# Patient Record
Sex: Female | Born: 1960 | Race: Black or African American | Hispanic: No | Marital: Married | Smoking: Never smoker
Health system: Southern US, Community
[De-identification: ages and names within clinical notes are randomized; demographics above are authoritative.]

## PROBLEM LIST (undated history)

## (undated) DIAGNOSIS — G40909 Epilepsy, unspecified, not intractable, without status epilepticus: Secondary | ICD-10-CM

## (undated) DIAGNOSIS — F29 Unspecified psychosis not due to a substance or known physiological condition: Secondary | ICD-10-CM

## (undated) DIAGNOSIS — R519 Headache, unspecified: Secondary | ICD-10-CM

## (undated) DIAGNOSIS — G4700 Insomnia, unspecified: Secondary | ICD-10-CM

## (undated) DIAGNOSIS — F419 Anxiety disorder, unspecified: Secondary | ICD-10-CM

## (undated) DIAGNOSIS — F24 Shared psychotic disorder: Secondary | ICD-10-CM

## (undated) DIAGNOSIS — I10 Essential (primary) hypertension: Secondary | ICD-10-CM

## (undated) DIAGNOSIS — F411 Generalized anxiety disorder: Secondary | ICD-10-CM

## (undated) DIAGNOSIS — F329 Major depressive disorder, single episode, unspecified: Secondary | ICD-10-CM

## (undated) DIAGNOSIS — R011 Cardiac murmur, unspecified: Secondary | ICD-10-CM

## (undated) DIAGNOSIS — F32A Depression, unspecified: Secondary | ICD-10-CM

## (undated) HISTORY — DX: Depression, unspecified: F32.A

## (undated) HISTORY — DX: Anxiety disorder, unspecified: F41.9

## (undated) HISTORY — DX: Shared psychotic disorder: F24

## (undated) HISTORY — DX: Generalized anxiety disorder: F41.1

## (undated) HISTORY — PX: ABDOMINAL HYSTERECTOMY: SHX81

## (undated) HISTORY — PX: CHOLECYSTECTOMY: SHX55

## (undated) HISTORY — DX: Insomnia, unspecified: G47.00

## (undated) HISTORY — DX: Major depressive disorder, single episode, unspecified: F32.9

## (undated) HISTORY — DX: Unspecified psychosis not due to a substance or known physiological condition: F29

## (undated) HISTORY — DX: Epilepsy, unspecified, not intractable, without status epilepticus: G40.909

---

## 2000-07-25 ENCOUNTER — Other Ambulatory Visit: Admission: RE | Admit: 2000-07-25 | Discharge: 2000-07-25 | Payer: Self-pay | Admitting: Family Medicine

## 2001-10-03 ENCOUNTER — Ambulatory Visit (HOSPITAL_COMMUNITY): Admission: RE | Admit: 2001-10-03 | Discharge: 2001-10-03 | Payer: Self-pay | Admitting: Family Medicine

## 2001-10-03 ENCOUNTER — Encounter: Payer: Self-pay | Admitting: Family Medicine

## 2002-10-09 ENCOUNTER — Ambulatory Visit (HOSPITAL_COMMUNITY): Admission: RE | Admit: 2002-10-09 | Discharge: 2002-10-09 | Payer: Self-pay | Admitting: Family Medicine

## 2002-10-09 ENCOUNTER — Encounter: Payer: Self-pay | Admitting: Family Medicine

## 2003-10-29 ENCOUNTER — Ambulatory Visit (HOSPITAL_COMMUNITY): Admission: RE | Admit: 2003-10-29 | Discharge: 2003-10-29 | Payer: Self-pay | Admitting: Family Medicine

## 2004-01-22 ENCOUNTER — Ambulatory Visit: Payer: Self-pay | Admitting: Family Medicine

## 2004-03-19 ENCOUNTER — Ambulatory Visit: Payer: Self-pay | Admitting: Family Medicine

## 2004-05-20 ENCOUNTER — Ambulatory Visit: Payer: Self-pay | Admitting: Family Medicine

## 2004-08-19 ENCOUNTER — Ambulatory Visit: Payer: Self-pay | Admitting: Family Medicine

## 2004-09-30 ENCOUNTER — Ambulatory Visit: Payer: Self-pay | Admitting: Family Medicine

## 2004-10-30 ENCOUNTER — Ambulatory Visit (HOSPITAL_COMMUNITY): Admission: RE | Admit: 2004-10-30 | Discharge: 2004-10-30 | Payer: Self-pay | Admitting: Family Medicine

## 2004-12-03 ENCOUNTER — Ambulatory Visit: Payer: Self-pay | Admitting: Family Medicine

## 2005-03-11 ENCOUNTER — Ambulatory Visit: Payer: Self-pay | Admitting: Family Medicine

## 2005-04-13 ENCOUNTER — Ambulatory Visit: Payer: Self-pay | Admitting: Family Medicine

## 2005-06-01 ENCOUNTER — Ambulatory Visit: Payer: Self-pay | Admitting: Family Medicine

## 2005-08-02 ENCOUNTER — Ambulatory Visit: Payer: Self-pay | Admitting: Family Medicine

## 2005-09-24 ENCOUNTER — Ambulatory Visit: Payer: Self-pay | Admitting: Family Medicine

## 2005-12-31 ENCOUNTER — Ambulatory Visit: Payer: Self-pay | Admitting: Family Medicine

## 2006-01-06 ENCOUNTER — Ambulatory Visit (HOSPITAL_COMMUNITY): Admission: RE | Admit: 2006-01-06 | Discharge: 2006-01-06 | Payer: Self-pay | Admitting: Family Medicine

## 2006-02-28 ENCOUNTER — Ambulatory Visit: Payer: Self-pay | Admitting: Family Medicine

## 2006-02-28 ENCOUNTER — Other Ambulatory Visit: Admission: RE | Admit: 2006-02-28 | Discharge: 2006-02-28 | Payer: Self-pay | Admitting: Family Medicine

## 2006-02-28 ENCOUNTER — Encounter: Payer: Self-pay | Admitting: Family Medicine

## 2006-02-28 LAB — CONVERTED CEMR LAB
Cholesterol: 145 mg/dL (ref 0–200)
HDL: 46 mg/dL (ref >39)
LDL Cholesterol: 79 mg/dL (ref 0–99)
Pap Smear: NORMAL
Phenobarbital: 30.3 ug/mL (ref 15.0–40.0)
TSH: 1.106 microintl units/mL (ref 0.350–5.50)

## 2006-05-10 ENCOUNTER — Ambulatory Visit: Payer: Self-pay | Admitting: Family Medicine

## 2006-05-10 LAB — CONVERTED CEMR LAB
Phenobarbital: 35.8 ug/mL (ref 15.0–40.0)
Phenytoin Lvl: 26.3 ug/mL — ABNORMAL HIGH (ref 10.0–20.0)

## 2006-06-24 ENCOUNTER — Encounter: Payer: Self-pay | Admitting: Family Medicine

## 2006-06-24 LAB — CONVERTED CEMR LAB
Phenobarbital: 33.1 ug/mL (ref 15.0–40.0)
Phenytoin Lvl: 19.5 ug/mL (ref 10.0–20.0)

## 2006-09-19 ENCOUNTER — Ambulatory Visit: Payer: Self-pay | Admitting: Family Medicine

## 2006-09-19 LAB — CONVERTED CEMR LAB
AST: 23 units/L (ref 0–37)
Alkaline Phosphatase: 128 units/L — ABNORMAL HIGH (ref 39–117)
Basophils Absolute: 0 10*3/uL (ref 0.0–0.1)
Calcium: 8.9 mg/dL (ref 8.4–10.5)
Chloride: 105 meq/L (ref 96–112)
HCT: 42.9 % (ref 36.0–46.0)
Lymphocytes Relative: 43 % (ref 12–46)
Lymphs Abs: 2.4 10*3/uL (ref 0.7–3.3)
MCHC: 33 g/dL (ref 30.0–36.0)
MCV: 99.4 fL (ref 78.0–100.0)
Monocytes Absolute: 0.8 10*3/uL — ABNORMAL HIGH (ref 0.2–0.7)
Monocytes Relative: 14 % — ABNORMAL HIGH (ref 3–11)
Platelets: 378 10*3/uL (ref 150–400)
RBC: 4.31 M/uL (ref 3.87–5.11)
Total Bilirubin: 0.5 mg/dL (ref 0.3–1.2)
Total Protein: 7.4 g/dL (ref 6.0–8.3)
WBC: 5.6 10*3/uL (ref 4.0–10.5)

## 2006-12-20 ENCOUNTER — Ambulatory Visit: Payer: Self-pay | Admitting: Family Medicine

## 2007-01-10 ENCOUNTER — Ambulatory Visit (HOSPITAL_COMMUNITY): Admission: RE | Admit: 2007-01-10 | Discharge: 2007-01-10 | Payer: Self-pay | Admitting: Family Medicine

## 2007-02-21 ENCOUNTER — Encounter: Payer: Self-pay | Admitting: Family Medicine

## 2007-02-21 DIAGNOSIS — G4700 Insomnia, unspecified: Secondary | ICD-10-CM | POA: Insufficient documentation

## 2007-02-21 DIAGNOSIS — R569 Unspecified convulsions: Secondary | ICD-10-CM | POA: Insufficient documentation

## 2007-02-21 DIAGNOSIS — F341 Dysthymic disorder: Secondary | ICD-10-CM

## 2007-02-21 DIAGNOSIS — F39 Unspecified mood [affective] disorder: Secondary | ICD-10-CM | POA: Insufficient documentation

## 2007-02-28 ENCOUNTER — Ambulatory Visit: Payer: Self-pay | Admitting: Family Medicine

## 2007-02-28 LAB — CONVERTED CEMR LAB
Phenytoin Lvl: 24.1 ug/mL — ABNORMAL HIGH (ref 10.0–20.0)
Troponin I: 0.06 ng/mL — ABNORMAL HIGH (ref <0.06)

## 2007-04-11 ENCOUNTER — Ambulatory Visit: Payer: Self-pay | Admitting: Family Medicine

## 2007-04-12 ENCOUNTER — Encounter: Payer: Self-pay | Admitting: Family Medicine

## 2007-04-12 LAB — CONVERTED CEMR LAB
Albumin: 4.2 g/dL (ref 3.5–5.2)
Alkaline Phosphatase: 126 units/L — ABNORMAL HIGH (ref 39–117)
BUN: 8 mg/dL (ref 6–23)
Basophils Absolute: 0.1 10*3/uL (ref 0.0–0.1)
Basophils Relative: 1 % (ref 0–1)
Bilirubin, Direct: <0.1 mg/dL (ref 0.0–0.3)
Chloride: 106 meq/L (ref 96–112)
Cholesterol: 159 mg/dL (ref 0–200)
Creatinine, Ser: 0.47 mg/dL (ref 0.40–1.20)
Eosinophils Absolute: 0.1 10*3/uL (ref 0.0–0.7)
Eosinophils Relative: 1 % (ref 0–5)
LDL Cholesterol: 93 mg/dL (ref 0–99)
MCV: 100.7 fL — ABNORMAL HIGH (ref 78.0–100.0)
Monocytes Absolute: 0.7 10*3/uL (ref 0.1–1.0)
Neutro Abs: 1.9 10*3/uL (ref 1.7–7.7)
Neutrophils Relative %: 37 % — ABNORMAL LOW (ref 43–77)
Phenytoin Lvl: 21.8 ug/mL — ABNORMAL HIGH (ref 10.0–20.0)
Platelets: 414 10*3/uL — ABNORMAL HIGH (ref 150–400)
Potassium: 4.7 meq/L (ref 3.5–5.3)
Total Protein: 7.8 g/dL (ref 6.0–8.3)
Triglycerides: 106 mg/dL (ref <150)
WBC: 5 10*3/uL (ref 4.0–10.5)

## 2007-07-24 ENCOUNTER — Ambulatory Visit: Payer: Self-pay | Admitting: Family Medicine

## 2007-07-28 ENCOUNTER — Ambulatory Visit (HOSPITAL_COMMUNITY): Admission: RE | Admit: 2007-07-28 | Discharge: 2007-07-28 | Payer: Self-pay | Admitting: Family Medicine

## 2007-09-04 ENCOUNTER — Ambulatory Visit: Payer: Self-pay | Admitting: Family Medicine

## 2007-09-04 ENCOUNTER — Encounter: Payer: Self-pay | Admitting: Family Medicine

## 2007-09-04 DIAGNOSIS — J301 Allergic rhinitis due to pollen: Secondary | ICD-10-CM

## 2007-11-06 ENCOUNTER — Encounter: Payer: Self-pay | Admitting: Family Medicine

## 2007-12-05 ENCOUNTER — Ambulatory Visit: Payer: Self-pay | Admitting: Family Medicine

## 2007-12-06 ENCOUNTER — Telehealth: Payer: Self-pay | Admitting: Family Medicine

## 2007-12-08 ENCOUNTER — Encounter: Payer: Self-pay | Admitting: Family Medicine

## 2007-12-08 LAB — CONVERTED CEMR LAB: Phenytoin Lvl: 13.9 ug/mL (ref 10.0–20.0)

## 2008-01-03 ENCOUNTER — Encounter: Payer: Self-pay | Admitting: Family Medicine

## 2008-01-12 ENCOUNTER — Ambulatory Visit (HOSPITAL_COMMUNITY): Admission: RE | Admit: 2008-01-12 | Discharge: 2008-01-12 | Payer: Self-pay | Admitting: Family Medicine

## 2008-02-13 ENCOUNTER — Ambulatory Visit: Payer: Self-pay | Admitting: Family Medicine

## 2008-02-13 ENCOUNTER — Encounter: Payer: Self-pay | Admitting: Family Medicine

## 2008-02-13 ENCOUNTER — Other Ambulatory Visit: Admission: RE | Admit: 2008-02-13 | Discharge: 2008-02-13 | Payer: Self-pay | Admitting: Family Medicine

## 2008-02-13 DIAGNOSIS — H547 Unspecified visual loss: Secondary | ICD-10-CM | POA: Insufficient documentation

## 2008-02-21 ENCOUNTER — Encounter: Payer: Self-pay | Admitting: Family Medicine

## 2008-04-01 ENCOUNTER — Telehealth: Payer: Self-pay | Admitting: Family Medicine

## 2008-04-04 ENCOUNTER — Encounter: Payer: Self-pay | Admitting: Family Medicine

## 2008-05-06 ENCOUNTER — Ambulatory Visit: Payer: Self-pay | Admitting: Family Medicine

## 2008-05-06 DIAGNOSIS — F319 Bipolar disorder, unspecified: Secondary | ICD-10-CM | POA: Insufficient documentation

## 2008-05-08 ENCOUNTER — Encounter: Payer: Self-pay | Admitting: Family Medicine

## 2008-05-08 LAB — CONVERTED CEMR LAB
Ketones, ur: NEGATIVE mg/dL
Leukocytes, UA: NEGATIVE
Nitrite: NEGATIVE
Protein, ur: NEGATIVE mg/dL
Specific Gravity, Urine: 1.014 (ref 1.005–1.030)
Urobilinogen, UA: 1 (ref 0.0–1.0)
WBC, UA: NONE SEEN cells/hpf (ref <3)

## 2008-05-22 ENCOUNTER — Encounter: Payer: Self-pay | Admitting: Family Medicine

## 2008-05-22 ENCOUNTER — Telehealth: Payer: Self-pay | Admitting: Family Medicine

## 2008-05-23 LAB — CONVERTED CEMR LAB
Basophils Absolute: 0.1 10*3/uL (ref 0.0–0.1)
Basophils Relative: 1 % (ref 0–1)
CO2: 27 meq/L (ref 19–32)
Calcium: 9.5 mg/dL (ref 8.4–10.5)
Eosinophils Absolute: 0.1 10*3/uL (ref 0.0–0.7)
Glucose, Bld: 87 mg/dL (ref 70–99)
LDL Cholesterol: 103 mg/dL — ABNORMAL HIGH (ref 0–99)
MCHC: 32.7 g/dL (ref 30.0–36.0)
MCV: 94.5 fL (ref 78.0–100.0)
Monocytes Absolute: 0.7 10*3/uL (ref 0.1–1.0)
Neutrophils Relative %: 32 % — ABNORMAL LOW (ref 43–77)
Phenobarbital: 35.5 ug/mL (ref 15.0–40.0)
Potassium: 4.8 meq/L (ref 3.5–5.3)
RDW: 13.6 % (ref 11.5–15.5)
Triglycerides: 102 mg/dL (ref <150)
VLDL: 20 mg/dL (ref 0–40)

## 2008-05-28 LAB — CONVERTED CEMR LAB: Phenytoin Lvl: 9.3 ug/mL — ABNORMAL LOW (ref 10.0–20.0)

## 2008-06-17 ENCOUNTER — Ambulatory Visit: Payer: Self-pay | Admitting: Family Medicine

## 2008-06-19 LAB — CONVERTED CEMR LAB: Phenytoin Lvl: 25.6 ug/mL — ABNORMAL HIGH (ref 10.0–20.0)

## 2008-08-29 ENCOUNTER — Telehealth: Payer: Self-pay | Admitting: Family Medicine

## 2008-10-18 ENCOUNTER — Ambulatory Visit: Payer: Self-pay | Admitting: Family Medicine

## 2008-10-28 ENCOUNTER — Encounter: Payer: Self-pay | Admitting: Internal Medicine

## 2008-11-01 ENCOUNTER — Encounter: Payer: Self-pay | Admitting: Family Medicine

## 2008-11-07 ENCOUNTER — Ambulatory Visit: Payer: Self-pay | Admitting: Internal Medicine

## 2008-11-07 ENCOUNTER — Ambulatory Visit (HOSPITAL_COMMUNITY): Admission: RE | Admit: 2008-11-07 | Discharge: 2008-11-07 | Payer: Self-pay | Admitting: Internal Medicine

## 2008-11-07 ENCOUNTER — Encounter: Payer: Self-pay | Admitting: Internal Medicine

## 2008-11-10 ENCOUNTER — Encounter: Payer: Self-pay | Admitting: Internal Medicine

## 2008-11-18 ENCOUNTER — Encounter: Payer: Self-pay | Admitting: Family Medicine

## 2008-12-30 ENCOUNTER — Telehealth: Payer: Self-pay | Admitting: Family Medicine

## 2009-01-13 ENCOUNTER — Ambulatory Visit (HOSPITAL_COMMUNITY): Admission: RE | Admit: 2009-01-13 | Discharge: 2009-01-13 | Payer: Self-pay | Admitting: Family Medicine

## 2009-03-03 ENCOUNTER — Ambulatory Visit: Payer: Self-pay | Admitting: Family Medicine

## 2009-03-04 LAB — CONVERTED CEMR LAB: Phenytoin Lvl: 35.4 ug/mL (ref 10.0–20.0)

## 2009-03-26 ENCOUNTER — Encounter: Payer: Self-pay | Admitting: Family Medicine

## 2009-03-26 LAB — CONVERTED CEMR LAB: Phenytoin Lvl: 26 ug/mL — ABNORMAL HIGH (ref 10.0–20.0)

## 2009-03-28 LAB — CONVERTED CEMR LAB: Phenytoin Lvl: 11.9 ug/mL (ref 10.0–20.0)

## 2009-07-08 ENCOUNTER — Ambulatory Visit: Payer: Self-pay | Admitting: Family Medicine

## 2009-07-08 DIAGNOSIS — F329 Major depressive disorder, single episode, unspecified: Secondary | ICD-10-CM

## 2009-07-08 DIAGNOSIS — F419 Anxiety disorder, unspecified: Secondary | ICD-10-CM | POA: Insufficient documentation

## 2009-07-10 ENCOUNTER — Telehealth: Payer: Self-pay | Admitting: Family Medicine

## 2009-07-10 LAB — CONVERTED CEMR LAB: Phenytoin Lvl: 9 ug/mL — ABNORMAL LOW (ref 10.0–20.0)

## 2009-07-22 ENCOUNTER — Telehealth: Payer: Self-pay | Admitting: Family Medicine

## 2009-07-29 ENCOUNTER — Encounter: Payer: Self-pay | Admitting: Family Medicine

## 2009-07-31 ENCOUNTER — Ambulatory Visit: Payer: Self-pay | Admitting: Family Medicine

## 2009-08-05 LAB — CONVERTED CEMR LAB: Phenytoin Lvl: 4.7 ug/mL — ABNORMAL LOW (ref 10.0–20.0)

## 2009-08-11 LAB — CONVERTED CEMR LAB: Phenytoin Lvl: 12.1 ug/mL (ref 10.0–20.0)

## 2009-08-26 ENCOUNTER — Ambulatory Visit: Payer: Self-pay | Admitting: Family Medicine

## 2009-08-29 LAB — CONVERTED CEMR LAB
Phenobarbital: 30.6 ug/mL (ref 15.0–40.0)
Phenytoin Lvl: 20.6 ug/mL — ABNORMAL HIGH (ref 10.0–20.0)

## 2009-12-10 ENCOUNTER — Ambulatory Visit: Payer: Self-pay | Admitting: Family Medicine

## 2009-12-10 DIAGNOSIS — R51 Headache: Secondary | ICD-10-CM

## 2009-12-10 DIAGNOSIS — R519 Headache, unspecified: Secondary | ICD-10-CM | POA: Insufficient documentation

## 2009-12-11 LAB — CONVERTED CEMR LAB
BUN: 5 mg/dL — ABNORMAL LOW (ref 6–23)
Basophils Absolute: 0 10*3/uL (ref 0.0–0.1)
Calcium: 9.7 mg/dL (ref 8.4–10.5)
Chloride: 99 meq/L (ref 96–112)
Glucose, Bld: 96 mg/dL (ref 70–99)
Hemoglobin: 13.6 g/dL (ref 12.0–15.0)
Lymphocytes Relative: 53 % — ABNORMAL HIGH (ref 12–46)
Lymphs Abs: 2.5 10*3/uL (ref 0.7–4.0)
Monocytes Absolute: 0.6 10*3/uL (ref 0.1–1.0)
Monocytes Relative: 13 % — ABNORMAL HIGH (ref 3–12)
Neutro Abs: 1.6 10*3/uL — ABNORMAL LOW (ref 1.7–7.7)
Neutrophils Relative %: 33 % — ABNORMAL LOW (ref 43–77)
Phenobarbital: 42.7 ug/mL — ABNORMAL HIGH (ref 15.0–40.0)
Platelets: 340 10*3/uL (ref 150–400)
Potassium: 4 meq/L (ref 3.5–5.3)
Triglycerides: 98 mg/dL (ref <150)
WBC: 4.7 10*3/uL (ref 4.0–10.5)

## 2009-12-15 ENCOUNTER — Encounter: Payer: Self-pay | Admitting: Family Medicine

## 2009-12-15 LAB — CONVERTED CEMR LAB: Phenytoin Lvl: 12.7 ug/mL (ref 10.0–20.0)

## 2010-01-16 ENCOUNTER — Ambulatory Visit (HOSPITAL_COMMUNITY)
Admission: RE | Admit: 2010-01-16 | Discharge: 2010-01-16 | Payer: Self-pay | Source: Home / Self Care | Attending: Family Medicine | Admitting: Family Medicine

## 2010-03-05 NOTE — Assessment & Plan Note (Signed)
Summary: office visit   Vital Signs:  Patient profile:   50 year old female Menstrual status:  hysterectomy Height:      63 inches Weight:      123 pounds BMI:     21.87 O2 Sat:      97 % on Room air Pulse rate:   92 / minute Resp:     16 per minute BP sitting:   122 / 80  (left arm)  Vitals Entered By: Mauricia Area CMA (December 10, 2009 2:29 PM)  O2 Flow:  Room air CC: Headache for one week. Tylenol helps sometimes   CC:  Headache for one week. Tylenol helps sometimes.  History of Present Illness: Reports  that she has been doing fairly well. She does report a significantamt of stress because of her sons. Denies recent fever or chills. Denies sinus pressure, nasal congestion , ear pain or sore throat. Denies chest congestion, or cough productive of sputum. Denies chest pain, palpitations, PND, orthopnea or leg swelling. Denies abdominal pain, nausea, vomitting, diarrhea or constipation. Denies change in bowel movements or bloody stool. Denies dysuria , frequency, incontinence or hesitancy. Denies  joint pain, swelling, or reduced mobility. c/o headache and seizures, none recently, she denies vertigo  Denies uncontrolled depression, anxiety or insomnia. Denies  rash, lesions, or itch.     Current Medications (verified): 1)  Clonazepam 0.5 Mg  Tabs (Clonazepam) .... Take One Tablet By Mouth Three Times A Day 2)  Phenobarbital 100 Mg  Tabs (Phenobarbital) .... One Tab By Mouth Two Times A Day 3)  Zyprexa 5 Mg  Tabs (Olanzapine) .... One Tab By Mouth At Bedtime 4)  Cyclobenzaprine Hcl 10 Mg Tabs (Cyclobenzaprine Hcl) .... Take One Tab By Mouth At Bedtime 5)  Dilantin 100 Mg Caps (Phenytoin Sodium Extended) .... One Capsule in The Morning and 2 Capsules At Night  Allergies (verified): 1)  ! Asa  Review of Systems      See HPI General:  Complains of fatigue. Eyes:  Denies discharge, eye pain, and red eye. MS:  Complains of joint pain and stiffness; elbows and  shoulders. Neuro:  Complains of headaches. Psych:  Complains of anxiety and mental problems; denies suicidal thoughts/plans, thoughts of violence, and unusual visions or sounds. Endo:  Denies cold intolerance, excessive hunger, excessive thirst, and excessive urination. Heme:  Denies abnormal bruising and bleeding. Allergy:  Complains of seasonal allergies.  Physical Exam  General:  Well-developed,well-nourished,in no acute distress; alert,appropriate and cooperative throughout examination HEENT: No facial asymmetry,  EOMI, No sinus tenderness, TM's Clear, oropharynx  pink and moist.   Chest: Clear to auscultation bilaterally.  CVS: S1, S2, No murmurs, No S3.   Abd: Soft, Nontender.  MS: Adequate ROM spine, hips, shoulders and knees.  Ext: No edema.   CNS: CN 2-12 intact, power tone and sensation normal throughout.   Skin: Intact, no visible lesions or rashes.  Psych: Good eye contact, normal affect.  Memory intact, not anxious or depressed appearing.    Impression & Recommendations:  Problem # 1:  HEADACHE (ICD-784.0) Assessment Deteriorated  Her updated medication list for this problem includes:    Tylenol Extra Strength 500 Mg Tabs (Acetaminophen) .Marland Kitchen... Take 1 tablet by mouth two times a day as needed for severe headache ,max 6 tabs per week  Orders: ASA 325mg  tab (EMRORAL)  Problem # 2:  DEPRESSION, MILD (ICD-311) Assessment: Unchanged  Her updated medication list for this problem includes:    Clonazepam 0.5 Mg  Tabs (Clonazepam) .Marland Kitchen... Take one tablet by mouth three times a day  Problem # 3:  INSOMNIA UNSPECIFIED (ICD-780.52) Assessment: Improved  Her updated medication list for this problem includes:    Phenobarbital 100 Mg Tabs (Phenobarbital) ..... One tab by mouth two times a day  Discussed sleep hygiene.   Problem # 4:  ANXIETY DEPRESSION (ICD-300.4) Assessment: Unchanged  Orders: Medicare Electronic Prescription [phone])  Problem # 5:  SEIZURE DISORDER  (ICD-780.39) Assessment: Unchanged  Her updated medication list for this problem includes:    Clonazepam 0.5 Mg Tabs (Clonazepam) .Marland Kitchen... Take one tablet by mouth three times a day    Phenobarbital 100 Mg Tabs (Phenobarbital) ..... One tab by mouth two times a day    Dilantin 100 Mg Caps (Phenytoin sodium extended) ..... One capsule in the morning and 2 capsules at night  Orders: Medicare Electronic Prescription [phone]) T-Dilantin (Phenytoin) [phone]) T-Phenobarbital ([phone])  Complete Medication List: 1)  Clonazepam 0.5 Mg Tabs (Clonazepam) .... Take one tablet by mouth three times a day 2)  Phenobarbital 100 Mg Tabs (Phenobarbital) .... One tab by mouth two times a day 3)  Zyprexa 5 Mg Tabs (Olanzapine) .... One tab by mouth at bedtime 4)  Cyclobenzaprine Hcl 10 Mg Tabs (Cyclobenzaprine hcl) .... Take one tab by mouth at bedtime 5)  Dilantin 100 Mg Caps (Phenytoin sodium extended) .... One capsule in the morning and 2 capsules at night 6)  Flonase 50 Mcg/act Susp (Fluticasone propionate) .... 2 puffs twice daily for allergies 7)  Tylenol Extra Strength 500 Mg Tabs (Acetaminophen) .... Take 1 tablet by mouth two times a day as needed for severe headache ,max 6 tabs per week  Other Orders: Influenza Vaccine MCR ([id number]) T-Basic Metabolic Panel [phone]) T-Lipid Profile [phone]) T-CBC w/Diff [phone]) T-TSH [phone]) T-Vitamin D (25-Hydroxy) [phone]) Radiology Referral (Radiology)  Patient Instructions: 1)  CPE January 15 or after. 2)  no med changes at this time 3)  BMP prior to visit, ICD-9: 4)  Lipid Panel prior to visit, ICD-9:  today 5)  TSH prior to visit, ICD-9: 6)  CBC w/ Diff prior to visit, ICD-9: 7)  dilantin level, phenobarbital levels , not stat , vit d 8)  you are referred for a mamogram. Prescriptions: PHENOBARBITAL 100 MG  TABS (PHENOBARBITAL) one tab by mouth two times a day  #60 x 3   Entered by:   Adella Hare LPN   Authorized  by:   Syliva Overman MD   Signed by:   Adella Hare LPN on 31/51/7616   Method used:   Printed then faxed to ...       [facility] [address] (retail)       [address]       [place], [place]  [address]       Ph: [phone] or [phone]       Fax: [phone]   RxID:   [id number] CLONAZEPAM 0.5 MG  TABS (CLONAZEPAM) Take one tablet by mouth three times a day  #90 x 3   Entered by:   Adella Hare LPN   Authorized by:   Syliva Overman MD   Signed by:   Adella Hare LPN on 52/77/8242   Method used:   Printed then faxed to ...       [facility] [address] (retail)       [address]       [place], [place]  [address]       Ph: [phone] or [phone]       Fax: [phone]   RxID:   [id number] DILANTIN 100 MG CAPS (PHENYTOIN SODIUM EXTENDED) one capsule in the morning and 2 capsules at night  #90 x 3   Entered by:   Adella Hare LPN   Authorized by:   Syliva Overman MD   Signed by:   Adella Hare LPN on 47/42/5956   Method used:   Electronically to        [facility] [address] (retail)       [address]       [place], [place]  [address]       Ph: [phone] or [phone]       Fax: [phone]   RxID:   [id number] CYCLOBENZAPRINE HCL 10 MG TABS (CYCLOBENZAPRINE HCL) Take one tab by mouth at bedtime  #30 x 3   Entered by:   Adella Hare LPN   Authorized by:   Syliva Overman MD   Signed by:   Adella Hare LPN on 31/51/7616   Method used:   Electronically to        [facility] [address] (retail)       [address]       [place], [place]  [address]       Ph: [phone] or [phone]       Fax: [phone]   RxID:   [id number] ZYPREXA 5 MG  TABS (OLANZAPINE) one tab by mouth at bedtime  #30 x 3   Entered by:   Adella Hare LPN   Authorized by:   Syliva Overman MD   Signed by:   Adella Hare LPN on  52/77/8242   Method used:   Electronically to        [facility] [address] (retail)       [address]       [place], [place]  [address]       Ph: [phone] or [phone]       Fax: [phone]   RxID:   [id number] TYLENOL EXTRA STRENGTH 500 MG TABS (ACETAMINOPHEN) Take 1 tablet by mouth two times a day as needed for severe headache ,max 6 tabs per week  #30 x 1   Entered and Authorized by:   Syliva Overman MD   Signed by:   Syliva Overman MD on 12/10/2009   Method used:   Electronically to        [facility] [address] (retail)       [address]       [place], [place]  [address]       Ph: [phone] or [phone]       Fax: [phone]   RxID:   [id number] FLONASE 50 MCG/ACT SUSP (FLUTICASONE PROPIONATE) 2 puffs twice daily for allergies  #1 x 2   Entered and Authorized by:   Syliva Overman MD   Signed by:   Syliva Overman MD on 12/10/2009   Method used:   Electronically to        [facility] [address] (retail)       [address]       [place], [place]  [address]       Ph: [phone] or [phone]       Fax: [phone]   RxID:   [id number]    Medication Administration  Medication # 1:    Medication: ASA 325mg  tab    Diagnosis: HEADACHE (ICD-784.0)    Dose: 2 tablets    Route: po    Exp Date: 10/12    Lot #: [id number]    Mfr: gemini pharm    Patient tolerated medication without complications    Given by: Adella Hare LPN (December 10, 2009 3:16 PM)  Orders Added: 1)  Influenza Vaccine MCR [00025] 2)  Est. Patient Level IV [60630] 3)  Medicare Electronic Prescription [G8553] 4)  T-Basic Metabolic Panel [80048-22910] 5)  T-Lipid Profile [80061-22930] 6)  T-CBC w/Diff [16010-93235] 7)  T-TSH [57322-02542] 8)  T-Dilantin (Phenytoin) [70623-76283] 9)  T-Phenobarbital [15176-16073] 10)  T-Vitamin D (25-Hydroxy) [71062-69485] 11)  ASA  325mg  tab [EMRORAL] 12)  Radiology Referral [Radiology]   Immunizations Administered:  Influenza Vaccine:    Vaccine Type: Fluvax MCR    Site: right deltoid    Mfr: novartis    Dose: 0.5 ml    Route: IM    Given by: Mauricia Area CMA    Exp. Date: 06/2010    Lot #: [id number] 5p    VIS given: 08/26/09 version given December 10, 2009.   Immunizations Administered:  Influenza Vaccine:    Vaccine Type: Fluvax MCR    Site: right deltoid    Mfr: novartis    Dose: 0.5 ml    Route: IM    Given by: Mauricia Area CMA    Exp. Date: 06/2010    Lot #: [id number] 5p    VIS given: 08/26/09 version given December 10, 2009.     Medication Administration  Medication # 1:    Medication: ASA 325mg  tab    Diagnosis: HEADACHE (ICD-784.0)    Dose: 2 tablets    Route: po    Exp Date: 10/12    Lot #: [id number]    Mfr: gemini pharm    Patient tolerated medication without complications    Given by: Adella Hare LPN (December 10, 2009 3:16 PM)  Orders Added: 1)  Influenza Vaccine MCR [00025] 2)  Est. Patient Level IV [35009] 3)  Medicare Electronic Prescription [G8553] 4)  T-Basic Metabolic Panel [80048-22910] 5)  T-Lipid Profile [80061-22930] 6)  T-CBC w/Diff [38182-99371] 7)  T-TSH [69678-93810] 8)  T-Dilantin (Phenytoin) [17510-25852] 9)  T-Phenobarbital [77824-23536] 10)  T-Vitamin D (25-Hydroxy) [14431-54008] 11)  ASA 325mg  tab [EMRORAL] 12)  Radiology Referral [Radiology]   Appended Document: office visit correction, ASA not given in office  Tylenol 325mg  was given

## 2010-03-05 NOTE — Miscellaneous (Signed)
Summary: ins card  ins card   Imported By: Lind Guest 07/31/2009 16:05:57  _____________________________________________________________________  External Attachment:    Type:   Image     Comment:   External Document

## 2010-03-05 NOTE — Assessment & Plan Note (Signed)
Summary: office visit   Vital Signs:  Patient profile:   50 year old female Menstrual status:  hysterectomy Height:      63 inches Weight:      120 pounds BMI:     21.33 O2 Sat:      97 % Pulse rate:   99 / minute Pulse rhythm:   regular Resp:     16 per minute BP sitting:   110 / 84 Cuff size:   regular  Vitals Entered By: Everitt Amber LPN (July 08, 4429 3:55 PM) CC: has been feeling lightheaded lately    CC:  has been feeling lightheaded lately .  History of Present Illness: Staggering and imbalance x 1 week. Pt reports continued breakthrough seizures which she always states occur in her sleeop. She denies any recent fever oir chills, head or chest congestion, sore throat or productive cough. She dsenies dysuria or frequency. 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 Shedenies being suicidal or homicidal, she denies hallucinations, she reports poor apetitie. Shedenies any laxcerations assoxiated wih seizures, or increased muscle aches or joint pain.   Current Medications (verified): 1)  Clonazepam 0.5 Mg  Tabs (Clonazepam) .... Take One Tablet By Mouth Three Times A Day 2)  Phenobarbital 100 Mg  Tabs (Phenobarbital) .... One Tab By Mouth Two Times A Day 3)  Dilantin 100 Mg  Caps (Phenytoin Sodium Extended) .... One Cap Every Morning and Two Caps Qhs 4)  Zyprexa 5 Mg  Tabs (Olanzapine) .... One Tab By Mouth At Bedtime 5)  Flonase 50 Mcg/act  Susp (Fluticasone Propionate) .... 2 Puffs Per Nostril Daily 6)  Cyclobenzaprine Hcl 10 Mg Tabs (Cyclobenzaprine Hcl) .... Take One Tab By Mouth At Bedtime  Allergies (verified): 1)  ! Asa  Review of Systems      See HPI Eyes:  Denies blurring and discharge. CV:  told her bp was [place] the [place]. GI:  Denies abdominal pain, constipation, diarrhea, nausea, and vomiting. Neuro:  Complains of headaches, poor balance, and  seizures; denies sensation of room spinning, tingling, and tremors. Psych:  Complains of anxiety and depression; denies suicidal thoughts/plans, thoughts of violence, and unusual visions or sounds. Endo:  Denies cold intolerance, excessive hunger, excessive thirst, excessive urination, heat intolerance, polyuria, and weight change. Heme:  Denies abnormal bruising and bleeding. Allergy:  Denies hives or rash and itching eyes.  Physical Exam  General:  alert, adequately -nourished, and well-hydrated. HEENT: No facial asymmetry,  EOMI, No sinus tenderness, TM's Clear, oropharynx  pink and moist.   Chest: Clear to auscultation bilaterally.  CVS: S1, S2, No murmurs, No S3.   Abd: Soft, Nontender.  MS: Adequate ROM spine, hips, shoulders and knees.  Ext: No edema.   CNS: CN 2-12 intact, power tone and sensation normal throughout.   Skin: Intact, no visible lesions or rashes.  Psych: Good eye contact, normal affect.  Memory intact, mildly  anxious not depressed appearing.      Impression & Recommendations:  Problem # 1:  DEPRESSION, MILD (ICD-311) Assessment Deteriorated  Her updated medication list for this problem includes:    Clonazepam 0.5 Mg Tabs (Clonazepam) .Marland Kitchen... Take one tablet by mouth three times a day    Fluoxetine Hcl 10 Mg Caps (Fluoxetine hcl) .Marland Kitchen... Take 1 capsule by mouth once a day  Problem # 2:  INSOMNIA UNSPECIFIED (ICD-780.52) Assessment: Unchanged  Her  updated medication list for this problem includes:    Phenobarbital 100 Mg Tabs (Phenobarbital) ..... One tab by mouth two times a day  Problem # 3:  SEIZURE DISORDER (ICD-780.39) Assessment: Unchanged  The following medications were removed from the medication list:    Dilantin 100 Mg Caps (Phenytoin sodium extended) ..... One cap every morning and two caps qhs Her updated medication list for this problem includes:    Clonazepam 0.5 Mg Tabs (Clonazepam) .Marland Kitchen... Take one tablet by mouth three times a day     Phenobarbital 100 Mg Tabs (Phenobarbital) ..... One tab by mouth two times a day    Dilantin 100 Mg Caps (Phenytoin sodium extended) .Marland Kitchen..Marland Kitchen Two capsules twice daily  Orders: T-Dilantin (Phenytoin) ([id number]) T-Phenobarbital ([id number])  Problem # 4:  ANXIETY DEPRESSION (ICD-300.4) Assessment: Deteriorated  Complete Medication List: 1)  Clonazepam 0.5 Mg Tabs (Clonazepam) .... Take one tablet by mouth three times a day 2)  Phenobarbital 100 Mg Tabs (Phenobarbital) .... One tab by mouth two times a day 3)  Zyprexa 5 Mg Tabs (Olanzapine) .... One tab by mouth at bedtime 4)  Flonase 50 Mcg/act Susp (Fluticasone propionate) .... 2 puffs per nostril daily 5)  Cyclobenzaprine Hcl 10 Mg Tabs (Cyclobenzaprine hcl) .... Take one tab by mouth at bedtime 6)  Fluoxetine Hcl 10 Mg Caps (Fluoxetine hcl) .... Take 1 capsule by mouth once a day 7)  Dilantin 100 Mg Caps (Phenytoin sodium extended) .... Two capsules twice daily  Patient Instructions: 1)  Please schedule a follow-up appointment in 6 weeks. 2)  Dilantin and phenobarb level today. 3)  You will be started on med for depression Prescriptions: DILANTIN 100 MG CAPS (PHENYTOIN SODIUM EXTENDED) two capsules twice daily  #120 x 3   Entered and Authorized by:   Syliva Overman MD   Signed by:   Syliva Overman MD on 07/09/2009   Method used:   Printed then faxed to ...       [facility] [address] (retail)       [address]       [place], [place]  [address]       Ph: [phone] or [phone]       Fax: [phone]   RxID:   [id number] FLUOXETINE HCL 10 MG CAPS (FLUOXETINE HCL) Take 1 capsule by mouth once a day  #30 x 2   Entered and Authorized by:   Syliva Overman MD   Signed by:   Syliva Overman MD on 07/08/2009   Method used:   Electronically to        [facility] [address] (retail)       [address]       [place], [place]  [address]       Ph:  [phone] or [phone]       Fax: [phone]   RxID:   [id number]

## 2010-03-05 NOTE — Letter (Signed)
Summary: Discharge Summary  Discharge Summary   Imported By: Lind Guest 08/18/2009 14:18:36  _____________________________________________________________________  External Attachment:    Type:   Image     Comment:   External Document

## 2010-03-05 NOTE — Miscellaneous (Signed)
Summary: Orders Update  Clinical Lists Changes  Orders: Added new Test order of T-Dilantin (Phenytoin) ([id number]) - Signed 

## 2010-03-05 NOTE — Assessment & Plan Note (Signed)
Summary: HOSPITAL FOLLOW UP- ROOM 1   Vital Signs:  Patient profile:   50 year old female Menstrual status:  hysterectomy Height:      63 inches Weight:      119.75 pounds BMI:     21.29 O2 Sat:      98 % on Room air Pulse rate:   93 / minute Resp:     16 per minute BP sitting:   120 / 90  (left arm)  Vitals Entered By: Adella Hare LPN (July 31, 2009 2:35 PM) CC: HOSPITAL FOLLOW UP Is Patient Diabetic? No Pain Assessment Patient in pain? no        CC:  HOSPITAL FOLLOW UP.  History of Present Illness: Pt is here for hospital f/u.  She went to [facility] ER Monday 6/27  with nausea, loss of appetite, and dizzines. She  was admitted with Dilantin toxicity .  Pt states she was not taking more Dilantin than rxd.  That her dose was increased the beginning of the mos. She was discharged yesterday.  She is currently not taking Dilantin.  She had labs drawn this am to recheck levels. She states she is feeling better.  Appetitie is nl.  No nausea or vomiting.  No HA or dizziness.  Her muscles were sore yesterday but feel better today. She has not had a seizure recently, and specifically since dischg.   Current Medications (verified): 1)  Clonazepam 0.5 Mg  Tabs (Clonazepam) .... Take One Tablet By Mouth Three Times A Day 2)  Phenobarbital 100 Mg  Tabs (Phenobarbital) .... One Tab By Mouth Two Times A Day 3)  Zyprexa 5 Mg  Tabs (Olanzapine) .... One Tab By Mouth At Bedtime 4)  Flonase 50 Mcg/act  Susp (Fluticasone Propionate) .... 2 Puffs Per Nostril Daily 5)  Cyclobenzaprine Hcl 10 Mg Tabs (Cyclobenzaprine Hcl) .... Take One Tab By Mouth At Bedtime 6)  Dilantin 100 Mg Caps (Phenytoin Sodium Extended) .... Two Capsules Twice Daily 7)  Paroxetine Hcl 10 Mg Tabs (Paroxetine Hcl) .... Take 1 Tablet By Mouth Once A Day  Allergies (verified): 1)  ! Jonne Ply  Past History:  Past medical history reviewed for relevance to current acute and chronic problems.  Past Medical History: Reviewed  history from 02/21/2007 and no changes required. 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 MS:  Denies joint pain, muscle aches, and cramps. Neuro:  Denies numbness, seizures, and tingling.  Physical Exam  General:  Well-developed,well-nourished,in no acute distress; alert,appropriate and cooperative throughout examination Head:  Normocephalic and atraumatic without obvious abnormalities. No apparent alopecia or balding. Ears:  External ear exam shows no significant lesions or deformities.  Otoscopic examination reveals clear canals, tympanic membranes are intact bilaterally without bulging, retraction, inflammation or discharge. Hearing is grossly normal bilaterally. Nose:  External nasal examination shows no deformity or inflammation. Nasal mucosa are pink and moist without lesions or exudates. Mouth:  Oral mucosa and oropharynx without lesions or exudates.   Neck:  No deformities, masses, or tenderness noted. Lungs:  Normal respiratory effort, chest expands symmetrically. Lungs are clear to auscultation, no crackles or wheezes. Heart:  Normal rate and regular rhythm. S1 and S2 normal without gallop, murmur, click, rub or other extra sounds. Cervical Nodes:  No lymphadenopathy noted Psych:  flat affect and subdued.     Impression &  Recommendations:  Problem # 1:  SEIZURE DISORDER (ICD-780.39) Assessment Comment Only  Recent admission for Dilantin toxicity.  Discussed case with Dr Lodema Hong.  Will hold Dilantin until Sat July 2nd, then start one daily.  To have STAT labs drawn Tue July 5th.  Her updated medication list for this problem includes:    Clonazepam 0.5 Mg Tabs (Clonazepam) .Marland Kitchen... Take one tablet by mouth three times a day     Phenobarbital 100 Mg Tabs (Phenobarbital) ..... One tab by mouth two times a day    Dilantin 100 Mg Caps (Phenytoin sodium extended) .Marland Kitchen..Marland Kitchen Two capsules twice daily  Future Orders: T-Dilantin (Phenytoin) ([id number]) ... 08/05/2009  Problem # 2:  BIPOLAR DISORDER UNSPECIFIED (ICD-296.80) Assessment: Comment Only Continue current meds as prescribed.  Complete Medication List: 1)  Clonazepam 0.5 Mg Tabs (Clonazepam) .... Take one tablet by mouth three times a day 2)  Phenobarbital 100 Mg Tabs (Phenobarbital) .... One tab by mouth two times a day 3)  Zyprexa 5 Mg Tabs (Olanzapine) .... One tab by mouth at bedtime 4)  Cyclobenzaprine Hcl 10 Mg Tabs (Cyclobenzaprine hcl) .... Take one tab by mouth at bedtime 5)  Dilantin 100 Mg Caps (Phenytoin sodium extended) .... Two capsules twice daily 6)  Paroxetine Hcl 10 Mg Tabs (Paroxetine hcl) .... Take 1 tablet by mouth once a day  Patient Instructions: 1)  Schedule a follow up appt with Dr Lodema Hong in 4 weeks. 2)  DO NOT TAKE ANY DILATIN UNTIL SATURDAY JULY 2ND.  THEN TAKE ONE DAILY. 3)  HAVE YOUR BLOOD DRAWN TUES MORNING JULY 5TH.  4)  WE WILL THEN ADVISE YOU HOW TO TAKE THE DILANTIN AFTER WE RECEIVE YOUR LAB RESULTS

## 2010-03-05 NOTE — Miscellaneous (Signed)
Summary: Orders Update  Clinical Lists Changes  Orders: Added new Test order of T-Dilantin (Phenytoin) [phone]) - Signed

## 2010-03-05 NOTE — Progress Notes (Signed)
Summary: med making her sick  Phone Note Call from Patient   Summary of Call: pt needs to speak with nurse about medicine that is making her sick. [phone] Initial call taken by: Rudene Anda,  July 22, 2009 8:29 AM  Follow-up for Phone Call        states fluoxetine making her nausea, dizziness, and dry mouth patient wants to discontinue med and try somthing else Follow-up by: Adella Hare LPN,  July 22, 2009 10:39 AM  Additional Follow-up for Phone Call Additional follow up Details #1::        pls advise pt to d/c the med, i will tery one other antidepressant, if she cannot tolerate that she will need psychiatry to treat her depression, pls ensure she understands  Pls call pharmacy and check if paxil is scored and can be broken before faxing she is prescribed half daily i need to know if it cannot be broken Additional Follow-up by: Syliva Overman MD,  July 22, 2009 12:14 PM    Additional Follow-up for Phone Call Additional follow up Details #2::    cannot be broken, controlled release Follow-up by: Adella Hare LPN,  July 22, 2009 4:29 PM  Additional Follow-up for Phone Call Additional follow up Details #3:: Details for Additional Follow-up Action Taken: advise her to take  one daily, pls fax new script with the new directions, cancel other if itwas sent, but i do not think that it was.  pLS write d/c fluoxetine on the new script and ensure pt understands not to take both Additional Follow-up by: Syliva Overman MD,  July 22, 2009 4:52 PM  New/Updated Medications: PAROXETINE HCL 10 MG TABS (PAROXETINE HCL) Takeone half  tablet by mouth once a day' PAROXETINE HCL 10 MG TABS (PAROXETINE HCL) Take 1 tablet by mouth once a day Prescriptions: PAROXETINE HCL 10 MG TABS (PAROXETINE HCL) Take 1 tablet by mouth once a day  #30 x 1   Entered and Authorized by:   Syliva Overman MD   Signed by:   Syliva Overman MD on 07/22/2009   Method used:   Printed then faxed to ...  [facility] [address] (retail)       [address]       [place], [place]  [address]       Ph: [phone] or [phone]       Fax: [phone]   RxID:   [id number] PAROXETINE HCL 10 MG TABS (PAROXETINE HCL) Takeone half  tablet by mouth once a day'  #15 x 1   Entered and Authorized by:   Syliva Overman MD   Signed by:   Syliva Overman MD on 07/22/2009   Method used:   Printed then faxed to ...       [facility] [address] (retail)       [address]       [place], [place]  [address]       Ph: [phone] or [phone]       Fax: [phone]   RxID:   [id number]  called patient, left message Adella Hare LPN  July 22, 2009 4:58 PM called patient, left message Adella Hare LPN  July 23, 2009 10:54 AM called patient, left message, mailed letter Adella Hare LPN  July 24, 2009 8:25 AM  Appended Document: med making her sick patient aware

## 2010-03-05 NOTE — Assessment & Plan Note (Signed)
Summary: office visit   Vital Signs:  Patient profile:   50 year old female Menstrual status:  hysterectomy Height:      63 inches Weight:      127 pounds BMI:     22.58 O2 Sat:      96 % Pulse rate:   64 / minute Pulse rhythm:   regular Resp:     16 per minute BP sitting:   120 / 80 Cuff size:   regular  Vitals Entered ByMarland Kitchen Everitt Amber (March 03, 2009 3:37 PM) CC: Follow up chronic problems   CC:  Follow up chronic problems.  History of Present Illness: Reports  that she has been doing well. Denies recent fever or chills. Denies sinus pressure, nasal congestion , ear pain or sore throat. Denies chest congestion, or cough productive of sputum. Denies chest pain, palpitations, PND, orthopnea or leg swelling. Denies abdominal pain, nausea, vomitting, diarrhea or constipation. Denies change in bowel movements or bloody stool. Denies dysuria , frequency, incontinence or hesitancy. Denies  joint pain, swelling, or reduced mobility. Denies headaches, vertigo, reports seizures approx  approx 2 to4  per month mainly in her sleep, reports mainly an absence type seizure. Denies depression, anxiety or insomnia. Denies  rash, lesions, or itch.     Current Medications (verified): 1)  Clonazepam 0.5 Mg  Tabs (Clonazepam) .... Take One Tablet By Mouth Three Times A Day 2)  Phenobarbital 100 Mg  Tabs (Phenobarbital) .... One Tab By Mouth Two Times A Day 3)  Dilantin 100 Mg  Caps (Phenytoin Sodium Extended) .... One Cap Every Morning and Two Caps Qhs 4)  Zyprexa 5 Mg  Tabs (Olanzapine) .... One Tab By Mouth At Bedtime 5)  Flonase 50 Mcg/act  Susp (Fluticasone Propionate) .... 2 Puffs Per Nostril Daily 6)  Cyclobenzaprine Hcl 10 Mg Tabs (Cyclobenzaprine Hcl) .... Take One Tab By Mouth At Bedtime  Allergies (verified): 1)  ! Asa  Review of Systems      See HPI Eyes:  Denies blurring and discharge. Neuro:  Complains of seizures; denies headaches and sensation of room  spinning. Heme:  Denies abnormal bruising and bleeding. Allergy:  Denies hives or rash and sneezing.  Physical Exam  General:  alert, well-nourished, and well-hydrated. HEENT: No facial asymmetry,  EOMI, No sinus tenderness, TM's Clear, oropharynx  pink and moist.   Chest: Clear to auscultation bilaterally.  CVS: S1, S2, No murmurs, No S3.   Abd: Soft, Nontender.  MS: Adequate ROM spine, hips, shoulders and knees.  Ext: No edema.   CNS: CN 2-12 intact, power tone and sensation normal throughout.   Skin: Intact, no visible lesions or rashes.  Psych: Good eye contact, normal affect.  Memory intact, not anxious or depressed appearing.      Impression & Recommendations:  Problem # 1:  BIPOLAR DISORDER UNSPECIFIED (ICD-296.80) Assessment Improved continue zyprexia  Problem # 2:  SEIZURE DISORDER (ICD-780.39) Assessment: Unchanged  Her updated medication list for this problem includes:    Clonazepam 0.5 Mg Tabs (Clonazepam) .Marland Kitchen... Take one tablet by mouth three times a day    Phenobarbital 100 Mg Tabs (Phenobarbital) ..... One tab by mouth two times a day    Dilantin 100 Mg Caps (Phenytoin sodium extended) ..... One cap every morning and two caps qhs  Orders: T-Dilantin (Phenytoin) ([id number]) T-Phenobarbital ([id number])  Problem # 3:  ANXIETY DEPRESSION (ICD-300.4) Assessment: Improved  Complete Medication List: 1)  Clonazepam 0.5 Mg Tabs (Clonazepam) .... Take one tablet by  mouth three times a day 2)  Phenobarbital 100 Mg Tabs (Phenobarbital) .... One tab by mouth two times a day 3)  Dilantin 100 Mg Caps (Phenytoin sodium extended) .... One cap every morning and two caps qhs 4)  Zyprexa 5 Mg Tabs (Olanzapine) .... One tab by mouth at bedtime 5)  Flonase 50 Mcg/act Susp (Fluticasone propionate) .... 2 puffs per nostril daily 6)  Cyclobenzaprine Hcl 10 Mg Tabs (Cyclobenzaprine hcl) .... Take one tab by mouth at bedtime  Other Orders: Pneumococcal Vaccine ([address]) Admin  1st Vaccine ([address])  Patient Instructions: 1)  Please schedule a follow-up appointment in 4 months. 2)  Dilantin and phenobarb levels today, NOT stat. 3)  no med changes at this time Prescriptions: DILANTIN 100 MG  CAPS (PHENYTOIN SODIUM EXTENDED) one cap every morning and two caps qhs  #90 x 4   Entered by:   Worthy Keeler LPN   Authorized by:   Syliva Overman MD   Signed by:   Worthy Keeler LPN on 09/81/1914   Method used:   Printed then faxed to ...       [facility] [address] (retail)       [address]       [place], [place]  [address]       Ph: [phone] or [phone]       Fax: [phone]   RxID:   [id number] PHENOBARBITAL 100 MG  TABS (PHENOBARBITAL) one tab by mouth two times a day  #60 x 4   Entered by:   Worthy Keeler LPN   Authorized by:   Syliva Overman MD   Signed by:   Worthy Keeler LPN on 63/87/5643   Method used:   Printed then faxed to ...       [facility] [address] (retail)       [address]       [place], [place]  [address]       Ph: [phone] or [phone]       Fax: [phone]   RxID:   [id number] CLONAZEPAM 0.5 MG  TABS (CLONAZEPAM) Take one tablet by mouth three times a day  #90 x 4   Entered by:   Worthy Keeler LPN   Authorized by:   Syliva Overman MD   Signed by:   Worthy Keeler LPN on 60/73/7106   Method used:   Printed then faxed to ...       [facility] [address] (retail)       [address]       [place], [place]  [address]       Ph: [phone] or [phone]       Fax: [phone]   RxID:   [id number]    Immunizations Administered:  Pneumonia Vaccine:    Vaccine Type: Pneumovax    Site: left deltoid    Mfr: Merck    Dose: 0.5 ml    Route: IM    Given by: Worthy Keeler LPN    Exp. Date: 01/11/2010    Lot #: [id number]    VIS given: 08/30/95 version given March 03, 2009.

## 2010-03-05 NOTE — Progress Notes (Signed)
Summary: medicine  Phone Note Call from Patient   Summary of Call: wants you to call her about the medicine and was a message left  call back at [phone] asap Initial call taken by: Lind Guest,  July 10, 2009 9:20 AM  Follow-up for Phone Call        patient aware of the dilantin changes and that it was sent to the pharmacy  Follow-up by: Everitt Amber LPN,  July 10, 1476 11:59 AM

## 2010-03-05 NOTE — Assessment & Plan Note (Signed)
Summary: office visit   Vital Signs:  Patient profile:   50 year old female Menstrual status:  hysterectomy Height:      63 inches Weight:      119 pounds BMI:     21.16 O2 Sat:      98 % Pulse rate:   89 / minute Pulse rhythm:   regular Resp:     16 per minute BP sitting:   120 / 84  (left arm) Cuff size:   regular  Vitals Entered By: Everitt Amber LPN (August 26, 2009 2:27 PM) CC: Follow up chronic problems   CC:  Follow up chronic problems.  History of Present Illness: Reports  that she has been  doing well. Denies recent fever or chills. Denies sinus pressure, nasal congestion , ear pain or sore throat. Denies chest congestion, or cough productive of sputum. Denies chest pain, palpitations, PND, orthopnea or leg swelling. Denies abdominal pain, nausea, vomitting, diarrhea or constipation. Denies change in bowel movements or bloody stool. Denies dysuria , frequency, incontinence or hesitancy. Denies  joint pain, swelling, or reduced mobility. Denies headachesor vertigo,seizures currently controlled on meds, and no symptoms of toxicity.  Denies  rash, lesions, or itch.     Current Medications (verified): 1)  Clonazepam 0.5 Mg  Tabs (Clonazepam) .... Take One Tablet By Mouth Three Times A Day 2)  Phenobarbital 100 Mg  Tabs (Phenobarbital) .... One Tab By Mouth Two Times A Day 3)  Zyprexa 5 Mg  Tabs (Olanzapine) .... One Tab By Mouth At Bedtime 4)  Cyclobenzaprine Hcl 10 Mg Tabs (Cyclobenzaprine Hcl) .... Take One Tab By Mouth At Bedtime 5)  Dilantin 100 Mg Caps (Phenytoin Sodium Extended) .... Two Capsules Twice Daily 6)  Paroxetine Hcl 10 Mg Tabs (Paroxetine Hcl) .... Take 1 Tablet By Mouth Once A Day  Allergies (verified): 1)  ! Asa  Review of Systems      See HPI General:  Complains of fatigue. Eyes:  Denies blurring and discharge. Neuro:  Complains of seizures; needs drug levels checked. Psych:  Complains of anxiety and mental problems; denies depression;  reports that paxil made her feekl weird so she has not been taking it. Endo:  Denies cold intolerance, excessive thirst, excessive urination, and heat intolerance. Heme:  Denies abnormal bruising and bleeding. Allergy:  Complains of seasonal allergies; denies hives or rash and itching eyes.  Physical Exam  General:  Well-developed,well-nourished,in no acute distress; alert,appropriate and cooperative throughout examination HEENT: No facial asymmetry,  EOMI, No sinus tenderness, TM's Clear, oropharynx  pink and moist.   Chest: Clear to auscultation bilaterally.  CVS: S1, S2, No murmurs, No S3.   Abd: Soft, Nontender.  MS: Adequate ROM spine, hips, shoulders and knees.  Ext: No edema.   CNS: CN 2-12 intact, power tone and sensation normal throughout.   Skin: Intact, no visible lesions or rashes.  Psych: Good eye contact, normal affect.  Memory intact, not anxious or depressed appearing.    Impression & Recommendations:  Problem # 1:  DEPRESSION, MILD (ICD-311) Assessment Improved  The following medications were removed from the medication list:    Paroxetine Hcl 10 Mg Tabs (Paroxetine hcl) .Marland Kitchen... Take 1 tablet by mouth once a day Her updated medication list for this problem includes:    Clonazepam 0.5 Mg Tabs (Clonazepam) .Marland Kitchen... Take one tablet by mouth three times a day  Problem # 2:  BIPOLAR DISORDER UNSPECIFIED (ICD-296.80) Assessment: Unchanged continue zyprexia  Problem # 3:  SEIZURE DISORDER (  ICD-780.39) Assessment: Unchanged  Her updated medication list for this problem includes:    Clonazepam 0.5 Mg Tabs (Clonazepam) .Marland Kitchen... Take one tablet by mouth three times a day    Phenobarbital 100 Mg Tabs (Phenobarbital) ..... One tab by mouth two times a day    Dilantin 100 Mg Caps (Phenytoin sodium extended) .Marland Kitchen..Marland Kitchen Two capsules twice daily  Orders: T-Dilantin (Phenytoin) ([id number]) T-Phenobarbital ([id number])  Complete Medication List: 1)  Clonazepam 0.5 Mg Tabs  (Clonazepam) .... Take one tablet by mouth three times a day 2)  Phenobarbital 100 Mg Tabs (Phenobarbital) .... One tab by mouth two times a day 3)  Zyprexa 5 Mg Tabs (Olanzapine) .... One tab by mouth at bedtime 4)  Cyclobenzaprine Hcl 10 Mg Tabs (Cyclobenzaprine hcl) .... Take one tab by mouth at bedtime 5)  Dilantin 100 Mg Caps (Phenytoin sodium extended) .... Two capsules twice daily  Patient Instructions: 1)  Please schedule a follow-up appointment in 3 .5months. 2)  Dilantin and phenobarb levels stat today pls then every month if normal. 3)  Pls start organizing your pills once every week. 4)  Stop Paxil Prescriptions: DILANTIN 100 MG CAPS (PHENYTOIN SODIUM EXTENDED) two capsules twice daily  #120 x 3   Entered by:   Everitt Amber LPN   Authorized by:   Syliva Overman MD   Signed by:   Everitt Amber LPN on 30/86/5784   Method used:   Printed then faxed to ...       [facility] [address] (retail)       [address]       [place], [place]  [address]       Ph: [phone] or [phone]       Fax: [phone]   RxID:   [id number] CYCLOBENZAPRINE HCL 10 MG TABS (CYCLOBENZAPRINE HCL) Take one tab by mouth at bedtime  #30 x 3   Entered by:   Everitt Amber LPN   Authorized by:   Syliva Overman MD   Signed by:   Everitt Amber LPN on 16/60/6301   Method used:   Printed then faxed to ...       [facility] [address] (retail)       [address]       [place], [place]  [address]       Ph: [phone] or [phone]       Fax: [phone]   RxID:   [id number] ZYPREXA 5 MG  TABS (OLANZAPINE) one tab by mouth at bedtime  #30 Each x 3   Entered by:   Everitt Amber LPN   Authorized by:   Syliva Overman MD   Signed by:   Everitt Amber LPN on 27/04/5007   Method used:   Printed then faxed to ...       [facility] [address] (retail)       [address]       [place], [place]   [address]       Ph: [phone] or [phone]       Fax: [phone]   RxID:   [id number] PHENOBARBITAL 100 MG  TABS (PHENOBARBITAL) one tab by mouth two times a day  #60 x 3   Entered by:   Everitt Amber LPN   Authorized by:   Syliva Overman MD   Signed by:   Merry Proud  Hudy LPN on 57/84/6962   Method used:   Printed then faxed to ...       [facility] [address] (retail)       [address]       [place], [place]  [address]       Ph: [phone] or [phone]       Fax: [phone]   RxID:   [id number] CLONAZEPAM 0.5 MG  TABS (CLONAZEPAM) Take one tablet by mouth three times a day  #90 x 3   Entered by:   Everitt Amber LPN   Authorized by:   Syliva Overman MD   Signed by:   Everitt Amber LPN on 63/02/6008   Method used:   Printed then faxed to ...       [facility] [address] (retail)       [address]       [place], [place]  [address]       Ph: [phone] or [phone]       Fax: [phone]   RxID:   [id number]

## 2010-03-18 ENCOUNTER — Encounter: Payer: Self-pay | Admitting: Family Medicine

## 2010-03-18 ENCOUNTER — Other Ambulatory Visit: Payer: Self-pay | Admitting: Family Medicine

## 2010-03-18 ENCOUNTER — Other Ambulatory Visit (HOSPITAL_COMMUNITY)
Admission: RE | Admit: 2010-03-18 | Discharge: 2010-03-18 | Disposition: A | Discharge status: Home / Self Care | Payer: Medicare Other | Source: Ambulatory Visit | Attending: Family Medicine | Admitting: Family Medicine

## 2010-03-18 ENCOUNTER — Encounter (INDEPENDENT_AMBULATORY_CARE_PROVIDER_SITE_OTHER): Payer: Medicare Other | Admitting: Family Medicine

## 2010-03-18 DIAGNOSIS — R569 Unspecified convulsions: Secondary | ICD-10-CM

## 2010-03-18 DIAGNOSIS — Z0000 Encounter for general adult medical examination without abnormal findings: Secondary | ICD-10-CM

## 2010-03-18 DIAGNOSIS — Z124 Encounter for screening for malignant neoplasm of cervix: Secondary | ICD-10-CM

## 2010-03-18 DIAGNOSIS — Z1211 Encounter for screening for malignant neoplasm of colon: Secondary | ICD-10-CM

## 2010-03-19 LAB — CONVERTED CEMR LAB: Phenytoin Lvl: 15.5 ug/mL (ref 10.0–20.0)

## 2010-03-20 ENCOUNTER — Encounter: Payer: Self-pay | Admitting: Family Medicine

## 2010-03-25 NOTE — Letter (Signed)
Summary: Pap Smear, Normal Letter, [facility]  [address]   [place], [place] [address]   Phone: [phone]  Fax: [phone]          March 20, 2010    Dear: Sandra Riley    I am pleased to notify you that your PAP smear was normal.  You will need your next PAP smear in:     ____ 3 Months    ____ 6 Months    ____ 12 Months    Please call the office at our office number above, to schedule your next appointment.    Sincerely,     [facility]

## 2010-03-31 NOTE — Assessment & Plan Note (Signed)
Summary: physical   Vital Signs:  Patient profile:   50 year old female Menstrual status:  hysterectomy Height:      63 inches Weight:      123.50 pounds BMI:     21.96 O2 Sat:      97 % Pulse rate:   94 / minute Resp:     16 per minute BP sitting:   120 / 80  Vitals Entered By: Everitt Amber LPN (March 18, 2010 9:15 AM) CC: CPE   Vision Screening:Left eye with correction: 20 / 40 Right eye with correction: 20 / 50 Both eyes with correction: 20 / 30  Color vision testing: normal     Vision Comments: misplaced her newer glasses  Vision Entered By: Everitt Amber LPN (March 18, 2010 9:21 AM)   CC:  CPE .  History of Present Illness: Reports  that she is doing fairly well. Denies recent fever or chills. Denies sinus pressure, nasal congestion , ear pain or sore throat. Denies chest congestion, or cough productive of sputum. Denies chest pain, palpitations, PND, orthopnea or leg swelling. Denies abdominal pain, nausea, vomitting, diarrhea or constipation. Denies change in bowel movements or bloody stool. Denies dysuria , frequency, incontinence or hesitancy. Denies  joint pain, swelling, or reduced mobility. Denies headaches, vertigo, seizures. reports increased stress, primarily due to  her  financial situation Denies  rash, lesions, or itch.      Current Medications (verified): 1)  Clonazepam 0.5 Mg  Tabs (Clonazepam) .... Take One Tablet By Mouth Three Times A Day 2)  Phenobarbital 100 Mg  Tabs (Phenobarbital) .... One Tab By Mouth Two Times A Day 3)  Zyprexa 5 Mg  Tabs (Olanzapine) .... One Tab By Mouth At Bedtime 4)  Cyclobenzaprine Hcl 10 Mg Tabs (Cyclobenzaprine Hcl) .... Take One Tab By Mouth At Bedtime 5)  Dilantin 100 Mg Caps (Phenytoin Sodium Extended) .... One Capsule in The Morning and 2 Capsules At Night 6)  Flonase 50 Mcg/act Susp (Fluticasone Propionate) .... 2 Puffs Twice Daily For Allergies 7)  Tylenol Extra Strength 500 Mg Tabs (Acetaminophen)  .... Take 1 Tablet By Mouth Two Times A Day As Needed For Severe Headache ,max 6 Tabs Per Week  Allergies (verified): 1)  ! Asa  Review of Systems      See HPI General:  Complains of fatigue. Eyes:  Denies discharge and red eye. Neuro:  Complains of seizures; no recent seizure activityreported. Psych:  Complains of anxiety, depression, and mental problems; denies suicidal thoughts/plans, thoughts of violence, and unusual visions or sounds. Endo:  Denies cold intolerance, excessive hunger, excessive thirst, and excessive urination. Heme:  Denies abnormal bruising, bleeding, and fevers. Allergy:  Complains of seasonal allergies; denies hives or rash and itching eyes; mild.  Physical Exam  General:  Well-developed,well-nourished,in no acute distress; alert,appropriate and cooperative throughout examination Head:  Normocephalic and atraumatic without obvious abnormalities. No apparent alopecia or balding. Eyes:  vision grossly intact, pupils equal, pupils round, and pupils reactive to light.   Ears:  External ear exam shows no significant lesions or deformities.  Otoscopic examination reveals clear canals, tympanic membranes are intact bilaterally without bulging, retraction, inflammation or discharge. Hearing is grossly normal bilaterally. Nose:  External nasal examination shows no deformity or inflammation. Nasal mucosa are pink and moist without lesions or exudates. Mouth:  pharynx pink and moist, fair dentition, and teeth missing.   Neck:  No deformities, masses, or tenderness noted. Chest Wall:  No deformities, masses, or tenderness  noted. Breasts:  No mass, nodules, thickening, tenderness, bulging, retraction, inflamation, nipple discharge or skin changes noted.   Lungs:  Normal respiratory effort, chest expands symmetrically. Lungs are clear to auscultation, no crackles or wheezes. Heart:  Normal rate and regular rhythm. S1 and S2 normal without gallop, murmur, click, rub or other extra  sounds. Abdomen:  Bowel sounds positive,abdomen soft and non-tender without masses, organomegaly or hernias noted. Rectal:  No external abnormalities noted. Normal sphincter tone. No rectal masses or tenderness. Genitalia:  normal introitus, no external lesions, no vaginal discharge, and no adnexal masses or tenderness.Uterus absent   Msk:  No deformity or scoliosis noted of thoracic or lumbar spine.   Pulses:  R and L carotid,radial,femoral,dorsalis pedis and posterior tibial pulses are full and equal bilaterally Extremities:  No clubbing, cyanosis, edema, or deformity noted with normal full range of motion of all joints.   Neurologic:  No cranial nerve deficits noted. Station and gait are normal. Plantar reflexes are down-going bilaterally. DTRs are symmetrical throughout. Sensory, motor and coordinative functions appear intact. Skin:  Intact without suspicious lesions or rashes Cervical Nodes:  No lymphadenopathy noted Axillary Nodes:  No palpable lymphadenopathy Inguinal Nodes:  No significant adenopathy Psych:  Cognition and judgment appear intact. Alert and cooperative with normal attention span and concentration. No apparent delusions, illusions, hallucinations   Impression & Recommendations:  Problem # 1:  DEPRESSION, MILD (ICD-311) Assessment Unchanged  Her updated medication list for this problem includes:    Clonazepam 0.5 Mg Tabs (Clonazepam) .Marland Kitchen... Take one tablet by mouth three times a day  Problem # 2:  SEIZURE DISORDER (ICD-780.39) Assessment: Improved  Her updated medication list for this problem includes:    Clonazepam 0.5 Mg Tabs (Clonazepam) .Marland Kitchen... Take one tablet by mouth three times a day    Phenobarbital 100 Mg Tabs (Phenobarbital) ..... One tab by mouth two times a day    Dilantin 100 Mg Caps (Phenytoin sodium extended) ..... One capsule in the morning and 2 capsules at night  Orders: Medicare Electronic Prescription [phone]) T-Dilantin (Phenytoin)  [phone]) T-Phenobarbital ([id number])  Problem # 3:  INSOMNIA UNSPECIFIED (ICD-780.52) Assessment: Improved  Her updated medication list for this problem includes:    Phenobarbital 100 Mg Tabs (Phenobarbital) ..... One tab by mouth two times a day  Discussed sleep hygiene.   Problem # 4:  PHYSICAL EXAMINATION (ICD-V70.0) pap sent, rectal exam revealed no mass and giuaic neg stool  Problem # 5:  BIPOLAR DISORDER UNSPECIFIED (ICD-296.80) Assessment: Unchanged continue zyprexia  Problem # 6:  ALLERGIC RHINITIS, SEASONAL (ICD-477.0) Assessment: Improved flonase as needed  Complete Medication List: 1)  Clonazepam 0.5 Mg Tabs (Clonazepam) .... Take one tablet by mouth three times a day 2)  Phenobarbital 100 Mg Tabs (Phenobarbital) .... One tab by mouth two times a day 3)  Zyprexa 5 Mg Tabs (Olanzapine) .... One tab by mouth at bedtime 4)  Cyclobenzaprine Hcl 10 Mg Tabs (Cyclobenzaprine hcl) .... Take one tab by mouth at bedtime 5)  Dilantin 100 Mg Caps (Phenytoin sodium extended) .... One capsule in the morning and 2 capsules at night 6)  Flonase 50 Mcg/act Susp (Fluticasone propionate) .... 2 puffs twice daily for allergies 7)  Tylenol Extra Strength 500 Mg Tabs (Acetaminophen) .... Take 1 tablet by mouth two times a day as needed for severe headache ,max 6 tabs per week  Other Orders: Hemoccult Guaiac-1 spec.(in office) ([id number]) Pap Smear ([id number])  Patient Instructions: 1)  Please schedule a follow-up appointment in 4  months. 2)  Phenobarb and dilantin levels today. 3)  All the best, no med changes.   Orders Added: 1)  Est. Patient 40-64 years [[id number]] 2)  Medicare Electronic Prescription [[id number]] 3)  T-Dilantin (Phenytoin) [[id number]] 4)  T-Phenobarbital [[id number]] 5)  Hemoccult Guaiac-1 spec.(in office) [[id number]] 6)  Pap Smear [[id number]]     Laboratory Results  Date/Time Received: March 18, 2010 9:42 AM  Date/Time Reported: March 18, 2010 9:42  AM   Stool - Occult Blood Hemmoccult #1: negative Date: 03/18/2010 Comments: 51301 13L 10/13 5030 5/14 Adella Hare LPN  March 18, 2010 9:43 AM

## 2010-04-24 ENCOUNTER — Telehealth: Payer: Self-pay | Admitting: Family Medicine

## 2010-04-24 NOTE — Telephone Encounter (Signed)
pls advise pt she needs to have pharmacy send refill request, also refill x 4 pls

## 2010-04-24 NOTE — Telephone Encounter (Signed)
Nursing has sent in meds

## 2010-04-27 ENCOUNTER — Encounter: Payer: Self-pay | Admitting: Family Medicine

## 2010-06-01 ENCOUNTER — Telehealth: Payer: Self-pay | Admitting: Family Medicine

## 2010-06-01 ENCOUNTER — Telehealth (INDEPENDENT_AMBULATORY_CARE_PROVIDER_SITE_OTHER): Payer: Medicare Other | Admitting: Family Medicine

## 2010-06-01 DIAGNOSIS — R569 Unspecified convulsions: Secondary | ICD-10-CM

## 2010-06-01 MED ORDER — PHENYTOIN SODIUM EXTENDED 100 MG PO CAPS: 100.0000 mg | ORAL_CAPSULE | Freq: Three times a day (TID) | ORAL | Status: DC

## 2010-06-01 NOTE — Telephone Encounter (Signed)
Med sent as requested 

## 2010-06-01 NOTE — Telephone Encounter (Signed)
Form completed, pls scan and call her to collect

## 2010-06-02 NOTE — Telephone Encounter (Signed)
Patient aware and will pick up the paper

## 2010-07-15 ENCOUNTER — Encounter: Payer: Self-pay | Admitting: Family Medicine

## 2010-07-17 ENCOUNTER — Encounter: Payer: Self-pay | Admitting: Family Medicine

## 2010-07-21 ENCOUNTER — Ambulatory Visit: Payer: Medicare Other | Admitting: Family Medicine

## 2010-07-31 ENCOUNTER — Other Ambulatory Visit: Payer: Self-pay

## 2010-07-31 MED ORDER — PHENYTOIN SODIUM EXTENDED 100 MG PO CAPS: 100.0000 mg | ORAL_CAPSULE | Freq: Three times a day (TID) | ORAL | Status: DC

## 2010-08-25 ENCOUNTER — Other Ambulatory Visit: Payer: Self-pay

## 2010-08-25 MED ORDER — CLONAZEPAM 0.5 MG PO TABS: ORAL_TABLET | ORAL | Status: DC

## 2010-08-31 ENCOUNTER — Telehealth: Payer: Self-pay | Admitting: Family Medicine

## 2010-08-31 MED ORDER — PHENOBARBITAL 100 MG PO TABS: 100.0000 mg | ORAL_TABLET | Freq: Two times a day (BID) | ORAL | Status: DC

## 2010-08-31 NOTE — Telephone Encounter (Signed)
Pt called, she is out of phenobarbital, has history of epilepsy on 2 seizure meds, discussed with her she needs to call during business hours for this , will refill today, next appt Aug 13th

## 2010-09-14 ENCOUNTER — Encounter: Payer: Self-pay | Admitting: Family Medicine

## 2010-09-14 ENCOUNTER — Encounter: Payer: Self-pay | Admitting: *Deleted

## 2010-09-14 ENCOUNTER — Ambulatory Visit (INDEPENDENT_AMBULATORY_CARE_PROVIDER_SITE_OTHER): Payer: Medicare Other | Admitting: Family Medicine

## 2010-09-14 VITALS — BP 120/80 | HR 97 | Resp 14 | Ht 63.0 in | Wt 112.1 lb

## 2010-09-14 DIAGNOSIS — Z23 Encounter for immunization: Secondary | ICD-10-CM

## 2010-09-14 DIAGNOSIS — J301 Allergic rhinitis due to pollen: Secondary | ICD-10-CM

## 2010-09-14 DIAGNOSIS — F329 Major depressive disorder, single episode, unspecified: Secondary | ICD-10-CM

## 2010-09-14 DIAGNOSIS — R569 Unspecified convulsions: Secondary | ICD-10-CM

## 2010-09-14 DIAGNOSIS — F341 Dysthymic disorder: Secondary | ICD-10-CM

## 2010-09-14 MED ORDER — OLANZAPINE 5 MG PO TABS: 5.0000 mg | ORAL_TABLET | Freq: Every day | ORAL | Status: DC

## 2010-09-14 MED ORDER — PHENOBARBITAL 100 MG PO TABS: 100.0000 mg | ORAL_TABLET | Freq: Two times a day (BID) | ORAL | Status: DC

## 2010-09-14 MED ORDER — CYCLOBENZAPRINE HCL 10 MG PO TABS: 10.0000 mg | ORAL_TABLET | Freq: Every day | ORAL | Status: DC

## 2010-09-14 MED ORDER — PHENYTOIN SODIUM EXTENDED 100 MG PO CAPS: 100.0000 mg | ORAL_CAPSULE | Freq: Three times a day (TID) | ORAL | Status: DC

## 2010-09-14 NOTE — Patient Instructions (Addendum)
F/U in 3 months.  Lab today.   TdaP today.  Your blood pressure is good.  I hope you feel less anxious and stressed soon, and remember you can call mental health for therapy if you feel the need to,
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
## 2010-09-15 LAB — PHENYTOIN LEVEL, TOTAL: Phenytoin Lvl: 16.6 ug/mL (ref 10.0–20.0)

## 2010-09-15 LAB — PHENOBARBITAL LEVEL: Phenobarbital: 41.4 ug/mL — ABNORMAL HIGH (ref 15.0–40.0)

## 2010-09-21 NOTE — Assessment & Plan Note (Signed)
Increased and uncontrolled due to psychosocial stress, no med change at this time

## 2010-09-21 NOTE — Assessment & Plan Note (Signed)
Uncontrolled, due to recent increased personal stress

## 2010-09-21 NOTE — Assessment & Plan Note (Signed)
Resume flonase as symptoms are uncontrolled currently

## 2010-09-21 NOTE — Assessment & Plan Note (Signed)
Deteriorated, advised to call mental health if needed

## 2010-10-28 ENCOUNTER — Other Ambulatory Visit: Payer: Self-pay | Admitting: *Deleted

## 2010-10-28 ENCOUNTER — Telehealth: Payer: Self-pay | Admitting: Family Medicine

## 2010-10-28 MED ORDER — CLONAZEPAM 0.5 MG PO TABS: ORAL_TABLET | ORAL | Status: DC

## 2010-10-28 NOTE — Telephone Encounter (Signed)
MEDICATION REFILL SENT

## 2010-12-14 ENCOUNTER — Encounter: Payer: Self-pay | Admitting: Family Medicine

## 2010-12-15 ENCOUNTER — Other Ambulatory Visit: Payer: Self-pay | Admitting: Family Medicine

## 2010-12-15 ENCOUNTER — Ambulatory Visit (INDEPENDENT_AMBULATORY_CARE_PROVIDER_SITE_OTHER): Payer: Medicare Other | Admitting: Family Medicine

## 2010-12-15 ENCOUNTER — Encounter: Payer: Self-pay | Admitting: Family Medicine

## 2010-12-15 VITALS — BP 130/82 | HR 76 | Resp 16 | Ht 63.0 in | Wt 112.0 lb

## 2010-12-15 DIAGNOSIS — R5381 Other malaise: Secondary | ICD-10-CM

## 2010-12-15 DIAGNOSIS — F319 Bipolar disorder, unspecified: Secondary | ICD-10-CM

## 2010-12-15 DIAGNOSIS — F329 Major depressive disorder, single episode, unspecified: Secondary | ICD-10-CM

## 2010-12-15 DIAGNOSIS — R569 Unspecified convulsions: Secondary | ICD-10-CM

## 2010-12-15 DIAGNOSIS — J301 Allergic rhinitis due to pollen: Secondary | ICD-10-CM

## 2010-12-15 DIAGNOSIS — G40909 Epilepsy, unspecified, not intractable, without status epilepticus: Secondary | ICD-10-CM

## 2010-12-15 DIAGNOSIS — M949 Disorder of cartilage, unspecified: Secondary | ICD-10-CM

## 2010-12-15 DIAGNOSIS — E559 Vitamin D deficiency, unspecified: Secondary | ICD-10-CM

## 2010-12-15 DIAGNOSIS — R5383 Other fatigue: Secondary | ICD-10-CM

## 2010-12-15 DIAGNOSIS — F341 Dysthymic disorder: Secondary | ICD-10-CM

## 2010-12-15 DIAGNOSIS — M899 Disorder of bone, unspecified: Secondary | ICD-10-CM

## 2010-12-15 DIAGNOSIS — Z79899 Other long term (current) drug therapy: Secondary | ICD-10-CM

## 2010-12-15 DIAGNOSIS — Z139 Encounter for screening, unspecified: Secondary | ICD-10-CM

## 2010-12-15 DIAGNOSIS — G4700 Insomnia, unspecified: Secondary | ICD-10-CM

## 2010-12-15 DIAGNOSIS — Z23 Encounter for immunization: Secondary | ICD-10-CM

## 2010-12-15 NOTE — Patient Instructions (Addendum)
F/u  In 4 .5 months.  Mammogram will be scheduled   Labs today.  Your weight is healthy  Flu vaccine today
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
## 2010-12-16 LAB — LIPID PANEL
HDL: 46 mg/dL (ref >39)
Total CHOL/HDL Ratio: 3.5 Ratio
Triglycerides: 82 mg/dL (ref <150)

## 2010-12-16 LAB — CBC WITH DIFFERENTIAL/PLATELET
Basophils Absolute: 0 10*3/uL (ref 0.0–0.1)
Eosinophils Absolute: 0 10*3/uL (ref 0.0–0.7)
Eosinophils Relative: 1 % (ref 0–5)
Lymphocytes Relative: 47 % — ABNORMAL HIGH (ref 12–46)
Lymphs Abs: 2.1 10*3/uL (ref 0.7–4.0)
MCV: 99.8 fL (ref 78.0–100.0)
Neutrophils Relative %: 38 % — ABNORMAL LOW (ref 43–77)
Platelets: 331 10*3/uL (ref 150–400)
RBC: 4.22 MIL/uL (ref 3.87–5.11)
RDW: 12.5 % (ref 11.5–15.5)
WBC: 4.4 10*3/uL (ref 4.0–10.5)

## 2010-12-16 LAB — BASIC METABOLIC PANEL
Calcium: 9.8 mg/dL (ref 8.4–10.5)
Chloride: 101 mEq/L (ref 96–112)
Creat: 0.55 mg/dL (ref 0.50–1.10)
Sodium: 138 mEq/L (ref 135–145)

## 2010-12-16 LAB — PHENOBARBITAL LEVEL: Phenobarbital: 34.3 ug/mL (ref 15.0–40.0)

## 2010-12-16 MED ORDER — VITAMIN D (ERGOCALCIFEROL) 1.25 MG (50000 UNIT) PO CAPS: 50000.0000 [IU] | ORAL_CAPSULE | ORAL | Status: DC

## 2010-12-20 DIAGNOSIS — E559 Vitamin D deficiency, unspecified: Secondary | ICD-10-CM | POA: Insufficient documentation

## 2010-12-20 NOTE — Assessment & Plan Note (Signed)
Continue med as before

## 2010-12-20 NOTE — Assessment & Plan Note (Signed)
Uncontrolled by history, will check drug levels in the blood

## 2010-12-20 NOTE — Assessment & Plan Note (Signed)
Reports increased symptoms which is not uncommon during the fall, flonase prescribed

## 2010-12-20 NOTE — Assessment & Plan Note (Signed)
Prescription sent for once weekly supplement

## 2010-12-20 NOTE — Assessment & Plan Note (Signed)
Sleep hygiene discussed 
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
## 2011-01-15 ENCOUNTER — Other Ambulatory Visit: Payer: Self-pay | Admitting: Family Medicine

## 2011-01-15 MED ORDER — CYCLOBENZAPRINE HCL 10 MG PO TABS: 10.0000 mg | ORAL_TABLET | Freq: Every day | ORAL | Status: DC

## 2011-01-15 MED ORDER — OLANZAPINE 5 MG PO TABS: 5.0000 mg | ORAL_TABLET | Freq: Every day | ORAL | Status: DC

## 2011-01-15 MED ORDER — FLUTICASONE PROPIONATE 50 MCG/ACT NA SUSP: 2.0000 | Freq: Every day | NASAL | Status: DC

## 2011-01-15 MED ORDER — PHENOBARBITAL 100 MG PO TABS: 100.0000 mg | ORAL_TABLET | Freq: Two times a day (BID) | ORAL | Status: DC

## 2011-01-15 MED ORDER — PHENYTOIN SODIUM EXTENDED 100 MG PO CAPS: 100.0000 mg | ORAL_CAPSULE | Freq: Three times a day (TID) | ORAL | Status: DC

## 2011-01-18 ENCOUNTER — Other Ambulatory Visit: Payer: Self-pay

## 2011-01-18 MED ORDER — CLONAZEPAM 0.5 MG PO TABS: ORAL_TABLET | ORAL | Status: DC

## 2011-01-19 ENCOUNTER — Ambulatory Visit (HOSPITAL_COMMUNITY)
Admission: RE | Admit: 2011-01-19 | Discharge: 2011-01-19 | Disposition: A | Discharge status: Home / Self Care | Payer: Medicare Other | Source: Ambulatory Visit | Attending: Family Medicine | Admitting: Family Medicine

## 2011-01-19 DIAGNOSIS — Z1231 Encounter for screening mammogram for malignant neoplasm of breast: Secondary | ICD-10-CM | POA: Insufficient documentation

## 2011-01-19 DIAGNOSIS — Z139 Encounter for screening, unspecified: Secondary | ICD-10-CM

## 2011-05-11 ENCOUNTER — Ambulatory Visit (INDEPENDENT_AMBULATORY_CARE_PROVIDER_SITE_OTHER): Payer: Medicare Other | Admitting: Family Medicine

## 2011-05-11 ENCOUNTER — Encounter: Payer: Self-pay | Admitting: Family Medicine

## 2011-05-11 VITALS — BP 110/70 | HR 72 | Resp 16 | Ht 63.0 in | Wt 111.1 lb

## 2011-05-11 DIAGNOSIS — J301 Allergic rhinitis due to pollen: Secondary | ICD-10-CM

## 2011-05-11 DIAGNOSIS — R569 Unspecified convulsions: Secondary | ICD-10-CM

## 2011-05-11 DIAGNOSIS — L680 Hirsutism: Secondary | ICD-10-CM

## 2011-05-11 DIAGNOSIS — F3289 Other specified depressive episodes: Secondary | ICD-10-CM

## 2011-05-11 DIAGNOSIS — E559 Vitamin D deficiency, unspecified: Secondary | ICD-10-CM | POA: Diagnosis not present

## 2011-05-11 DIAGNOSIS — Z1211 Encounter for screening for malignant neoplasm of colon: Secondary | ICD-10-CM | POA: Diagnosis not present

## 2011-05-11 DIAGNOSIS — L678 Other hair color and hair shaft abnormalities: Secondary | ICD-10-CM

## 2011-05-11 DIAGNOSIS — F329 Major depressive disorder, single episode, unspecified: Secondary | ICD-10-CM

## 2011-05-11 HISTORY — DX: Other hair color and hair shaft abnormalities: L67.8

## 2011-05-11 LAB — POC HEMOCCULT BLD/STL (OFFICE/1-CARD/DIAGNOSTIC): Fecal Occult Blood, POC: NEGATIVE

## 2011-05-11 MED ORDER — OLANZAPINE 5 MG PO TABS: 5.0000 mg | ORAL_TABLET | Freq: Every day | ORAL | Status: DC

## 2011-05-11 MED ORDER — PHENYTOIN SODIUM EXTENDED 100 MG PO CAPS: 100.0000 mg | ORAL_CAPSULE | Freq: Three times a day (TID) | ORAL | Status: DC

## 2011-05-11 MED ORDER — PHENOBARBITAL 100 MG PO TABS: 100.0000 mg | ORAL_TABLET | Freq: Two times a day (BID) | ORAL | Status: DC

## 2011-05-11 MED ORDER — CLONAZEPAM 0.5 MG PO TABS: ORAL_TABLET | ORAL | Status: DC

## 2011-05-11 MED ORDER — EFLORNITHINE HCL 13.9 % EX CREA: TOPICAL_CREAM | CUTANEOUS | Status: DC

## 2011-05-11 MED ORDER — CYCLOBENZAPRINE HCL 10 MG PO TABS: 10.0000 mg | ORAL_TABLET | Freq: Every day | ORAL | Status: DC

## 2011-05-11 NOTE — Progress Notes (Signed)
  Subjective:    Patient ID: Sandra Riley, female    DOB: 1960/10/26, 51 y.o.   MRN: [id number]  HPI The PT is here for follow up and re-evaluation of chronic medical conditions, medication management and review of any available recent lab and radiology data.  Preventive health is updated, specifically  Cancer screening and Immunization.   Questions or concerns regarding consultations or procedures which the PT has had in the interim are  addressed. The PT denies any adverse reactions to current medications since the last visit.  There are no new concerns.  There are no specific complaints       Review of Systems    See HPI Denies recent fever or chills. C/o increased   nasal congestion,which is expected at this time of the year, denies sinus pressure, ear pain or sore throat. Denies chest congestion, productive cough or wheezing. Denies chest pains, palpitations and leg swelling Denies abdominal pain, nausea, vomiting,diarrhea or constipation.   Denies dysuria, frequency, hesitancy or incontinence. Denies joint pain, swelling and limitation in mobility. Denies headaches,  numbness, or tingling.Stillexperiencing breakthrough seizures Denies uncontrolled  depression, anxiety or insomnia. Denies skin break down or rash.     Objective:   Physical Exam Patient alert and oriented and in no cardiopulmonary distress.  HEENT: No facial asymmetry, EOMI, no sinus tenderness,  oropharynx pink and moist.  Neck supple no adenopathy.Erythem and edema of nasal mucosa  Chest: Clear to auscultation bilaterally.  CVS: S1, S2 no murmurs, no S3.  ABD: Soft non tender. Bowel sounds normal.  Ext: No edema  MS: Adequate ROM spine, shoulders, hips and knees.  Skin: Intact, no ulcerations or rash noted.  Psych: Good eye contact, normal affect. Memory intact not anxious or depressed appearing.  CNS: CN 2-12 intact, power, tone and sensation normal throughout.        Assessment &  Plan:

## 2011-05-11 NOTE — Patient Instructions (Signed)
F/u in 6 month  Please call if you need me before  Medication is sent in for facial hair, use ONCE daily please.  NON stat labs today, phenytoin level, phenobarbitone level and vit D level.  Rectal exam has guaiac negative stool

## 2011-05-12 LAB — PHENOBARBITAL LEVEL: Phenobarbital: 27.2 ug/mL (ref 15.0–40.0)

## 2011-05-12 LAB — PHENYTOIN LEVEL, TOTAL: Phenytoin Lvl: 25.7 ug/mL — ABNORMAL HIGH (ref 10.0–20.0)

## 2011-05-12 MED ORDER — PHENYTOIN SODIUM EXTENDED 100 MG PO CAPS: 100.0000 mg | ORAL_CAPSULE | Freq: Two times a day (BID) | ORAL | Status: DC

## 2011-05-17 NOTE — Assessment & Plan Note (Signed)
Still reports break through seizures, will check levels

## 2011-05-17 NOTE — Assessment & Plan Note (Signed)
Shaves regularly, requests prescription med , same prescribed, I explained this needs to be applied on a daily basis

## 2011-05-17 NOTE — Assessment & Plan Note (Signed)
Controlled, no change in medication  

## 2011-06-09 ENCOUNTER — Telehealth: Payer: Self-pay | Admitting: Family Medicine

## 2011-06-09 NOTE — Telephone Encounter (Signed)
Called and pt already has refills left

## 2011-08-02 ENCOUNTER — Other Ambulatory Visit: Payer: Self-pay

## 2011-08-02 ENCOUNTER — Telehealth: Payer: Self-pay | Admitting: Family Medicine

## 2011-08-02 DIAGNOSIS — R569 Unspecified convulsions: Secondary | ICD-10-CM

## 2011-08-02 MED ORDER — PHENYTOIN SODIUM EXTENDED 100 MG PO CAPS: 100.0000 mg | ORAL_CAPSULE | Freq: Two times a day (BID) | ORAL | Status: DC

## 2011-08-02 NOTE — Telephone Encounter (Signed)
Med refilled.

## 2011-09-07 ENCOUNTER — Telehealth: Payer: Self-pay | Admitting: Family Medicine

## 2011-09-07 DIAGNOSIS — R569 Unspecified convulsions: Secondary | ICD-10-CM

## 2011-09-07 DIAGNOSIS — L678 Other hair color and hair shaft abnormalities: Secondary | ICD-10-CM

## 2011-09-08 MED ORDER — PHENOBARBITAL 100 MG PO TABS: 100.0000 mg | ORAL_TABLET | Freq: Two times a day (BID) | ORAL | Status: DC

## 2011-09-08 MED ORDER — VITAMIN D (ERGOCALCIFEROL) 1.25 MG (50000 UNIT) PO CAPS: 50000.0000 [IU] | ORAL_CAPSULE | ORAL | Status: DC

## 2011-09-08 MED ORDER — OLANZAPINE 5 MG PO TABS: 5.0000 mg | ORAL_TABLET | Freq: Every day | ORAL | Status: DC

## 2011-09-08 MED ORDER — PHENYTOIN SODIUM EXTENDED 100 MG PO CAPS: 100.0000 mg | ORAL_CAPSULE | Freq: Two times a day (BID) | ORAL | Status: DC

## 2011-09-08 MED ORDER — CYCLOBENZAPRINE HCL 10 MG PO TABS: 10.0000 mg | ORAL_TABLET | Freq: Every day | ORAL | Status: DC

## 2011-09-08 MED ORDER — EFLORNITHINE HCL 13.9 % EX CREA: TOPICAL_CREAM | CUTANEOUS | Status: AC

## 2011-09-08 MED ORDER — CLONAZEPAM 0.5 MG PO TABS: ORAL_TABLET | ORAL | Status: DC

## 2011-09-08 NOTE — Telephone Encounter (Signed)
refilled 

## 2011-09-08 NOTE — Telephone Encounter (Signed)
Refilled

## 2011-11-23 ENCOUNTER — Ambulatory Visit (INDEPENDENT_AMBULATORY_CARE_PROVIDER_SITE_OTHER): Payer: Medicare Other | Admitting: Family Medicine

## 2011-11-23 ENCOUNTER — Other Ambulatory Visit: Payer: Self-pay | Admitting: Family Medicine

## 2011-11-23 ENCOUNTER — Encounter: Payer: Self-pay | Admitting: Family Medicine

## 2011-11-23 VITALS — BP 136/80 | HR 74 | Resp 18 | Ht 63.0 in | Wt 119.1 lb

## 2011-11-23 DIAGNOSIS — G40909 Epilepsy, unspecified, not intractable, without status epilepticus: Secondary | ICD-10-CM

## 2011-11-23 DIAGNOSIS — Z23 Encounter for immunization: Secondary | ICD-10-CM | POA: Diagnosis not present

## 2011-11-23 DIAGNOSIS — F319 Bipolar disorder, unspecified: Secondary | ICD-10-CM

## 2011-11-23 DIAGNOSIS — R569 Unspecified convulsions: Secondary | ICD-10-CM | POA: Diagnosis not present

## 2011-11-23 DIAGNOSIS — IMO0001 Reserved for inherently not codable concepts without codable children: Secondary | ICD-10-CM

## 2011-11-23 LAB — CBC
HCT: 39.7 % (ref 36.0–46.0)
Hemoglobin: 14 g/dL (ref 12.0–15.0)
MCH: 32.7 pg (ref 26.0–34.0)
MCHC: 35.3 g/dL (ref 30.0–36.0)

## 2011-11-23 NOTE — Progress Notes (Signed)
  Subjective:    Patient ID: Sandra Riley, female    DOB: December 29, 1960, 51 y.o.   MRN: [id number]  HPI Patient here to follow chronic medical problems. She has no specific concerns. States it she's had about 6 seizures since her last visit 6 months ago. She thinks they have increase and her medication was changed to Dilantin was decreased to twice daily after an elevated level. She's been under some stress recently because her husband recently had a stroke and she is helping care for him.    Review of Systems  GEN- denies fatigue, fever, weight loss,weakness, recent illness HEENT- denies eye drainage, change in vision, nasal discharge, CVS- denies chest pain, palpitations RESP- denies SOB, cough, wheeze ABD- denies N/V, change in stools, abd pain GU- denies dysuria, hematuria, dribbling, incontinence MSK- denies joint pain, muscle aches, injury Neuro- denies headache, dizziness, syncope, seizure activity      Objective:   Physical Exam GEN- NAD, alert and oriented x3 HEENT- PERRL, EOMI, non injected sclera, pink conjunctiva, MMM, oropharynx clear Neck- Supple,  CVS- RRR, 2/6 SEM   RESP-CTAB ABS-NABS,soft,NT,ND EXT- No edema Pulses- Radial, DP- 2+ Neuro- CNII-XII in tact, no focal deficits Psych-normal affect and Mood       Assessment & Plan:

## 2011-11-23 NOTE — Patient Instructions (Signed)
We will call with any medication changes  Labs to be done today Flu shot given Make sure to schedule Mammogram in December F/U 4 months- Dr. Lodema Hong

## 2011-11-24 LAB — COMPREHENSIVE METABOLIC PANEL
AST: 18 U/L (ref 0–37)
Alkaline Phosphatase: 118 U/L — ABNORMAL HIGH (ref 39–117)
BUN: 7 mg/dL (ref 6–23)
Creat: 0.53 mg/dL (ref 0.50–1.10)
Potassium: 4 mEq/L (ref 3.5–5.3)
Total Bilirubin: 0.3 mg/dL (ref 0.3–1.2)

## 2011-11-24 LAB — PHENYTOIN LEVEL, TOTAL: Phenytoin Lvl: 10.8 ug/mL (ref 10.0–20.0)

## 2011-11-24 NOTE — Assessment & Plan Note (Signed)
Increased seizure activity, despite normal levels, she has more staring spells and trances instead of tonic clonic movements, will refer to neurology for evaluation

## 2011-11-24 NOTE — Assessment & Plan Note (Signed)
Doing well no change to medication

## 2011-12-14 DIAGNOSIS — G40919 Epilepsy, unspecified, intractable, without status epilepticus: Secondary | ICD-10-CM | POA: Diagnosis not present

## 2011-12-24 DIAGNOSIS — R569 Unspecified convulsions: Secondary | ICD-10-CM | POA: Diagnosis not present

## 2012-01-03 ENCOUNTER — Other Ambulatory Visit: Payer: Self-pay | Admitting: Family Medicine

## 2012-01-05 ENCOUNTER — Other Ambulatory Visit: Payer: Self-pay | Admitting: Family Medicine

## 2012-01-07 ENCOUNTER — Telehealth: Payer: Self-pay | Admitting: Family Medicine

## 2012-01-07 ENCOUNTER — Other Ambulatory Visit: Payer: Self-pay

## 2012-01-07 MED ORDER — PHENOBARBITAL 100 MG PO TABS: 100.0000 mg | ORAL_TABLET | Freq: Two times a day (BID) | ORAL | Status: DC

## 2012-01-07 NOTE — Telephone Encounter (Signed)
Med refilled.

## 2012-01-11 DIAGNOSIS — Z79899 Other long term (current) drug therapy: Secondary | ICD-10-CM | POA: Diagnosis not present

## 2012-01-11 DIAGNOSIS — G40219 Localization-related (focal) (partial) symptomatic epilepsy and epileptic syndromes with complex partial seizures, intractable, without status epilepticus: Secondary | ICD-10-CM | POA: Diagnosis not present

## 2012-01-20 ENCOUNTER — Ambulatory Visit (HOSPITAL_COMMUNITY): Payer: Medicare Other

## 2012-01-31 ENCOUNTER — Ambulatory Visit (HOSPITAL_COMMUNITY): Payer: Medicare Other

## 2012-02-07 ENCOUNTER — Ambulatory Visit (HOSPITAL_COMMUNITY): Payer: Medicare Other

## 2012-02-14 ENCOUNTER — Ambulatory Visit (HOSPITAL_COMMUNITY)
Admission: RE | Admit: 2012-02-14 | Discharge: 2012-02-14 | Disposition: A | Discharge status: Home / Self Care | Payer: Medicare Other | Source: Ambulatory Visit | Attending: Family Medicine | Admitting: Family Medicine

## 2012-02-14 DIAGNOSIS — Z1231 Encounter for screening mammogram for malignant neoplasm of breast: Secondary | ICD-10-CM | POA: Insufficient documentation

## 2012-02-14 DIAGNOSIS — IMO0001 Reserved for inherently not codable concepts without codable children: Secondary | ICD-10-CM

## 2012-02-24 DIAGNOSIS — G40219 Localization-related (focal) (partial) symptomatic epilepsy and epileptic syndromes with complex partial seizures, intractable, without status epilepticus: Secondary | ICD-10-CM | POA: Diagnosis not present

## 2012-02-24 DIAGNOSIS — G4700 Insomnia, unspecified: Secondary | ICD-10-CM | POA: Diagnosis not present

## 2012-02-24 DIAGNOSIS — Z79899 Other long term (current) drug therapy: Secondary | ICD-10-CM | POA: Diagnosis not present

## 2012-03-06 ENCOUNTER — Other Ambulatory Visit: Payer: Self-pay | Admitting: Family Medicine

## 2012-03-23 ENCOUNTER — Ambulatory Visit (INDEPENDENT_AMBULATORY_CARE_PROVIDER_SITE_OTHER): Payer: Medicare Other | Admitting: Family Medicine

## 2012-03-23 ENCOUNTER — Encounter: Payer: Self-pay | Admitting: Family Medicine

## 2012-03-23 VITALS — BP 114/78 | HR 105 | Resp 16 | Wt 123.0 lb

## 2012-03-23 DIAGNOSIS — F341 Dysthymic disorder: Secondary | ICD-10-CM

## 2012-03-23 DIAGNOSIS — F319 Bipolar disorder, unspecified: Secondary | ICD-10-CM

## 2012-03-23 DIAGNOSIS — R569 Unspecified convulsions: Secondary | ICD-10-CM | POA: Diagnosis not present

## 2012-03-23 MED ORDER — OLANZAPINE 5 MG PO TABS: ORAL_TABLET | ORAL | Status: DC

## 2012-03-23 NOTE — Progress Notes (Signed)
  Subjective:    Patient ID: Sandra Riley, female    DOB: Apr 05, 1960, 52 y.o.   MRN: [id number]  HPI    Pt here to f/u chronic medical problems. She's been followed by neurology for her seizure disorder she continues to have prodrome where she feels like she is going to have a seizure this consistent healing very anxious and nauseous as well as accompanied by headache however she's only had one seizure since her medications were changed about 3 months ago. She's currently on Keppra and phenobarbital she's still on olanzapine for her bipolar/depression and she also takes Klonopin 3 times a day which is currently being prescribed by neurology.   Review of Systems  GEN- denies fatigue, fever, weight loss,weakness, recent illness HEENT- denies eye drainage, change in vision, nasal discharge, CVS- denies chest pain, palpitations RESP- denies SOB, cough, wheeze ABD- denies N/V, change in stools, abd pain GU- denies dysuria, hematuria, dribbling, incontinence MSK- denies joint pain, muscle aches, injury Neuro- denies headache, dizziness, syncope, + seizure activity      Objective:   Physical Exam GEN- NAD, alert and oriented x3 HEENT- PERRL, EOMI, non injected sclera, pink conjunctiva, MMM, oropharynx clear, no nystagmus Neck- Supple, no bruit CVS- RRR,2/6 SEM  RESP-CTAB ABD-NABS,soft,NT,ND EXT- No edema Pulses- Radial, DP- 2+        Assessment & Plan:

## 2012-03-23 NOTE — Patient Instructions (Signed)
Continue current medications Your seizures have improved Discuss your symptoms on Monday with seizure doctor - Dr. Gerilyn Pilgrim  F/U 4 months Dr. Lodema Hong

## 2012-03-24 NOTE — Assessment & Plan Note (Signed)
Overall her anxiety has been well controlled she will continue the Klonopin at the current dose of note the last prescription was given by neurology

## 2012-03-24 NOTE — Assessment & Plan Note (Signed)
Her mood has been stable medications refilled

## 2012-03-24 NOTE — Assessment & Plan Note (Signed)
She's here to have prodrome but no actual seizure. She is followup neurology on Monday and we'll not change any of her medication overall she is doing well

## 2012-03-27 DIAGNOSIS — G4700 Insomnia, unspecified: Secondary | ICD-10-CM | POA: Diagnosis not present

## 2012-03-27 DIAGNOSIS — G40219 Localization-related (focal) (partial) symptomatic epilepsy and epileptic syndromes with complex partial seizures, intractable, without status epilepticus: Secondary | ICD-10-CM | POA: Diagnosis not present

## 2012-04-24 DIAGNOSIS — G40219 Localization-related (focal) (partial) symptomatic epilepsy and epileptic syndromes with complex partial seizures, intractable, without status epilepticus: Secondary | ICD-10-CM | POA: Diagnosis not present

## 2012-04-24 DIAGNOSIS — Z79899 Other long term (current) drug therapy: Secondary | ICD-10-CM | POA: Diagnosis not present

## 2012-04-24 DIAGNOSIS — G4700 Insomnia, unspecified: Secondary | ICD-10-CM | POA: Diagnosis not present

## 2012-05-29 ENCOUNTER — Other Ambulatory Visit: Payer: Self-pay | Admitting: Family Medicine

## 2012-06-23 DIAGNOSIS — G40219 Localization-related (focal) (partial) symptomatic epilepsy and epileptic syndromes with complex partial seizures, intractable, without status epilepticus: Secondary | ICD-10-CM | POA: Diagnosis not present

## 2012-06-23 DIAGNOSIS — Z79899 Other long term (current) drug therapy: Secondary | ICD-10-CM | POA: Diagnosis not present

## 2012-06-23 DIAGNOSIS — G4700 Insomnia, unspecified: Secondary | ICD-10-CM | POA: Diagnosis not present

## 2012-06-30 ENCOUNTER — Other Ambulatory Visit: Payer: Self-pay | Admitting: Family Medicine

## 2012-07-20 ENCOUNTER — Encounter: Payer: Self-pay | Admitting: Family Medicine

## 2012-07-20 ENCOUNTER — Ambulatory Visit (INDEPENDENT_AMBULATORY_CARE_PROVIDER_SITE_OTHER): Payer: Medicare Other | Admitting: Family Medicine

## 2012-07-20 VITALS — BP 130/82 | HR 77 | Resp 16 | Ht 63.0 in | Wt 128.4 lb

## 2012-07-20 DIAGNOSIS — Z13220 Encounter for screening for lipoid disorders: Secondary | ICD-10-CM | POA: Diagnosis not present

## 2012-07-20 DIAGNOSIS — Z1211 Encounter for screening for malignant neoplasm of colon: Secondary | ICD-10-CM

## 2012-07-20 DIAGNOSIS — Z1329 Encounter for screening for other suspected endocrine disorder: Secondary | ICD-10-CM | POA: Diagnosis not present

## 2012-07-20 DIAGNOSIS — J301 Allergic rhinitis due to pollen: Secondary | ICD-10-CM

## 2012-07-20 DIAGNOSIS — F341 Dysthymic disorder: Secondary | ICD-10-CM

## 2012-07-20 DIAGNOSIS — F319 Bipolar disorder, unspecified: Secondary | ICD-10-CM

## 2012-07-20 DIAGNOSIS — G4700 Insomnia, unspecified: Secondary | ICD-10-CM

## 2012-07-20 DIAGNOSIS — F329 Major depressive disorder, single episode, unspecified: Secondary | ICD-10-CM | POA: Diagnosis not present

## 2012-07-20 DIAGNOSIS — R569 Unspecified convulsions: Secondary | ICD-10-CM

## 2012-07-20 DIAGNOSIS — Z1212 Encounter for screening for malignant neoplasm of rectum: Secondary | ICD-10-CM

## 2012-07-20 LAB — POC HEMOCCULT BLD/STL (OFFICE/1-CARD/DIAGNOSTIC): Fecal Occult Blood, POC: NEGATIVE

## 2012-07-20 MED ORDER — FLUOXETINE HCL 10 MG PO CAPS: 10.0000 mg | ORAL_CAPSULE | Freq: Every day | ORAL | Status: DC

## 2012-07-20 MED ORDER — OLANZAPINE 5 MG PO TABS: ORAL_TABLET | ORAL | Status: DC

## 2012-07-20 MED ORDER — CYCLOBENZAPRINE HCL 10 MG PO TABS: ORAL_TABLET | ORAL | Status: DC

## 2012-07-20 MED ORDER — FLUTICASONE PROPIONATE 50 MCG/ACT NA SUSP: 2.0000 | Freq: Every day | NASAL | Status: DC

## 2012-07-20 NOTE — Assessment & Plan Note (Signed)
Recent deterioration in sleep due to increased anxiety reported
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
## 2012-07-20 NOTE — Assessment & Plan Note (Addendum)
PHQ 9 of 16  On 07/20/2012, triggered, by spouse's stroke in the Fall 2013  Uncontrolled start fluoxetine, not suicidal or homicidal, counseling declined

## 2012-07-20 NOTE — Progress Notes (Signed)
  Subjective:    Patient ID: Sandra Riley, female    DOB: [birth year], 52 y.o.   MRN: [id number]  HPI The PT is here for follow up and re-evaluation of chronic medical conditions, medication management and review of any available recent lab and radiology data.  Preventive health is updated, specifically  Cancer screening and Immunization.   Questions or concerns regarding consultations or procedures which the PT has had in the interim are  Addressed.Has ben evaluated by neurology for uncontrolled seizure disorder, changed from dilantin to kepra and is doing much better, still reports auras , but no seizures C/o increased anxiety and depression following her spouse's stroke, sleep is disturbed, she is not suicidal or homicidal       Review of Systems See HPI Denies recent fever or chills. Denies sinus pressure, nasal congestion, ear pain or sore throat. Denies chest congestion, productive cough or wheezing. Denies chest pains, palpitations and leg swelling Denies abdominal pain, nausea, vomiting,diarrhea or constipation. Denies change in bowel movemnts  Denies dysuria, frequency, hesitancy or incontinence. Denies joint pain, swelling and limitation in mobility.  Denies skin break down or rash.        Objective:   Physical Exam  Patient alert and oriented and in no cardiopulmonary distress.  HEENT: No facial asymmetry, EOMI, no sinus tenderness,  oropharynx pink and moist.  Neck supple no adenopathy.  Chest: Clear to auscultation bilaterally.  CVS: S1, S2 no murmurs, no S3.  ABD: Soft non tender. Bowel sounds normal. Rectal: no mass, heme negative stool Ext: No edema  MS: Adequate ROM spine, shoulders, hips and knees.  Skin: Intact, no ulcerations or rash noted.  Psych: Good eye contact, normal affect. Memory intact not anxious or depressed appearing.  CNS: CN 2-12 intact, power, tone and sensation normal throughout.       Assessment & Plan:

## 2012-07-20 NOTE — Assessment & Plan Note (Signed)
Uncontrolled as pt has not been using med as prescribed, educated re the need to do so regularly for symptom control

## 2012-07-20 NOTE — Patient Instructions (Addendum)
Annual wellness October 29 , or after. Please call if you need me before  Please get fasting labs Oct 22 or after, but before visit. CBC, lipid, cmp, TSH   New for your depressio nsymptoms is fluoxetine one daily , this should help a lot  Also start flonase daily for allergy control  Rectal today

## 2012-07-20 NOTE — Assessment & Plan Note (Signed)
Improved on current medication through neurology. Denies seizures, reports auras

## 2012-07-20 NOTE — Assessment & Plan Note (Signed)
Denies auditory or visual hallucinations

## 2012-08-23 DIAGNOSIS — R569 Unspecified convulsions: Secondary | ICD-10-CM | POA: Diagnosis not present

## 2012-08-23 DIAGNOSIS — J309 Allergic rhinitis, unspecified: Secondary | ICD-10-CM | POA: Diagnosis not present

## 2012-10-23 DIAGNOSIS — R569 Unspecified convulsions: Secondary | ICD-10-CM | POA: Diagnosis not present

## 2012-10-23 DIAGNOSIS — G4700 Insomnia, unspecified: Secondary | ICD-10-CM | POA: Diagnosis not present

## 2012-10-23 DIAGNOSIS — Z79899 Other long term (current) drug therapy: Secondary | ICD-10-CM | POA: Diagnosis not present

## 2012-10-23 DIAGNOSIS — J309 Allergic rhinitis, unspecified: Secondary | ICD-10-CM | POA: Diagnosis not present

## 2012-11-24 ENCOUNTER — Other Ambulatory Visit: Payer: Self-pay | Admitting: Family Medicine

## 2012-11-24 DIAGNOSIS — Z13220 Encounter for screening for lipoid disorders: Secondary | ICD-10-CM | POA: Diagnosis not present

## 2012-11-24 DIAGNOSIS — F329 Major depressive disorder, single episode, unspecified: Secondary | ICD-10-CM | POA: Diagnosis not present

## 2012-11-24 DIAGNOSIS — Z79899 Other long term (current) drug therapy: Secondary | ICD-10-CM | POA: Diagnosis not present

## 2012-11-24 DIAGNOSIS — Z1329 Encounter for screening for other suspected endocrine disorder: Secondary | ICD-10-CM | POA: Diagnosis not present

## 2012-11-24 LAB — CBC
Hemoglobin: 13.4 g/dL (ref 12.0–15.0)
MCHC: 34.6 g/dL (ref 30.0–36.0)
WBC: 5.2 10*3/uL (ref 4.0–10.5)

## 2012-11-25 LAB — TSH: TSH: 1.023 u[IU]/mL (ref 0.350–4.500)

## 2012-11-25 LAB — COMPREHENSIVE METABOLIC PANEL
AST: 20 U/L (ref 0–37)
Albumin: 4.5 g/dL (ref 3.5–5.2)
Alkaline Phosphatase: 113 U/L (ref 39–117)
BUN: 5 mg/dL — ABNORMAL LOW (ref 6–23)
Calcium: 9.8 mg/dL (ref 8.4–10.5)
Chloride: 99 mEq/L (ref 96–112)
Creat: 0.6 mg/dL (ref 0.50–1.10)
Glucose, Bld: 85 mg/dL (ref 70–99)
Potassium: 4.4 mEq/L (ref 3.5–5.3)

## 2012-11-25 LAB — LIPID PANEL
Cholesterol: 176 mg/dL (ref 0–200)
HDL: 59 mg/dL (ref >39)
Total CHOL/HDL Ratio: 3 Ratio
Triglycerides: 95 mg/dL (ref <150)

## 2012-12-04 ENCOUNTER — Encounter (INDEPENDENT_AMBULATORY_CARE_PROVIDER_SITE_OTHER): Payer: Self-pay

## 2012-12-04 ENCOUNTER — Encounter: Payer: Self-pay | Admitting: Family Medicine

## 2012-12-04 ENCOUNTER — Ambulatory Visit (INDEPENDENT_AMBULATORY_CARE_PROVIDER_SITE_OTHER): Payer: Medicare Other | Admitting: Family Medicine

## 2012-12-04 VITALS — BP 142/82 | HR 90 | Resp 18 | Ht 63.0 in | Wt 133.0 lb

## 2012-12-04 DIAGNOSIS — Z23 Encounter for immunization: Secondary | ICD-10-CM

## 2012-12-04 DIAGNOSIS — Z0000 Encounter for general adult medical examination without abnormal findings: Secondary | ICD-10-CM

## 2012-12-04 DIAGNOSIS — F341 Dysthymic disorder: Secondary | ICD-10-CM

## 2012-12-04 MED ORDER — MIRTAZAPINE 15 MG PO TABS: 15.0000 mg | ORAL_TABLET | Freq: Every day | ORAL | Status: DC

## 2012-12-04 MED ORDER — MIRTAZAPINE 15 MG PO TBDP: 15.0000 mg | ORAL_TABLET | Freq: Every day | ORAL | Status: DC

## 2012-12-04 NOTE — Progress Notes (Signed)
Subjective:    Patient ID: Sandra Riley, female    DOB: Aug 06, 1960, 52 y.o.   MRN: [id number]  HPI  Preventive Screening-Counseling & Management   Patient present here today for a Medicare annual wellness visit.   Current Problems (verified)   Medications Prior to Visit Allergies (verified)   PAST HISTORY  Family History recorded  Social History Married x 35 years, mom of 3 adult sons. Has been on disability due to seizures which started a t age 77. No nicotine or drug use  Risk Factors  Current exercise habits:  Daily for  Dietary issues discussed:low fat diet rich in vegetables   Cardiac risk factors: none significant  Depression Screen  (Note: if answer to either of the following is "Yes", a more complete depression screening is indicated)   Over the past two weeks, have you felt down, depressed or hopeless? yes Over the past two weeks, have you felt little interest or pleasure in doing things? yes  Have you lost interest or pleasure in daily life? No  Do you often feel hopeless? yes Do you cry easily over simple problems? No  Full screen recorded, not suicidal or homicidal  Activities of Daily Living  In your present state of health, do you have any difficulty performing the following activities?  Driving?: No Managing money?: No Feeding yourself?:No Getting from bed to chair?:No Climbing a flight of stairs?:No Preparing food and eating?:No Bathing or showering?:No Getting dressed?:No Getting to the toilet?:No Using the toilet?:No Moving around from place to place?: No  Fall Risk Assessment In the past year have you fallen or had a near fall?:No Are you currently taking any medications that make you dizzy?:No   Hearing Difficulties: No Do you often ask people to speak up or repeat themselves?:No Do you experience ringing or noises in your ears?:No Do you have difficulty understanding soft or whispered voices?:No  Cognitive Testing   Alert? Yes Normal Appearance?Yes  Oriented to person? Yes Place? Yes  Time? Yes  Displays appropriate judgment?Yes  Can read the correct time from a watch face? yes Are you having problems remembering things?No  Advanced Directives have been discussed with the patient?Yes , full code   List the Names of Other Physician/Practitioners you currently use: Dr Geoffry Paradise any recent Medical Services you may have received from other than [facility] providers in the past year (date may be approximate).   Assessment:    Annual Wellness Exam   Plan:    During the course of the visit the patient was educated and counseled about appropriate screening and preventive services including:  A healthy diet is rich in fruit, vegetables and whole grains. Poultry fish, nuts and beans are a healthy choice for protein rather then red meat. A low sodium diet and drinking 64 ounces of water daily is generally recommended. Oils and sweet should be limited. Carbohydrates especially for those who are diabetic or overweight, should be limited to 30-45 gram per meal. It is important to eat on a regular schedule, at least 3 times daily. Snacks should be primarily fruits, vegetables or nuts. It is important that you exercise regularly at least 30 minutes 5 times a week. If you develop chest pain, have severe difficulty breathing, or feel very tired, stop exercising immediately and seek medical attention  Immunization reviewed and updated. Cancer screening reviewed and updated    Patient Instructions (the written plan) was given to the patient.  Medicare Attestation  I have personally  reviewed:  The patient's medical and social history  Their use of alcohol, tobacco or illicit drugs  Their current medications and supplements  The patient's functional ability including ADLs,fall risks, home safety risks, cognitive, and hearing and visual impairment  Diet and physical activities  Evidence for depression or  mood disorders  The patient's weight, height, BMI, and visual acuity have been recorded in the chart. I have made referrals, counseling, and provided education to the patient based on review of the above and I have provided the patient with a written personalized care plan for preventive services.     Review of Systems     Objective:   Physical Exam        Assessment & Plan:

## 2012-12-04 NOTE — Patient Instructions (Addendum)
F/u in 2 month, call if you need me before  Flu vaccine today  I will be changing medication you take for depression and difficulty with sleep.  REDUCE olanzapine and fluoxetine  to one tablet every other day for this 1 week,this week, Monday, Wednesday, Friday and Sunday, then take only on Tuesday , Thursday  and Saturday the week of November 10, then STOP    New for depression and sleep starting Nov 10 is remeron 15 mg one at bedtime   Please stop taking cyclobenzaprine every night, use only for muscle spasm when you have seizure

## 2012-12-10 DIAGNOSIS — Z0000 Encounter for general adult medical examination without abnormal findings: Secondary | ICD-10-CM | POA: Insufficient documentation

## 2012-12-10 NOTE — Assessment & Plan Note (Signed)
Annual wellness as documented Depression management is being changed based on pHQ9 score. Pt has never had clear bipolar diagnosis, no hypomanic symptoms described, will change to remeron Not suicidal or homicidal, no hallucinations or delusions

## 2012-12-20 DIAGNOSIS — G4700 Insomnia, unspecified: Secondary | ICD-10-CM | POA: Diagnosis not present

## 2012-12-20 DIAGNOSIS — Z79899 Other long term (current) drug therapy: Secondary | ICD-10-CM | POA: Diagnosis not present

## 2012-12-20 DIAGNOSIS — R569 Unspecified convulsions: Secondary | ICD-10-CM | POA: Diagnosis not present

## 2013-02-05 ENCOUNTER — Ambulatory Visit (INDEPENDENT_AMBULATORY_CARE_PROVIDER_SITE_OTHER): Payer: Medicare Other | Admitting: Family Medicine

## 2013-02-05 ENCOUNTER — Encounter: Payer: Self-pay | Admitting: Family Medicine

## 2013-02-05 ENCOUNTER — Encounter (INDEPENDENT_AMBULATORY_CARE_PROVIDER_SITE_OTHER): Payer: Self-pay

## 2013-02-05 VITALS — BP 136/78 | HR 98 | Resp 18 | Ht 63.0 in | Wt 121.0 lb

## 2013-02-05 DIAGNOSIS — E559 Vitamin D deficiency, unspecified: Secondary | ICD-10-CM

## 2013-02-05 DIAGNOSIS — F341 Dysthymic disorder: Secondary | ICD-10-CM | POA: Diagnosis not present

## 2013-02-05 DIAGNOSIS — R51 Headache: Secondary | ICD-10-CM | POA: Diagnosis not present

## 2013-02-05 DIAGNOSIS — R110 Nausea: Secondary | ICD-10-CM | POA: Diagnosis not present

## 2013-02-05 DIAGNOSIS — K5289 Other specified noninfective gastroenteritis and colitis: Secondary | ICD-10-CM | POA: Diagnosis not present

## 2013-02-05 DIAGNOSIS — R569 Unspecified convulsions: Secondary | ICD-10-CM | POA: Diagnosis not present

## 2013-02-05 DIAGNOSIS — R519 Headache, unspecified: Secondary | ICD-10-CM | POA: Insufficient documentation

## 2013-02-05 DIAGNOSIS — K529 Noninfective gastroenteritis and colitis, unspecified: Secondary | ICD-10-CM

## 2013-02-05 MED ORDER — ONDANSETRON HCL 4 MG PO TABS: ORAL_TABLET | ORAL | Status: DC

## 2013-02-05 MED ORDER — MIRTAZAPINE 30 MG PO TBDP: 30.0000 mg | ORAL_TABLET | Freq: Every day | ORAL | Status: DC

## 2013-02-05 NOTE — Patient Instructions (Signed)
F/u in 2.5 month, call if you need me before  Toradol for headache and zofran in office and ordered  For nausea  Discuss seizure with neurologist  Increase in dose of remeron as of today please.  Diet for Diarrhea, Adult Frequent, runny stools (diarrhea) may be caused or worsened by food or drink. Diarrhea may be relieved by changing your diet. Since diarrhea can last up to 7 days, it is easy for you to lose too much fluid from the body and become dehydrated. Fluids that are lost need to be replaced. Along with a modified diet, make sure you drink enough fluids to keep your urine clear or pale yellow. DIET INSTRUCTIONS  Ensure adequate fluid intake (hydration): have 1 cup (8 oz) of fluid for each diarrhea episode. Avoid fluids that contain simple sugars or sports drinks, fruit juices, whole milk products, and sodas. Your urine should be clear or pale yellow if you are drinking enough fluids. Hydrate with an oral rehydration solution that you can purchase at pharmacies, retail stores, and online. You can prepare an oral rehydration solution at home by mixing the following ingredients together:    tsp table salt.   tsp baking soda.   tsp salt substitute containing potassium chloride.  1  tablespoons sugar.  1 L (34 oz) of water.  Certain foods and beverages may increase the speed at which food moves through the gastrointestinal (GI) tract. These foods and beverages should be avoided and include:  Caffeinated and alcoholic beverages.  High-fiber foods, such as raw fruits and vegetables, nuts, seeds, and whole grain breads and cereals.  Foods and beverages sweetened with sugar alcohols, such as xylitol, sorbitol, and mannitol.  Some foods may be well tolerated and may help thicken stool including:  Starchy foods, such as rice, toast, pasta, low-sugar cereal, oatmeal, grits, baked potatoes, crackers, and bagels.   Bananas.   Applesauce.  Add probiotic-rich foods to help increase  healthy bacteria in the GI tract, such as yogurt and fermented milk products. RECOMMENDED FOODS AND BEVERAGES Starches Choose foods with less than 2 g of fiber per serving.  Recommended:  White, [place], and pita breads, plain rolls, buns, bagels. Plain muffins, matzo. Soda, saltine, or graham crackers. Pretzels, melba toast, zwieback. Cooked cereals made with water: cornmeal, farina, cream cereals. Dry cereals: refined corn, wheat, rice. Potatoes prepared any way without skins, refined macaroni, spaghetti, noodles, refined rice.  Avoid:  Bread, rolls, or crackers made with whole wheat, multi-grains, rye, bran seeds, nuts, or coconut. Corn tortillas or taco shells. Cereals containing whole grains, multi-grains, bran, coconut, nuts, raisins. Cooked or dry oatmeal. Coarse wheat cereals, granola. Cereals advertised as "high-fiber." Potato skins. Whole grain pasta, wild or brown rice. Popcorn. Sweet potatoes, yams. Sweet rolls, doughnuts, waffles, pancakes, sweet breads. Vegetables  Recommended: Strained tomato and vegetable juices. Most well-cooked and canned vegetables without seeds. Fresh: Tender lettuce, cucumber without the skin, cabbage, spinach, bean sprouts.  Avoid: Fresh, cooked, or canned: Artichokes, baked beans, beet greens, broccoli, Brussels sprouts, corn, kale, legumes, peas, sweet potatoes. Cooked: Green or red cabbage, spinach. Avoid large servings of any vegetables because vegetables shrink when cooked, and they contain more fiber per serving than fresh vegetables. Fruit  Recommended: Cooked or canned: Apricots, applesauce, cantaloupe, cherries, fruit cocktail, grapefruit, grapes, kiwi, mandarin oranges, peaches, pears, plums, watermelon. Fresh: Apples without skin, ripe banana, grapes, cantaloupe, cherries, grapefruit, peaches, oranges, plums. Keep servings limited to  cup or 1 piece.  Avoid: Fresh: Apples with skin, apricots,  mangoes, pears, raspberries, strawberries. Prune juice,  stewed or dried prunes. Dried fruits, raisins, dates. Large servings of all fresh fruits. Protein  Recommended: Ground or well-cooked tender beef, ham, veal, lamb, pork, or poultry. Eggs. Fish, oysters, shrimp, lobster, other seafoods. Liver, organ meats.  Avoid: Tough, fibrous meats with gristle. Peanut butter, smooth or chunky. Cheese, nuts, seeds, legumes, dried peas, beans, lentils. Dairy  Recommended: Yogurt, lactose-free milk, kefir, drinkable yogurt, buttermilk, soy milk, or plain hard cheese.  Avoid: Milk, chocolate milk, beverages made with milk, such as milkshakes. Soups  Recommended: Bouillon, broth, or soups made from allowed foods. Any strained soup.  Avoid: Soups made from vegetables that are not allowed, cream or milk-based soups. Desserts and Sweets  Recommended: Sugar-free gelatin, sugar-free frozen ice pops made without sugar alcohol.  Avoid: Plain cakes and cookies, pie made with fruit, pudding, custard, cream pie. Gelatin, fruit, ice, sherbet, frozen ice pops. Ice cream, ice milk without nuts. Plain hard candy, honey, jelly, molasses, syrup, sugar, chocolate syrup, gumdrops, marshmallows. Fats and Oils  Recommended: Limit fats to less than 8 tsp per day.  Avoid: Seeds, nuts, olives, avocados. Margarine, butter, cream, mayonnaise, salad oils, plain salad dressings. Plain gravy, crisp bacon without rind. Beverages  Recommended: Water, decaffeinated teas, oral rehydration solutions, sugar-free beverages not sweetened with sugar alcohols.  Avoid: Fruit juices, caffeinated beverages (coffee, tea, soda), alcohol, sports drinks, or lemon-lime soda. Condiments  Recommended: Ketchup, mustard, horseradish, vinegar, cocoa powder. Spices in moderation: allspice, basil, bay leaves, celery powder or leaves, cinnamon, cumin powder, curry powder, ginger, mace, marjoram, onion or garlic powder, oregano, paprika, parsley flakes, ground pepper, rosemary, sage, savory, tarragon,  thyme, turmeric.  Avoid: Coconut, honey. Document Released: 04/10/2003 Document Revised: 10/13/2011 Document Reviewed: 06/04/2011 [facility] Patient Information 2014 [place].

## 2013-02-05 NOTE — Assessment & Plan Note (Signed)
toradol 60mg  Im in office

## 2013-02-05 NOTE — Progress Notes (Signed)
   Subjective:    Patient ID: Sandra Riley, female    DOB: [birth year], 53 y.o.   MRN: [id number]  HPI The PT is here for follow up and re-evaluation of chronic medical conditions, medication management and review of any available recent lab and radiology data.  Preventive health is updated, specifically  Cancer screening and Immunization.   Has upcoming neurology appt and will need to discuss recent seizure activity The PT denies any adverse reactions to current medications since the last visit. C/o inadequate sleep and inadequate control of her symptoms of anxiety and depression. Headache, poor appetite, loose stools x 5 days, feels weak. Still has poor appetite but BM down to 2 per day and firming up bur still  Nausea and poor appetite.No fever, chills or urinary symptomms. Has headache frontal rated at an 8 , tightness also nausea     Review of Systems See HPI Denies recent fever or chills. Denies sinus pressure, nasal congestion, ear pain or sore throat. Denies chest congestion, productive cough or wheezing. Denies chest pains, palpitations and leg swelling   Denies dysuria, frequency, hesitancy or incontinence. Denies joint pain, swelling and limitation in mobility.  Denies skin break down or rash.        Objective:   Physical Exam BP 136/78  Pulse 98  Resp 18  Ht 5\' 3"  (1.6 m)  Wt 121 lb (54.885 kg)  BMI 21.44 kg/m2  SpO2 99% Patient alert and oriented and in no cardiopulmonary distress.  HEENT: No facial asymmetry, EOMI, no sinus tenderness,  oropharynx pink and moist.  Neck supple no adenopathy.  Chest: Clear to auscultation bilaterally.  CVS: S1, S2 no murmurs, no S3.  ABD: Soft non tender. Bowel sounds normal.  Ext: No edema  MS: Adequate ROM spine, shoulders, hips and knees.  Skin: Intact, no ulcerations or rash noted.  Psych: Good eye contact, normal affect. Memory intact mildly  anxious and  depressed appearing.  CNS: CN 2-12 intact, power, tone  and sensation normal throughout.        Assessment & Plan:  Headache(784.0) toradol 60mg  Im in office  Nausea alone zofran in office and prescribed for a short time  ANXIETY DEPRESSION Pt not suicidal or homicidal,Not as well controlled as desired, will increase dose of medication,no interest in therapy  SEIZURE DISORDER unconbtrolled per her report , reports seizure activity in the past week despite med adherence, a has upcoming appt with treating neurologist i the next 2 weeks and will have this addressed further at that time Advised ED if she devlops uncontrolled seizure activity,  Which has never been the case in the approx 16 years during which she has been in my care  Gastroenteritis/enteritis Mild symptoms, primarily of nausea , medication and info on the illness and Brat provided  Vitamin D deficiency Daily vit D to be continued

## 2013-02-06 MED ORDER — ONDANSETRON HCL 4 MG/2ML IJ SOLN
4.0000 mg | Freq: Once | INTRAMUSCULAR | Status: AC
  Administered 2013-02-05: 4 mg via INTRAMUSCULAR

## 2013-02-06 MED ORDER — KETOROLAC TROMETHAMINE 60 MG/2ML IM SOLN
60.0000 mg | Freq: Once | INTRAMUSCULAR | Status: AC
  Administered 2013-02-05: 60 mg via INTRAMUSCULAR

## 2013-02-15 DIAGNOSIS — G4700 Insomnia, unspecified: Secondary | ICD-10-CM | POA: Diagnosis not present

## 2013-02-15 DIAGNOSIS — R569 Unspecified convulsions: Secondary | ICD-10-CM | POA: Diagnosis not present

## 2013-02-15 DIAGNOSIS — Z79899 Other long term (current) drug therapy: Secondary | ICD-10-CM | POA: Diagnosis not present

## 2013-03-23 DIAGNOSIS — K529 Noninfective gastroenteritis and colitis, unspecified: Secondary | ICD-10-CM | POA: Insufficient documentation

## 2013-03-23 NOTE — Assessment & Plan Note (Addendum)
unconbtrolled per her report , reports seizure activity in the past week despite med adherence, a has upcoming appt with treating neurologist i the next 2 weeks and will have this addressed further at that time Advised ED if she devlops uncontrolled seizure activity,  Which has never been the case in the approx 16 years during which she has been in my care

## 2013-03-23 NOTE — Assessment & Plan Note (Signed)
zofran in office and prescribed for a short time

## 2013-03-23 NOTE — Assessment & Plan Note (Signed)
Daily vit D to be continued

## 2013-03-23 NOTE — Assessment & Plan Note (Signed)
Mild symptoms, primarily of nausea , medication and info on the illness and Brat provided

## 2013-03-23 NOTE — Assessment & Plan Note (Addendum)
Pt not suicidal or homicidal,Not as well controlled as desired, will increase dose of medication,no interest in therapy

## 2013-04-03 ENCOUNTER — Other Ambulatory Visit: Payer: Self-pay | Admitting: Family Medicine

## 2013-04-03 DIAGNOSIS — Z1231 Encounter for screening mammogram for malignant neoplasm of breast: Secondary | ICD-10-CM

## 2013-04-09 ENCOUNTER — Ambulatory Visit (HOSPITAL_COMMUNITY)
Admission: RE | Admit: 2013-04-09 | Discharge: 2013-04-09 | Disposition: A | Discharge status: Home / Self Care | Payer: Medicare Other | Source: Ambulatory Visit | Attending: Family Medicine | Admitting: Family Medicine

## 2013-04-09 DIAGNOSIS — Z1231 Encounter for screening mammogram for malignant neoplasm of breast: Secondary | ICD-10-CM | POA: Diagnosis not present

## 2013-04-12 DIAGNOSIS — G4700 Insomnia, unspecified: Secondary | ICD-10-CM | POA: Diagnosis not present

## 2013-04-12 DIAGNOSIS — Z79899 Other long term (current) drug therapy: Secondary | ICD-10-CM | POA: Diagnosis not present

## 2013-04-12 DIAGNOSIS — R569 Unspecified convulsions: Secondary | ICD-10-CM | POA: Diagnosis not present

## 2013-04-26 ENCOUNTER — Ambulatory Visit (INDEPENDENT_AMBULATORY_CARE_PROVIDER_SITE_OTHER): Payer: Medicare Other | Admitting: Family Medicine

## 2013-04-26 ENCOUNTER — Encounter (INDEPENDENT_AMBULATORY_CARE_PROVIDER_SITE_OTHER): Payer: Self-pay

## 2013-04-26 ENCOUNTER — Encounter: Payer: Self-pay | Admitting: Family Medicine

## 2013-04-26 VITALS — BP 120/74 | HR 74 | Resp 18 | Ht 63.0 in | Wt 108.0 lb

## 2013-04-26 DIAGNOSIS — G4700 Insomnia, unspecified: Secondary | ICD-10-CM

## 2013-04-26 DIAGNOSIS — E871 Hypo-osmolality and hyponatremia: Secondary | ICD-10-CM

## 2013-04-26 DIAGNOSIS — J301 Allergic rhinitis due to pollen: Secondary | ICD-10-CM

## 2013-04-26 DIAGNOSIS — F341 Dysthymic disorder: Secondary | ICD-10-CM

## 2013-04-26 DIAGNOSIS — R569 Unspecified convulsions: Secondary | ICD-10-CM

## 2013-04-26 DIAGNOSIS — R634 Abnormal weight loss: Secondary | ICD-10-CM | POA: Insufficient documentation

## 2013-04-26 DIAGNOSIS — Z13820 Encounter for screening for osteoporosis: Secondary | ICD-10-CM

## 2013-04-26 LAB — CBC
HEMATOCRIT: 41.4 % (ref 36.0–46.0)
HEMOGLOBIN: 14.7 g/dL (ref 12.0–15.0)
MCH: 31.9 pg (ref 26.0–34.0)
MCHC: 35.5 g/dL (ref 30.0–36.0)
MCV: 89.8 fL (ref 78.0–100.0)
Platelets: 401 10*3/uL — ABNORMAL HIGH (ref 150–400)
RBC: 4.61 MIL/uL (ref 3.87–5.11)
RDW: 13.1 % (ref 11.5–15.5)
WBC: 3.9 10*3/uL — ABNORMAL LOW (ref 4.0–10.5)

## 2013-04-26 MED ORDER — MEGESTROL ACETATE 40 MG PO TABS: 40.0000 mg | ORAL_TABLET | Freq: Every day | ORAL | Status: DC

## 2013-04-26 NOTE — Patient Instructions (Signed)
Complete exam in 2.5 month, call if you ned me before   cBC  And CMP today  You are referred for a bone density test  NEW medication to help with appetite is megace 40mg  one daily

## 2013-04-26 NOTE — Progress Notes (Signed)
   Subjective:    Patient ID: Sandra Riley, female    DOB: 12-22-1960, 53 y.o.   MRN: [id number]  HPI The PT is here for follow up and re-evaluation of chronic medical conditions, medication management and review of any available recent lab and radiology data.  Preventive health is updated, specifically  Cancer screening and Immunization.    C/o early satiety and poor appetite with weight loss, wants medication to help with this. Denies depression, change in BM or rectal blood. Cancer screening tests are up to date. States she had a seizure in the past month, no trauma to body noted, will see neurology in the next several weeks      Review of Systems See HPI Denies recent fever or chills. Denies sinus pressure, nasal congestion, ear pain or sore throat. Denies chest congestion, productive cough or wheezing. Denies chest pains, palpitations and leg swelling Denies abdominal pain, nausea, vomiting,diarrhea or constipation.   Denies dysuria, frequency, hesitancy or incontinence. Denies joint pain, swelling and limitation in mobility. . Denies depression, anxiety or insomnia. Denies skin break down or rash.        Objective:   Physical Exam BP 120/74  Pulse 74  Resp 18  Ht 5\' 3"  (1.6 m)  Wt 108 lb (48.988 kg)  BMI 19.14 kg/m2  SpO2 96% Patient alert and oriented and in no cardiopulmonary distress.Under nourished  HEENT: No facial asymmetry, EOMI, no sinus tenderness,  oropharynx pink and moist.  Neck supple no adenopathy.  Chest: Clear to auscultation bilaterally.  CVS: S1, S2 no murmurs, no S3.  ABD: Soft non tender. Bowel sounds normal.  Ext: No edema  MS: Adequate ROM spine, shoulders, hips and knees.  Skin: Intact, no ulcerations or rash noted.  Psych: Good eye contact, normal affect. Memory intact not anxious or depressed appearing.  CNS: CN 2-12 intact, power, tone and sensation normal throughout.        Assessment & Plan:  Unexplained weight  loss Cancer screening tests are up to date. Pt to take megace as an apetite stimulant  SEIZURE DISORDER Reports seizure activity o on one occasion in the past 1 month. \Treated for seizures by neurology, and has upcoming appointment with neurologist in the next month. No h/o associated trauma with seizure   ANXIETY DEPRESSION Controlled, no change in medication   INSOMNIA UNSPECIFIED Sleep hygiene reviewed and written information offered also. Prescription sent for  medication needed.   ALLERGIC RHINITIS, SEASONAL Increased symptoms controlled by OTC meds, continue same

## 2013-04-27 LAB — COMPREHENSIVE METABOLIC PANEL
ALBUMIN: 4.7 g/dL (ref 3.5–5.2)
ALK PHOS: 153 U/L — AB (ref 39–117)
ALT: 22 U/L (ref 0–35)
AST: 32 U/L (ref 0–37)
BILIRUBIN TOTAL: 0.4 mg/dL (ref 0.2–1.2)
BUN: 4 mg/dL — ABNORMAL LOW (ref 6–23)
CO2: 27 mEq/L (ref 19–32)
CREATININE: 0.51 mg/dL (ref 0.50–1.10)
Calcium: 10.4 mg/dL (ref 8.4–10.5)
Chloride: 90 mEq/L — ABNORMAL LOW (ref 96–112)
GLUCOSE: 82 mg/dL (ref 70–99)
POTASSIUM: 4.4 meq/L (ref 3.5–5.3)
Sodium: 127 mEq/L — ABNORMAL LOW (ref 135–145)
Total Protein: 8.2 g/dL (ref 6.0–8.3)

## 2013-04-29 NOTE — Assessment & Plan Note (Signed)
Reports seizure activity o on one occasion in the past 1 month. \Treated for seizures by neurology, and has upcoming appointment with neurologist in the next month. No h/o associated trauma with seizure

## 2013-04-29 NOTE — Assessment & Plan Note (Signed)
Sleep hygiene reviewed and written information offered also. Prescription sent for  medication needed.  

## 2013-04-29 NOTE — Assessment & Plan Note (Signed)
Cancer screening tests are up to date. Pt to take megace as an apetite stimulant

## 2013-04-29 NOTE — Assessment & Plan Note (Signed)
Increased symptoms controlled by OTC meds, continue same

## 2013-04-29 NOTE — Assessment & Plan Note (Signed)
Controlled, no change in medication  

## 2013-05-02 DIAGNOSIS — E871 Hypo-osmolality and hyponatremia: Secondary | ICD-10-CM | POA: Diagnosis not present

## 2013-05-02 LAB — BASIC METABOLIC PANEL
BUN: 6 mg/dL (ref 6–23)
CO2: 30 meq/L (ref 19–32)
CREATININE: 0.48 mg/dL — AB (ref 0.50–1.10)
Calcium: 10.1 mg/dL (ref 8.4–10.5)
Chloride: 100 mEq/L (ref 96–112)
GLUCOSE: 79 mg/dL (ref 70–99)
Potassium: 3.7 mEq/L (ref 3.5–5.3)
Sodium: 143 mEq/L (ref 135–145)

## 2013-05-10 ENCOUNTER — Other Ambulatory Visit (HOSPITAL_COMMUNITY): Payer: Medicare Other

## 2013-05-17 ENCOUNTER — Ambulatory Visit (HOSPITAL_COMMUNITY)
Admission: RE | Admit: 2013-05-17 | Discharge: 2013-05-17 | Disposition: A | Discharge status: Home / Self Care | Payer: Medicare Other | Source: Ambulatory Visit | Attending: Family Medicine | Admitting: Family Medicine

## 2013-05-17 DIAGNOSIS — Z13820 Encounter for screening for osteoporosis: Secondary | ICD-10-CM | POA: Diagnosis not present

## 2013-05-17 DIAGNOSIS — M949 Disorder of cartilage, unspecified: Secondary | ICD-10-CM | POA: Diagnosis not present

## 2013-05-17 DIAGNOSIS — M899 Disorder of bone, unspecified: Secondary | ICD-10-CM | POA: Diagnosis not present

## 2013-05-21 ENCOUNTER — Encounter: Payer: Self-pay | Admitting: Family Medicine

## 2013-05-21 DIAGNOSIS — M8580 Other specified disorders of bone density and structure, unspecified site: Secondary | ICD-10-CM | POA: Insufficient documentation

## 2013-05-22 ENCOUNTER — Other Ambulatory Visit: Payer: Self-pay

## 2013-05-22 DIAGNOSIS — M899 Disorder of bone, unspecified: Secondary | ICD-10-CM

## 2013-05-22 DIAGNOSIS — M949 Disorder of cartilage, unspecified: Principal | ICD-10-CM

## 2013-05-29 DIAGNOSIS — M899 Disorder of bone, unspecified: Secondary | ICD-10-CM | POA: Diagnosis not present

## 2013-05-30 LAB — VITAMIN D 25 HYDROXY (VIT D DEFICIENCY, FRACTURES): Vit D, 25-Hydroxy: 57 ng/mL (ref 30–89)

## 2013-06-19 ENCOUNTER — Encounter (INDEPENDENT_AMBULATORY_CARE_PROVIDER_SITE_OTHER): Payer: Self-pay

## 2013-06-19 ENCOUNTER — Ambulatory Visit (INDEPENDENT_AMBULATORY_CARE_PROVIDER_SITE_OTHER): Payer: Medicare Other | Admitting: Family Medicine

## 2013-06-19 ENCOUNTER — Other Ambulatory Visit (HOSPITAL_COMMUNITY)
Admission: RE | Admit: 2013-06-19 | Discharge: 2013-06-19 | Disposition: A | Discharge status: Home / Self Care | Payer: Medicare Other | Source: Ambulatory Visit | Attending: Family Medicine | Admitting: Family Medicine

## 2013-06-19 ENCOUNTER — Encounter: Payer: Self-pay | Admitting: Family Medicine

## 2013-06-19 VITALS — BP 120/74 | HR 84 | Resp 16 | Ht 62.0 in | Wt 111.8 lb

## 2013-06-19 DIAGNOSIS — Z01419 Encounter for gynecological examination (general) (routine) without abnormal findings: Secondary | ICD-10-CM | POA: Diagnosis not present

## 2013-06-19 DIAGNOSIS — Z1151 Encounter for screening for human papillomavirus (HPV): Secondary | ICD-10-CM | POA: Diagnosis not present

## 2013-06-19 DIAGNOSIS — Z1211 Encounter for screening for malignant neoplasm of colon: Secondary | ICD-10-CM

## 2013-06-19 DIAGNOSIS — Z1272 Encounter for screening for malignant neoplasm of vagina: Secondary | ICD-10-CM

## 2013-06-19 DIAGNOSIS — Z79899 Other long term (current) drug therapy: Secondary | ICD-10-CM

## 2013-06-19 DIAGNOSIS — Z124 Encounter for screening for malignant neoplasm of cervix: Secondary | ICD-10-CM | POA: Diagnosis not present

## 2013-06-19 DIAGNOSIS — R634 Abnormal weight loss: Secondary | ICD-10-CM

## 2013-06-19 DIAGNOSIS — Z0000 Encounter for general adult medical examination without abnormal findings: Secondary | ICD-10-CM

## 2013-06-19 DIAGNOSIS — F341 Dysthymic disorder: Secondary | ICD-10-CM

## 2013-06-19 LAB — HEMOCCULT GUIAC POC 1CARD (OFFICE): FECAL OCCULT BLD: NEGATIVE

## 2013-06-19 MED ORDER — MIRTAZAPINE 30 MG PO TBDP: 30.0000 mg | ORAL_TABLET | Freq: Every day | ORAL | Status: DC

## 2013-06-19 MED ORDER — MEGESTROL ACETATE 40 MG PO TABS: 40.0000 mg | ORAL_TABLET | Freq: Every day | ORAL | Status: DC

## 2013-06-19 NOTE — Patient Instructions (Addendum)
F/u early November, call if you need me before  Call for flu vaccine in September  Medications will be refilled.  No changes in any of your medication at this time since they are working well for you  Pls call for referral for eye exam when you decide on this  Fasting lipid, chem 7 in early November, 2 to 3 days before visit

## 2013-06-19 NOTE — Assessment & Plan Note (Signed)
Pelvic and breast exam as documented.  Pap sent

## 2013-08-05 NOTE — Progress Notes (Signed)
   Subjective:    Patient ID: Sandra Riley, female    DOB: 10/26/1960, 53 y.o.   MRN: [id number]  HPI Patient is in for pelvic and breast exam. No other health concerns are expressed or addressed at the visit.    Review of Systems See HPI     Objective:   Physical Exam  BP 120/74  Pulse 84  Resp 16  Ht 5\' 2"  (1.575 m)  Wt 111 lb 12.8 oz (50.712 kg)  BMI 20.44 kg/m2  SpO2 99% Pleasant well nourished female, alert and oriented x 3, in no cardio-pulmonary distress.    Breast: No asymetry,no masses or lumps. No tenderness. No nipple discharge or inversion. No axillary or supraclavicular adenopathy     Rectal:  Normal sphincter tone. No mass.No rectal masses.  Guaiac negative stool.  GU: External genitalia normal female genitalia , female distribution of hair. No lesions. Urethral meatus normal in size, no  Prolapse, no lesions visibly  Present. Bladder non tender. Vagina pink and moist , with no visible lesions , physiologic discharge present . Adequate pelvic support no  cystocele or rectocele noted  Uterus absent, no adnexal masses, no  adnexal tenderness.          Assessment & Plan:  Routine general medical examination at a health care facility Pelvic and breast exam as documented.  Pap sent

## 2013-11-13 ENCOUNTER — Other Ambulatory Visit: Payer: Self-pay | Admitting: Family Medicine

## 2013-12-03 ENCOUNTER — Encounter: Payer: Self-pay | Admitting: Family Medicine

## 2013-12-03 ENCOUNTER — Encounter (INDEPENDENT_AMBULATORY_CARE_PROVIDER_SITE_OTHER): Payer: Self-pay

## 2013-12-03 ENCOUNTER — Ambulatory Visit (INDEPENDENT_AMBULATORY_CARE_PROVIDER_SITE_OTHER): Payer: Medicare Other | Admitting: Family Medicine

## 2013-12-03 VITALS — BP 126/78 | HR 82 | Resp 18 | Ht 62.0 in | Wt 130.1 lb

## 2013-12-03 DIAGNOSIS — J301 Allergic rhinitis due to pollen: Secondary | ICD-10-CM

## 2013-12-03 DIAGNOSIS — R634 Abnormal weight loss: Secondary | ICD-10-CM | POA: Diagnosis not present

## 2013-12-03 DIAGNOSIS — G4700 Insomnia, unspecified: Secondary | ICD-10-CM

## 2013-12-03 DIAGNOSIS — F341 Dysthymic disorder: Secondary | ICD-10-CM

## 2013-12-03 DIAGNOSIS — R569 Unspecified convulsions: Secondary | ICD-10-CM

## 2013-12-03 DIAGNOSIS — M8580 Other specified disorders of bone density and structure, unspecified site: Secondary | ICD-10-CM

## 2013-12-03 DIAGNOSIS — Z79899 Other long term (current) drug therapy: Secondary | ICD-10-CM | POA: Diagnosis not present

## 2013-12-03 MED ORDER — CLONAZEPAM 0.5 MG PO TABS: ORAL_TABLET | ORAL | Status: DC

## 2013-12-03 MED ORDER — MIRTAZAPINE 30 MG PO TBDP: 30.0000 mg | ORAL_TABLET | Freq: Every day | ORAL | Status: DC

## 2013-12-03 MED ORDER — PHENOBARBITAL 100 MG PO TABS: 100.0000 mg | ORAL_TABLET | Freq: Two times a day (BID) | ORAL | Status: DC

## 2013-12-03 MED ORDER — LEVETIRACETAM 500 MG PO TABS
ORAL_TABLET | ORAL | Status: DC
Start: 2013-12-03 — End: 2016-03-30

## 2013-12-03 NOTE — Patient Instructions (Addendum)
Annual wellness in 3 months, with Prevnar, call if you need me before  STOP megace after you finish what you have.  Thankful that you are doing well  You will be contacted, if needed , re recent labs

## 2013-12-04 LAB — BASIC METABOLIC PANEL
BUN: 11 mg/dL (ref 6–23)
CALCIUM: 10.1 mg/dL (ref 8.4–10.5)
CO2: 24 meq/L (ref 19–32)
Chloride: 104 mEq/L (ref 96–112)
Creat: 0.53 mg/dL (ref 0.50–1.10)
Glucose, Bld: 83 mg/dL (ref 70–99)
Potassium: 4.6 mEq/L (ref 3.5–5.3)
SODIUM: 137 meq/L (ref 135–145)

## 2013-12-04 LAB — LIPID PANEL
Cholesterol: 131 mg/dL (ref 0–200)
HDL: 52 mg/dL (ref >39)
LDL CALC: 66 mg/dL (ref 0–99)
Total CHOL/HDL Ratio: 2.5 Ratio
Triglycerides: 63 mg/dL (ref <150)
VLDL: 13 mg/dL (ref 0–40)

## 2013-12-04 NOTE — Assessment & Plan Note (Signed)
Currently asymptomatic 

## 2013-12-04 NOTE — Assessment & Plan Note (Signed)
Resolved, discontinue megace

## 2013-12-04 NOTE — Assessment & Plan Note (Signed)
Importance of daily weight bearing execise and adequate calcium and vit D supplementation is stressed

## 2013-12-04 NOTE — Addendum Note (Signed)
Addended by: Tula Nakayama E on: 12/04/2013 05:45 AM   Modules accepted: Level of Service

## 2013-12-04 NOTE — Assessment & Plan Note (Signed)
Sleep hygiene reviewed and written information offered also. Prescription sent for  medication needed.  

## 2013-12-04 NOTE — Assessment & Plan Note (Signed)
Controlled, no change in medication Pt denies depression

## 2013-12-04 NOTE — Progress Notes (Signed)
   Subjective:    Patient ID: Sandra Riley, female    DOB: 1961/01/11, 53 y.o.   MRN: [id number]  HPI The PT is here for follow up and re-evaluation of chronic medical conditions, medication management and review of any available recent lab and radiology data.  Preventive health is updated, specifically  Cancer screening and Immunization.   Questions or concerns regarding consultations or procedures which the PT has had in the interim are  Addressed.Follows with neurology regularly, denies any  Recent seizure activity The PT denies any adverse reactions to current medications since the last visit.  There are no new concerns.  There are no specific complaints       Review of Systems See HPI Denies recent fever or chills. Denies sinus pressure, nasal congestion, ear pain or sore throat. Denies chest congestion, productive cough or wheezing. Denies chest pains, palpitations and leg swelling Denies abdominal pain, nausea, vomiting,diarrhea or constipation.   Denies dysuria, frequency, hesitancy or incontinence. Denies joint pain, swelling and limitation in mobility. Denies headaches, seizures, numbness, or tingling. Denies depression, anxiety or insomnia. Denies skin break down or rash.        Objective:   Physical Exam BP 126/78 mmHg  Pulse 82  Resp 18  Ht 5\' 2"  (1.575 m)  Wt 130 lb 1.9 oz (59.022 kg)  BMI 23.79 kg/m2  SpO2 98% Patient alert and oriented and in no cardiopulmonary distress.  HEENT: No facial asymmetry, EOMI,   oropharynx pink and moist.  Neck supple no JVD, no mass.  Chest: Clear to auscultation bilaterally.  CVS: S1, S2 no murmurs, no S3.Regular rate.  ABD: Soft non tender.   Ext: No edema  MS: Adequate ROM spine, shoulders, hips and knees.  Skin: Intact, no ulcerations or rash noted.  Psych: Good eye contact, normal affect. Memory intact not anxious or depressed appearing.  CNS: CN 2-12 intact, power,  normal throughout.no focal deficits  noted.        Assessment & Plan:

## 2013-12-04 NOTE — Assessment & Plan Note (Signed)
controlled on kepra and managed by neurology

## 2013-12-10 DIAGNOSIS — R569 Unspecified convulsions: Secondary | ICD-10-CM | POA: Diagnosis not present

## 2013-12-10 DIAGNOSIS — Z79899 Other long term (current) drug therapy: Secondary | ICD-10-CM | POA: Diagnosis not present

## 2014-02-07 DIAGNOSIS — E538 Deficiency of other specified B group vitamins: Secondary | ICD-10-CM | POA: Diagnosis not present

## 2014-02-07 DIAGNOSIS — Z79899 Other long term (current) drug therapy: Secondary | ICD-10-CM | POA: Diagnosis not present

## 2014-02-07 DIAGNOSIS — R569 Unspecified convulsions: Secondary | ICD-10-CM | POA: Diagnosis not present

## 2014-02-07 DIAGNOSIS — R030 Elevated blood-pressure reading, without diagnosis of hypertension: Secondary | ICD-10-CM | POA: Diagnosis not present

## 2014-02-25 DIAGNOSIS — E538 Deficiency of other specified B group vitamins: Secondary | ICD-10-CM | POA: Diagnosis not present

## 2014-02-25 DIAGNOSIS — E559 Vitamin D deficiency, unspecified: Secondary | ICD-10-CM | POA: Diagnosis not present

## 2014-02-25 DIAGNOSIS — Z79899 Other long term (current) drug therapy: Secondary | ICD-10-CM | POA: Diagnosis not present

## 2014-02-25 DIAGNOSIS — M810 Age-related osteoporosis without current pathological fracture: Secondary | ICD-10-CM | POA: Diagnosis not present

## 2014-02-25 DIAGNOSIS — R5383 Other fatigue: Secondary | ICD-10-CM | POA: Diagnosis not present

## 2014-03-11 ENCOUNTER — Other Ambulatory Visit: Payer: Self-pay | Admitting: Family Medicine

## 2014-03-11 DIAGNOSIS — Z1231 Encounter for screening mammogram for malignant neoplasm of breast: Secondary | ICD-10-CM

## 2014-03-12 ENCOUNTER — Ambulatory Visit (INDEPENDENT_AMBULATORY_CARE_PROVIDER_SITE_OTHER): Payer: Medicare Other | Admitting: Family Medicine

## 2014-03-12 ENCOUNTER — Encounter: Payer: Self-pay | Admitting: Family Medicine

## 2014-03-12 VITALS — BP 138/84 | HR 84 | Resp 16 | Ht 62.0 in | Wt 126.0 lb

## 2014-03-12 DIAGNOSIS — Z23 Encounter for immunization: Secondary | ICD-10-CM | POA: Diagnosis not present

## 2014-03-12 DIAGNOSIS — Z0000 Encounter for general adult medical examination without abnormal findings: Secondary | ICD-10-CM | POA: Insufficient documentation

## 2014-03-12 DIAGNOSIS — H547 Unspecified visual loss: Secondary | ICD-10-CM

## 2014-03-12 MED ORDER — VENLAFAXINE HCL ER 37.5 MG PO CP24: 37.5000 mg | ORAL_CAPSULE | Freq: Every day | ORAL | Status: DC

## 2014-03-12 NOTE — Progress Notes (Signed)
Subjective:    Patient ID: Sandra Riley, female    DOB: 1961-01-26, 54 y.o.   MRN: [id number]  HPI Preventive Screening-Counseling & Management   Patient present here today for a  Subsequent Medicare annual wellness visit.   Current Problems (verified)   Medications Prior to Visit Allergies (verified)   PAST HISTORY  Family History (verified)   Social History Married for 54 years 3 sons and she is disabled. Never smoked    Risk Factors  Current exercise habits:  Does 50 sit ups a few times a week as well as walk. Tries to walk a mile or more each time   Dietary issues discussed: Heart healthy diet discussed, eat more fruits and vegetables and limit fried fatty foods    Cardiac risk factors: Father CAD, brother MI at age 67  Depression Screen  (Note: if answer to either of the following is "Yes", a more complete depression screening is indicated)   Over the past two weeks, have you felt down, depressed or hopeless? No  Over the past two weeks, have you felt little interest or pleasure in doing things? No  Have you lost interest or pleasure in daily life? No  Do you often feel hopeless? No  Do you cry easily over simple problems? No   Activities of Daily Living  In your present state of health, do you have any difficulty performing the following activities?  Driving?: doesn't drive, depends on family members  Managing money?: No Feeding yourself?:No Getting from bed to chair?:No Climbing a flight of stairs?:No Preparing food and eating?:No Bathing or showering?:No Getting dressed?:No Getting to the toilet?:No Using the toilet?:No Moving around from place to place?: No  Fall Risk Assessment In the past year have you fallen or had a near fall?:yes, fell out of bed  Are you currently taking any medications that make you dizzy?:No   Hearing Difficulties: No Do you often ask people to speak up or repeat themselves?:No Do you experience ringing or noises in  your ears?:No Do you have difficulty understanding soft or whispered voices?:No  Cognitive Testing  Alert? Yes Normal Appearance?Yes  Oriented to person? Yes Place? Yes  Time? Yes  Displays appropriate judgment?Yes  Can read the correct time from a watch face? yes Are you having problems remembering things?No  Advanced Directives have been discussed with the patient?Yes, doesn't have a living will yet but would like information , full code , needs to work on living will   List the Names of Other Physician/Practitioners you currently use:  Dr Merlene Laughter (neuro)    Indicate any recent Medical Services you may have received from other than [facility] providers in the past year (date may be approximate).   Assessment:    Annual Wellness Exam   Plan:    Medicare Attestation  I have personally reviewed:  The patient's medical and social history  Their use of alcohol, tobacco or illicit drugs  Their current medications and supplements  The patient's functional ability including ADLs,fall risks, home safety risks, cognitive, and hearing and visual impairment  Diet and physical activities  Evidence for depression or mood disorders  The patient's weight, height, BMI, and visual acuity have been recorded in the chart. I have made referrals, counseling, and provided education to the patient based on review of the above and I have provided the patient with a written personalized care plan for preventive services.      Review of Systems     Objective:  Physical Exam  BP 138/84 mmHg  Pulse 84  Resp 16  Ht 5\' 2"  (1.575 m)  Wt 126 lb (57.153 kg)  BMI 23.04 kg/m2  SpO2 97%       Assessment & Plan:  Reduced vision Reduced vision refer for eye exam   Medicare annual wellness visit, subsequent Annual exam as documented. Counseling done  re healthy lifestyle involving commitment to 150 minutes exercise per week, heart healthy diet, and attaining healthy weight.The importance of  adequate sleep also discussed. Regular seat belt use and home safety, is also discussed. Changes in health habits are decided on by the patient with goals and time frames  set for achieving them. Immunization and cancer screening needs are specifically addressed at this visit.    Need for vaccination with 13-polyvalent pneumococcal conjugate vaccine Vaccine administered at visit.

## 2014-03-12 NOTE — Patient Instructions (Signed)
Annual physical exam in 6.5 month, call if you need me before  You are referred to Dr Iona Hansen for eye exam  You are doing well, keep it up  New to help with hot flashes is effexor one daily, dress loosely, keep home cool, and drink more cold than hot beverages  Keep active and, and eat healthily

## 2014-03-12 NOTE — Assessment & Plan Note (Signed)
Reduced vision refer for eye exam

## 2014-03-24 DIAGNOSIS — Z23 Encounter for immunization: Secondary | ICD-10-CM | POA: Insufficient documentation

## 2014-03-24 NOTE — Assessment & Plan Note (Signed)

## 2014-03-24 NOTE — Assessment & Plan Note (Signed)
Vaccine administered at visit.  

## 2014-04-12 ENCOUNTER — Ambulatory Visit (HOSPITAL_COMMUNITY)
Admission: RE | Admit: 2014-04-12 | Discharge: 2014-04-12 | Disposition: A | Discharge status: Home / Self Care | Payer: Medicare Other | Source: Ambulatory Visit | Attending: Family Medicine | Admitting: Family Medicine

## 2014-04-12 ENCOUNTER — Inpatient Hospital Stay (HOSPITAL_COMMUNITY): Admission: RE | Admit: 2014-04-12 | Payer: Medicare Other | Source: Ambulatory Visit

## 2014-04-12 DIAGNOSIS — Z1231 Encounter for screening mammogram for malignant neoplasm of breast: Secondary | ICD-10-CM | POA: Insufficient documentation

## 2014-06-10 DIAGNOSIS — R030 Elevated blood-pressure reading, without diagnosis of hypertension: Secondary | ICD-10-CM | POA: Diagnosis not present

## 2014-06-10 DIAGNOSIS — E538 Deficiency of other specified B group vitamins: Secondary | ICD-10-CM | POA: Diagnosis not present

## 2014-06-10 DIAGNOSIS — Z79899 Other long term (current) drug therapy: Secondary | ICD-10-CM | POA: Diagnosis not present

## 2014-06-10 DIAGNOSIS — R569 Unspecified convulsions: Secondary | ICD-10-CM | POA: Diagnosis not present

## 2014-08-21 ENCOUNTER — Ambulatory Visit (INDEPENDENT_AMBULATORY_CARE_PROVIDER_SITE_OTHER): Payer: Medicare Other | Admitting: Family Medicine

## 2014-08-21 ENCOUNTER — Encounter: Payer: Self-pay | Admitting: Family Medicine

## 2014-08-21 VITALS — BP 118/78 | HR 68 | Resp 18 | Ht 62.0 in | Wt 119.1 lb

## 2014-08-21 DIAGNOSIS — Z114 Encounter for screening for human immunodeficiency virus [HIV]: Secondary | ICD-10-CM

## 2014-08-21 DIAGNOSIS — Z1211 Encounter for screening for malignant neoplasm of colon: Secondary | ICD-10-CM

## 2014-08-21 DIAGNOSIS — E559 Vitamin D deficiency, unspecified: Secondary | ICD-10-CM

## 2014-08-21 DIAGNOSIS — Z0000 Encounter for general adult medical examination without abnormal findings: Secondary | ICD-10-CM | POA: Insufficient documentation

## 2014-08-21 DIAGNOSIS — M62838 Other muscle spasm: Secondary | ICD-10-CM

## 2014-08-21 DIAGNOSIS — Z79899 Other long term (current) drug therapy: Secondary | ICD-10-CM | POA: Diagnosis not present

## 2014-08-21 DIAGNOSIS — R634 Abnormal weight loss: Secondary | ICD-10-CM

## 2014-08-21 LAB — HEMOCCULT GUIAC POC 1CARD (OFFICE): Fecal Occult Blood, POC: NEGATIVE

## 2014-08-21 MED ORDER — METHOCARBAMOL 500 MG PO TABS: ORAL_TABLET | ORAL | Status: DC

## 2014-08-21 NOTE — Assessment & Plan Note (Signed)
New onset lower extremity spasm, on avg 2 to 3 timees weekly, muscle relaxant prescribed for as needed use

## 2014-08-21 NOTE — Progress Notes (Signed)
   Subjective:    Patient ID: Sandra Riley, female    DOB: 1960/11/25, 54 y.o.   MRN: [id number]  HPI Patient is in for annual physical exam. Cancer screening needs are reviewed and updated Immunization is reviewed , and  updated if needed. C/o muscle spasm in legs intermittently on average 3 times oper week, requests medication for this    Review of Systems See HPI     Objective:   Physical Exam BP 118/78 mmHg  Pulse 68  Resp 18  Ht 5\' 2"  (1.575 m)  Wt 119 lb 1.3 oz (54.014 kg)  BMI 21.77 kg/m2  SpO2 99% Pleasant well nourished female, alert and oriented x 3, in no cardio-pulmonary distress. Afebrile. HEENT No facial trauma or asymetry. Sinuses non tender.  Extra occullar muscles intact, External ears normal, tympanic membranes clear. Oropharynx moist, no exudate, upper and lower plates Neck: supple, no adenopathy,JVD or thyromegaly.No bruits.  Chest: Clear to ascultation bilaterally.No crackles or wheezes. Non tender to palpation  Breast: No asymetry,no masses or lumps. No tenderness. No nipple discharge or inversion. No axillary or supraclavicular adenopathy  Cardiovascular system; Heart sounds normal,  S1 and  S2 ,no S3.  No murmur, or thrill. Apical beat not displaced Peripheral pulses normal.  Abdomen: Soft, non tender, no organomegaly or masses. No bruits. Bowel sounds normal. No guarding, tenderness or rebound.  Rectal:  Normal sphincter tone. No mass.No rectal masses.  Guaiac negative stool.  GU: External genitalia normal female genitalia , female distribution of hair. No lesions. Urethral meatus normal in size, no  Prolapse, no lesions visibly  Present. Bladder non tender. Vagina pink and moist , with no visible lesions , discharge present . Adequate pelvic support no  cystocele or rectocele noted  Uterus absent, no adnexal masses, no adnexal tenderness.   Musculoskeletal exam: Full ROM of spine, hips , shoulders and knees. No  deformity ,swelling or crepitus noted. No muscle wasting or atrophy.   Neurologic: Cranial nerves 2 to 12 intact. Power, tone ,sensation and reflexes normal throughout. No disturbance in gait. No tremor.  Skin: Intact, no ulceration, erythema , scaling or rash noted. Pigmentation normal throughout  Psych; Normal mood and affect. Judgement and concentration normal        Assessment & Plan:

## 2014-08-21 NOTE — Addendum Note (Signed)
Addended by: Denman George B on: 08/21/2014 04:37 PM   Modules accepted: Orders

## 2014-08-21 NOTE — Patient Instructions (Addendum)
F/u in December, call for fluy vaccine in septemebr  New medication for muscle spasm , to be used only if needed,  30 tablets to last 3 months, so no more than  3 tablets per week Fasting cmp , lipid, tSH , CBC, and Vitamin D, HIV end November   Thanks for choosing [facility], we consider it a privelige to serve you.

## 2014-08-21 NOTE — Assessment & Plan Note (Signed)

## 2014-10-30 ENCOUNTER — Other Ambulatory Visit: Payer: Self-pay | Admitting: Family Medicine

## 2014-12-02 DIAGNOSIS — Z23 Encounter for immunization: Secondary | ICD-10-CM | POA: Diagnosis not present

## 2014-12-05 DIAGNOSIS — R569 Unspecified convulsions: Secondary | ICD-10-CM | POA: Diagnosis not present

## 2014-12-05 DIAGNOSIS — R030 Elevated blood-pressure reading, without diagnosis of hypertension: Secondary | ICD-10-CM | POA: Diagnosis not present

## 2014-12-05 DIAGNOSIS — E538 Deficiency of other specified B group vitamins: Secondary | ICD-10-CM | POA: Diagnosis not present

## 2014-12-05 DIAGNOSIS — Z79899 Other long term (current) drug therapy: Secondary | ICD-10-CM | POA: Diagnosis not present

## 2014-12-31 DIAGNOSIS — E559 Vitamin D deficiency, unspecified: Secondary | ICD-10-CM | POA: Diagnosis not present

## 2014-12-31 DIAGNOSIS — Z79899 Other long term (current) drug therapy: Secondary | ICD-10-CM | POA: Diagnosis not present

## 2014-12-31 DIAGNOSIS — Z1159 Encounter for screening for other viral diseases: Secondary | ICD-10-CM | POA: Diagnosis not present

## 2014-12-31 DIAGNOSIS — Z0000 Encounter for general adult medical examination without abnormal findings: Secondary | ICD-10-CM | POA: Diagnosis not present

## 2014-12-31 DIAGNOSIS — R634 Abnormal weight loss: Secondary | ICD-10-CM | POA: Diagnosis not present

## 2014-12-31 DIAGNOSIS — Z114 Encounter for screening for human immunodeficiency virus [HIV]: Secondary | ICD-10-CM | POA: Diagnosis not present

## 2015-01-01 LAB — LIPID PANEL
CHOL/HDL RATIO: 2.5 ratio (ref <=5.0)
Cholesterol: 130 mg/dL (ref 125–200)
HDL: 51 mg/dL (ref >=46)
LDL Cholesterol: 61 mg/dL (ref <130)
Triglycerides: 89 mg/dL (ref <150)
VLDL: 18 mg/dL (ref <30)

## 2015-01-01 LAB — COMPREHENSIVE METABOLIC PANEL
ALT: 17 U/L (ref 6–29)
AST: 24 U/L (ref 10–35)
Albumin: 4.1 g/dL (ref 3.6–5.1)
Alkaline Phosphatase: 100 U/L (ref 33–130)
BUN: 8 mg/dL (ref 7–25)
CO2: 29 mmol/L (ref 20–31)
Calcium: 9.5 mg/dL (ref 8.6–10.4)
Chloride: 101 mmol/L (ref 98–110)
Creat: 0.49 mg/dL — ABNORMAL LOW (ref 0.50–1.05)
GLUCOSE: 82 mg/dL (ref 65–99)
POTASSIUM: 4.4 mmol/L (ref 3.5–5.3)
Sodium: 137 mmol/L (ref 135–146)
Total Bilirubin: 0.3 mg/dL (ref 0.2–1.2)
Total Protein: 7.3 g/dL (ref 6.1–8.1)

## 2015-01-01 LAB — CBC
HEMATOCRIT: 39.2 % (ref 36.0–46.0)
Hemoglobin: 13.1 g/dL (ref 12.0–15.0)
MCH: 33.1 pg (ref 26.0–34.0)
MCHC: 33.4 g/dL (ref 30.0–36.0)
MCV: 99 fL (ref 78.0–100.0)
MPV: 8.9 fL (ref 8.6–12.4)
Platelets: 332 10*3/uL (ref 150–400)
RBC: 3.96 MIL/uL (ref 3.87–5.11)
RDW: 11.9 % (ref 11.5–15.5)
WBC: 4.5 10*3/uL (ref 4.0–10.5)

## 2015-01-01 LAB — TSH: TSH: 0.662 u[IU]/mL (ref 0.350–4.500)

## 2015-01-01 LAB — VITAMIN D 25 HYDROXY (VIT D DEFICIENCY, FRACTURES): VIT D 25 HYDROXY: 52 ng/mL (ref 30–100)

## 2015-01-01 LAB — HIV ANTIBODY (ROUTINE TESTING W REFLEX): HIV 1&2 Ab, 4th Generation: NONREACTIVE

## 2015-01-21 ENCOUNTER — Encounter: Payer: Self-pay | Admitting: Family Medicine

## 2015-01-21 ENCOUNTER — Ambulatory Visit (INDEPENDENT_AMBULATORY_CARE_PROVIDER_SITE_OTHER): Payer: Medicare Other | Admitting: Family Medicine

## 2015-01-21 VITALS — BP 128/78 | HR 76 | Resp 18 | Ht 62.0 in | Wt 117.0 lb

## 2015-01-21 DIAGNOSIS — F341 Dysthymic disorder: Secondary | ICD-10-CM

## 2015-01-21 DIAGNOSIS — G4700 Insomnia, unspecified: Secondary | ICD-10-CM

## 2015-01-21 DIAGNOSIS — M62838 Other muscle spasm: Secondary | ICD-10-CM | POA: Insufficient documentation

## 2015-01-21 DIAGNOSIS — Z1159 Encounter for screening for other viral diseases: Secondary | ICD-10-CM | POA: Diagnosis not present

## 2015-01-21 DIAGNOSIS — R569 Unspecified convulsions: Secondary | ICD-10-CM

## 2015-01-21 DIAGNOSIS — E559 Vitamin D deficiency, unspecified: Secondary | ICD-10-CM

## 2015-01-21 MED ORDER — CYCLOBENZAPRINE HCL 10 MG PO TABS: ORAL_TABLET | ORAL | Status: DC

## 2015-01-21 NOTE — Progress Notes (Signed)
   Subjective:    Patient ID: Sandra Riley, female    DOB: [birth year], 54 y.o.   MRN: [id number]  HPI   Melonee Kenner     MRN: [id number]      DOB: 1960/06/11   HPI Ms. Ermel is here for follow up and re-evaluation of chronic medical conditions, medication management and review of any available recent lab and radiology data.  Preventive health is updated, specifically  Cancer screening and Immunization.   Questions or concerns regarding consultations or procedures which the PT has had in the interim are  addressed. The PT denies any adverse reactions to current medications since the last visit.  There are no new concerns.  There are no specific complaints   ROS Denies recent fever or chills. Denies sinus pressure, nasal congestion, ear pain or sore throat. Denies chest congestion, productive cough or wheezing. Denies chest pains, palpitations and leg swelling Denies abdominal pain, nausea, vomiting,diarrhea or constipation.   Denies dysuria, frequency, hesitancy or incontinence. Denies joint pain, swelling and limitation in mobility. Denies headaches, seizures, numbness, or tingling. Denies depression, anxiety or insomnia. Denies skin break down or rash.   PE  BP 128/78 mmHg  Pulse 76  Resp 18  Ht 5\' 2"  (1.575 m)  Wt 117 lb (53.071 kg)  BMI 21.39 kg/m2  SpO2 97%  Patient alert and oriented and in no cardiopulmonary distress.  HEENT: No facial asymmetry, EOMI,   oropharynx pink and moist.  Neck supple no JVD, no mass.  Chest: Clear to auscultation bilaterally.  CVS: S1, S2 no murmurs, no S3.Regular rate.  ABD: Soft non tender.   Ext: No edema  MS: Adequate ROM spine, shoulders, hips and knees.  Skin: Intact, no ulcerations or rash noted.  Psych: Good eye contact, normal affect. Memory intact not anxious or depressed appearing.  CNS: CN 2-12 intact, power,  normal throughout.no focal deficits noted.   Assessment & Plan   Insomnia Controlled,  no change in medication Sleep hygiene reviewed and written information offered also. Prescription sent for  medication needed.   ANXIETY DEPRESSION Controlled, no change in medication   Convulsions Controlled on current regime and managed by neurology  Vitamin D deficiency Continue daily supplement      Review of Systems     Objective:   Physical Exam        Assessment & Plan:

## 2015-01-21 NOTE — Patient Instructions (Addendum)
Annual wellness in 4.5 month, call if you need me sooner  Hep C and chem 7 non fast in 5 month  All the best for 2017!  Thanks for choosing [facility], we consider it a privelige to serve you.

## 2015-02-03 NOTE — Assessment & Plan Note (Signed)
Continue daily supplement 

## 2015-02-03 NOTE — Assessment & Plan Note (Signed)
Controlled, no change in medication Sleep hygiene reviewed and written information offered also. Prescription sent for  medication needed.  

## 2015-02-03 NOTE — Assessment & Plan Note (Signed)
Controlled, no change in medication  

## 2015-02-03 NOTE — Assessment & Plan Note (Signed)
Controlled on current regime and managed by neurology

## 2015-02-26 ENCOUNTER — Other Ambulatory Visit: Payer: Self-pay | Admitting: Family Medicine

## 2015-03-11 ENCOUNTER — Other Ambulatory Visit: Payer: Self-pay | Admitting: Family Medicine

## 2015-03-11 DIAGNOSIS — Z1231 Encounter for screening mammogram for malignant neoplasm of breast: Secondary | ICD-10-CM

## 2015-04-16 ENCOUNTER — Ambulatory Visit (HOSPITAL_COMMUNITY): Payer: Medicare Other

## 2015-04-16 ENCOUNTER — Other Ambulatory Visit: Payer: Self-pay | Admitting: Family Medicine

## 2015-04-16 ENCOUNTER — Ambulatory Visit (HOSPITAL_COMMUNITY)
Admission: RE | Admit: 2015-04-16 | Discharge: 2015-04-16 | Disposition: A | Discharge status: Home / Self Care | Payer: Medicare Other | Source: Ambulatory Visit | Attending: Family Medicine | Admitting: Family Medicine

## 2015-04-16 DIAGNOSIS — Z1231 Encounter for screening mammogram for malignant neoplasm of breast: Secondary | ICD-10-CM

## 2015-05-21 DIAGNOSIS — M62838 Other muscle spasm: Secondary | ICD-10-CM | POA: Diagnosis not present

## 2015-05-21 DIAGNOSIS — Z1159 Encounter for screening for other viral diseases: Secondary | ICD-10-CM | POA: Diagnosis not present

## 2015-05-22 LAB — HEPATITIS C ANTIBODY: HCV Ab: NEGATIVE

## 2015-05-22 LAB — BASIC METABOLIC PANEL
BUN: 7 mg/dL (ref 7–25)
CHLORIDE: 105 mmol/L (ref 98–110)
CO2: 26 mmol/L (ref 20–31)
CREATININE: 0.58 mg/dL (ref 0.50–1.05)
Calcium: 9.4 mg/dL (ref 8.6–10.4)
GLUCOSE: 86 mg/dL (ref 65–99)
Potassium: 4.4 mmol/L (ref 3.5–5.3)
Sodium: 141 mmol/L (ref 135–146)

## 2015-05-29 DIAGNOSIS — F419 Anxiety disorder, unspecified: Secondary | ICD-10-CM | POA: Diagnosis not present

## 2015-05-29 DIAGNOSIS — R569 Unspecified convulsions: Secondary | ICD-10-CM | POA: Diagnosis not present

## 2015-05-29 DIAGNOSIS — E559 Vitamin D deficiency, unspecified: Secondary | ICD-10-CM | POA: Diagnosis not present

## 2015-05-29 DIAGNOSIS — E538 Deficiency of other specified B group vitamins: Secondary | ICD-10-CM | POA: Diagnosis not present

## 2015-06-04 ENCOUNTER — Ambulatory Visit: Payer: Medicare Other | Admitting: Family Medicine

## 2015-06-18 ENCOUNTER — Ambulatory Visit (INDEPENDENT_AMBULATORY_CARE_PROVIDER_SITE_OTHER): Payer: Medicare Other | Admitting: Family Medicine

## 2015-06-18 ENCOUNTER — Encounter: Payer: Self-pay | Admitting: Family Medicine

## 2015-06-18 VITALS — BP 102/74 | HR 82 | Resp 18 | Ht 62.0 in | Wt 131.0 lb

## 2015-06-18 DIAGNOSIS — Z0000 Encounter for general adult medical examination without abnormal findings: Secondary | ICD-10-CM

## 2015-06-18 MED ORDER — MIRTAZAPINE 30 MG PO TABS: 30.0000 mg | ORAL_TABLET | Freq: Every day | ORAL | Status: DC

## 2015-06-18 NOTE — Patient Instructions (Signed)
F/i 5 5 months, call if you need me sooner  Keep up great habits, no med changes  Vision screen today    Fall Prevention in the Home  Falls can cause injuries. They can happen to people of all ages. There are many things you can do to make your home safe and to help prevent falls.  WHAT CAN I DO ON THE OUTSIDE OF MY HOME?  Regularly fix the edges of walkways and driveways and fix any cracks.  Remove anything that might make you trip as you walk through a door, such as a raised step or threshold.  Trim any bushes or trees on the path to your home.  Use bright outdoor lighting.  Clear any walking paths of anything that might make someone trip, such as rocks or tools.  Regularly check to see if handrails are loose or broken. Make sure that both sides of any steps have handrails.  Any raised decks and porches should have guardrails on the edges.  Have any leaves, snow, or ice cleared regularly.  Use sand or salt on walking paths during winter.  Clean up any spills in your garage right away. This includes oil or grease spills. WHAT CAN I DO IN THE BATHROOM?   Use night lights.  Install grab bars by the toilet and in the tub and shower. Do not use towel bars as grab bars.  Use non-skid mats or decals in the tub or shower.  If you need to sit down in the shower, use a plastic, non-slip stool.  Keep the floor dry. Clean up any water that spills on the floor as soon as it happens.  Remove soap buildup in the tub or shower regularly.  Attach bath mats securely with double-sided non-slip rug tape.  Do not have throw rugs and other things on the floor that can make you trip. WHAT CAN I DO IN THE BEDROOM?  Use night lights.  Make sure that you have a light by your bed that is easy to reach.  Do not use any sheets or blankets that are too big for your bed. They should not hang down onto the floor.  Have a firm chair that has side arms. You can use this for support while you  get dressed.  Do not have throw rugs and other things on the floor that can make you trip. WHAT CAN I DO IN THE KITCHEN?  Clean up any spills right away.  Avoid walking on wet floors.  Keep items that you use a lot in easy-to-reach places.  If you need to reach something above you, use a strong step stool that has a grab bar.  Keep electrical cords out of the way.  Do not use floor polish or wax that makes floors slippery. If you must use wax, use non-skid floor wax.  Do not have throw rugs and other things on the floor that can make you trip. WHAT CAN I DO WITH MY STAIRS?  Do not leave any items on the stairs.  Make sure that there are handrails on both sides of the stairs and use them. Fix handrails that are broken or loose. Make sure that handrails are as long as the stairways.  Check any carpeting to make sure that it is firmly attached to the stairs. Fix any carpet that is loose or worn.  Avoid having throw rugs at the top or bottom of the stairs. If you do have throw rugs, attach them to the  floor with carpet tape.  Make sure that you have a light switch at the top of the stairs and the bottom of the stairs. If you do not have them, ask someone to add them for you. WHAT ELSE CAN I DO TO HELP PREVENT FALLS?  Wear shoes that:  Do not have high heels.  Have rubber bottoms.  Are comfortable and fit you well.  Are closed at the toe. Do not wear sandals.  If you use a stepladder:  Make sure that it is fully opened. Do not climb a closed stepladder.  Make sure that both sides of the stepladder are locked into place.  Ask someone to hold it for you, if possible.  Clearly mark and make sure that you can see:  Any grab bars or handrails.  First and last steps.  Where the edge of each step is.  Use tools that help you move around (mobility aids) if they are needed. These include:  Canes.  Walkers.  Scooters.  Crutches.  Turn on the lights when you go into  a dark area. Replace any light bulbs as soon as they burn out.  Set up your furniture so you have a clear path. Avoid moving your furniture around.  If any of your floors are uneven, fix them.  If there are any pets around you, be aware of where they are.  Review your medicines with your doctor. Some medicines can make you feel dizzy. This can increase your chance of falling. Ask your doctor what other things that you can do to help prevent falls.   This information is not intended to replace advice given to you by your health care provider. Make sure you discuss any questions you have with your health care provider.   Document Released: 11/14/2008 Document Revised: 06/04/2014 Document Reviewed: 02/22/2014 Elsevier Interactive Patient Education [employer].

## 2015-06-18 NOTE — Progress Notes (Signed)
Subjective:    Patient ID: Sandra Riley, female    DOB: 30-Jul-1960, 55 y.o.   MRN: [id number]  HPI Preventive Screening-Counseling & Management   Patient present here today for a Medicare annual wellness visit.   Current Problems (verified)   Medications Prior to Visit Allergies (verified)   PAST HISTORY  Family History  Social History Married disabled mother of 3 sons, no illicit drug use   Risk Factors  Current exercise habits:  5 days  Per week   Dietary issues discussed:heart healthy, low sodium ribch in fruit and veg   Cardiac risk factors: none significant  Depression Screen  (Note: if answer to either of the following is "Yes", a more complete depression screening is indicated)   Over the past two weeks, have you felt down, depressed or hopeless? No  Over the past two weeks, have you felt little interest or pleasure in doing things? No  Have you lost interest or pleasure in daily life? No  Do you often feel hopeless? No  Do you cry easily over simple problems? No   Activities of Daily Living  In your present state of health, do you have any difficulty performing the following activities?  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 Managing money?: No Feeding yourself?:No Getting from bed to chair?:No Climbing a flight of stairs?:No Preparing food and eating?:No Bathing or showering?:No Getting dressed?:No Getting to the toilet?:No Using the toilet?:No Moving around from place to place?: No  Fall Risk Assessment In the past year have you fallen or had a near fall?:No Are you currently taking any medications that make you dizzy?:No   Hearing Difficulties: No Do you often ask people to speak up or repeat themselves?:No Do you experience ringing or noises in your ears?:No Do you have difficulty understanding soft or whispered voices?:No  Cognitive Testing  Alert? Yes Normal Appearance?Yes  Oriented to person? Yes Place? Yes  Time? Yes    Displays appropriate judgment?Yes  Can read the correct time from a watch face? yes Are you having problems remembering things?No  Advanced Directives have been discussed with the patient?Yes , full code   List the Names of Other Physician/Practitioners you currently use: Dr Percival Spanish any recent Medical Services you may have received from other than [facility] providers in the past year (date may be approximate).   Assessment:    Annual Wellness Exam   Plan:    Medicare Attestation  I have personally reviewed:  The patient's medical and social history  Their use of alcohol, tobacco or illicit drugs  Their current medications and supplements  The patient's functional ability including ADLs,fall risks, home safety risks, cognitive, and hearing and visual impairment  Diet and physical activities  Evidence for depression or mood disorders  The patient's weight, height, BMI, and visual acuity have been recorded in the chart. I have made referrals, counseling, and provided education to the patient based on review of the above and I have provided the patient with a written personalized care plan for preventive services.      Review of Systems     Objective:   Physical Exam BP 102/74 mmHg  Pulse 82  Resp 18  Ht 5\' 2"  (1.575 m)  Wt 131 lb (59.421 kg)  BMI 23.95 kg/m2  SpO2 99%       Assessment & Plan:  Medicare annual wellness visit, subsequent Annual exam as documented. Counseling done  re healthy lifestyle involving commitment to 150  minutes exercise per week, heart healthy diet, and attaining healthy weight.The importance of adequate sleep also discussed. Regular seat belt use and home safety, is also discussed. lth habits are decided on by the patient with goals and time frames  set for achieving them. Immunization and cancer screening needs are specifically addressed at this visit.

## 2015-06-18 NOTE — Assessment & Plan Note (Signed)
Annual exam as documented. Counseling done  re healthy lifestyle involving commitment to 150 minutes exercise per week, heart healthy diet, and attaining healthy weight.The importance of adequate sleep also discussed. Regular seat belt use and home safety, is also discussed. lth habits are decided on by the patient with goals and time frames  set for achieving them. Immunization and cancer screening needs are specifically addressed at this visit.

## 2015-06-28 ENCOUNTER — Other Ambulatory Visit: Payer: Self-pay | Admitting: Family Medicine

## 2015-11-03 DIAGNOSIS — Z23 Encounter for immunization: Secondary | ICD-10-CM | POA: Diagnosis not present

## 2015-11-18 ENCOUNTER — Ambulatory Visit (INDEPENDENT_AMBULATORY_CARE_PROVIDER_SITE_OTHER): Payer: Medicare Other | Admitting: Family Medicine

## 2015-11-18 ENCOUNTER — Encounter: Payer: Self-pay | Admitting: Family Medicine

## 2015-11-18 VITALS — BP 120/72 | HR 73 | Ht 62.0 in | Wt 143.0 lb

## 2015-11-18 DIAGNOSIS — E559 Vitamin D deficiency, unspecified: Secondary | ICD-10-CM | POA: Diagnosis not present

## 2015-11-18 DIAGNOSIS — R569 Unspecified convulsions: Secondary | ICD-10-CM

## 2015-11-18 DIAGNOSIS — Z1211 Encounter for screening for malignant neoplasm of colon: Secondary | ICD-10-CM | POA: Diagnosis not present

## 2015-11-18 DIAGNOSIS — F341 Dysthymic disorder: Secondary | ICD-10-CM | POA: Diagnosis not present

## 2015-11-18 DIAGNOSIS — G4709 Other insomnia: Secondary | ICD-10-CM

## 2015-11-18 DIAGNOSIS — Z79899 Other long term (current) drug therapy: Secondary | ICD-10-CM | POA: Diagnosis not present

## 2015-11-18 LAB — POC HEMOCCULT BLD/STL (OFFICE/1-CARD/DIAGNOSTIC): Fecal Occult Blood, POC: NEGATIVE

## 2015-11-18 MED ORDER — CYCLOBENZAPRINE HCL 10 MG PO TABS: ORAL_TABLET | ORAL | 3 refills | Status: DC

## 2015-11-18 MED ORDER — MIRTAZAPINE 30 MG PO TABS: 30.0000 mg | ORAL_TABLET | Freq: Every day | ORAL | 1 refills | Status: DC

## 2015-11-18 MED ORDER — MONTELUKAST SODIUM 10 MG PO TABS: 10.0000 mg | ORAL_TABLET | Freq: Every day | ORAL | 3 refills | Status: DC

## 2015-11-18 NOTE — Patient Instructions (Addendum)
F/u in 4.5 month, call if you need me before  Rectal exam today  fasting labs first week in December, CBC, fasting lipid, cmp , TSH and vit D  New for allergies is singulair one daily, reduce tylenol to one daily if neeeded, alos reduce allergy pills to one daily  Thanks for choosing  [facility], we consider it a privelige to serve you.

## 2015-11-23 ENCOUNTER — Encounter: Payer: Self-pay | Admitting: Family Medicine

## 2015-11-23 DIAGNOSIS — Z01419 Encounter for gynecological examination (general) (routine) without abnormal findings: Secondary | ICD-10-CM | POA: Insufficient documentation

## 2015-11-23 NOTE — Assessment & Plan Note (Signed)
Updated lab needed at/ before next visit.   

## 2015-11-23 NOTE — Assessment & Plan Note (Signed)
Heme negative stool no rectal mass

## 2015-11-23 NOTE — Assessment & Plan Note (Signed)
Sleep hygiene reviewed and written information offered also. Prescription sent for  medication needed.  

## 2015-11-23 NOTE — Progress Notes (Signed)
   Sandra Riley     MRN: [id number]      DOB: [birth year]   HPI Sandra Riley is here for follow up and re-evaluation of chronic medical conditions, medication management and review of any available recent lab and radiology data.  Preventive health is updated, specifically  Cancer screening and Immunization.   Questions or concerns regarding consultations or procedures which the PT has had in the interim are  addressed. The PT denies any adverse reactions to current medications since the last visit.  There are no new concerns.  There are no specific complaints   ROS Denies recent fever or chills. Denies sinus pressure, nasal congestion, ear pain or sore throat. Denies chest congestion, productive cough or wheezing. Denies chest pains, palpitations and leg swelling Denies abdominal pain, nausea, vomiting,diarrhea or constipation.   Denies dysuria, frequency, hesitancy or incontinence. Denies joint pain, swelling and limitation in mobility. Denies headaches, seizures, numbness, or tingling. Denies depression, anxiety or insomnia. Denies skin break down or rash.   PE  BP 120/72   Pulse 73   Ht 5\' 2"  (1.575 m)   Wt 143 lb (64.9 kg)   SpO2 97%   BMI 26.16 kg/m   Patient alert and oriented and in no cardiopulmonary distress.  HEENT: No facial asymmetry, EOMI,   oropharynx pink and moist.  Neck supple no JVD, no mass.  Chest: Clear to auscultation bilaterally.  CVS: S1, S2 no murmurs, no S3.Regular rate.  ABD: Soft non tender. No organomegaly or mass Rectal: no mass, heme negative stool  Ext: No edema  MS: Adequate ROM spine, shoulders, hips and knees.  Skin: Intact, no ulcerations or rash noted.  Psych: Good eye contact, normal affect. Memory intact not anxious or depressed appearing.  CNS: CN 2-12 intact, power,  normal throughout.no focal deficits noted.   Assessment & Plan  Convulsions Controlled, no change in medication   ANXIETY DEPRESSION Controlled,  no change in medication   Insomnia Sleep hygiene reviewed and written information offered also. Prescription sent for  medication needed.   Vitamin D deficiency Updated lab needed at/ before next visit.   Special screening for malignant neoplasms, colon Heme negative stool no rectal mass  ALLERGIC RHINITIS, SEASONAL Controlled, no change in medication

## 2015-11-23 NOTE — Assessment & Plan Note (Signed)
Controlled, no change in medication  

## 2015-11-26 DIAGNOSIS — G4089 Other seizures: Secondary | ICD-10-CM | POA: Diagnosis not present

## 2015-11-26 DIAGNOSIS — E559 Vitamin D deficiency, unspecified: Secondary | ICD-10-CM | POA: Diagnosis not present

## 2015-11-26 DIAGNOSIS — F419 Anxiety disorder, unspecified: Secondary | ICD-10-CM | POA: Diagnosis not present

## 2015-11-26 DIAGNOSIS — E538 Deficiency of other specified B group vitamins: Secondary | ICD-10-CM | POA: Diagnosis not present

## 2015-12-10 DIAGNOSIS — Z23 Encounter for immunization: Secondary | ICD-10-CM | POA: Diagnosis not present

## 2016-01-07 DIAGNOSIS — Z79899 Other long term (current) drug therapy: Secondary | ICD-10-CM | POA: Diagnosis not present

## 2016-01-07 DIAGNOSIS — E559 Vitamin D deficiency, unspecified: Secondary | ICD-10-CM | POA: Diagnosis not present

## 2016-01-07 LAB — CBC
HCT: 39.4 % (ref 35.0–45.0)
Hemoglobin: 13.3 g/dL (ref 11.7–15.5)
MCH: 32.9 pg (ref 27.0–33.0)
MCHC: 33.8 g/dL (ref 32.0–36.0)
MCV: 97.5 fL (ref 80.0–100.0)
MPV: 8.7 fL (ref 7.5–12.5)
PLATELETS: 345 10*3/uL (ref 140–400)
RBC: 4.04 MIL/uL (ref 3.80–5.10)
RDW: 12.8 % (ref 11.0–15.0)
WBC: 4.8 10*3/uL (ref 3.8–10.8)

## 2016-01-08 LAB — LIPID PANEL
Cholesterol: 145 mg/dL (ref <200)
HDL: 52 mg/dL (ref >50)
LDL Cholesterol: 76 mg/dL (ref <100)
TRIGLYCERIDES: 84 mg/dL (ref <150)
Total CHOL/HDL Ratio: 2.8 Ratio (ref <5.0)
VLDL: 17 mg/dL (ref <30)

## 2016-01-08 LAB — COMPREHENSIVE METABOLIC PANEL
ALK PHOS: 136 U/L — AB (ref 33–130)
ALT: 13 U/L (ref 6–29)
AST: 19 U/L (ref 10–35)
Albumin: 4.3 g/dL (ref 3.6–5.1)
BUN: 5 mg/dL — ABNORMAL LOW (ref 7–25)
CHLORIDE: 100 mmol/L (ref 98–110)
CO2: 29 mmol/L (ref 20–31)
CREATININE: 0.62 mg/dL (ref 0.50–1.05)
Calcium: 9.7 mg/dL (ref 8.6–10.4)
Glucose, Bld: 87 mg/dL (ref 65–99)
Potassium: 4.1 mmol/L (ref 3.5–5.3)
SODIUM: 137 mmol/L (ref 135–146)
Total Bilirubin: 0.3 mg/dL (ref 0.2–1.2)
Total Protein: 7.5 g/dL (ref 6.1–8.1)

## 2016-01-08 LAB — VITAMIN D 25 HYDROXY (VIT D DEFICIENCY, FRACTURES): VIT D 25 HYDROXY: 55 ng/mL (ref 30–100)

## 2016-03-11 ENCOUNTER — Other Ambulatory Visit: Payer: Self-pay | Admitting: Family Medicine

## 2016-03-11 DIAGNOSIS — Z1231 Encounter for screening mammogram for malignant neoplasm of breast: Secondary | ICD-10-CM

## 2016-03-30 ENCOUNTER — Ambulatory Visit (INDEPENDENT_AMBULATORY_CARE_PROVIDER_SITE_OTHER): Payer: Medicare Other | Admitting: Family Medicine

## 2016-03-30 ENCOUNTER — Encounter: Payer: Self-pay | Admitting: Family Medicine

## 2016-03-30 VITALS — BP 120/74 | HR 82 | Resp 15 | Ht 62.0 in | Wt 130.0 lb

## 2016-03-30 DIAGNOSIS — F5104 Psychophysiologic insomnia: Secondary | ICD-10-CM | POA: Diagnosis not present

## 2016-03-30 DIAGNOSIS — F341 Dysthymic disorder: Secondary | ICD-10-CM

## 2016-03-30 DIAGNOSIS — J301 Allergic rhinitis due to pollen: Secondary | ICD-10-CM

## 2016-03-30 DIAGNOSIS — R569 Unspecified convulsions: Secondary | ICD-10-CM

## 2016-03-30 MED ORDER — MIRTAZAPINE 30 MG PO TABS: 30.0000 mg | ORAL_TABLET | Freq: Every day | ORAL | 5 refills | Status: DC

## 2016-03-30 NOTE — Patient Instructions (Addendum)
Wellness visit early June, call if you need me before'  Try one coated baby aspirin 81 mg once daily to help to reduce stroke risk  Take one calcium with D and one vit D capsule daily till done, then only need calcium with D one twice daily  Use oTC allergy medicine since this works for you  Thank you  for choosing [facility]. We consider it a privelige to serve you.  Delivering excellent health care in a caring and  compassionate way is our goal.  Partnering with you,  so that together we can achieve this goal is our strategy.

## 2016-04-04 ENCOUNTER — Encounter: Payer: Self-pay | Admitting: Family Medicine

## 2016-04-04 NOTE — Assessment & Plan Note (Signed)
Increase in symptoms with season change, reports good response to OTC med will commit to this

## 2016-04-04 NOTE — Progress Notes (Signed)
   Sandra Riley     MRN: [id number]      DOB: 1960-03-29   HPI Sandra Riley is here for follow up and re-evaluation of chronic medical conditions, medication management and review of any available recent lab and radiology data.  Preventive health is updated, specifically  Cancer screening and Immunization.   Questions or concerns regarding consultations or procedures which the PT has had in the interim are  addressed. The PT denies any adverse reactions to current medications since the last visit.  There are no new concerns. Still reports intermittent seizure activity. There are no specific complaints   ROS Denies recent fever or chills. Denies sinus pressure, nasal congestion, ear pain or sore throat. Denies chest congestion, productive cough or wheezing. Denies chest pains, palpitations and leg swelling Denies abdominal pain, nausea, vomiting,diarrhea or constipation.   Denies dysuria, frequency, hesitancy or incontinence. Denies joint pain, swelling and limitation in mobility. Denies headaches, seizures, numbness, or tingling. Denies depression, anxiety or insomnia. Denies skin break down or rash.   PE  BP 120/74   Pulse 82   Resp 15   Ht 5\' 2"  (1.575 m)   Wt 130 lb (59 kg)   SpO2 97%   BMI 23.78 kg/m   Patient alert and oriented and in no cardiopulmonary distress.  HEENT: No facial asymmetry, EOMI,   oropharynx pink and moist.  Neck supple no JVD, no mass.  Chest: Clear to auscultation bilaterally.  CVS: S1, S2 no murmurs, no S3.Regular rate.  ABD: Soft non tender.   Ext: No edema  MS: Adequate ROM spine, shoulders, hips and knees.  Skin: Intact, no ulcerations or rash noted.  Psych: Good eye contact, normal affect. Memory intact not anxious or depressed appearing.  CNS: CN 2-12 intact, power,  normal throughout.no focal deficits noted.   Assessment & Plan  ANXIETY DEPRESSION Controlled, no change in medication   Insomnia Sleep hygiene reviewed  and written information offered also. Prescription sent for  medication needed.   Convulsions Managed by neurology, still reports intermittent seizure activity will have neuro address this, medication has not been changed in years , and description of seizure is that it always is in her sleep  ALLERGIC RHINITIS, SEASONAL Increase in symptoms with season change, reports good response to OTC med will commit to this

## 2016-04-04 NOTE — Assessment & Plan Note (Signed)
Managed by neurology, still reports intermittent seizure activity will have neuro address this, medication has not been changed in years , and description of seizure is that it always is in her sleep

## 2016-04-04 NOTE — Assessment & Plan Note (Signed)
Controlled, no change in medication  

## 2016-04-04 NOTE — Assessment & Plan Note (Signed)
Sleep hygiene reviewed and written information offered also. Prescription sent for  medication needed.  

## 2016-04-16 ENCOUNTER — Ambulatory Visit (HOSPITAL_COMMUNITY): Payer: Medicare Other

## 2016-04-19 ENCOUNTER — Ambulatory Visit (HOSPITAL_COMMUNITY)
Admission: RE | Admit: 2016-04-19 | Discharge: 2016-04-19 | Disposition: A | Discharge status: Home / Self Care | Payer: Medicare Other | Source: Ambulatory Visit | Attending: Family Medicine | Admitting: Family Medicine

## 2016-04-19 DIAGNOSIS — Z1231 Encounter for screening mammogram for malignant neoplasm of breast: Secondary | ICD-10-CM | POA: Insufficient documentation

## 2016-05-24 DIAGNOSIS — F419 Anxiety disorder, unspecified: Secondary | ICD-10-CM | POA: Diagnosis not present

## 2016-05-24 DIAGNOSIS — E559 Vitamin D deficiency, unspecified: Secondary | ICD-10-CM | POA: Diagnosis not present

## 2016-05-24 DIAGNOSIS — E538 Deficiency of other specified B group vitamins: Secondary | ICD-10-CM | POA: Diagnosis not present

## 2016-05-24 DIAGNOSIS — R569 Unspecified convulsions: Secondary | ICD-10-CM | POA: Diagnosis not present

## 2016-06-30 ENCOUNTER — Ambulatory Visit: Payer: Medicare Other

## 2016-06-30 ENCOUNTER — Telehealth: Payer: Self-pay

## 2016-07-07 NOTE — Telephone Encounter (Signed)
Unable to reach patient to reschedule.

## 2016-08-21 ENCOUNTER — Other Ambulatory Visit: Payer: Self-pay | Admitting: Family Medicine

## 2016-08-23 DIAGNOSIS — F419 Anxiety disorder, unspecified: Secondary | ICD-10-CM | POA: Diagnosis not present

## 2016-08-23 DIAGNOSIS — R569 Unspecified convulsions: Secondary | ICD-10-CM | POA: Diagnosis not present

## 2016-08-23 DIAGNOSIS — E538 Deficiency of other specified B group vitamins: Secondary | ICD-10-CM | POA: Diagnosis not present

## 2016-08-23 DIAGNOSIS — E559 Vitamin D deficiency, unspecified: Secondary | ICD-10-CM | POA: Diagnosis not present

## 2016-08-23 NOTE — Telephone Encounter (Signed)
Seen 2 27 18 

## 2016-09-20 ENCOUNTER — Ambulatory Visit (INDEPENDENT_AMBULATORY_CARE_PROVIDER_SITE_OTHER): Payer: Medicare Other

## 2016-09-20 VITALS — BP 118/70 | HR 64 | Temp 98.7°F | Ht 62.0 in | Wt 128.0 lb

## 2016-09-20 DIAGNOSIS — Z135 Encounter for screening for eye and ear disorders: Secondary | ICD-10-CM | POA: Diagnosis not present

## 2016-09-20 DIAGNOSIS — Z0000 Encounter for general adult medical examination without abnormal findings: Secondary | ICD-10-CM | POA: Diagnosis not present

## 2016-09-20 NOTE — Progress Notes (Signed)
Subjective:   Sandra Riley is a 56 y.o. female who presents for Medicare Annual (Subsequent) preventive examination.  Review of Systems:  Cardiac Risk Factors include: none     Objective:     Vitals: BP 118/70   Pulse 64   Temp 98.7 F (37.1 C) (Oral)   Ht 5\' 2"  (1.575 m)   Wt 128 lb 0.6 oz (58.1 kg)   BMI 23.42 kg/m   Body mass index is 23.42 kg/m.   Tobacco History  Smoking Status  . Never Smoker  Smokeless Tobacco  . Never Used     Counseling given: Not Answered   Past Medical History:  Diagnosis Date  . Anxiety and depression   . GAD (generalized anxiety disorder)   . Insomnia   . Psychotic disorder   . Seizure disorder ([place])   . 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 ABDOMINAL HYSTERECTOMY    . CESAREAN SECTION    . CHOLECYSTECTOMY     Family History  Problem Relation Age of Onset  . Breast cancer Mother 64  . Coronary artery disease Father   . Stroke Father 41  . Breast cancer Sister 80  . Breast cancer Sister   . Breast cancer Sister   . Diabetes Brother   . Heart attack Brother    History  Sexual Activity  . Sexual activity: Yes  . Birth control/ protection: Surgical    Outpatient Encounter Prescriptions as of 09/20/2016  Medication Sig  . acetaminophen (TYLENOL) 500 MG tablet Take 500 mg by mouth. Take one tablet by mouth two times a day as needed for severe headache, max 6 tabs per week   . cholecalciferol (VITAMIN D) 1000 UNITS tablet Take 1,000 Units by mouth daily.  . clonazePAM (KLONOPIN) 0.5 MG tablet Take 0.5 mg by mouth 3 (three) times daily.  . cyclobenzaprine (FLEXERIL) 10 MG tablet TAKE ONE TABLET BY MOUTH AT BEDTIME AS NEEDED FOR MUSCLE SPASMS. SHOULD LAST TEN WEEKS.  . LevETIRAcetam 500 MG TB3D Two tablets twice daily  . mirtazapine (REMERON) 30 MG tablet Take 1 tablet (30 mg total) by mouth at bedtime.  . Multiple Vitamin (MULTIVITAMIN) tablet Take 1 tablet by mouth  daily.  Marland Kitchen PHENobarbital (LUMINAL) 97.2 MG tablet Take 97.2 mg by mouth 2 (two) times daily.   No facility-administered encounter medications on file as of 09/20/2016.     Activities of Daily Living In your present state of health, do you have any difficulty performing the following activities: 09/20/2016  Hearing? N  Vision? N  Difficulty concentrating or making decisions? N  Walking or climbing stairs? N  Dressing or bathing? N  Doing errands, shopping? Y  Comment no longer drives  Preparing Food and eating ? N  Using the Toilet? N  In the past six months, have you accidently leaked urine? N  Do you have problems with loss of bowel control? N  Managing your Medications? N  Managing your Finances? N  Housekeeping or managing your Housekeeping? N  Some recent data might be hidden    Patient Care Team: Fayrene Helper, MD as PCP - General Phillips Odor, MD as Consulting Physician (Neurology)    Assessment:    Exercise Activities and Dietary recommendations Current Exercise Habits: Home exercise routine, Type of exercise: walking, Time (Minutes): 10, Frequency (Times/Week): 3, Weekly Exercise (Minutes/Week): 30, Intensity: Mild, Exercise limited by: None identified  Goals    . Exercise  3x per week (30 min per time)          Recommend increasing your exercise routine at least 3 days a week for 30-45 minutes at a time as tolerated.        Fall Risk Fall Risk  09/20/2016 06/18/2015 08/21/2014 03/12/2014 02/05/2013  Falls in the past year? No No No Yes No  Number falls in past yr: - - - 1 -  Injury with Fall? - - - No -   Depression Screen PHQ 2/9 Scores 09/20/2016 06/18/2015 08/21/2014 04/26/2013  PHQ - 2 Score 0 0 0 1  PHQ- 9 Score - - - -     Cognitive Function: Normal   6CIT Screen 09/20/2016  What Year? 0 points  What month? 0 points  What time? 0 points  Count back from 20 0 points  Months in reverse 0 points  Repeat phrase 0 points  Total Score 0     Immunization History  Administered Date(s) Administered  . H1N1 12/05/2007  . Influenza Split 12/15/2010, 11/23/2011, 12/02/2014  . Influenza Whole 12/20/2006, 12/10/2009  . Influenza,inj,Quad PF,36+ Mos 12/04/2012  . Pneumococcal Conjugate-13 03/12/2014  . Pneumococcal Polysaccharide-23 03/03/2009  . Td 02/01/2001  . Tdap 09/14/2010   Screening Tests Health Maintenance  Topic Date Due  . PAP SMEAR  06/19/2016  . INFLUENZA VACCINE  09/01/2016  . MAMMOGRAM  04/20/2018  . COLONOSCOPY  11/08/2018  . TETANUS/TDAP  09/13/2020  . Hepatitis C Screening  Completed  . HIV Screening  Completed  Patient prefers to have her Flu vaccine at her pharmacy and will also discuss the shingles vaccine.     Plan:   I have personally reviewed and noted the following in the patient's chart:   . Medical and social history . Use of alcohol, tobacco or illicit drugs  . Current medications and supplements . Functional ability and status . Nutritional status . Physical activity . Advanced directives . List of other physicians . Hospitalizations, surgeries, and ER visits in previous 12 months . Vitals . Screenings to include cognitive, depression, and falls . Referrals and appointments: Referral sent to Dr. Derrel Nip today for screening for Glaucoma.  In addition, I have reviewed and discussed with patient certain preventive protocols, quality metrics, and best practice recommendations. A written personalized care plan for preventive services as well as general preventive health recommendations were provided to patient.     Stormy Fabian, LPN  [id number]

## 2016-09-20 NOTE — Patient Instructions (Addendum)
Sandra Riley , Thank you for taking time to come for your Medicare Wellness Visit. I appreciate your ongoing commitment to your health goals. Please review the following plan we discussed and let me know if I can assist you in the future.   Screening recommendations/referrals: Colonoscopy: Up to date, next due 11/2018 Mammogram: Up to date, next due 04/2017 Bone Density: Up to date Recommended yearly ophthalmology/optometry visit for glaucoma screening and checkup: Referral to Dr. Derrel Nip in [place] was sent today. Recommended yearly dental visit for hygiene and checkup  Vaccinations: Influenza vaccine: Due Pneumococcal vaccine: Up to date, next due at age 65 Tdap vaccine: Up to date, next due 09/2020 Shingles vaccine: Due, declines today    Advanced directives: Advance directive discussed with you today. I have provided a copy for you to complete at home and have notarized. Once this is complete please bring a copy in to our office so we can scan it into your chart.  Next appointment: Follow up in 1 month with Dr. Moshe Cipro. Follow up in 1 year for your annual wellness visit.  Preventive Care 40-64 Years, Female Preventive care refers to lifestyle choices and visits with your health care provider that can promote health and wellness. What does preventive care include?  A yearly physical exam. This is also called an annual well check.  Dental exams once or twice a year.  Routine eye exams. Ask your health care provider how often you should have your eyes checked.  Personal lifestyle choices, including:  Daily care of your teeth and gums.  Regular physical activity.  Eating a healthy diet.  Avoiding tobacco and drug use.  Limiting alcohol use.  Practicing safe sex.  Taking low-dose aspirin daily starting at age 49.  Taking vitamin and mineral supplements as recommended by your health care provider. What happens during an annual well check? The services and screenings done by  your health care provider during your annual well check will depend on your age, overall health, lifestyle risk factors, and family history of disease. Counseling  Your health care provider may ask you questions about your:  Alcohol use.  Tobacco use.  Drug use.  Emotional well-being.  Home and relationship well-being.  Sexual activity.  Eating habits.  Work and work Statistician.  Method of birth control.  Menstrual cycle.  Pregnancy history. Screening  You may have the following tests or measurements:  Height, weight, and BMI.  Blood pressure.  Lipid and cholesterol levels. These may be checked every 5 years, or more frequently if you are over 42 years old.  Skin check.  Lung cancer screening. You may have this screening every year starting at age 35 if you have a 30-pack-year history of smoking and currently smoke or have quit within the past 15 years.  Fecal occult blood test (FOBT) of the stool. You may have this test every year starting at age 105.  Flexible sigmoidoscopy or colonoscopy. You may have a sigmoidoscopy every 5 years or a colonoscopy every 10 years starting at age 27.  Hepatitis C blood test.  Hepatitis B blood test.  Sexually transmitted disease (STD) testing.  Diabetes screening. This is done by checking your blood sugar (glucose) after you have not eaten for a while (fasting). You may have this done every 1-3 years.  Mammogram. This may be done every 1-2 years. Talk to your health care provider about when you should start having regular mammograms. This may depend on whether you have a family history of  breast cancer.  BRCA-related cancer screening. This may be done if you have a family history of breast, ovarian, tubal, or peritoneal cancers.  Pelvic exam and Pap test. This may be done every 3 years starting at age 32. Starting at age 47, this may be done every 5 years if you have a Pap test in combination with an HPV test.  Bone density  scan. This is done to screen for osteoporosis. You may have this scan if you are at high risk for osteoporosis. Discuss your test results, treatment options, and if necessary, the need for more tests with your health care provider. Vaccines  Your health care provider may recommend certain vaccines, such as:  Influenza vaccine. This is recommended every year.  Tetanus, diphtheria, and acellular pertussis (Tdap, Td) vaccine. You may need a Td booster every 10 years.  Zoster vaccine. You may need this after age 80.  Pneumococcal 13-valent conjugate (PCV13) vaccine. You may need this if you have certain conditions and were not previously vaccinated.  Pneumococcal polysaccharide (PPSV23) vaccine. You may need one or two doses if you smoke cigarettes or if you have certain conditions. Talk to your health care provider about which screenings and vaccines you need and how often you need them. This information is not intended to replace advice given to you by your health care provider. Make sure you discuss any questions you have with your health care provider. Document Released: 02/14/2015 Document Revised: 10/08/2015 Document Reviewed: 11/19/2014 Elsevier Interactive Patient Education  2017 [employer] Prevention in the Home Falls can cause injuries. They can happen to people of all ages. There are many things you can do to make your home safe and to help prevent falls. What can I do on the outside of my home?  Regularly fix the edges of walkways and driveways and fix any cracks.  Remove anything that might make you trip as you walk through a door, such as a raised step or threshold.  Trim any bushes or trees on the path to your home.  Use bright outdoor lighting.  Clear any walking paths of anything that might make someone trip, such as rocks or tools.  Regularly check to see if handrails are loose or broken. Make sure that both sides of any steps have handrails.  Any raised  decks and porches should have guardrails on the edges.  Have any leaves, snow, or ice cleared regularly.  Use sand or salt on walking paths during winter.  Clean up any spills in your garage right away. This includes oil or grease spills. What can I do in the bathroom?  Use night lights.  Install grab bars by the toilet and in the tub and shower. Do not use towel bars as grab bars.  Use non-skid mats or decals in the tub or shower.  If you need to sit down in the shower, use a plastic, non-slip stool.  Keep the floor dry. Clean up any water that spills on the floor as soon as it happens.  Remove soap buildup in the tub or shower regularly.  Attach bath mats securely with double-sided non-slip rug tape.  Do not have throw rugs and other things on the floor that can make you trip. What can I do in the bedroom?  Use night lights.  Make sure that you have a light by your bed that is easy to reach.  Do not use any sheets or blankets that are too big for  your bed. They should not hang down onto the floor.  Have a firm chair that has side arms. You can use this for support while you get dressed.  Do not have throw rugs and other things on the floor that can make you trip. What can I do in the kitchen?  Clean up any spills right away.  Avoid walking on wet floors.  Keep items that you use a lot in easy-to-reach places.  If you need to reach something above you, use a strong step stool that has a grab bar.  Keep electrical cords out of the way.  Do not use floor polish or wax that makes floors slippery. If you must use wax, use non-skid floor wax.  Do not have throw rugs and other things on the floor that can make you trip. What can I do with my stairs?  Do not leave any items on the stairs.  Make sure that there are handrails on both sides of the stairs and use them. Fix handrails that are broken or loose. Make sure that handrails are as long as the stairways.  Check  any carpeting to make sure that it is firmly attached to the stairs. Fix any carpet that is loose or worn.  Avoid having throw rugs at the top or bottom of the stairs. If you do have throw rugs, attach them to the floor with carpet tape.  Make sure that you have a light switch at the top of the stairs and the bottom of the stairs. If you do not have them, ask someone to add them for you. What else can I do to help prevent falls?  Wear shoes that:  Do not have high heels.  Have rubber bottoms.  Are comfortable and fit you well.  Are closed at the toe. Do not wear sandals.  If you use a stepladder:  Make sure that it is fully opened. Do not climb a closed stepladder.  Make sure that both sides of the stepladder are locked into place.  Ask someone to hold it for you, if possible.  Clearly mark and make sure that you can see:  Any grab bars or handrails.  First and last steps.  Where the edge of each step is.  Use tools that help you move around (mobility aids) if they are needed. These include:  Canes.  Walkers.  Scooters.  Crutches.  Turn on the lights when you go into a dark area. Replace any light bulbs as soon as they burn out.  Set up your furniture so you have a clear path. Avoid moving your furniture around.  If any of your floors are uneven, fix them.  If there are any pets around you, be aware of where they are.  Review your medicines with your doctor. Some medicines can make you feel dizzy. This can increase your chance of falling. Ask your doctor what other things that you can do to help prevent falls. This information is not intended to replace advice given to you by your health care provider. Make sure you discuss any questions you have with your health care provider. Document Released: 11/14/2008 Document Revised: 06/26/2015 Document Reviewed: 02/22/2014 Elsevier Interactive Patient Education  2017 [employer].

## 2016-10-20 ENCOUNTER — Other Ambulatory Visit: Payer: Self-pay | Admitting: Family Medicine

## 2016-10-20 MED ORDER — MIRTAZAPINE 30 MG PO TABS: 30.0000 mg | ORAL_TABLET | Freq: Every day | ORAL | 1 refills | Status: DC

## 2016-10-20 NOTE — Telephone Encounter (Signed)
Seen 2 27 18

## 2016-11-04 ENCOUNTER — Ambulatory Visit: Payer: Medicare Other | Admitting: Family Medicine

## 2016-11-09 ENCOUNTER — Ambulatory Visit (INDEPENDENT_AMBULATORY_CARE_PROVIDER_SITE_OTHER): Payer: Medicare Other | Admitting: Family Medicine

## 2016-11-09 ENCOUNTER — Encounter: Payer: Self-pay | Admitting: Family Medicine

## 2016-11-09 VITALS — BP 120/60 | HR 62 | Temp 97.4°F | Resp 16 | Ht 62.0 in | Wt 124.5 lb

## 2016-11-09 DIAGNOSIS — J40 Bronchitis, not specified as acute or chronic: Secondary | ICD-10-CM | POA: Diagnosis not present

## 2016-11-09 DIAGNOSIS — E039 Hypothyroidism, unspecified: Secondary | ICD-10-CM

## 2016-11-09 DIAGNOSIS — E559 Vitamin D deficiency, unspecified: Secondary | ICD-10-CM | POA: Diagnosis not present

## 2016-11-09 DIAGNOSIS — Z1329 Encounter for screening for other suspected endocrine disorder: Secondary | ICD-10-CM | POA: Diagnosis not present

## 2016-11-09 DIAGNOSIS — F5101 Primary insomnia: Secondary | ICD-10-CM | POA: Diagnosis not present

## 2016-11-09 MED ORDER — MIRTAZAPINE 30 MG PO TABS: 30.0000 mg | ORAL_TABLET | Freq: Every day | ORAL | 1 refills | Status: DC

## 2016-11-09 MED ORDER — AZITHROMYCIN 250 MG PO TABS: ORAL_TABLET | ORAL | 0 refills | Status: DC

## 2016-11-09 NOTE — Patient Instructions (Addendum)
Physical exam early January with pap  CBC, chem 7  tsh, vit D, Magnesium today  5 day antibiotic course sent in because of your recent cough , chills and fever  Return in 2 weeks for flu vaccine please   Thank you  for choosing [facility]. We consider it a privelige to serve you.  Delivering excellent health care in a caring and  compassionate way is our goal.  Partnering with you,  so that together we can achieve this goal is our strategy.

## 2016-11-10 LAB — CBC
HEMATOCRIT: 41.8 % (ref 35.0–45.0)
HEMOGLOBIN: 14.3 g/dL (ref 11.7–15.5)
MCH: 32 pg (ref 27.0–33.0)
MCHC: 34.2 g/dL (ref 32.0–36.0)
MCV: 93.5 fL (ref 80.0–100.0)
MPV: 8.8 fL (ref 7.5–12.5)
Platelets: 459 10*3/uL — ABNORMAL HIGH (ref 140–400)
RBC: 4.47 10*6/uL (ref 3.80–5.10)
RDW: 11.8 % (ref 11.0–15.0)
WBC: 6 10*3/uL (ref 3.8–10.8)

## 2016-11-10 LAB — COMPLETE METABOLIC PANEL WITH GFR
AG Ratio: 1.4 (calc) (ref 1.0–2.5)
ALKALINE PHOSPHATASE (APISO): 170 U/L — AB (ref 33–130)
ALT: 13 U/L (ref 6–29)
AST: 21 U/L (ref 10–35)
Albumin: 4.6 g/dL (ref 3.6–5.1)
BILIRUBIN TOTAL: 0.2 mg/dL (ref 0.2–1.2)
BUN: 7 mg/dL (ref 7–25)
CHLORIDE: 96 mmol/L — AB (ref 98–110)
CO2: 29 mmol/L (ref 20–32)
CREATININE: 0.6 mg/dL (ref 0.50–1.05)
Calcium: 10.2 mg/dL (ref 8.6–10.4)
GFR, EST AFRICAN AMERICAN: 116 mL/min/{1.73_m2} (ref >=60)
GFR, Est Non African American: 100 mL/min/{1.73_m2} (ref >=60)
GLUCOSE: 97 mg/dL (ref 65–99)
Globulin: 3.3 g/dL (calc) (ref 1.9–3.7)
Potassium: 4.1 mmol/L (ref 3.5–5.3)
Sodium: 135 mmol/L (ref 135–146)
TOTAL PROTEIN: 7.9 g/dL (ref 6.1–8.1)

## 2016-11-10 LAB — TSH: TSH: 0.62 mIU/L (ref 0.40–4.50)

## 2016-11-10 LAB — VITAMIN D 25 HYDROXY (VIT D DEFICIENCY, FRACTURES): VIT D 25 HYDROXY: 64 ng/mL (ref 30–100)

## 2016-11-10 LAB — MAGNESIUM: Magnesium: 2 mg/dL (ref 1.5–2.5)

## 2016-11-13 DIAGNOSIS — J40 Bronchitis, not specified as acute or chronic: Secondary | ICD-10-CM | POA: Insufficient documentation

## 2016-11-13 NOTE — Assessment & Plan Note (Signed)
Acute symptom onset, Z pack prescribed

## 2016-11-13 NOTE — Assessment & Plan Note (Signed)
Sleep hygiene reviewed and written information offered also. Prescription sent for  medication needed.  

## 2016-11-13 NOTE — Assessment & Plan Note (Signed)
corrected

## 2016-11-13 NOTE — Progress Notes (Signed)
   Sandra Riley     MRN: [id number]      DOB: [birth year]   HPI Sandra Riley is here for follow up and re-evaluation of chronic medical conditions, medication management and review of any available recent lab and radiology data.  Preventive health is updated, specifically  Cancer screening and Immunization.  Flu vaccine deferred as she is  Ill at this visit Questions or concerns regarding consultations or procedures which the PT has had in the interim are  addressed. The PT denies any adverse reactions to current medications since the last visit.  5 day h/o fever , chills , headache and cough productive of yellow sputum   ROS . Denies sinus pressure, nasal congestion, ear pain or sore throat.  Denies chest pains, palpitations and leg swelling Denies abdominal pain, nausea, vomiting,diarrhea or constipation.   Denies dysuria, frequency, hesitancy or incontinence. Denies joint pain, swelling and limitation in mobility. Denies headaches, seizures, numbness, or tingling. Denies depression, anxiety or insomnia. Denies skin break down or rash.   PE  BP 120/60 (BP Location: Left Arm, Patient Position: Sitting, Cuff Size: Normal)   Pulse 62   Temp (!) 97.4 F (36.3 C) (Other (Comment))   Resp 16   Ht 5\' 2"  (1.575 m)   Wt 124 lb 8 oz (56.5 kg)   SpO2 98%   BMI 22.77 kg/m   Patient alert and oriented and in no cardiopulmonary distress.  HEENT: No facial asymmetry, EOMI,   oropharynx pink and moist.  Neck supple no JVD, no mass.  Chest: adequate air entry , scattered crackles, no wheezes  CVS: S1, S2 no murmurs, no S3.Regular rate.  ABD: Soft non tender.   Ext: No edema  MS: Adequate ROM spine, shoulders, hips and knees.  Skin: Intact, no ulcerations or rash noted.  Psych: Good eye contact, normal affect. Memory intact not anxious or depressed appearing.  CNS: CN 2-12 intact, power,  normal throughout.no focal deficits noted.   Assessment & Plan  Bronchitis Acute  symptom onset, Z pack prescribed  Convulsions Controlled, no change in medication   Insomnia Sleep hygiene reviewed and written information offered also. Prescription sent for  medication needed.   Vitamin D deficiency corrected

## 2016-11-13 NOTE — Assessment & Plan Note (Signed)
Controlled, no change in medication  

## 2016-11-26 DIAGNOSIS — Z23 Encounter for immunization: Secondary | ICD-10-CM | POA: Diagnosis not present

## 2016-12-29 DIAGNOSIS — H25013 Cortical age-related cataract, bilateral: Secondary | ICD-10-CM | POA: Diagnosis not present

## 2016-12-29 DIAGNOSIS — H268 Other specified cataract: Secondary | ICD-10-CM | POA: Diagnosis not present

## 2016-12-29 DIAGNOSIS — H2513 Age-related nuclear cataract, bilateral: Secondary | ICD-10-CM | POA: Diagnosis not present

## 2017-02-10 ENCOUNTER — Telehealth: Payer: Self-pay | Admitting: Family Medicine

## 2017-02-10 ENCOUNTER — Encounter: Payer: Medicare Other | Admitting: Family Medicine

## 2017-02-10 NOTE — Telephone Encounter (Signed)
CALLED PATIENT TO R/S CPE PER R/S LIST FOR 02/24/17

## 2017-02-24 ENCOUNTER — Encounter: Payer: Medicare Other | Admitting: Family Medicine

## 2017-03-07 DIAGNOSIS — G4709 Other insomnia: Secondary | ICD-10-CM | POA: Diagnosis not present

## 2017-03-07 DIAGNOSIS — G40409 Other generalized epilepsy and epileptic syndromes, not intractable, without status epilepticus: Secondary | ICD-10-CM | POA: Diagnosis not present

## 2017-03-07 DIAGNOSIS — Z79899 Other long term (current) drug therapy: Secondary | ICD-10-CM | POA: Diagnosis not present

## 2017-03-07 DIAGNOSIS — G40219 Localization-related (focal) (partial) symptomatic epilepsy and epileptic syndromes with complex partial seizures, intractable, without status epilepticus: Secondary | ICD-10-CM | POA: Diagnosis not present

## 2017-03-08 ENCOUNTER — Encounter: Payer: Medicare Other | Admitting: Family Medicine

## 2017-03-10 ENCOUNTER — Other Ambulatory Visit: Payer: Self-pay | Admitting: Family Medicine

## 2017-03-10 DIAGNOSIS — Z1231 Encounter for screening mammogram for malignant neoplasm of breast: Secondary | ICD-10-CM

## 2017-03-25 ENCOUNTER — Other Ambulatory Visit: Payer: Self-pay

## 2017-03-25 ENCOUNTER — Encounter: Payer: Self-pay | Admitting: Family Medicine

## 2017-03-25 ENCOUNTER — Ambulatory Visit (INDEPENDENT_AMBULATORY_CARE_PROVIDER_SITE_OTHER): Payer: Medicare Other | Admitting: Family Medicine

## 2017-03-25 VITALS — BP 128/70 | HR 84 | Temp 99.1°F | Resp 18 | Ht 62.0 in | Wt 137.0 lb

## 2017-03-25 DIAGNOSIS — R6889 Other general symptoms and signs: Secondary | ICD-10-CM

## 2017-03-25 LAB — POCT INFLUENZA A/B
INFLUENZA B, POC: NEGATIVE
Influenza A, POC: NEGATIVE

## 2017-03-25 MED ORDER — OSELTAMIVIR PHOSPHATE 75 MG PO CAPS: 75.0000 mg | ORAL_CAPSULE | Freq: Two times a day (BID) | ORAL | 0 refills | Status: DC

## 2017-03-25 MED ORDER — ONDANSETRON HCL 4 MG PO TABS: 4.0000 mg | ORAL_TABLET | Freq: Three times a day (TID) | ORAL | 0 refills | Status: DC | PRN

## 2017-03-25 NOTE — Patient Instructions (Addendum)
Rest Drink lots of fluids Make sure you do not over exert yourself Take Tamiflu for 5 days Take Tamiflu with food Take the nausea pill if you need to Take Tylenol or ibuprofen for the aches and fever May use Robitussin-DM or over-the-counter medicine for cough   Influenza, Adult Influenza ("the flu") is an infection in the lungs, nose, and throat (respiratory tract). It is caused by a virus. The flu causes many common cold symptoms, as well as a high fever and body aches. It can make you feel very sick. The flu spreads easily from person to person (is contagious). Getting a flu shot (influenza vaccination) every year is the best way to prevent the flu. Follow these instructions at home:  Take over-the-counter and prescription medicines only as told by your doctor.  Use a cool mist humidifier to add moisture (humidity) to the air in your home. This can make it easier to breathe.  Rest as needed.  Drink enough fluid to keep your pee (urine) clear or pale yellow.  Cover your mouth and nose when you cough or sneeze.  Wash your hands with soap and water often, especially after you cough or sneeze. If you cannot use soap and water, use hand sanitizer.  Stay home from work or school as told by your doctor. Unless you are visiting your doctor, try to avoid leaving home until your fever has been gone for 24 hours without the use of medicine.  Keep all follow-up visits as told by your doctor. This is important. How is this prevented?  Getting a yearly (annual) flu shot is the best way to avoid getting the flu. You may get the flu shot in late summer, fall, or winter. Ask your doctor when you should get your flu shot.  Wash your hands often or use hand sanitizer often.  Avoid contact with people who are sick during cold and flu season.  Eat healthy foods.  Drink plenty of fluids.  Get enough sleep.  Exercise regularly. Contact a doctor if:  You get new symptoms.  You  have: ? Chest pain. ? Watery poop (diarrhea). ? A fever.  Your cough gets worse.  You start to have more mucus.  You feel sick to your stomach (nauseous).  You throw up (vomit). Get help right away if:  You start to be short of breath or have trouble breathing.  Your skin or nails turn a bluish color.  You have very bad pain or stiffness in your neck.  You get a sudden headache.  You get sudden pain in your face or ear.  You cannot stop throwing up. This information is not intended to replace advice given to you by your health care provider. Make sure you discuss any questions you have with your health care provider. Document Released: 10/28/2007 Document Revised: 06/26/2015 Document Reviewed: 11/12/2014 Elsevier Interactive Patient Education  2017 [employer].

## 2017-03-25 NOTE — Progress Notes (Signed)
Chief Complaint  Patient presents with  . URI   Patient is here for an upper respiratory infection.  It started on Tuesday.  She had the sudden onset of runny and stuffy nose, cough, sweats and chills, extreme weakness and tiredness.  She spent the last 2 days in bed.  She is brought in today by her husband.  No known exposure to influenza.  She did have a flu shot.  No sore throat.  She does have a headache.  She has had some nausea and one episode of vomiting.  No diarrhea.  No abdominal pain.  No underlying COPD or lung disease.  She does have some allergies.  No seizures, well-controlled on her medicines.   Patient Active Problem List   Diagnosis Date Noted  . Bronchitis 11/13/2016  . Special screening for malignant neoplasms, colon 11/23/2015  . Abnormal facial hair 05/11/2011  . Vitamin D deficiency 12/20/2010  . ALLERGIC RHINITIS, SEASONAL 09/04/2007  . ANXIETY DEPRESSION 02/21/2007  . Convulsions ([place]) 02/21/2007  . Insomnia 02/21/2007    Outpatient Encounter Medications as of 03/25/2017  Medication Sig  . acetaminophen (TYLENOL) 500 MG tablet Take 500 mg by mouth. Take one tablet by mouth two times a day as needed for severe headache, max 6 tabs per week   . aspirin EC 81 MG tablet Take 81 mg by mouth daily.  Marland Kitchen azithromycin (ZITHROMAX) 250 MG tablet Two tablets today, then one tablet once daily for an additional 4 days  . clonazePAM (KLONOPIN) 0.5 MG tablet Take 0.5 mg by mouth 3 (three) times daily.  . cyclobenzaprine (FLEXERIL) 10 MG tablet TAKE ONE TABLET BY MOUTH AT BEDTIME AS NEEDED FOR MUSCLE SPASMS. SHOULD LAST TEN WEEKS.  . LevETIRAcetam 500 MG TB3D Two tablets twice daily  . mirtazapine (REMERON) 30 MG tablet Take 1 tablet (30 mg total) by mouth at bedtime.  . mirtazapine (REMERON) 30 MG tablet Take 1 tablet (30 mg total) by mouth at bedtime.  . Multiple Vitamin (MULTIVITAMIN) tablet Take 1 tablet by mouth daily.  Marland Kitchen PHENobarbital (LUMINAL) 97.2 MG tablet Take 97.2  mg by mouth 2 (two) times daily.  . vitamin C (ASCORBIC ACID) 500 MG tablet Take 500 mg by mouth daily.  . ondansetron (ZOFRAN) 4 MG tablet Take 1 tablet (4 mg total) by mouth every 8 (eight) hours as needed for nausea or vomiting.  Marland Kitchen oseltamivir (TAMIFLU) 75 MG capsule Take 1 capsule (75 mg total) by mouth 2 (two) times daily.   No facility-administered encounter medications on file as of 03/25/2017.     Allergies  Allergen Reactions  . Aspirin   . Singulair [Montelukast Sodium] Other (See Comments)    Became sweaty and nuaseated    Review of Systems    BP 128/70 (BP Location: Left Arm, Patient Position: Sitting, Cuff Size: Normal)   Pulse 84   Temp 99.1 F (37.3 C) (Oral)   Resp 18   Ht 5\' 2"  (1.575 m)   Wt 137 lb 0.6 oz (62.2 kg)   SpO2 99%   BMI 25.06 kg/m   Physical Exam  Constitutional: She is oriented to person, place, and time. She appears well-developed and well-nourished. She appears distressed.  Moderately ill, very tired, needs assistance getting on the table.  HENT:  Head: Normocephalic and atraumatic.  Right Ear: External ear normal.  Left Ear: External ear normal.  Posterior pharynx is injected.  No exudate.  No adenopathy.  No sinus tenderness.  Nasal membranes are swollen  and red  Eyes: Conjunctivae are normal. Pupils are equal, round, and reactive to light.  Neck: Normal range of motion.  Cardiovascular: Normal rate and regular rhythm.  Murmur heard. Pulmonary/Chest: Effort normal and breath sounds normal. No stridor. She has no wheezes.  Abdominal: Soft. Bowel sounds are normal. There is no tenderness.  Lymphadenopathy:    She has no cervical adenopathy.  Neurological: She is alert and oriented to person, place, and time.  Skin: She is diaphoretic.  Psychiatric: She has a normal mood and affect. Her behavior is normal.    ASSESSMENT/PLAN:  1. Flu-like symptoms Even though the flu test is negative, I believe her symptoms are suggestive of  influenza.  I am going to go and treat her with Tamiflu - POCT Influenza A/B   Patient Instructions  Rest Drink lots of fluids Make sure you do not over exert yourself Take Tamiflu for 5 days Take Tamiflu with food Take the nausea pill if you need to Take Tylenol or ibuprofen for the aches and fever May use Robitussin-DM or over-the-counter medicine for cough   Influenza, Adult Influenza ("the flu") is an infection in the lungs, nose, and throat (respiratory tract). It is caused by a virus. The flu causes many common cold symptoms, as well as a high fever and body aches. It can make you feel very sick. The flu spreads easily from person to person (is contagious). Getting a flu shot (influenza vaccination) every year is the best way to prevent the flu. Follow these instructions at home:  Take over-the-counter and prescription medicines only as told by your doctor.  Use a cool mist humidifier to add moisture (humidity) to the air in your home. This can make it easier to breathe.  Rest as needed.  Drink enough fluid to keep your pee (urine) clear or pale yellow.  Cover your mouth and nose when you cough or sneeze.  Wash your hands with soap and water often, especially after you cough or sneeze. If you cannot use soap and water, use hand sanitizer.  Stay home from work or school as told by your doctor. Unless you are visiting your doctor, try to avoid leaving home until your fever has been gone for 24 hours without the use of medicine.  Keep all follow-up visits as told by your doctor. This is important. How is this prevented?  Getting a yearly (annual) flu shot is the best way to avoid getting the flu. You may get the flu shot in late summer, fall, or winter. Ask your doctor when you should get your flu shot.  Wash your hands often or use hand sanitizer often.  Avoid contact with people who are sick during cold and flu season.  Eat healthy foods.  Drink plenty of  fluids.  Get enough sleep.  Exercise regularly. Contact a doctor if:  You get new symptoms.  You have: ? Chest pain. ? Watery poop (diarrhea). ? A fever.  Your cough gets worse.  You start to have more mucus.  You feel sick to your stomach (nauseous).  You throw up (vomit). Get help right away if:  You start to be short of breath or have trouble breathing.  Your skin or nails turn a bluish color.  You have very bad pain or stiffness in your neck.  You get a sudden headache.  You get sudden pain in your face or ear.  You cannot stop throwing up. This information is not intended to replace advice given to  you by your health care provider. Make sure you discuss any questions you have with your health care provider. Document Released: 10/28/2007 Document Revised: 06/26/2015 Document Reviewed: 11/12/2014 Elsevier Interactive Patient Education  2017 [employer]    Raylene Everts, MD

## 2017-04-25 ENCOUNTER — Ambulatory Visit (HOSPITAL_COMMUNITY)
Admission: RE | Admit: 2017-04-25 | Discharge: 2017-04-25 | Disposition: A | Discharge status: Home / Self Care | Payer: Medicare Other | Source: Ambulatory Visit | Attending: Family Medicine | Admitting: Family Medicine

## 2017-04-25 DIAGNOSIS — Z1231 Encounter for screening mammogram for malignant neoplasm of breast: Secondary | ICD-10-CM | POA: Diagnosis not present

## 2017-05-31 ENCOUNTER — Other Ambulatory Visit: Payer: Self-pay

## 2017-05-31 ENCOUNTER — Telehealth: Payer: Self-pay | Admitting: Family Medicine

## 2017-05-31 DIAGNOSIS — E039 Hypothyroidism, unspecified: Secondary | ICD-10-CM

## 2017-05-31 DIAGNOSIS — E559 Vitamin D deficiency, unspecified: Secondary | ICD-10-CM

## 2017-05-31 DIAGNOSIS — Z1329 Encounter for screening for other suspected endocrine disorder: Secondary | ICD-10-CM

## 2017-05-31 MED ORDER — MIRTAZAPINE 30 MG PO TABS: 30.0000 mg | ORAL_TABLET | Freq: Every day | ORAL | 1 refills | Status: DC

## 2017-05-31 NOTE — Telephone Encounter (Signed)
Med sent.

## 2017-05-31 NOTE — Telephone Encounter (Signed)
Patient is requesting refill of sleeping medication

## 2017-06-02 ENCOUNTER — Encounter: Payer: Medicare Other | Admitting: Family Medicine

## 2017-07-18 ENCOUNTER — Other Ambulatory Visit (HOSPITAL_COMMUNITY)
Admission: RE | Admit: 2017-07-18 | Discharge: 2017-07-18 | Disposition: A | Discharge status: Home / Self Care | Payer: Medicare Other | Source: Ambulatory Visit | Attending: Family Medicine | Admitting: Family Medicine

## 2017-07-18 ENCOUNTER — Ambulatory Visit (INDEPENDENT_AMBULATORY_CARE_PROVIDER_SITE_OTHER): Payer: Medicare Other | Admitting: Family Medicine

## 2017-07-18 ENCOUNTER — Encounter: Payer: Self-pay | Admitting: Family Medicine

## 2017-07-18 ENCOUNTER — Other Ambulatory Visit: Payer: Self-pay

## 2017-07-18 VITALS — BP 146/92 | HR 73 | Ht 62.0 in | Wt 139.1 lb

## 2017-07-18 DIAGNOSIS — R569 Unspecified convulsions: Secondary | ICD-10-CM | POA: Diagnosis not present

## 2017-07-18 DIAGNOSIS — E559 Vitamin D deficiency, unspecified: Secondary | ICD-10-CM

## 2017-07-18 DIAGNOSIS — Z124 Encounter for screening for malignant neoplasm of cervix: Secondary | ICD-10-CM

## 2017-07-18 DIAGNOSIS — Z13220 Encounter for screening for lipoid disorders: Secondary | ICD-10-CM | POA: Diagnosis not present

## 2017-07-18 DIAGNOSIS — Z01419 Encounter for gynecological examination (general) (routine) without abnormal findings: Secondary | ICD-10-CM | POA: Diagnosis not present

## 2017-07-18 MED ORDER — MIRTAZAPINE 30 MG PO TABS: 30.0000 mg | ORAL_TABLET | Freq: Every day | ORAL | 1 refills | Status: DC

## 2017-07-18 NOTE — Assessment & Plan Note (Signed)
ExAM AS DOCUMENTED AND PAP SENT

## 2017-07-18 NOTE — Patient Instructions (Addendum)
PLEASE SCHEDULE WELLNESS WITH NURSE  END AUGUST OR EARLY SEPTEMER  F/u with MD end November  CBC, fasting lipid, cmp and EGFR, TSH, vit D and phenobarb level level 1 week before Nov visit  3 stool cards to be returned  Thank you  for choosing [facility]. We consider it a privelige to serve you.  Delivering excellent health care in a caring and  compassionate way is our goal.  Partnering with you,  so that together we can achieve this goal is our strategy.     Congrats on [place] babies!

## 2017-07-22 ENCOUNTER — Encounter: Payer: Self-pay | Admitting: Family Medicine

## 2017-07-22 LAB — CYTOLOGY - PAP
Diagnosis: NEGATIVE
HPV (WINDOPATH): NOT DETECTED

## 2017-07-22 NOTE — Progress Notes (Signed)
   Sandra Riley     MRN: [id number]      DOB: [birth year]   HPI Sandra Riley is here for well woman exam with routine gyne exam and pap. She is doing well and has no concerns/ complaints Does report seizure activity on one occasion since last visit, no resultant injury, she is followed by neurology for her seizures  ROS See HPI    PE  BP (!) 146/92 (BP Location: Left Arm, Patient Position: Sitting, Cuff Size: Normal)   Pulse 73   Ht 5\' 2"  (1.575 m)   Wt 139 lb 1.9 oz (63.1 kg)   BMI 25.45 kg/m   Patient alert and oriented and in no cardiopulmonary distress.  HEENT: No facial asymmetry, EOMI,   oropharynx pink and moist.  Neck supple no JVD, no mass.  Chest: Clear to auscultation bilaterally.  CVS: S1, S2 no murmurs, no S3.Regular rate.  Breast: No asymetry No palpable mass, no nipple inversion or discharge, no axillary or supraclavicular adenopathy  ABD: Soft non tender. No organomegaly or mass palpable. Normal BS Rectal: not done  Pelvic: External genitalia: Normal female distribution of hair, no ulcers or rash noted Vaginal walls moist, no ulcers, no prolapse Uterus absent, no cervix, no adnexal mass or tenderness, physiologic discharge   Ext: No edema  MS: Adequate ROM spine, shoulders, hips and knees.   CNS: CN 2-12 intact, power,  normal throughout.no focal deficits noted.   Assessment & Plan  Well woman exam with routine gynecological exam ExAM AS DOCUMENTED AND PAP SENT

## 2017-07-27 ENCOUNTER — Telehealth: Payer: Self-pay | Admitting: Family Medicine

## 2017-07-27 NOTE — Telephone Encounter (Signed)
Patient left voicemail requesting a call back [phone], no answer.

## 2017-07-29 NOTE — Addendum Note (Signed)
Addended by: Fayrene Helper on: 07/29/2017 09:35 AM   Modules accepted: Level of Service

## 2017-08-29 ENCOUNTER — Ambulatory Visit: Payer: Medicare Other

## 2017-08-29 DIAGNOSIS — G40219 Localization-related (focal) (partial) symptomatic epilepsy and epileptic syndromes with complex partial seizures, intractable, without status epilepticus: Secondary | ICD-10-CM | POA: Diagnosis not present

## 2017-08-29 DIAGNOSIS — F39 Unspecified mood [affective] disorder: Secondary | ICD-10-CM | POA: Diagnosis not present

## 2017-08-29 DIAGNOSIS — G4709 Other insomnia: Secondary | ICD-10-CM | POA: Diagnosis not present

## 2017-08-29 DIAGNOSIS — G40409 Other generalized epilepsy and epileptic syndromes, not intractable, without status epilepticus: Secondary | ICD-10-CM | POA: Diagnosis not present

## 2017-09-19 ENCOUNTER — Ambulatory Visit (INDEPENDENT_AMBULATORY_CARE_PROVIDER_SITE_OTHER): Payer: Medicare Other

## 2017-09-19 VITALS — BP 147/98 | HR 87 | Resp 16 | Ht 62.0 in | Wt 141.0 lb

## 2017-09-19 DIAGNOSIS — Z0000 Encounter for general adult medical examination without abnormal findings: Secondary | ICD-10-CM | POA: Diagnosis not present

## 2017-09-19 NOTE — Patient Instructions (Signed)
Ms. Sandra Riley , Thank you for taking time to come for your Medicare Wellness Visit. I appreciate your ongoing commitment to your health goals. Please review the following plan we discussed and let me know if I can assist you in the future.   Screening recommendations/referrals: Colonoscopy: up to date  Mammogram: up to date  Bone Density: n/a Recommended yearly ophthalmology/optometry visit for glaucoma screening and checkup Recommended yearly dental visit for hygiene and checkup  Vaccinations: Influenza vaccine: refused today  Pneumococcal vaccine: up to date  Tdap vaccine: up to date  Shingles vaccine: ask insurance if covered (shingrix)     Advanced directives: given   Conditions/risks identified: done   Next appointment: done   Preventive Care 40-64 Years, Female Preventive care refers to lifestyle choices and visits with your health care provider that can promote health and wellness. What does preventive care include?  A yearly physical exam. This is also called an annual well check.  Dental exams once or twice a year.  Routine eye exams. Ask your health care provider how often you should have your eyes checked.  Personal lifestyle choices, including:  Daily care of your teeth and gums.  Regular physical activity.  Eating a healthy diet.  Avoiding tobacco and drug use.  Limiting alcohol use.  Practicing safe sex.  Taking low-dose aspirin daily starting at age 70.  Taking vitamin and mineral supplements as recommended by your health care provider. What happens during an annual well check? The services and screenings done by your health care provider during your annual well check will depend on your age, overall health, lifestyle risk factors, and family history of disease. Counseling  Your health care provider may ask you questions about your:  Alcohol use.  Tobacco use.  Drug use.  Emotional well-being.  Home and relationship well-being.  Sexual  activity.  Eating habits.  Work and work Statistician.  Method of birth control.  Menstrual cycle.  Pregnancy history. Screening  You may have the following tests or measurements:  Height, weight, and BMI.  Blood pressure.  Lipid and cholesterol levels. These may be checked every 5 years, or more frequently if you are over 58 years old.  Skin check.  Lung cancer screening. You may have this screening every year starting at age 57 if you have a 30-pack-year history of smoking and currently smoke or have quit within the past 15 years.  Fecal occult blood test (FOBT) of the stool. You may have this test every year starting at age 75.  Flexible sigmoidoscopy or colonoscopy. You may have a sigmoidoscopy every 5 years or a colonoscopy every 10 years starting at age 75.  Hepatitis C blood test.  Hepatitis B blood test.  Sexually transmitted disease (STD) testing.  Diabetes screening. This is done by checking your blood sugar (glucose) after you have not eaten for a while (fasting). You may have this done every 1-3 years.  Mammogram. This may be done every 1-2 years. Talk to your health care provider about when you should start having regular mammograms. This may depend on whether you have a family history of breast cancer.  BRCA-related cancer screening. This may be done if you have a family history of breast, ovarian, tubal, or peritoneal cancers.  Pelvic exam and Pap test. This may be done every 3 years starting at age 97. Starting at age 36, this may be done every 5 years if you have a Pap test in combination with an HPV test.  Bone  density scan. This is done to screen for osteoporosis. You may have this scan if you are at high risk for osteoporosis. Discuss your test results, treatment options, and if necessary, the need for more tests with your health care provider. Vaccines  Your health care provider may recommend certain vaccines, such as:  Influenza vaccine. This is  recommended every year.  Tetanus, diphtheria, and acellular pertussis (Tdap, Td) vaccine. You may need a Td booster every 10 years.  Zoster vaccine. You may need this after age 82.  Pneumococcal 13-valent conjugate (PCV13) vaccine. You may need this if you have certain conditions and were not previously vaccinated.  Pneumococcal polysaccharide (PPSV23) vaccine. You may need one or two doses if you smoke cigarettes or if you have certain conditions. Talk to your health care provider about which screenings and vaccines you need and how often you need them. This information is not intended to replace advice given to you by your health care provider. Make sure you discuss any questions you have with your health care provider. Document Released: 02/14/2015 Document Revised: 10/08/2015 Document Reviewed: 11/19/2014 Elsevier Interactive Patient Education  2017 [employer] Prevention in the Home Falls can cause injuries. They can happen to people of all ages. There are many things you can do to make your home safe and to help prevent falls. What can I do on the outside of my home?  Regularly fix the edges of walkways and driveways and fix any cracks.  Remove anything that might make you trip as you walk through a door, such as a raised step or threshold.  Trim any bushes or trees on the path to your home.  Use bright outdoor lighting.  Clear any walking paths of anything that might make someone trip, such as rocks or tools.  Regularly check to see if handrails are loose or broken. Make sure that both sides of any steps have handrails.  Any raised decks and porches should have guardrails on the edges.  Have any leaves, snow, or ice cleared regularly.  Use sand or salt on walking paths during winter.  Clean up any spills in your garage right away. This includes oil or grease spills. What can I do in the bathroom?  Use night lights.  Install grab bars by the toilet and in  the tub and shower. Do not use towel bars as grab bars.  Use non-skid mats or decals in the tub or shower.  If you need to sit down in the shower, use a plastic, non-slip stool.  Keep the floor dry. Clean up any water that spills on the floor as soon as it happens.  Remove soap buildup in the tub or shower regularly.  Attach bath mats securely with double-sided non-slip rug tape.  Do not have throw rugs and other things on the floor that can make you trip. What can I do in the bedroom?  Use night lights.  Make sure that you have a light by your bed that is easy to reach.  Do not use any sheets or blankets that are too big for your bed. They should not hang down onto the floor.  Have a firm chair that has side arms. You can use this for support while you get dressed.  Do not have throw rugs and other things on the floor that can make you trip. What can I do in the kitchen?  Clean up any spills right away.  Avoid walking on wet  floors.  Keep items that you use a lot in easy-to-reach places.  If you need to reach something above you, use a strong step stool that has a grab bar.  Keep electrical cords out of the way.  Do not use floor polish or wax that makes floors slippery. If you must use wax, use non-skid floor wax.  Do not have throw rugs and other things on the floor that can make you trip. What can I do with my stairs?  Do not leave any items on the stairs.  Make sure that there are handrails on both sides of the stairs and use them. Fix handrails that are broken or loose. Make sure that handrails are as long as the stairways.  Check any carpeting to make sure that it is firmly attached to the stairs. Fix any carpet that is loose or worn.  Avoid having throw rugs at the top or bottom of the stairs. If you do have throw rugs, attach them to the floor with carpet tape.  Make sure that you have a light switch at the top of the stairs and the bottom of the stairs. If  you do not have them, ask someone to add them for you. What else can I do to help prevent falls?  Wear shoes that:  Do not have high heels.  Have rubber bottoms.  Are comfortable and fit you well.  Are closed at the toe. Do not wear sandals.  If you use a stepladder:  Make sure that it is fully opened. Do not climb a closed stepladder.  Make sure that both sides of the stepladder are locked into place.  Ask someone to hold it for you, if possible.  Clearly mark and make sure that you can see:  Any grab bars or handrails.  First and last steps.  Where the edge of each step is.  Use tools that help you move around (mobility aids) if they are needed. These include:  Canes.  Walkers.  Scooters.  Crutches.  Turn on the lights when you go into a dark area. Replace any light bulbs as soon as they burn out.  Set up your furniture so you have a clear path. Avoid moving your furniture around.  If any of your floors are uneven, fix them.  If there are any pets around you, be aware of where they are.  Review your medicines with your doctor. Some medicines can make you feel dizzy. This can increase your chance of falling. Ask your doctor what other things that you can do to help prevent falls. This information is not intended to replace advice given to you by your health care provider. Make sure you discuss any questions you have with your health care provider. Document Released: 11/14/2008 Document Revised: 06/26/2015 Document Reviewed: 02/22/2014 Elsevier Interactive Patient Education  2017 [employer].

## 2017-09-19 NOTE — Progress Notes (Signed)
Subjective:   Sandra Riley is a 57 y.o. female who presents for Medicare Annual (Subsequent) preventive examination.  Review of Systems:   Cardiac Risk Factors include: none     Objective:     Vitals: BP (!) 147/98   Pulse 87   Resp 16   Ht 5\' 2"  (1.575 m)   Wt 141 lb (64 kg)   SpO2 98%   BMI 25.79 kg/m   Body mass index is 25.79 kg/m.  Advanced Directives 09/19/2017 09/20/2016  Does Patient Have a Medical Advance Directive? Yes;No No  Does patient want to make changes to medical advance directive? Yes (ED - Information included in AVS) -  Would patient like information on creating a medical advance directive? - Yes (MAU/Ambulatory/Procedural Areas - Information given)    Tobacco Social History   Tobacco Use  Smoking Status Never Smoker  Smokeless Tobacco Never Used     Counseling given: Not Answered   Clinical Intake:  Pre-visit preparation completed: Yes  Pain : No/denies pain Pain Score: 0-No pain     Nutritional Status: BMI 25 -29 Overweight Diabetes: No  How often do you need to have someone help you when you read instructions, pamphlets, or other written materials from your doctor or pharmacy?: 1 - Never What is the last grade level you completed in school?: 12 th grade   Interpreter Needed?: No  Information entered by :: Torian Quintero LPN   Past Medical History:  Diagnosis Date  . Anxiety and depression   . GAD (generalized anxiety disorder)   . Insomnia   . Psychotic disorder ([place])   . Seizure disorder ([place])   . 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 ABDOMINAL HYSTERECTOMY    . CESAREAN SECTION    . CHOLECYSTECTOMY     Family History  Problem Relation Age of Onset  . Breast cancer Mother 109  . Coronary artery disease Father   . Stroke Father 14  . Breast cancer Sister 4  . Breast cancer Sister   . Breast cancer Sister   . Diabetes Brother   . Heart attack Brother    Social  History   Socioeconomic History  . Marital status: Married    Spouse name: Not on file  . Number of children: 3  . Years of education: Not on file  . Highest education level: 12th grade  Occupational History  . Not on file  Social Needs  . Financial resource strain: Not very hard  . Food insecurity:    Worry: Never true    Inability: Never true  . Transportation needs:    Medical: No    Non-medical: No  Tobacco Use  . Smoking status: Never Smoker  . Smokeless tobacco: Never Used  Substance and Sexual Activity  . Alcohol use: No  . Drug use: No  . Sexual activity: Yes    Birth control/protection: Surgical  Lifestyle  . Physical activity:    Days per week: 3 days    Minutes per session: 20 min  . Stress: Only a little  Relationships  . Social connections:    Talks on phone: More than three times a week    Gets together: Twice a week    Attends religious service: More than 4 times per year    Active member of club or organization: No    Attends meetings of clubs or organizations: Never    Relationship status: Married  Other Topics Concern  . Not on file  Social History Narrative  . Not on file    Outpatient Encounter Medications as of 09/19/2017  Medication Sig  . acetaminophen (TYLENOL) 500 MG tablet Take 500 mg by mouth. Take one tablet by mouth two times a day as needed for severe headache, max 6 tabs per week   . aspirin EC 81 MG tablet Take 81 mg by mouth daily.  . Calcium Carb-Cholecalciferol (CALCIUM 1000 + D) 1000-800 MG-UNIT TABS Take 1 tablet by mouth daily.  . clonazePAM (KLONOPIN) 0.5 MG tablet Take 0.5 mg by mouth 3 (three) times daily.  . LevETIRAcetam 500 MG TB3D Two tablets twice daily  . mirtazapine (REMERON) 30 MG tablet Take 1 tablet (30 mg total) by mouth at bedtime.  . Multiple Vitamin (MULTIVITAMIN) tablet Take 1 tablet by mouth daily.  Marland Kitchen PHENobarbital (LUMINAL) 97.2 MG tablet Take 97.2 mg by mouth 2 (two) times daily.   No  facility-administered encounter medications on file as of 09/19/2017.     Activities of Daily Living In your present state of health, do you have any difficulty performing the following activities: 09/19/2017 03/25/2017  Hearing? N N  Vision? N N  Difficulty concentrating or making decisions? N N  Walking or climbing stairs? N N  Dressing or bathing? N N  Doing errands, shopping? N [place] and eating ? N -  Using the Toilet? N -  In the past six months, have you accidently leaked urine? N -  Do you have problems with loss of bowel control? N -  Managing your Medications? N -  Managing your Finances? N -  Housekeeping or managing your Housekeeping? N -  Some recent data might be hidden    Patient Care Team: Fayrene Helper, MD as PCP - General Phillips Odor, MD as Consulting Physician (Neurology)    Assessment:   This is a routine wellness examination for [facility].  Exercise Activities and Dietary recommendations Current Exercise Habits: Home exercise routine, Time (Minutes): 20, Frequency (Times/Week): 3, Weekly Exercise (Minutes/Week): 60, Intensity: Mild  Goals    . Exercise 3x per week (30 min per time)     Recommend increasing your exercise routine at least 3 days a week for 30-45 minutes at a time as tolerated.         Fall Risk Fall Risk  09/19/2017 07/18/2017 03/25/2017 09/20/2016 06/18/2015  Falls in the past year? Yes No No No No  Number falls in past yr: - - - - -  Injury with Fall? - - - - -   Is the patient's home free of loose throw rugs in walkways, pet beds, electrical cords, etc?   yes      Grab bars in the bathroom? yes      Handrails on the stairs?   yes      Adequate lighting?   yes  Timed Get Up and Go performed:   Depression Screen PHQ 2/9 Scores 09/19/2017 07/18/2017 03/25/2017 09/20/2016  PHQ - 2 Score 0 0 0 0  PHQ- 9 Score - - - -     Cognitive Function     6CIT Screen 09/19/2017 09/20/2016  What Year? 0 points 0 points    What month? 0 points 0 points  What time? 0 points 0 points  Count back from 20 0 points 0 points  Months in reverse 0 points 0 points  Repeat phrase 4 points 0 points  Total Score 4 0    Immunization History  Administered Date(s) Administered  . H1N1 12/05/2007  . Influenza Split 12/15/2010, 11/23/2011, 12/02/2014  . Influenza Whole 12/20/2006, 12/10/2009  . Influenza,inj,Quad PF,6+ Mos 12/04/2012  . Pneumococcal Conjugate-13 03/12/2014  . Pneumococcal Polysaccharide-23 03/03/2009  . Td 02/01/2001  . Tdap 09/14/2010    Qualifies for Shingles Vaccine? Ask insurance if covered   Screening Tests Health Maintenance  Topic Date Due  . INFLUENZA VACCINE  09/01/2017  . COLONOSCOPY  11/08/2018  . MAMMOGRAM  04/26/2019  . PAP SMEAR  07/18/2020  . TETANUS/TDAP  09/13/2020  . Hepatitis C Screening  Completed  . HIV Screening  Completed    Cancer Screenings: Lung: Low Dose CT Chest recommended if Age 18-80 years, 30 pack-year currently smoking OR have quit w/in 15years. Patient does not qualify. Breast:  Up to date on Mammogram? Yes   Up to date of Bone Density/Dexa? No n/a Colorectal: up to date   Additional Screenings: Hepatitis C Screening:      Plan:     I have personally reviewed and noted the following in the patient's chart:   . Medical and social history . Use of alcohol, tobacco or illicit drugs  . Current medications and supplements . Functional ability and status . Nutritional status . Physical activity . Advanced directives . List of other physicians . Hospitalizations, surgeries, and ER visits in previous 12 months . Vitals . Screenings to include cognitive, depression, and falls . Referrals and appointments  In addition, I have reviewed and discussed with patient certain preventive protocols, quality metrics, and best practice recommendations. A written personalized care plan for preventive services as well as general preventive health  recommendations were provided to patient.     Kate Sable, LPN, LPN  [id number]/61/6073

## 2017-11-03 DIAGNOSIS — Z23 Encounter for immunization: Secondary | ICD-10-CM | POA: Diagnosis not present

## 2017-12-06 ENCOUNTER — Ambulatory Visit: Payer: Medicare Other | Admitting: Family Medicine

## 2017-12-19 ENCOUNTER — Encounter: Payer: Self-pay | Admitting: Family Medicine

## 2017-12-19 ENCOUNTER — Ambulatory Visit (INDEPENDENT_AMBULATORY_CARE_PROVIDER_SITE_OTHER): Payer: Medicare Other | Admitting: Family Medicine

## 2017-12-19 VITALS — BP 120/84 | HR 75 | Resp 12 | Ht 62.0 in | Wt 145.0 lb

## 2017-12-19 DIAGNOSIS — F5101 Primary insomnia: Secondary | ICD-10-CM

## 2017-12-19 DIAGNOSIS — R569 Unspecified convulsions: Secondary | ICD-10-CM | POA: Diagnosis not present

## 2017-12-19 DIAGNOSIS — Z79899 Other long term (current) drug therapy: Secondary | ICD-10-CM | POA: Diagnosis not present

## 2017-12-19 DIAGNOSIS — E559 Vitamin D deficiency, unspecified: Secondary | ICD-10-CM | POA: Diagnosis not present

## 2017-12-19 DIAGNOSIS — F341 Dysthymic disorder: Secondary | ICD-10-CM

## 2017-12-19 DIAGNOSIS — Z13220 Encounter for screening for lipoid disorders: Secondary | ICD-10-CM | POA: Diagnosis not present

## 2017-12-19 NOTE — Assessment & Plan Note (Signed)
Sleep hygiene reviewed and written information offered also. Prescription sent for  medication needed.  

## 2017-12-19 NOTE — Assessment & Plan Note (Signed)
Controlled, no change in medication  

## 2017-12-19 NOTE — Progress Notes (Signed)
   Sandra Riley     MRN: [id number]      DOB: [birth year]   HPI Sandra Riley is here for follow up and re-evaluation of chronic medical conditions, medication management and review of any available recent lab and radiology data.  Preventive health is updated, specifically  Cancer screening and Immunization.    The PT denies any adverse reactions to current medications since the last visit.  There are no new concerns. Does state that her weight has increased and she needs to work on weight loss There are no specific complaints   ROS Denies recent fever or chills. Denies sinus pressure, nasal congestion, ear pain or sore throat. Denies chest congestion, productive cough or wheezing. Denies chest pains, palpitations and leg swelling Denies abdominal pain, nausea, vomiting,diarrhea or constipation.   Denies dysuria, frequency, hesitancy or incontinence. Denies joint pain, swelling and limitation in mobility. Denies headaches,has had no  Seizures, denies  numbness, or tingling. Denies uncontrolled  depression, anxiety or insomnia. Denies skin break down or rash.   PE  BP 120/84   Pulse 75   Resp 12   Ht 5\' 2"  (1.575 m)   Wt 145 lb 0.6 oz (65.8 kg)   SpO2 97% Comment: room air  BMI 26.53 kg/m   Patient alert and oriented and in no cardiopulmonary distress.  HEENT: No facial asymmetry, EOMI,   oropharynx pink and moist.  Neck supple no JVD, no mass.  Chest: Clear to auscultation bilaterally.  CVS: S1, S2 no murmurs, no S3.Regular rate.  ABD: Soft non tender.   Ext: No edema  MS: Adequate ROM spine, shoulders, hips and knees.  Skin: Intact, no ulcerations or rash noted.  Psych: Good eye contact, normal affect. Memory intact not anxious or depressed appearing.  CNS: CN 2-12 intact, power,  normal throughout.no focal deficits noted.   Assessment & Plan Convulsions Controlled, no change in medication Drug level to be tested in serum today  Insomnia Sleep hygiene  reviewed and written information offered also. Prescription sent for  medication needed.   ANXIETY DEPRESSION Controlled, no change in medication   Vitamin D deficiency Updated lab needed at/ before next visit.

## 2017-12-19 NOTE — Patient Instructions (Addendum)
F/U  In 6 months, call if you need me before  CONGRATS on babies  You need to work on losing 6 pounds in the next 6 month  Labs today, collect order at checkout.  It is important that you exercise regularly at least 30 minutes 5 times a week. If you develop chest pain, have severe difficulty breathing, or feel very tired, stop exercising immediately and seek medical attention   Please increase fruit and vegetable, and drink only water  It is important that you exercise regularly at least 30 minutes 5 times a week. If you develop chest pain, have severe difficulty breathing, or feel very tired, stop exercising immediately and seek medical attention

## 2017-12-19 NOTE — Assessment & Plan Note (Signed)
Updated lab needed at/ before next visit.   

## 2017-12-19 NOTE — Assessment & Plan Note (Signed)
Controlled, no change in medication Drug level to be tested in serum today

## 2017-12-21 ENCOUNTER — Encounter: Payer: Self-pay | Admitting: Family Medicine

## 2017-12-21 LAB — COMPLETE METABOLIC PANEL WITH GFR
AG Ratio: 1.4 (calc) (ref 1.0–2.5)
ALBUMIN MSPROF: 4.3 g/dL (ref 3.6–5.1)
ALKALINE PHOSPHATASE (APISO): 145 U/L — AB (ref 33–130)
ALT: 14 U/L (ref 6–29)
AST: 23 U/L (ref 10–35)
BUN / CREAT RATIO: 7 (calc) (ref 6–22)
BUN: 4 mg/dL — AB (ref 7–25)
CALCIUM: 9.6 mg/dL (ref 8.6–10.4)
CO2: 27 mmol/L (ref 20–32)
CREATININE: 0.54 mg/dL (ref 0.50–1.05)
Chloride: 98 mmol/L (ref 98–110)
GFR, EST AFRICAN AMERICAN: 120 mL/min/{1.73_m2} (ref >=60)
GFR, Est Non African American: 103 mL/min/{1.73_m2} (ref >=60)
GLOBULIN: 3 g/dL (ref 1.9–3.7)
Glucose, Bld: 92 mg/dL (ref 65–139)
Potassium: 4.2 mmol/L (ref 3.5–5.3)
Sodium: 133 mmol/L — ABNORMAL LOW (ref 135–146)
TOTAL PROTEIN: 7.3 g/dL (ref 6.1–8.1)
Total Bilirubin: 0.2 mg/dL (ref 0.2–1.2)

## 2017-12-21 LAB — LIPID PANEL
CHOLESTEROL: 146 mg/dL (ref <200)
HDL: 47 mg/dL — ABNORMAL LOW (ref >50)
LDL Cholesterol (Calc): 78 mg/dL (calc)
Non-HDL Cholesterol (Calc): 99 mg/dL (calc) (ref <130)
Total CHOL/HDL Ratio: 3.1 (calc) (ref <5.0)
Triglycerides: 126 mg/dL (ref <150)

## 2017-12-21 LAB — CBC
HEMATOCRIT: 37.4 % (ref 35.0–45.0)
HEMOGLOBIN: 12.8 g/dL (ref 11.7–15.5)
MCH: 32.1 pg (ref 27.0–33.0)
MCHC: 34.2 g/dL (ref 32.0–36.0)
MCV: 93.7 fL (ref 80.0–100.0)
MPV: 9.6 fL (ref 7.5–12.5)
Platelets: 325 10*3/uL (ref 140–400)
RBC: 3.99 10*6/uL (ref 3.80–5.10)
RDW: 11.3 % (ref 11.0–15.0)
WBC: 4.1 10*3/uL (ref 3.8–10.8)

## 2017-12-21 LAB — PHENOBARBITAL LEVEL: Phenobarbital, Serum: 32.6 mg/L (ref 15.0–40.0)

## 2017-12-21 LAB — TSH: TSH: 1.24 m[IU]/L (ref 0.40–4.50)

## 2017-12-21 LAB — VITAMIN D 25 HYDROXY (VIT D DEFICIENCY, FRACTURES): Vit D, 25-Hydroxy: 58 ng/mL (ref 30–100)

## 2018-01-14 ENCOUNTER — Other Ambulatory Visit: Payer: Self-pay | Admitting: Family Medicine

## 2018-03-01 DIAGNOSIS — F39 Unspecified mood [affective] disorder: Secondary | ICD-10-CM | POA: Diagnosis not present

## 2018-03-01 DIAGNOSIS — Z79899 Other long term (current) drug therapy: Secondary | ICD-10-CM | POA: Diagnosis not present

## 2018-03-01 DIAGNOSIS — G479 Sleep disorder, unspecified: Secondary | ICD-10-CM | POA: Diagnosis not present

## 2018-03-01 DIAGNOSIS — G40219 Localization-related (focal) (partial) symptomatic epilepsy and epileptic syndromes with complex partial seizures, intractable, without status epilepticus: Secondary | ICD-10-CM | POA: Diagnosis not present

## 2018-03-06 ENCOUNTER — Telehealth: Payer: Self-pay

## 2018-03-06 DIAGNOSIS — R509 Fever, unspecified: Secondary | ICD-10-CM | POA: Diagnosis not present

## 2018-03-06 DIAGNOSIS — J111 Influenza due to unidentified influenza virus with other respiratory manifestations: Secondary | ICD-10-CM | POA: Diagnosis not present

## 2018-03-06 NOTE — Telephone Encounter (Signed)
Patient called with a complaint of a non-productive cough, fever, headaches and body aches for the past day. Can you call her something in? Pharmacy is [facility]

## 2018-03-06 NOTE — Telephone Encounter (Signed)
Would recommend acute visit, will request this visit with Jarrett Soho as she has availability in pm , will verify with Profider  before you contact pt in am

## 2018-03-07 DIAGNOSIS — Z886 Allergy status to analgesic agent status: Secondary | ICD-10-CM | POA: Diagnosis not present

## 2018-03-07 DIAGNOSIS — R509 Fever, unspecified: Secondary | ICD-10-CM | POA: Diagnosis not present

## 2018-03-07 DIAGNOSIS — G40909 Epilepsy, unspecified, not intractable, without status epilepticus: Secondary | ICD-10-CM | POA: Diagnosis not present

## 2018-03-07 DIAGNOSIS — J111 Influenza due to unidentified influenza virus with other respiratory manifestations: Secondary | ICD-10-CM | POA: Diagnosis not present

## 2018-03-07 DIAGNOSIS — Z79899 Other long term (current) drug therapy: Secondary | ICD-10-CM | POA: Diagnosis not present

## 2018-03-07 NOTE — Telephone Encounter (Signed)
Tried to call patient a third time, no answer, left a message

## 2018-03-07 NOTE — Telephone Encounter (Signed)
Spoke with Jarrett Soho, she is willing to see her, have tried to call Micha twice today and no answer, left messages.

## 2018-03-13 ENCOUNTER — Other Ambulatory Visit: Payer: Self-pay | Admitting: Family Medicine

## 2018-03-13 ENCOUNTER — Telehealth: Payer: Self-pay | Admitting: *Deleted

## 2018-03-13 MED ORDER — PROMETHAZINE-DM 6.25-15 MG/5ML PO SYRP: ORAL_SOLUTION | ORAL | 0 refills | Status: DC

## 2018-03-13 MED ORDER — ONDANSETRON HCL 4 MG PO TABS: 4.0000 mg | ORAL_TABLET | Freq: Three times a day (TID) | ORAL | 0 refills | Status: DC | PRN

## 2018-03-13 NOTE — Telephone Encounter (Signed)
Please advise. Send to [facility].

## 2018-03-13 NOTE — Telephone Encounter (Signed)
pls let her know I am sending in bedtime cough suppressant and for nausea, limited supply Thanklful she got the treatment for the influenza, takes time to get rid of the cough sometimes , if still a prob with sputum an fever will need office re eval

## 2018-03-13 NOTE — Telephone Encounter (Signed)
Pt called in stating that she was saw in the ER last Monday for the flu. She tested positive. She took her last tamiflu but she still has a bad cough. She would like something called in for the cough. She also needs something called in for nausea. This can be called into [facility] in [place].

## 2018-03-13 NOTE — Telephone Encounter (Signed)
Advised patient of Dr.Simpson's treatment plan with verbal understanding 

## 2018-03-22 ENCOUNTER — Other Ambulatory Visit (HOSPITAL_COMMUNITY): Payer: Self-pay | Admitting: Family Medicine

## 2018-03-22 DIAGNOSIS — Z1231 Encounter for screening mammogram for malignant neoplasm of breast: Secondary | ICD-10-CM

## 2018-05-01 ENCOUNTER — Ambulatory Visit (HOSPITAL_COMMUNITY): Payer: Medicare Other

## 2018-05-15 ENCOUNTER — Other Ambulatory Visit: Payer: Self-pay | Admitting: Family Medicine

## 2018-06-05 ENCOUNTER — Ambulatory Visit (HOSPITAL_COMMUNITY): Payer: Medicare Other

## 2018-06-14 ENCOUNTER — Ambulatory Visit: Payer: Medicare Other | Admitting: Family Medicine

## 2018-06-14 ENCOUNTER — Encounter: Payer: Self-pay | Admitting: *Deleted

## 2018-06-15 ENCOUNTER — Ambulatory Visit (INDEPENDENT_AMBULATORY_CARE_PROVIDER_SITE_OTHER): Payer: Medicare Other | Admitting: Family Medicine

## 2018-06-15 ENCOUNTER — Encounter: Payer: Self-pay | Admitting: Family Medicine

## 2018-06-15 ENCOUNTER — Other Ambulatory Visit: Payer: Self-pay

## 2018-06-15 VITALS — Ht 62.0 in | Wt 145.0 lb

## 2018-06-15 DIAGNOSIS — F5101 Primary insomnia: Secondary | ICD-10-CM

## 2018-06-15 DIAGNOSIS — F341 Dysthymic disorder: Secondary | ICD-10-CM | POA: Diagnosis not present

## 2018-06-15 DIAGNOSIS — E663 Overweight: Secondary | ICD-10-CM | POA: Diagnosis not present

## 2018-06-15 DIAGNOSIS — R569 Unspecified convulsions: Secondary | ICD-10-CM | POA: Diagnosis not present

## 2018-06-15 NOTE — Patient Instructions (Signed)
    Thank you for completing your visit via telephone. I appreciate the opportunity to provide you with the care for your health and wellness. Today we discussed: Overall health and wellness  Continue to stay strong for your grandchildren.  I know that they will be excited to see you in person once it safe again.  Please continue to practice social distancing to keep you, your family and your community safe.  Please continue to take your medications as directed.  If you have any trouble with them or need refills just let us know. I am glad that you are in good spirits even through all of this situation.  Please let us know if you need anything.  I have placed some labs for you to get prior to your next appointment.  We will be mailing these out with this form you should have them in the same information packet that this came in.  It is okay if you misplaced the papers we can always reprint them.  Please just show up to the lab at least 1 week before your next appointment which will be in November.   [place] YOUR HANDS WELL AND FREQUENTLY. AVOID TOUCHING YOUR FACE, UNLESS YOUR HANDS ARE FRESHLY WASHED.  GET FRESH AIR DAILY. STAY HYDRATED WITH WATER.   It was a pleasure to see you and I look forward to continuing to work together on your health and well-being. Please do not hesitate to call the office if you need care or have questions about your care.  Have a wonderful day and week. With Gratitude, Cherly Beach, DNP, AGNP-BC

## 2018-06-15 NOTE — Progress Notes (Signed)
Virtual Visit via Telephone Note   This visit type was conducted due to national recommendations for restrictions regarding the COVID-19 Pandemic (e.g. social distancing) in an effort to limit this patient's exposure and mitigate transmission in our community.  Due to her co-morbid illnesses, this patient is at least at moderate risk for complications without adequate follow up.  This format is felt to be most appropriate for this patient at this time.  The patient did not have access to video technology/had technical difficulties with video requiring transitioning to audio format only (telephone).  All issues noted in this document were discussed and addressed.  No physical exam could be performed with this format.    Evaluation Performed:  Follow-up visit  Date:  06/15/2018   ID:  Sandra Riley, Sandra Riley 04/05/1960, MRN [id number]  Patient Location: Home Provider Location: Other:  Telemedicine  Location of Patient: Home Location of Provider: Telehealth Consent was obtain for visit to be over via telehealth. I verified that I am speaking with the correct person using two identifiers.  PCP:  Fayrene Helper, MD   Chief Complaint: Follow-up on chronic conditions  History of Present Illness:    Sandra Riley is a 58 y.o. female with with history of allergies, anxiety and depression, convulsions, insomnia, vitamin D deficiency.  Sandra Riley is a patient of Dr. Griffin Dakin who is well-known to the clinic.  She is the historian of today's conversation.  She is pleasant and communication.  Denies having any trouble with any of her medications.  Denies needing refills on her medications.  Reports that she is taking her medications as directed.  Sandra Riley is feeling good and without complaints today in the office.  She does wish that she could see her twin granddaughters but she knows that that is not available at this time for the safety and secondary to [place].  She denies  having any fevers, chills, cough, shortness of breath, dizziness, headaches, chest pain, chest tightness, sinus pressure or congestion or any other signs or symptoms of illness.  Additionally she denies having any skin breakdown or wounds that are not healing.  She denies having any trouble with her bowel or bladder.  She reports that she is sleeping okay with her medications.  She denies having any confusion or memory changes.  She does report that she did have a trip/small fall this morning she tripped over the cover of her bed.  She blames it on herself or not moving the cover out of the way.  She did not injure herself however though.  And she has not had any other falls in the last year.  She reported that she did not need to go to the emergency room or urgent care to be checked out.  She denied hitting her head.  She reports that she is exercising and trying to eat well.  To stay healthy during this time.  Reports that she is practicing social distancing and not trying to stay out or go out shopping unless she absolutely has to.  The patient does not have symptoms concerning for COVID-19 infection (fever, chills, cough, or new shortness of breath).    Past Medical History:  Diagnosis Date  . Anxiety and depression   . GAD (generalized anxiety disorder)   . Insomnia   . Psychotic disorder ([place])   . Seizure disorder ([place])   . 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 ABDOMINAL HYSTERECTOMY    . CESAREAN SECTION    . CHOLECYSTECTOMY       No outpatient medications have been marked as taking for the 06/15/18 encounter (Office Visit) with Perlie Mayo, NP.     Allergies:   Aspirin and Singulair [montelukast sodium]   Social History   Tobacco Use  . Smoking status: Never Smoker  . Smokeless tobacco: Never Used  Substance Use Topics  . Alcohol use: No  . Drug use: No     Family Hx: The patient's family history includes Breast  cancer in her sister and sister; Breast cancer (age of onset: 15) in her mother; Breast cancer (age of onset: 7) in her sister; Coronary artery disease in her father; Diabetes in her brother; Heart attack in her brother; Stroke (age of onset: 56) in her father.  ROS:   Please see the history of present illness.    Review of Systems  Constitutional: Negative for activity change, appetite change, chills and fever.  HENT: Negative.   Eyes: Negative.   Respiratory: Negative for cough, chest tightness, shortness of breath and wheezing.   Cardiovascular: Negative for chest pain, palpitations and leg swelling.  Gastrointestinal: Negative.   Endocrine: Negative.   Genitourinary: Negative.   Musculoskeletal: Negative.   Skin: Negative.   Allergic/Immunologic: Negative.   Neurological: Negative for dizziness and headaches.  Hematological: Negative.   Psychiatric/Behavioral: Negative.   All other systems reviewed and are negative.   Labs/Other Tests and Data Reviewed:    Recent Labs: 12/19/2017: ALT 14; BUN 4; Creat 0.54; Hemoglobin 12.8; Platelets 325; Potassium 4.2; Sodium 133; TSH 1.24   Recent Lipid Panel Lab Results  Component Value Date/Time   CHOL 146 12/19/2017 03:57 PM   TRIG 126 12/19/2017 03:57 PM   HDL 47 (L) 12/19/2017 03:57 PM   CHOLHDL 3.1 12/19/2017 03:57 PM   LDLCALC 78 12/19/2017 03:57 PM    Wt Readings from Last 3 Encounters:  06/15/18 145 lb (65.8 kg)  12/19/17 145 lb 0.6 oz (65.8 kg)  09/19/17 141 lb (64 kg)     Objective:    Vital Signs:  Ht 5\' 2"  (1.575 m)   Wt 145 lb (65.8 kg)   BMI 26.52 kg/m    GEN:  Alert and oriented RESPIRATORY:  No noted shortness of breath on conversation PSYCH:  normal affect, mood, good communication and pleasant in conversation  ASSESSMENT & PLAN:    1. Convulsions, unspecified convulsion type ([place]) Controlled, no change in medication.  Will assess with future labs.  - CBC with Differential/Platelet - COMPLETE  METABOLIC PANEL WITH GFR - Phenobarbital level -Levetiracetam level  2. ANXIETY DEPRESSION Controlled, reports not having any excessive anxiety or depression even though she is unable to see her grandchildren.  Maintain current regime.  3. Primary insomnia Controlled, does not feel that she is not sleeping well.  Reports that her medicine is doing well.  Previous sleep hygiene was reviewed with her.    4. Overweight (BMI 25.0-29.9) Overweight, reports that she has been exercising and trying to eat right.  Will assess lipid panel in future.  Provided with information regarding exercise and diet.  Encouraged to exercise 30 minutes on most days of the week.  - Lipid panel  Time:   Today, I have spent 10 minutes with the patient with telehealth technology discussing the above problems.     Medication Adjustments/Labs and Tests Ordered: Current medicines are reviewed at length with the patient today.  Concerns regarding medicines are outlined above.   Tests Ordered: Orders Placed This Encounter  Procedures  . CBC with Differential/Platelet  . COMPLETE METABOLIC PANEL WITH GFR  . Lipid panel  . Phenobarbital level  . Levetiracetam level    Medication Changes: No orders of the defined types were placed in this encounter.   Disposition:  Follow up November labs 1 week before  Signed, Perlie Mayo, NP  06/15/2018 3:14 PM     [place]

## 2018-06-19 ENCOUNTER — Other Ambulatory Visit: Payer: Self-pay

## 2018-06-19 ENCOUNTER — Ambulatory Visit: Payer: Medicare Other | Admitting: Family Medicine

## 2018-06-19 ENCOUNTER — Ambulatory Visit (HOSPITAL_COMMUNITY)
Admission: RE | Admit: 2018-06-19 | Discharge: 2018-06-19 | Disposition: A | Discharge status: Home / Self Care | Payer: Medicare Other | Source: Ambulatory Visit | Attending: Family Medicine | Admitting: Family Medicine

## 2018-06-19 DIAGNOSIS — Z1231 Encounter for screening mammogram for malignant neoplasm of breast: Secondary | ICD-10-CM | POA: Diagnosis not present

## 2018-08-28 DIAGNOSIS — G40219 Localization-related (focal) (partial) symptomatic epilepsy and epileptic syndromes with complex partial seizures, intractable, without status epilepticus: Secondary | ICD-10-CM | POA: Diagnosis not present

## 2018-08-28 DIAGNOSIS — G4709 Other insomnia: Secondary | ICD-10-CM | POA: Diagnosis not present

## 2018-08-28 DIAGNOSIS — F39 Unspecified mood [affective] disorder: Secondary | ICD-10-CM | POA: Diagnosis not present

## 2018-08-28 DIAGNOSIS — G40409 Other generalized epilepsy and epileptic syndromes, not intractable, without status epilepticus: Secondary | ICD-10-CM | POA: Diagnosis not present

## 2018-09-21 ENCOUNTER — Ambulatory Visit (INDEPENDENT_AMBULATORY_CARE_PROVIDER_SITE_OTHER): Payer: Medicare Other | Admitting: Family Medicine

## 2018-09-21 ENCOUNTER — Encounter: Payer: Self-pay | Admitting: Family Medicine

## 2018-09-21 ENCOUNTER — Other Ambulatory Visit: Payer: Self-pay

## 2018-09-21 VITALS — BP 120/84 | HR 75 | Resp 15 | Ht 62.0 in | Wt 145.0 lb

## 2018-09-21 DIAGNOSIS — Z0000 Encounter for general adult medical examination without abnormal findings: Secondary | ICD-10-CM | POA: Diagnosis not present

## 2018-09-21 NOTE — Patient Instructions (Addendum)
Ms. Hornik , Thank you for taking time to come for your Medicare Wellness Visit. I appreciate your ongoing commitment to your health goals. Please review the following plan we discussed and let me know if I can assist you in the future.   Please continue to practice social distancing to keep you, your family, and our community safe.  If you must go out, please wear a Mask and practice good handwashing.  Screening recommendations/referrals: Colonoscopy: Due 2020 Mammogram: Up to date Bone Density: N/a  Recommended yearly ophthalmology/optometry visit for glaucoma screening and checkup Recommended yearly dental visit for hygiene and checkup  Vaccinations: Influenza vaccine: Due Fall 2020 Pneumococcal vaccine: Completed Tdap vaccine: Due 2022 Shingles vaccine: Call for insurance coverage     Advanced directives: Declined  Conditions/risks identified: Fall  Next appointment: 12/18/2018   Preventive Care 40-64 Years, Female Preventive care refers to lifestyle choices and visits with your health care provider that can promote health and wellness. What does preventive care include?  A yearly physical exam. This is also called an annual well check.  Dental exams once or twice a year.  Routine eye exams. Ask your health care provider how often you should have your eyes checked.  Personal lifestyle choices, including:  Daily care of your teeth and gums.  Regular physical activity.  Eating a healthy diet.  Avoiding tobacco and drug use.  Limiting alcohol use.  Practicing safe sex.  Taking low-dose aspirin daily starting at age 61.  Taking vitamin and mineral supplements as recommended by your health care provider. What happens during an annual well check? The services and screenings done by your health care provider during your annual well check will depend on your age, overall health, lifestyle risk factors, and family history of disease. Counseling  Your health care  provider may ask you questions about your:  Alcohol use.  Tobacco use.  Drug use.  Emotional well-being.  Home and relationship well-being.  Sexual activity.  Eating habits.  Work and work Statistician.  Method of birth control.  Menstrual cycle.  Pregnancy history. Screening  You may have the following tests or measurements:  Height, weight, and BMI.  Blood pressure.  Lipid and cholesterol levels. These may be checked every 5 years, or more frequently if you are over 72 years old.  Skin check.  Lung cancer screening. You may have this screening every year starting at age 15 if you have a 30-pack-year history of smoking and currently smoke or have quit within the past 15 years.  Fecal occult blood test (FOBT) of the stool. You may have this test every year starting at age 63.  Flexible sigmoidoscopy or colonoscopy. You may have a sigmoidoscopy every 5 years or a colonoscopy every 10 years starting at age 64.  Hepatitis C blood test.  Hepatitis B blood test.  Sexually transmitted disease (STD) testing.  Diabetes screening. This is done by checking your blood sugar (glucose) after you have not eaten for a while (fasting). You may have this done every 1-3 years.  Mammogram. This may be done every 1-2 years. Talk to your health care provider about when you should start having regular mammograms. This may depend on whether you have a family history of breast cancer.  BRCA-related cancer screening. This may be done if you have a family history of breast, ovarian, tubal, or peritoneal cancers.  Pelvic exam and Pap test. This may be done every 3 years starting at age 32. Starting at age 70, this  may be done every 5 years if you have a Pap test in combination with an HPV test.  Bone density scan. This is done to screen for osteoporosis. You may have this scan if you are at high risk for osteoporosis. Discuss your test results, treatment options, and if necessary, the need  for more tests with your health care provider. Vaccines  Your health care provider may recommend certain vaccines, such as:  Influenza vaccine. This is recommended every year.  Tetanus, diphtheria, and acellular pertussis (Tdap, Td) vaccine. You may need a Td booster every 10 years.  Zoster vaccine. You may need this after age 7.  Pneumococcal 13-valent conjugate (PCV13) vaccine. You may need this if you have certain conditions and were not previously vaccinated.  Pneumococcal polysaccharide (PPSV23) vaccine. You may need one or two doses if you smoke cigarettes or if you have certain conditions. Talk to your health care provider about which screenings and vaccines you need and how often you need them. This information is not intended to replace advice given to you by your health care provider. Make sure you discuss any questions you have with your health care provider. Document Released: 02/14/2015 Document Revised: 10/08/2015 Document Reviewed: 11/19/2014 Elsevier Interactive Patient Education  2017 [place] Prevention in the Home Falls can cause injuries. They can happen to people of all ages. There are many things you can do to make your home safe and to help prevent falls. What can I do on the outside of my home?  Regularly fix the edges of walkways and driveways and fix any cracks.  Remove anything that might make you trip as you walk through a door, such as a raised step or threshold.  Trim any bushes or trees on the path to your home.  Use bright outdoor lighting.  Clear any walking paths of anything that might make someone trip, such as rocks or tools.  Regularly check to see if handrails are loose or broken. Make sure that both sides of any steps have handrails.  Any raised decks and porches should have guardrails on the edges.  Have any leaves, snow, or ice cleared regularly.  Use sand or salt on walking paths during winter.  Clean up any spills in  your garage right away. This includes oil or grease spills. What can I do in the bathroom?  Use night lights.  Install grab bars by the toilet and in the tub and shower. Do not use towel bars as grab bars.  Use non-skid mats or decals in the tub or shower.  If you need to sit down in the shower, use a plastic, non-slip stool.  Keep the floor dry. Clean up any water that spills on the floor as soon as it happens.  Remove soap buildup in the tub or shower regularly.  Attach bath mats securely with double-sided non-slip rug tape.  Do not have throw rugs and other things on the floor that can make you trip. What can I do in the bedroom?  Use night lights.  Make sure that you have a light by your bed that is easy to reach.  Do not use any sheets or blankets that are too big for your bed. They should not hang down onto the floor.  Have a firm chair that has side arms. You can use this for support while you get dressed.  Do not have throw rugs and other things on the floor that can make you  trip. What can I do in the kitchen?  Clean up any spills right away.  Avoid walking on wet floors.  Keep items that you use a lot in easy-to-reach places.  If you need to reach something above you, use a strong step stool that has a grab bar.  Keep electrical cords out of the way.  Do not use floor polish or wax that makes floors slippery. If you must use wax, use non-skid floor wax.  Do not have throw rugs and other things on the floor that can make you trip. What can I do with my stairs?  Do not leave any items on the stairs.  Make sure that there are handrails on both sides of the stairs and use them. Fix handrails that are broken or loose. Make sure that handrails are as long as the stairways.  Check any carpeting to make sure that it is firmly attached to the stairs. Fix any carpet that is loose or worn.  Avoid having throw rugs at the top or bottom of the stairs. If you do have  throw rugs, attach them to the floor with carpet tape.  Make sure that you have a light switch at the top of the stairs and the bottom of the stairs. If you do not have them, ask someone to add them for you. What else can I do to help prevent falls?  Wear shoes that:  Do not have high heels.  Have rubber bottoms.  Are comfortable and fit you well.  Are closed at the toe. Do not wear sandals.  If you use a stepladder:  Make sure that it is fully opened. Do not climb a closed stepladder.  Make sure that both sides of the stepladder are locked into place.  Ask someone to hold it for you, if possible.  Clearly mark and make sure that you can see:  Any grab bars or handrails.  First and last steps.  Where the edge of each step is.  Use tools that help you move around (mobility aids) if they are needed. These include:  Canes.  Walkers.  Scooters.  Crutches.  Turn on the lights when you go into a dark area. Replace any light bulbs as soon as they burn out.  Set up your furniture so you have a clear path. Avoid moving your furniture around.  If any of your floors are uneven, fix them.  If there are any pets around you, be aware of where they are.  Review your medicines with your doctor. Some medicines can make you feel dizzy. This can increase your chance of falling. Ask your doctor what other things that you can do to help prevent falls. This information is not intended to replace advice given to you by your health care provider. Make sure you discuss any questions you have with your health care provider. Document Released: 11/14/2008 Document Revised: 06/26/2015 Document Reviewed: 02/22/2014 Elsevier Interactive Patient Education  2017 [employer].

## 2018-09-21 NOTE — Progress Notes (Signed)
Subjective:   Sandra Riley is a 58 y.o. female who presents for Medicare Annual (Subsequent) preventive examination.  Location of Patient: Home Location of Provider: Telehealth Consent was obtain for visit to be over via telehealth.  I verified that I am speaking with the correct person using two identifiers.   Review of Systems:    Cardiac Risk Factors include: none     Objective:     Vitals: BP 120/84   Pulse 75   Resp 15   Ht 5\' 2"  (1.575 m)   Wt 145 lb (65.8 kg)   BMI 26.52 kg/m   Body mass index is 26.52 kg/m.  Advanced Directives 09/19/2017 09/20/2016  Does Patient Have a Medical Advance Directive? Yes;No No  Does patient want to make changes to medical advance directive? Yes (ED - Information included in AVS) -  Would patient like information on creating a medical advance directive? - Yes (MAU/Ambulatory/Procedural Areas - Information given)    Tobacco Social History   Tobacco Use  Smoking Status Never Smoker  Smokeless Tobacco Never Used     Counseling given: Yes   Clinical Intake:  Pre-visit preparation completed: Yes  Pain : No/denies pain Pain Score: 0-No pain     Nutritional Status: BMI 25 -29 Overweight Nutritional Risks: None Diabetes: No  How often do you need to have someone help you when you read instructions, pamphlets, or other written materials from your doctor or pharmacy?: 1 - Never What is the last grade level you completed in school?: 12  Interpreter Needed?: No     Past Medical History:  Diagnosis Date  . Anxiety and depression   . GAD (generalized anxiety disorder)   . Insomnia   . Psychotic disorder ([place])   . Seizure disorder ([place])   . 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 ABDOMINAL HYSTERECTOMY    . CESAREAN SECTION    . CHOLECYSTECTOMY     Family History  Problem Relation Age of Onset  . Breast cancer Mother 28  . Coronary artery disease Father   .  Stroke Father 76  . Breast cancer Sister 29  . Breast cancer Sister   . Breast cancer Sister   . Diabetes Brother   . Heart attack Brother    Social History   Socioeconomic History  . Marital status: Married    Spouse name: Not on file  . Number of children: 3  . Years of education: Not on file  . Highest education level: 12th grade  Occupational History  . Not on file  Social Needs  . Financial resource strain: Not very hard  . Food insecurity    Worry: Never true    Inability: Never true  . Transportation needs    Medical: No    Non-medical: No  Tobacco Use  . Smoking status: Never Smoker  . Smokeless tobacco: Never Used  Substance and Sexual Activity  . Alcohol use: No  . Drug use: No  . Sexual activity: Yes    Birth control/protection: Surgical  Lifestyle  . Physical activity    Days per week: 3 days    Minutes per session: 20 min  . Stress: Only a little  Relationships  . Social connections    Talks on phone: More than three times a week    Gets together: Twice a week    Attends religious service: More than 4 times per year    Active  member of club or organization: No    Attends meetings of clubs or organizations: Never    Relationship status: Married  Other Topics Concern  . Not on file  Social History Narrative  . Not on file    Outpatient Encounter Medications as of 09/21/2018  Medication Sig  . acetaminophen (TYLENOL) 500 MG tablet Take 500 mg by mouth. Take one tablet by mouth two times a day as needed for severe headache, max 6 tabs per week   . aspirin EC 81 MG tablet Take 81 mg by mouth daily.  . Calcium Carb-Cholecalciferol (CALCIUM 1000 + D) 1000-800 MG-UNIT TABS Take 1 tablet by mouth daily.  . clonazePAM (KLONOPIN) 0.5 MG tablet Take 0.5 mg by mouth 2 (two) times daily.  . LevETIRAcetam 500 MG TB3D Two tablets twice daily  . mirtazapine (REMERON) 30 MG tablet TAKE ONE TABLET BY MOUTH AT BEDTIME  . Multiple Vitamin (MULTIVITAMIN) tablet Take  1 tablet by mouth daily.  . ondansetron (ZOFRAN) 4 MG tablet Take 1 tablet (4 mg total) by mouth every 8 (eight) hours as needed for nausea or vomiting.  Marland Kitchen PHENobarbital (LUMINAL) 97.2 MG tablet Take 97.2 mg by mouth 2 (two) times daily.  . promethazine-dextromethorphan (PROMETHAZINE-DM) 6.25-15 MG/5ML syrup One teaspoon at bedtime, as needed, for excessive cough   No facility-administered encounter medications on file as of 09/21/2018.     Activities of Daily Living In your present state of health, do you have any difficulty performing the following activities: 09/21/2018  Hearing? N  Vision? N  Difficulty concentrating or making decisions? N  Walking or climbing stairs? N  Dressing or bathing? N  Doing errands, shopping? N  Preparing Food and eating ? N  Using the Toilet? N  In the past six months, have you accidently leaked urine? N  Do you have problems with loss of bowel control? N  Managing your Medications? N  Managing your Finances? N  Housekeeping or managing your Housekeeping? N  Some recent data might be hidden    Patient Care Team: Fayrene Helper, MD as PCP - General Phillips Odor, MD as Consulting Physician (Neurology)    Assessment:   This is a routine wellness examination for [facility].  Exercise Activities and Dietary recommendations Current Exercise Habits: Home exercise routine, Type of exercise: walking, Time (Minutes): 35, Frequency (Times/Week): 7, Weekly Exercise (Minutes/Week): 245, Intensity: Mild, Exercise limited by: None identified  Goals    . Exercise 3x per week (30 min per time)     Recommend increasing your exercise routine at least 3 days a week for 30-45 minutes at a time as tolerated.         Fall Risk Fall Risk  09/21/2018 06/15/2018 12/19/2017 09/19/2017 07/18/2017  Falls in the past year? 0 1 0 Yes No  Number falls in past yr: 0 0 0 - -  Injury with Fall? 0 0 0 - -  Follow up - Falls evaluation completed;Education provided;Falls  prevention discussed;Follow up appointment - - -   Is the patient's home free of loose throw rugs in walkways, pet beds, electrical cords, etc?   yes      Grab bars in the bathroom? yes      Handrails on the stairs?   no      Adequate lighting?   yes     Depression Screen PHQ 2/9 Scores 09/21/2018 06/15/2018 12/19/2017 09/19/2017  PHQ - 2 Score 3 0 0 0  PHQ- 9 Score 3 - - -  Cognitive Function     6CIT Screen 09/21/2018 09/19/2017 09/20/2016  What Year? 0 points 0 points 0 points  What month? 0 points 0 points 0 points  What time? 0 points 0 points 0 points  Count back from 20 0 points 0 points 0 points  Months in reverse 0 points 0 points 0 points  Repeat phrase 0 points 4 points 0 points  Total Score 0 4 0    Immunization History  Administered Date(s) Administered  . H1N1 12/05/2007  . Influenza Split 12/15/2010, 11/23/2011, 12/02/2014  . Influenza Whole 12/20/2006, 12/10/2009  . Influenza,inj,Quad PF,6+ Mos 12/04/2012  . Pneumococcal Conjugate-13 03/12/2014  . Pneumococcal Polysaccharide-23 03/03/2009  . Td 02/01/2001  . Tdap 09/14/2010    Qualifies for Shingles Vaccine?   Checking coverage  Screening Tests Health Maintenance  Topic Date Due  . INFLUENZA VACCINE  09/02/2018  . COLONOSCOPY  11/08/2018  . MAMMOGRAM  06/18/2020  . PAP SMEAR-Modifier  07/18/2020  . TETANUS/TDAP  09/13/2020  . Hepatitis C Screening  Completed  . HIV Screening  Completed    Cancer Screenings: Lung: Low Dose CT Chest recommended if Age 57-80 years, 30 pack-year currently smoking OR have quit w/in 15years. Patient does not qualify. Breast:  Up to date on Mammogram? Yes   Up to date of Bone Density/Dexa? n/a Colorectal:  Due 2020  Additional Screenings:    Hepatitis C Screening: completed     Plan:     I have personally reviewed and noted the following in the patient's chart:   . Medical and social history . Use of alcohol, tobacco or illicit drugs  . Current medications  and supplements . Functional ability and status . Nutritional status . Physical activity . Advanced directives . List of other physicians . Hospitalizations, surgeries, and ER visits in previous 12 months . Vitals . Screenings to include cognitive, depression, and falls . Referrals and appointments  In addition, I have reviewed and discussed with patient certain preventive protocols, quality metrics, and best practice recommendations. A written personalized care plan for preventive services as well as general preventive health recommendations were provided to patient.     Perlie Mayo, NP  09/21/2018

## 2018-09-25 ENCOUNTER — Ambulatory Visit: Payer: Medicare Other

## 2018-11-01 ENCOUNTER — Encounter: Payer: Self-pay | Admitting: Internal Medicine

## 2018-11-10 ENCOUNTER — Other Ambulatory Visit: Payer: Self-pay | Admitting: Family Medicine

## 2018-11-21 ENCOUNTER — Ambulatory Visit (INDEPENDENT_AMBULATORY_CARE_PROVIDER_SITE_OTHER): Payer: Medicare Other | Admitting: Internal Medicine

## 2018-11-21 ENCOUNTER — Encounter: Payer: Self-pay | Admitting: Internal Medicine

## 2018-11-21 ENCOUNTER — Other Ambulatory Visit: Payer: Self-pay

## 2018-11-21 VITALS — BP 183/81 | HR 67 | Temp 97.0°F | Ht 62.0 in | Wt 143.6 lb

## 2018-11-21 DIAGNOSIS — Z86010 Personal history of colonic polyps: Secondary | ICD-10-CM

## 2018-11-21 MED ORDER — PEG 3350-KCL-NA BICARB-NACL 420 G PO SOLR: 4000.0000 mL | ORAL | 0 refills | Status: DC

## 2018-11-21 NOTE — Progress Notes (Signed)
Primary Care Physician:  Fayrene Helper, MD Primary Gastroenterologist:  Dr. Gala Romney  Pre-Procedure History & Physical: HPI:  Sandra Riley is a 58 y.o. female here for a screening colonoscopy.  Patient underwent a screening exam back on November 07, 2008.  Patient was found to have a diminutive polyp which was cold biopsy/removed.  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  Adenoma.  Patient has no lower GI tract symptoms.  No family history of colon cancer.  She is here to set up a screening examination.  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 Authorizing Provider  acetaminophen (TYLENOL) 500 MG tablet Take 500 mg by mouth as needed. Take one tablet by mouth two times a day as needed for severe headache, max 6 tabs per week    Yes [provider]  aspirin EC 81 MG tablet Take 81 mg by mouth daily.   Yes [provider]  Calcium Carb-Cholecalciferol (CALCIUM 1000 + D) 1000-800 MG-UNIT TABS Take 1 tablet by mouth daily.   Yes [provider]  Chlorphen-PE-Acetaminophen (ALLERGY MULTI-SYMPTOM PO) Take by mouth as needed.   Yes [provider]  clonazePAM (KLONOPIN) 0.5 MG tablet Take 1 mg by mouth 2 (two) times daily.  12/03/13  Yes [provider]  LevETIRAcetam 500 MG TB3D Two tablets twice daily 03/30/16  Yes Fayrene Helper, MD  MAGNESIUM PO Take by mouth daily.   Yes [provider]  mirtazapine (REMERON) 30 MG tablet TAKE ONE TABLET BY MOUTH AT BEDTIME 11/13/18  Yes Fayrene Helper, MD  Multiple Vitamin (MULTIVITAMIN) tablet Take 1 tablet by mouth daily.   Yes  [provider]  ondansetron (ZOFRAN) 4 MG tablet Take 1 tablet (4 mg total) by mouth every 8 (eight) hours as needed for nausea or vomiting. Patient taking differently: Take 4 mg by mouth as needed for nausea or vomiting.  03/13/18  Yes Fayrene Helper, MD  PHENobarbital (LUMINAL) 97.2 MG tablet Take 97.2 mg by mouth 2 (two) times daily.   Yes [provider]  Potassium 95 MG TABS Take by mouth daily.   Yes [provider]  promethazine-dextromethorphan (PROMETHAZINE-DM) 6.25-15 MG/5ML syrup One teaspoon at bedtime, as needed, for excessive cough Patient not taking: Reported on 11/21/2018 03/13/18   Fayrene Helper, MD    Allergies as of 11/21/2018 - Review Complete 11/21/2018  Allergen Reaction Noted   Aspirin  02/21/2007   Singulair [montelukast sodium] Other (See Comments) 03/30/2016    Family History  Problem Relation Age of Onset   Breast cancer Mother 1   Coronary artery disease Father    Stroke Father 15   Breast cancer Sister 49   Breast cancer Sister    Breast cancer Sister    Diabetes Brother    Heart attack Brother     Social History   Socioeconomic History   Marital status: Married    Spouse name: Not on file   Number of children: 3   Years of education: Not on file   Highest  education level: 12th grade  Occupational History   Not on file  Social Needs   Financial resource strain: Not very hard   Food insecurity    Worry: Never true    Inability: Never true   Transportation needs    Medical: No    Non-medical: No  Tobacco Use   Smoking status: Never Smoker   Smokeless tobacco: Never Used  Substance and Sexual Activity   Alcohol use: No   Drug use: No   Sexual activity: Yes    Birth control/protection: Surgical  Lifestyle   Physical activity    Days per week: 3 days    Minutes per session: 20 min   Stress: Only a little  Relationships   Social connections    Talks on phone: More than  three times a week    Gets together: Twice a week    Attends religious service: More than 4 times per year    Active member of club or organization: No    Attends meetings of clubs or organizations: Never    Relationship status: Married   Intimate partner violence    Fear of current or ex partner: No    Emotionally abused: No    Physically abused: No    Forced sexual activity: No  Other Topics Concern   Not on file  Social History Narrative   Not on file    Review of Systems: See HPI, otherwise negative ROS  Physical Exam: BP (!) 183/81    Pulse 67    Temp (!) 97 F (36.1 C)    Ht 5\' 2"  (1.575 m)    Wt 143 lb 9.6 oz (65.1 kg)    BMI 26.26 kg/m  General:   Alert,   pleasant and cooperative in NAD Neck:  Supple; no masses or thyromegaly. No significant cervical adenopathy. Lungs:  Clear throughout to auscultation.   No wheezes, crackles, or rhonchi. No acute distress. Heart:  Regular rate and rhythm; no murmurs, clicks, rubs,  or gallops. Abdomen: Non-distended, normal bowel sounds.  Soft and nontender without appreciable mass or hepatosplenomegaly.  Pulses:  Normal pulses noted. Extremities:  Without clubbing or edema.  Impression/Plan: 58 year old lady due for a follow-up colonoscopy.  Diminutive adenoma removed from her cecum 10 years ago.  She is currently devoid of any lower GI tract symptoms..  No family history of colon cancer. I have offered Sandra Riley a follow-up colonoscopy in the near future.  We will need to use propofol given her polypharmacy. The risks, benefits, limitations, alternatives and imponderables have been reviewed with the patient. Questions have been answered. All parties are agreeable.  Further recommendations to follow.     Notice: This dictation was prepared with Dragon dictation along with smaller phrase technology. Any transcriptional errors that result from this process are unintentional and may not be corrected upon review.

## 2018-11-21 NOTE — Patient Instructions (Signed)
Schedule a screening colonoscopy  -  Average risk  -   Propofol  Further recommendations to follow

## 2018-12-06 ENCOUNTER — Telehealth: Payer: Self-pay | Admitting: Internal Medicine

## 2018-12-06 NOTE — Telephone Encounter (Signed)
Called patient to disregard the message

## 2018-12-06 NOTE — Telephone Encounter (Signed)
Pt said she was returning a call about her procedure in January. MB said it wasn't her. I told the patient that I would ask the others when they were available if they had called. I didn't see a phone note. She asked to call her home number [phone]

## 2018-12-06 NOTE — Telephone Encounter (Signed)
Noted  

## 2018-12-07 DIAGNOSIS — Z23 Encounter for immunization: Secondary | ICD-10-CM | POA: Diagnosis not present

## 2018-12-18 ENCOUNTER — Ambulatory Visit (INDEPENDENT_AMBULATORY_CARE_PROVIDER_SITE_OTHER): Payer: Medicare Other | Admitting: Family Medicine

## 2018-12-18 ENCOUNTER — Encounter: Payer: Self-pay | Admitting: Family Medicine

## 2018-12-18 ENCOUNTER — Other Ambulatory Visit: Payer: Self-pay

## 2018-12-18 VITALS — BP 110/70 | HR 80 | Temp 96.7°F | Resp 15 | Ht 62.0 in | Wt 142.0 lb

## 2018-12-18 DIAGNOSIS — Z13220 Encounter for screening for lipoid disorders: Secondary | ICD-10-CM

## 2018-12-18 DIAGNOSIS — L678 Other hair color and hair shaft abnormalities: Secondary | ICD-10-CM | POA: Diagnosis not present

## 2018-12-18 DIAGNOSIS — F5101 Primary insomnia: Secondary | ICD-10-CM

## 2018-12-18 DIAGNOSIS — R569 Unspecified convulsions: Secondary | ICD-10-CM

## 2018-12-18 DIAGNOSIS — E559 Vitamin D deficiency, unspecified: Secondary | ICD-10-CM | POA: Diagnosis not present

## 2018-12-18 DIAGNOSIS — Z780 Asymptomatic menopausal state: Secondary | ICD-10-CM

## 2018-12-18 DIAGNOSIS — F341 Dysthymic disorder: Secondary | ICD-10-CM | POA: Diagnosis not present

## 2018-12-18 MED ORDER — CLONAZEPAM 1 MG PO TABS: 1.0000 mg | ORAL_TABLET | Freq: Two times a day (BID) | ORAL | 5 refills | Status: DC | PRN

## 2018-12-18 NOTE — Assessment & Plan Note (Signed)
Controlled, no change in medication Drug levels to be o btained

## 2018-12-18 NOTE — Assessment & Plan Note (Signed)
Controlled, no change in medication  

## 2018-12-18 NOTE — Patient Instructions (Addendum)
Annual physical exam I office with MD in 5 months, ca;ll if you need me before   Please schedule bone density test at checkout   Please get shingrix " 2 first week in December We will enter flu vaccine for 12/09/2018 ( pharmacy)   Fasting cBC, lipid , TSH and vit D phenobarb level this week please

## 2018-12-18 NOTE — Assessment & Plan Note (Signed)
Controlled, no change in medication ?Sleep hygiene reviewed  ?Prescription sent for  medication needed. ? ?

## 2018-12-18 NOTE — Assessment & Plan Note (Signed)
Updated lab needed at/ before next visit.   

## 2018-12-18 NOTE — Progress Notes (Signed)
   Lauralei Troughton     MRN: [id number]      DOB: Feb 20, 1960   HPI Ms. Cegielski is here for follow up and re-evaluation of chronic medical conditions, medication management and review of any available recent lab and radiology data.  Preventive health is updated, specifically  Cancer screening and Immunization.   cedures which the PT has had in the interim are  addressed. The PT denies any adverse reactions to current medications since the last visit.  There are no new concerns.  There are no specific complaints  Has had no seizures since last visit  ROS Denies recent fever or chills. Denies sinus pressure, nasal congestion, ear pain or sore throat. Denies chest congestion, productive cough or wheezing. Denies chest pains, palpitations and leg swelling Denies abdominal pain, nausea, vomiting,diarrhea or constipation.   Denies dysuria, frequency, hesitancy or incontinence. Denies joint pain, swelling and limitation in mobility. Denies headaches, seizures, numbness, or tingling. Denies depression, anxiety or insomnia. Denies skin break down or rash.   PE  BP 110/70   Pulse 80   Temp (!) 96.7 F (35.9 C) (Temporal)   Resp 15   Ht 5\' 2"  (1.575 m)   Wt 142 lb (64.4 kg)   SpO2 96%   BMI 25.97 kg/m   Patient alert and oriented and in no cardiopulmonary distress.  HEENT: No facial asymmetry, EOMI,     Neck supple .  Chest: Clear to auscultation bilaterally.  CVS: S1, S2 no murmurs, no S3.Regular rate.  ABD: Soft non tender.   Ext: No edema  MS: Adequate ROM spine, shoulders, hips and knees.  Skin: Intact, no ulcerations or rash noted.  Psych: Good eye contact, normal affect. Memory intact not anxious or depressed appearing.  CNS: CN 2-12 intact, power,  normal throughout.no focal deficits noted.   Assessment & Plan  Convulsions Controlled, no change in medication Drug levels to be o btained  Insomnia Controlled, no change in medication Sleep hygiene reviewed  Prescription sent for  medication needed.   ANXIETY DEPRESSION Controlled, no change in medication   Vitamin D deficiency Updated lab needed at/ before next visit.

## 2018-12-25 ENCOUNTER — Other Ambulatory Visit (HOSPITAL_COMMUNITY): Payer: Medicare Other

## 2019-01-05 ENCOUNTER — Other Ambulatory Visit: Payer: Self-pay

## 2019-01-05 ENCOUNTER — Ambulatory Visit (HOSPITAL_COMMUNITY)
Admission: RE | Admit: 2019-01-05 | Discharge: 2019-01-05 | Disposition: A | Discharge status: Home / Self Care | Payer: Medicare Other | Source: Ambulatory Visit | Attending: Family Medicine | Admitting: Family Medicine

## 2019-01-05 DIAGNOSIS — Z780 Asymptomatic menopausal state: Secondary | ICD-10-CM | POA: Diagnosis not present

## 2019-01-05 DIAGNOSIS — M810 Age-related osteoporosis without current pathological fracture: Secondary | ICD-10-CM | POA: Diagnosis not present

## 2019-01-09 ENCOUNTER — Other Ambulatory Visit: Payer: Self-pay | Admitting: Family Medicine

## 2019-01-09 MED ORDER — ALENDRONATE SODIUM 70 MG PO TABS: 70.0000 mg | ORAL_TABLET | ORAL | 11 refills | Status: DC

## 2019-02-09 NOTE — Patient Instructions (Signed)
Sandra Riley  02/09/2019     @PREFPERIOPPHARMACY @   Your procedure is scheduled on  02/15/2019 .  Report to [facility] at  1300 (1:00)  P.M.  Call this number if you have problems the morning of surgery:  [phone]   Remember:  Follow the diet and prep instructions given to you by Dr Roseanne Kaufman office.                    Take these medicines the morning of surgery with A SIP OF WATER  Clonazepam, keppra, phenobarbitol.    Do not wear jewelry, make-up or nail polish.  Do not wear lotions, powders, or perfumes. Please wear deodorant and brush your teeth.  Do not shave 48 hours prior to surgery.  Men may shave face and neck.  Do not bring valuables to the hospital.  [facility] is not responsible for any belongings or valuables.  Contacts, dentures or bridgework may not be worn into surgery.  Leave your suitcase in the car.  After surgery it may be brought to your room.  For patients admitted to the hospital, discharge time will be determined by your treatment team.  Patients discharged the day of surgery will not be allowed to drive home.   Name and phone number of your driver:   family Special instructions:  None  Please read over the following fact sheets that you were given. Anesthesia Post-op Instructions and Care and Recovery After Surgery       Colonoscopy, Adult, Care After This sheet gives you information about how to care for yourself after your procedure. Your health care provider may also give you more specific instructions. If you have problems or questions, contact your health care provider. What can I expect after the procedure? After the procedure, it is common to have:  A small amount of blood in your stool for 24 hours after the procedure.  Some gas.  Mild cramping or bloating of your abdomen. Follow these instructions at home: Eating and drinking   Drink enough fluid to keep your urine pale yellow.  Follow instructions from your  health care provider about eating or drinking restrictions.  Resume your normal diet as instructed by your health care provider. Avoid heavy or fried foods that are hard to digest. Activity  Rest as told by your health care provider.  Avoid sitting for a long time without moving. Get up to take short walks every 1-2 hours. This is important to improve blood flow and breathing. Ask for help if you feel weak or unsteady.  Return to your normal activities as told by your health care provider. Ask your health care provider what activities are safe for you. Managing cramping and bloating   Try walking around when you have cramps or feel bloated.  Apply heat to your abdomen as told by your health care provider. Use the heat source that your health care provider recommends, such as a moist heat pack or a heating pad. ? Place a towel between your skin and the heat source. ? Leave the heat on for 20-30 minutes. ? Remove the heat if your skin turns bright red. This is especially important if you are unable to feel pain, heat, or cold. You may have a greater risk of getting burned. General instructions  For the first 24 hours after the procedure: ? Do not drive or use machinery. ? Do not sign important documents. ? Do not drink  alcohol. ? Do your regular daily activities at a slower pace than normal. ? Eat soft foods that are easy to digest.  Take over-the-counter and prescription medicines only as told by your health care provider.  Keep all follow-up visits as told by your health care provider. This is important. Contact a health care provider if:  You have blood in your stool 2-3 days after the procedure. Get help right away if you have:  More than a small spotting of blood in your stool.  Large blood clots in your stool.  Swelling of your abdomen.  Nausea or vomiting.  A fever.  Increasing pain in your abdomen that is not relieved with medicine. Summary  After the procedure,  it is common to have a small amount of blood in your stool. You may also have mild cramping and bloating of your abdomen.  For the first 24 hours after the procedure, do not drive or use machinery, sign important documents, or drink alcohol.  Get help right away if you have a lot of blood in your stool, nausea or vomiting, a fever, or increased pain in your abdomen. This information is not intended to replace advice given to you by your health care provider. Make sure you discuss any questions you have with your health care provider. Document Revised: 08/14/2018 Document Reviewed: 08/14/2018 Elsevier Patient Education  [place] After These instructions provide you with information about caring for yourself after your procedure. Your health care provider may also give you more specific instructions. Your treatment has been planned according to current medical practices, but problems sometimes occur. Call your health care provider if you have any problems or questions after your procedure. What can I expect after the procedure? After your procedure, you may:  Feel sleepy for several hours.  Feel clumsy and have poor balance for several hours.  Feel forgetful about what happened after the procedure.  Have poor judgment for several hours.  Feel nauseous or vomit.  Have a sore throat if you had a breathing tube during the procedure. Follow these instructions at home: For at least 24 hours after the procedure:      Have a responsible adult stay with you. It is important to have someone help care for you until you are awake and alert.  Rest as needed.  Do not: ? Participate in activities in which you could fall or become injured. ? Drive. ? Use heavy machinery. ? Drink alcohol. ? Take sleeping pills or medicines that cause drowsiness. ? Make important decisions or sign legal documents. ? Take care of children on your own. Eating and  drinking  Follow the diet that is recommended by your health care provider.  If you vomit, drink water, juice, or soup when you can drink without vomiting.  Make sure you have little or no nausea before eating solid foods. General instructions  Take over-the-counter and prescription medicines only as told by your health care provider.  If you have sleep apnea, surgery and certain medicines can increase your risk for breathing problems. Follow instructions from your health care provider about wearing your sleep device: ? Anytime you are sleeping, including during daytime naps. ? While taking prescription pain medicines, sleeping medicines, or medicines that make you drowsy.  If you smoke, do not smoke without supervision.  Keep all follow-up visits as told by your health care provider. This is important. Contact a health care provider if:  You keep feeling nauseous or  you keep vomiting.  You feel light-headed.  You develop a rash.  You have a fever. Get help right away if:  You have trouble breathing. Summary  For several hours after your procedure, you may feel sleepy and have poor judgment.  Have a responsible adult stay with you for at least 24 hours or until you are awake and alert. This information is not intended to replace advice given to you by your health care provider. Make sure you discuss any questions you have with your health care provider. Document Revised: 04/18/2017 Document Reviewed: 05/11/2015 Elsevier Patient Education  [place].

## 2019-02-13 ENCOUNTER — Telehealth: Payer: Self-pay | Admitting: *Deleted

## 2019-02-13 ENCOUNTER — Other Ambulatory Visit: Payer: Self-pay

## 2019-02-13 ENCOUNTER — Other Ambulatory Visit (HOSPITAL_COMMUNITY)
Admission: RE | Admit: 2019-02-13 | Discharge: 2019-02-13 | Disposition: A | Discharge status: Home / Self Care | Payer: 59 | Source: Ambulatory Visit | Attending: Internal Medicine | Admitting: Internal Medicine

## 2019-02-13 ENCOUNTER — Encounter (HOSPITAL_COMMUNITY)
Admission: RE | Admit: 2019-02-13 | Discharge: 2019-02-13 | Disposition: A | Discharge status: Home / Self Care | Payer: 59 | Source: Ambulatory Visit | Attending: Internal Medicine | Admitting: Internal Medicine

## 2019-02-13 DIAGNOSIS — Z20822 Contact with and (suspected) exposure to covid-19: Secondary | ICD-10-CM | POA: Insufficient documentation

## 2019-02-13 DIAGNOSIS — Z01812 Encounter for preprocedural laboratory examination: Secondary | ICD-10-CM | POA: Diagnosis present

## 2019-02-13 LAB — SARS CORONAVIRUS 2 (TAT 6-24 HRS): SARS Coronavirus 2: NEGATIVE

## 2019-02-13 NOTE — Telephone Encounter (Signed)
Called patient. She states she went for her covid test. I advised her she was suppose to also go for her PAT. She is going to call endo to get this rescheduled.

## 2019-02-13 NOTE — Telephone Encounter (Signed)
-----   Message from Encarnacion Chu, RN sent at 02/13/2019 12:42 PM EST ----- Regarding: No show Sandra Riley did not show for her PAT today.

## 2019-02-14 ENCOUNTER — Other Ambulatory Visit: Payer: Self-pay

## 2019-02-14 ENCOUNTER — Encounter (HOSPITAL_COMMUNITY): Payer: Self-pay

## 2019-02-14 ENCOUNTER — Encounter (HOSPITAL_COMMUNITY)
Admission: RE | Admit: 2019-02-14 | Discharge: 2019-02-14 | Disposition: A | Discharge status: Home / Self Care | Payer: 59 | Source: Ambulatory Visit | Attending: Internal Medicine | Admitting: Internal Medicine

## 2019-02-14 DIAGNOSIS — Z01812 Encounter for preprocedural laboratory examination: Secondary | ICD-10-CM | POA: Insufficient documentation

## 2019-02-14 LAB — BASIC METABOLIC PANEL
Anion gap: 8 (ref 5–15)
BUN: <5 mg/dL — ABNORMAL LOW (ref 6–20)
CO2: 27 mmol/L (ref 22–32)
Calcium: 9.3 mg/dL (ref 8.9–10.3)
Chloride: 98 mmol/L (ref 98–111)
Creatinine, Ser: 0.43 mg/dL — ABNORMAL LOW (ref 0.44–1.00)
GFR calc Af Amer: >60 mL/min (ref >60)
GFR calc non Af Amer: >60 mL/min (ref >60)
Glucose, Bld: 101 mg/dL — ABNORMAL HIGH (ref 70–99)
Potassium: 3.8 mmol/L (ref 3.5–5.1)
Sodium: 133 mmol/L — ABNORMAL LOW (ref 135–145)

## 2019-02-15 ENCOUNTER — Ambulatory Visit (HOSPITAL_COMMUNITY): Payer: 59 | Admitting: Anesthesiology

## 2019-02-15 ENCOUNTER — Encounter (HOSPITAL_COMMUNITY)
Admission: RE | Disposition: A | Discharge status: Home / Self Care | Payer: Self-pay | Source: Home / Self Care | Attending: Internal Medicine

## 2019-02-15 ENCOUNTER — Ambulatory Visit (HOSPITAL_COMMUNITY)
Admission: RE | Admit: 2019-02-15 | Discharge: 2019-02-15 | Disposition: A | Discharge status: Home / Self Care | Payer: 59 | Attending: Internal Medicine | Admitting: Internal Medicine

## 2019-02-15 ENCOUNTER — Encounter (HOSPITAL_COMMUNITY): Payer: Self-pay | Admitting: Internal Medicine

## 2019-02-15 DIAGNOSIS — G40909 Epilepsy, unspecified, not intractable, without status epilepticus: Secondary | ICD-10-CM | POA: Diagnosis not present

## 2019-02-15 DIAGNOSIS — F209 Schizophrenia, unspecified: Secondary | ICD-10-CM | POA: Insufficient documentation

## 2019-02-15 DIAGNOSIS — Z1211 Encounter for screening for malignant neoplasm of colon: Secondary | ICD-10-CM | POA: Insufficient documentation

## 2019-02-15 DIAGNOSIS — Z7982 Long term (current) use of aspirin: Secondary | ICD-10-CM | POA: Diagnosis not present

## 2019-02-15 DIAGNOSIS — K640 First degree hemorrhoids: Secondary | ICD-10-CM | POA: Insufficient documentation

## 2019-02-15 DIAGNOSIS — K5730 Diverticulosis of large intestine without perforation or abscess without bleeding: Secondary | ICD-10-CM | POA: Diagnosis not present

## 2019-02-15 DIAGNOSIS — K635 Polyp of colon: Secondary | ICD-10-CM

## 2019-02-15 DIAGNOSIS — F329 Major depressive disorder, single episode, unspecified: Secondary | ICD-10-CM | POA: Insufficient documentation

## 2019-02-15 DIAGNOSIS — D123 Benign neoplasm of transverse colon: Secondary | ICD-10-CM | POA: Diagnosis not present

## 2019-02-15 DIAGNOSIS — Z86010 Personal history of colonic polyps: Secondary | ICD-10-CM | POA: Diagnosis not present

## 2019-02-15 DIAGNOSIS — F411 Generalized anxiety disorder: Secondary | ICD-10-CM | POA: Insufficient documentation

## 2019-02-15 DIAGNOSIS — Z79899 Other long term (current) drug therapy: Secondary | ICD-10-CM | POA: Insufficient documentation

## 2019-02-15 HISTORY — PX: POLYPECTOMY: SHX5525

## 2019-02-15 HISTORY — PX: COLONOSCOPY WITH PROPOFOL: SHX5780

## 2019-02-15 SURGERY — COLONOSCOPY WITH PROPOFOL
Anesthesia: General

## 2019-02-15 MED ORDER — PROPOFOL 10 MG/ML IV BOLUS
INTRAVENOUS | Status: AC
  Filled 2019-02-15: qty 20

## 2019-02-15 MED ORDER — PROMETHAZINE HCL 25 MG/ML IJ SOLN: 6.2500 mg | INTRAMUSCULAR | Status: DC | PRN

## 2019-02-15 MED ORDER — PROPOFOL 10 MG/ML IV BOLUS
INTRAVENOUS | Status: DC | PRN
  Administered 2019-02-15 (×3): 20 mg via INTRAVENOUS

## 2019-02-15 MED ORDER — HYDROMORPHONE HCL 1 MG/ML IJ SOLN: 0.2500 mg | INTRAMUSCULAR | Status: DC | PRN

## 2019-02-15 MED ORDER — CHLORHEXIDINE GLUCONATE CLOTH 2 % EX PADS: 6.0000 | MEDICATED_PAD | Freq: Once | CUTANEOUS | Status: DC

## 2019-02-15 MED ORDER — LIDOCAINE HCL (CARDIAC) PF 100 MG/5ML IV SOSY
PREFILLED_SYRINGE | INTRAVENOUS | Status: DC | PRN
  Administered 2019-02-15: 50 mg via INTRAVENOUS

## 2019-02-15 MED ORDER — STERILE WATER FOR IRRIGATION IR SOLN
Status: DC | PRN
  Administered 2019-02-15: 14:00:00 2.5 mL

## 2019-02-15 MED ORDER — MIDAZOLAM HCL 2 MG/2ML IJ SOLN: 0.5000 mg | Freq: Once | INTRAMUSCULAR | Status: DC | PRN

## 2019-02-15 MED ORDER — HYDROCODONE-ACETAMINOPHEN 7.5-325 MG PO TABS: 1.0000 | ORAL_TABLET | Freq: Once | ORAL | Status: DC | PRN

## 2019-02-15 MED ORDER — KETAMINE HCL 10 MG/ML IJ SOLN
INTRAMUSCULAR | Status: DC | PRN
  Administered 2019-02-15 (×2): 10 mg via INTRAVENOUS

## 2019-02-15 MED ORDER — LACTATED RINGERS IV SOLN: INTRAVENOUS | Status: DC

## 2019-02-15 MED ORDER — PROPOFOL 500 MG/50ML IV EMUL
INTRAVENOUS | Status: DC | PRN
  Administered 2019-02-15: 150 ug/kg/min via INTRAVENOUS

## 2019-02-15 MED ORDER — KETAMINE HCL 50 MG/5ML IJ SOSY
PREFILLED_SYRINGE | INTRAMUSCULAR | Status: AC
  Filled 2019-02-15: qty 5

## 2019-02-15 NOTE — Discharge Instructions (Signed)
Colonoscopy Discharge Instructions  Read the instructions outlined below and refer to this sheet in the next few weeks. These discharge instructions provide you with general information on caring for yourself after you leave the hospital. Your doctor may also give you specific instructions. While your treatment has been planned according to the most current medical practices available, unavoidable complications occasionally occur. If you have any problems or questions after discharge, call Dr. Gala Romney at [phone]. ACTIVITY  You may resume your regular activity, but move at a slower pace for the next 24 hours.   Take frequent rest periods for the next 24 hours.   Walking will help get rid of the air and reduce the bloated feeling in your belly (abdomen).   No driving for 24 hours (because of the medicine (anesthesia) used during the test).    Do not sign any important legal documents or operate any machinery for 24 hours (because of the anesthesia used during the test).  NUTRITION  Drink plenty of fluids.   You may resume your normal diet as instructed by your doctor.   Begin with a light meal and progress to your normal diet. Heavy or fried foods are harder to digest and may make you feel sick to your stomach (nauseated).   Avoid alcoholic beverages for 24 hours or as instructed.  MEDICATIONS  You may resume your normal medications unless your doctor tells you otherwise.  WHAT YOU CAN EXPECT TODAY  Some feelings of bloating in the abdomen.   Passage of more gas than usual.   Spotting of blood in your stool or on the toilet paper.  IF YOU HAD POLYPS REMOVED DURING THE COLONOSCOPY:  No aspirin products for 7 days or as instructed.   No alcohol for 7 days or as instructed.   Eat a soft diet for the next 24 hours.  FINDING OUT THE RESULTS OF YOUR TEST Not all test results are available during your visit. If your test results are not back during the visit, make an appointment  with your caregiver to find out the results. Do not assume everything is normal if you have not heard from your caregiver or the medical facility. It is important for you to follow up on all of your test results.  SEEK IMMEDIATE MEDICAL ATTENTION IF:  You have more than a spotting of blood in your stool.   Your belly is swollen (abdominal distention).   You are nauseated or vomiting.   You have a temperature over 101.   You have abdominal pain or discomfort that is severe or gets worse throughout the day.   Colon polyp and diverticulosis information provided  Further recommendations to follow pending review of pathology report  At patient request, I called Coralyn Mark, husband, at [phone] and reviewed results       Colon Polyps  Polyps are tissue growths inside the body. Polyps can grow in many places, including the large intestine (colon). A polyp may be a round bump or a mushroom-shaped growth. You could have one polyp or several. Most colon polyps are noncancerous (benign). However, some colon polyps can become cancerous over time. Finding and removing the polyps early can help prevent this. What are the causes? The exact cause of colon polyps is not known. What increases the risk? You are more likely to develop this condition if you:  Have a family history of colon cancer or colon polyps.  Are older than 74 or older than 45 if you are African American.  Have inflammatory bowel disease, such as ulcerative colitis or Crohn's disease.  Have certain hereditary conditions, such as: ? Familial adenomatous polyposis. ? Lynch syndrome. ? Turcot syndrome. ? Peutz-Jeghers syndrome.  Are overweight.  Smoke cigarettes.  Do not get enough exercise.  Drink too much alcohol.  Eat a diet that is high in fat and red meat and low in fiber.  Had childhood cancer that was treated with abdominal radiation. What are the signs or symptoms? Most polyps do not cause symptoms. If you  have symptoms, they may include:  Blood coming from your rectum when having a bowel movement.  Blood in your stool. The stool may look dark red or black.  Abdominal pain.  A change in bowel habits, such as constipation or diarrhea. How is this diagnosed? This condition is diagnosed with a colonoscopy. This is a procedure in which a lighted, flexible scope is inserted into the anus and then passed into the colon to examine the area. Polyps are sometimes found when a colonoscopy is done as part of routine cancer screening tests. How is this treated? Treatment for this condition involves removing any polyps that are found. Most polyps can be removed during a colonoscopy. Those polyps will then be tested for cancer. Additional treatment may be needed depending on the results of testing. Follow these instructions at home: Lifestyle  Maintain a healthy weight, or lose weight if recommended by your health care provider.  Exercise every day or as told by your health care provider.  Do not use any products that contain nicotine or tobacco, such as cigarettes and e-cigarettes. If you need help quitting, ask your health care provider.  If you drink alcohol, limit how much you have: ? 0-1 drink a day for women. ? 0-2 drinks a day for men.  Be aware of how much alcohol is in your drink. In the [place] one drink equals one 12 oz bottle of beer (355 mL), one 5 oz glass of wine (148 mL), or one 1 oz shot of hard liquor (44 mL). Eating and drinking   Eat foods that are high in fiber, such as fruits, vegetables, and whole grains.  Eat foods that are high in calcium and vitamin D, such as milk, cheese, yogurt, eggs, liver, fish, and broccoli.  Limit foods that are high in fat, such as fried foods and desserts.  Limit the amount of red meat and processed meat you eat, such as hot dogs, sausage, bacon, and lunch meats. General instructions  Keep all follow-up visits as told by your health care  provider. This is important. ? This includes having regularly scheduled colonoscopies. ? Talk to your health care provider about when you need a colonoscopy. Contact a health care provider if:  You have new or worsening bleeding during a bowel movement.  You have new or increased blood in your stool.  You have a change in bowel habits.  You lose weight for no known reason. Summary  Polyps are tissue growths inside the body. Polyps can grow in many places, including the colon.  Most colon polyps are noncancerous (benign), but some can become cancerous over time.  This condition is diagnosed with a colonoscopy.  Treatment for this condition involves removing any polyps that are found. Most polyps can be removed during a colonoscopy. This information is not intended to replace advice given to you by your health care provider. Make sure you discuss any questions you have with your health care provider. Document Revised: 05/05/2017  Document Reviewed: 05/05/2017 Elsevier Patient Education  [employer].     Diverticulosis  Diverticulosis is a condition that develops when small pouches (diverticula) form in the wall of the large intestine (colon). The colon is where water is absorbed and stool (feces) is formed. The pouches form when the inside layer of the colon pushes through weak spots in the outer layers of the colon. You may have a few pouches or many of them. The pouches usually do not cause problems unless they become inflamed or infected. When this happens, the condition is called diverticulitis. What are the causes? The cause of this condition is not known. What increases the risk? The following factors may make you more likely to develop this condition:  Being older than age 41. Your risk for this condition increases with age. Diverticulosis is rare among people younger than age 39. By age 71, many people have it.  Eating a low-fiber diet.  Having frequent  constipation.  Being overweight.  Not getting enough exercise.  Smoking.  Taking over-the-counter pain medicines, like aspirin and ibuprofen.  Having a family history of diverticulosis. What are the signs or symptoms? In most people, there are no symptoms of this condition. If you do have symptoms, they may include:  Bloating.  Cramps in the abdomen.  Constipation or diarrhea.  Pain in the lower left side of the abdomen. How is this diagnosed? Because diverticulosis usually has no symptoms, it is most often diagnosed during an exam for other colon problems. The condition may be diagnosed by:  Using a flexible scope to examine the colon (colonoscopy).  Taking an X-ray of the colon after dye has been put into the colon (barium enema).  Having a CT scan. How is this treated? You may not need treatment for this condition. Your health care provider may recommend treatment to prevent problems. You may need treatment if you have symptoms or if you previously had diverticulitis. Treatment may include:  Eating a high-fiber diet.  Taking a fiber supplement.  Taking a live bacteria supplement (probiotic).  Taking medicine to relax your colon. Follow these instructions at home: Medicines  Take over-the-counter and prescription medicines only as told by your health care provider.  If told by your health care provider, take a fiber supplement or probiotic. Constipation prevention Your condition may cause constipation. To prevent or treat constipation, you may need to:  Drink enough fluid to keep your urine pale yellow.  Take over-the-counter or prescription medicines.  Eat foods that are high in fiber, such as beans, whole grains, and fresh fruits and vegetables.  Limit foods that are high in fat and processed sugars, such as fried or sweet foods.  General instructions  Try not to strain when you have a bowel movement.  Keep all follow-up visits as told by your health  care provider. This is important. Contact a health care provider if you:  Have pain in your abdomen.  Have bloating.  Have cramps.  Have not had a bowel movement in 3 days. Get help right away if:  Your pain gets worse.  Your bloating becomes very bad.  You have a fever or chills, and your symptoms suddenly get worse.  You vomit.  You have bowel movements that are bloody or black.  You have bleeding from your rectum. Summary  Diverticulosis is a condition that develops when small pouches (diverticula) form in the wall of the large intestine (colon).  You may have a few pouches or many  of them.  This condition is most often diagnosed during an exam for other colon problems.  Treatment may include increasing the fiber in your diet, taking supplements, or taking medicines. This information is not intended to replace advice given to you by your health care provider. Make sure you discuss any questions you have with your health care provider. Document Revised: 08/17/2018 Document Reviewed: 08/17/2018 Elsevier Patient Education  [place].

## 2019-02-15 NOTE — Op Note (Signed)
[facility] Patient Name: Sandra Riley Procedure Date: 02/15/2019 2:00 PM MRN: [id number] Date of Birth: 20-Jan-1961 Attending MD: Norvel Richards , MD CSN: [id number] Age: 59 Admit Type: Outpatient Procedure:                Colonoscopy Indications:              High risk colon cancer surveillance: Personal                            history of colonic polyps Providers:                Norvel Richards, MD, Lurline Del, RN, Aram Candela Referring MD:              Medicines:                Propofol per Anesthesia Complications:            No immediate complications. Estimated Blood Loss:     Estimated blood loss was minimal. Procedure:                Pre-Anesthesia Assessment:                           - Prior to the procedure, a History and Physical                            was performed, and patient medications and                            allergies were reviewed. The patient's tolerance of                            previous anesthesia was also reviewed. The risks                            and benefits of the procedure and the sedation                            options and risks were discussed with the patient.                            All questions were answered, and informed consent                            was obtained. Prior Anticoagulants: The patient has                            taken no previous anticoagulant or antiplatelet                            agents. ASA Grade Assessment: II - A patient with  mild systemic disease. After reviewing the risks                            and benefits, the patient was deemed in                            satisfactory condition to undergo the procedure.                           After obtaining informed consent, the colonoscope                            was passed under direct vision. Throughout the                            procedure, the patient's blood  pressure, pulse, and                            oxygen saturations were monitored continuously. The                            [id number]) scope was introduced through                            the anus and advanced to the the cecum, identified                            by appendiceal orifice and ileocecal valve. The                            colonoscopy was performed without difficulty. The                            patient tolerated the procedure well. The quality                            of the bowel preparation was adequate. Scope In: 2:35:53 PM Scope Out: 2:52:53 PM Scope Withdrawal Time: 0 hours 12 minutes 55 seconds  Total Procedure Duration: 0 hours 17 minutes 0 seconds  Findings:      The perianal and digital rectal examinations were normal.      Scattered small-mouthed diverticula were found in the entire colon.      A 5 mm polyp was found in the hepatic flexure. The polyp was sessile.       The polyp was removed with a cold snare. Resection and retrieval were       complete. Estimated blood loss was minimal.      Non-bleeding internal hemorrhoids were found during retroflexion. The       hemorrhoids were mild, small and Grade I (internal hemorrhoids that do       not prolapse). Impression:               - Diverticulosis in the entire examined colon.                           -  One 5 mm polyp at the hepatic flexure, removed                            with a cold snare. Resected and retrieved.                           - Non-bleeding internal hemorrhoids. Moderate Sedation:      Moderate (conscious) sedation was personally administered by an       anesthesia professional. The following parameters were monitored: oxygen       saturation, heart rate, blood pressure, respiratory rate, EKG, adequacy       of pulmonary ventilation, and response to care. Recommendation:           - Repeat colonoscopy date to be determined after                            pending  pathology results are reviewed for                            surveillance based on pathology results.                           - Return to GI office (date not yet determined). Procedure Code(s):        --- Professional ---                           [id number], Colonoscopy, flexible; with removal of                            tumor(s), polyp(s), or other lesion(s) by snare                            technique Diagnosis Code(s):        --- Professional ---                           Z86.010, Personal history of colonic polyps                           K64.0, First degree hemorrhoids                           K63.5, Polyp of colon                           K57.30, Diverticulosis of large intestine without                            perforation or abscess without bleeding CPT copyright 2019 American Medical Association. All rights reserved. The codes documented in this report are preliminary and upon coder review may  be revised to meet current compliance requirements. Sandra Riley. Sandra Rapozo, MD Norvel Richards, MD 02/15/2019 3:01:44 PM This report has been signed electronically. Number of Addenda: 0

## 2019-02-15 NOTE — Anesthesia Preprocedure Evaluation (Signed)
Anesthesia Evaluation  Patient identified by MRN, date of birth, ID band Patient awake    Reviewed: Allergy & Precautions, NPO status , Patient's Chart, lab work & pertinent test results  Airway Mallampati: I  TM Distance: >3 FB Neck ROM: Full    Dental no notable dental hx. 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 Reports last week her husband prayed her out of a Sz On meds    GI/Hepatic negative GI ROS, Neg liver ROS,   Endo/Other  negative endocrine ROS  Renal/GU negative Renal ROS  negative genitourinary   Musculoskeletal negative musculoskeletal ROS (+)   Abdominal   Peds negative pediatric ROS (+)  Hematology negative hematology ROS (+)   Anesthesia Other Findings   Reproductive/Obstetrics negative OB ROS                             Anesthesia Physical Anesthesia Plan  ASA: II  Anesthesia Plan: General   Post-op Pain Management:    Induction: Intravenous  PONV Risk Score and Plan: 3 and Propofol infusion, TIVA, Treatment may vary due to age or medical condition and Ondansetron  Airway Management Planned: Simple Face Mask and Nasal Cannula  Additional Equipment:   Intra-op Plan:   Post-operative Plan: Extubation in OR  Informed Consent: I have reviewed the patients History and Physical, chart, labs and discussed the procedure including the risks, benefits and alternatives for the proposed anesthesia with the patient or authorized representative who has indicated his/her  understanding and acceptance.     Dental advisory given  Plan Discussed with: CRNA  Anesthesia Plan Comments: (Plan Full PPE use  Plan GA with GETA as needed d/w pt -WTP with same after Q&A)        Anesthesia Quick Evaluation

## 2019-02-15 NOTE — H&P (Signed)
@LOGO @   Primary Care Physician:  Fayrene Helper, MD Primary Gastroenterologist:  Dr. Gala Romney  Pre-Procedure History & Physical: HPI:  Sandra Riley is a 59 y.o. female here for surveillance colonoscopy.  History of small adenoma removed from her colon 11 years ago.  No bowel symptoms currently.  Past Medical History:  Diagnosis Date  . Anxiety and depression   . GAD (generalized anxiety disorder)   . Insomnia   . Psychotic disorder ([place])   . Seizure disorder ([place])    stress related seizures. last one was a "while back"  . 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 ABDOMINAL HYSTERECTOMY    . CESAREAN SECTION    . CHOLECYSTECTOMY      Prior to Admission medications   Medication Sig Start Date End Date Taking? Authorizing Provider  acetaminophen (TYLENOL) 500 MG tablet Take 500 mg by mouth every 6 (six) hours as needed for moderate pain.    Yes [provider]  alendronate (FOSAMAX) 70 MG tablet Take 1 tablet (70 mg total) by mouth every 7 (seven) days. Take with a full glass of water on an empty stomach. 01/09/19  Yes Fayrene Helper, MD  aspirin EC 81 MG tablet Take 81 mg by mouth daily.   Yes [provider]  Calcium Carb-Cholecalciferol (CALCIUM 1000 + D) 1000-800 MG-UNIT TABS Take 1 tablet by mouth 3 (three) times daily.    Yes [provider]  clonazePAM (KLONOPIN) 1 MG tablet Take 1 tablet (1 mg total) by mouth 2 (two) times daily as needed for anxiety. 01/13/19  Yes Fayrene Helper, MD  levETIRAcetam (KEPPRA) 500 MG tablet Take 1,000 mg by mouth 2 (two) times daily. Two tablets twice daily 03/30/16  Yes Fayrene Helper, MD  MAGNESIUM PO Take 500 mg by mouth daily.    Yes [provider]  mirtazapine (REMERON) 30 MG tablet TAKE ONE TABLET BY MOUTH AT BEDTIME Patient taking differently: Take 30 mg by mouth at bedtime.  11/13/18  Yes Fayrene Helper, MD  Multiple Vitamin  (MULTIVITAMIN) tablet Take 1 tablet by mouth daily.   Yes [provider]  PHENobarbital (LUMINAL) 97.2 MG tablet Take 97.2 mg by mouth 2 (two) times daily.   Yes [provider]  polyethylene glycol-electrolytes (TRILYTE) 420 g solution Take 4,000 mLs by mouth as directed. 11/21/18  Yes Daneil Dolin, MD  Potassium 99 MG TABS Take 99 mg by mouth daily.    Yes [provider]    Allergies as of 11/21/2018 - Review Complete 11/21/2018  Allergen Reaction Noted  . Aspirin  02/21/2007  . Singulair [montelukast sodium] Other (See Comments) 03/30/2016    Family History  Problem Relation Age of Onset  . Breast cancer Mother 35  . Coronary artery disease Father   . Stroke Father 39  . Breast cancer Sister 44  . Breast cancer Sister   . Breast cancer Sister   . Diabetes Brother   . Heart attack Brother     Social History   Socioeconomic History  . Marital status: Married    Spouse name: Not on file  . Number of children: 3  . Years of education: Not on file  . Highest education level: 12th grade  Occupational History  . Not on file  Tobacco Use  . Smoking status: Never Smoker  . Smokeless tobacco: Never Used  Substance and Sexual Activity  . Alcohol use:  No  . Drug use: No  . Sexual activity: Yes    Birth control/protection: Surgical  Other Topics Concern  . Not on file  Social History Narrative  . Not on file   Social Determinants of Health   Financial Resource Strain:   . Difficulty of Paying Living Expenses: Not on file  Food Insecurity:   . Worried About Charity fundraiser in the Last Year: Not on file  . Ran Out of Food in the Last Year: Not on file  Transportation Needs:   . Lack of Transportation (Medical): Not on file  . Lack of Transportation (Non-Medical): Not on file  Physical Activity:   . Days of Exercise per Week: Not on file  . Minutes of Exercise per Session: Not on file  Stress:   . Feeling of Stress : Not on file   Social Connections:   . Frequency of Communication with Friends and Family: Not on file  . Frequency of Social Gatherings with Friends and Family: Not on file  . Attends Religious Services: Not on file  . Active Member of Clubs or Organizations: Not on file  . Attends Archivist Meetings: Not on file  . Marital Status: Not on file  Intimate Partner Violence:   . Fear of Current or Ex-Partner: Not on file  . Emotionally Abused: Not on file  . Physically Abused: Not on file  . Sexually Abused: Not on file    Review of Systems: See HPI, otherwise negative ROS  Physical Exam: BP (!) 162/84   Pulse 72   Temp 98 F (36.7 C) (Oral)   Resp 18   SpO2 95%  General:   Alert,  Well-developed, well-nourished, pleasant and cooperative in NAD Neck:  Supple; no masses or thyromegaly. No significant cervical adenopathy. Lungs:  Clear throughout to auscultation.   No wheezes, crackles, or rhonchi. No acute distress. Heart:  Regular rate and rhythm; no murmurs, clicks, rubs,  or gallops. Abdomen: Non-distended, normal bowel sounds.  Soft and nontender without appreciable mass or hepatosplenomegaly.  Pulses:  Normal pulses noted. Extremities:  Without clubbing or edema.  Impression/Plan: 59 year old lady here for surveillance colonoscopy.  History of colonic adenoma. The risks, benefits, limitations, alternatives and imponderables have been reviewed with the patient. Questions have been answered. All parties are agreeable.      Notice: This dictation was prepared with Dragon dictation along with smaller phrase technology. Any transcriptional errors that result from this process are unintentional and may not be corrected upon review.

## 2019-02-15 NOTE — Anesthesia Postprocedure Evaluation (Signed)
Anesthesia Post Note  Patient: Sandra Riley  Procedure(s) Performed: COLONOSCOPY WITH PROPOFOL (N/A ) POLYPECTOMY  Patient location during evaluation: PICU Anesthesia Type: MAC Level of consciousness: awake and alert Pain management: pain level controlled Vital Signs Assessment: post-procedure vital signs reviewed and stable Respiratory status: spontaneous breathing Cardiovascular status: stable Postop Assessment: no apparent nausea or vomiting Anesthetic complications: no     Last Vitals:  Vitals:   02/15/19 1330 02/15/19 1500  BP: (!) 162/84 (!) 146/76  Pulse: 72 62  Resp: 18 (!) 28  Temp: 36.7 C 36.8 C  SpO2: 95% 94%    Last Pain:  Vitals:   02/15/19 1500  TempSrc:   PainSc: 0-No pain                 Everette Rank

## 2019-02-15 NOTE — Transfer of Care (Signed)
Immediate Anesthesia Transfer of Care Note  Patient: Female Iafrate  Procedure(s) Performed: COLONOSCOPY WITH PROPOFOL (N/A ) POLYPECTOMY  Patient Location: PACU  Anesthesia Type:MAC  Level of Consciousness: awake, alert , oriented and patient cooperative  Airway & Oxygen Therapy: Patient Spontanous Breathing  Post-op Assessment: Report given to RN and Post -op Vital signs reviewed and stable  Post vital signs: Reviewed and stable  Last Vitals:  Vitals Value Taken Time  BP 146/76 02/15/19 1500  Temp    Pulse 66 02/15/19 1502  Resp 20 02/15/19 1502  SpO2 98 % 02/15/19 1502  Vitals shown include unvalidated device data.  Last Pain:  Vitals:   02/15/19 1330  TempSrc: Oral  PainSc: 0-No pain      Patients Stated Pain Goal: 6 (29/47/65 4650)  Complications: No apparent anesthesia complications

## 2019-02-19 ENCOUNTER — Encounter: Payer: Self-pay | Admitting: Internal Medicine

## 2019-02-19 LAB — SURGICAL PATHOLOGY

## 2019-04-23 ENCOUNTER — Other Ambulatory Visit: Payer: Self-pay | Admitting: Family Medicine

## 2019-04-27 ENCOUNTER — Encounter: Payer: Self-pay | Admitting: Family Medicine

## 2019-04-27 ENCOUNTER — Other Ambulatory Visit: Payer: Self-pay

## 2019-04-27 ENCOUNTER — Ambulatory Visit (INDEPENDENT_AMBULATORY_CARE_PROVIDER_SITE_OTHER): Payer: 59 | Admitting: Family Medicine

## 2019-04-27 VITALS — BP 174/82 | HR 60 | Temp 98.0°F | Ht 62.0 in | Wt 137.4 lb

## 2019-04-27 DIAGNOSIS — M810 Age-related osteoporosis without current pathological fracture: Secondary | ICD-10-CM

## 2019-04-27 DIAGNOSIS — Z09 Encounter for follow-up examination after completed treatment for conditions other than malignant neoplasm: Secondary | ICD-10-CM | POA: Diagnosis not present

## 2019-04-27 MED ORDER — ALENDRONATE SODIUM 35 MG PO TABS: 35.0000 mg | ORAL_TABLET | ORAL | 5 refills | Status: DC

## 2019-04-27 NOTE — Patient Instructions (Addendum)
I appreciate the opportunity to provide you with care for your health and wellness. Today we discussed: recent ED visit and BP   Follow up: 05/21/2019 (needs to be changed to me annual no pap)  Labs 1 week before next appt  Check BP at pharmacy over next week or two and call with results   Please continue to practice social distancing to keep you, your family, and our community safe.  If you must go out, please wear a mask and practice good handwashing.  It was a pleasure to see you and I look forward to continuing to work together on your health and well-being. Please do not hesitate to call the office if you need care or have questions about your care.  Have a wonderful day and week. With Gratitude, Cherly Beach, DNP, AGNP-BC

## 2019-04-27 NOTE — Progress Notes (Signed)
Subjective:  Patient ID: Sandra Riley, female    DOB: 06-23-1960  Age: 59 y.o. MRN: [id number]  CC:  Chief Complaint  Patient presents with  . Follow-up    ER visit [facility] 04/14/19 nausea vomiting hypertenison and hyponatremia has gotten better since then       HPI  HPI  Sandra Riley is a 59 year old female patient who presents today after going to [facility] for an emergency room visit secondary to having acute nausea and vomiting and was found to be hyponatremic.  She was provided with Zofran 4 mg tablets.  And a work note for 3 days.   Additionally she was noted to have elevated blood pressure.  She reports that she only has elevated blood pressure when she comes to the doctor's office.  Though she has never taken her blood pressure outside the doctor's office.  She is willing to check her blood pressure outside of the doctor's office and see if she has the same type of readings and is willing to take medication if she does get those readings back that are elevated.  She reports she is having trouble with Fosamax causing her to have bone pain in her back for like to weeks or more after taking it.  Reports she is not taking it since she got sick and went to the emergency room at [facility].  She is taking her calcium and vitamin D though.  Today patient denies signs and symptoms of COVID 19 infection including fever, chills, cough, shortness of breath, and headache. Past Medical, Surgical, Social History, Allergies, and Medications have been Reviewed.   Past Medical History:  Diagnosis Date  . Anxiety and depression   . GAD (generalized anxiety disorder)   . Insomnia   . Psychotic disorder ([place])   . Seizure disorder ([place])    stress related seizures. last one was a "while back"  . 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 acetaminophen (TYLENOL) 500 MG tablet Take 500 mg by mouth every 6 (six) hours as needed for  moderate pain.   Marland Kitchen aspirin EC 81 MG tablet Take 81 mg by mouth daily.  . Calcium Carb-Cholecalciferol (CALCIUM 1000 + D) 1000-800 MG-UNIT TABS Take 1 tablet by mouth 3 (three) times daily.   . clonazePAM (KLONOPIN) 1 MG tablet Take 1 tablet (1 mg total) by mouth 2 (two) times daily as needed for anxiety.  . levETIRAcetam (KEPPRA) 500 MG tablet Take 1,000 mg by mouth 2 (two) times daily. Two tablets twice daily  . MAGNESIUM PO Take 500 mg by mouth daily.   . mirtazapine (REMERON) 30 MG tablet TAKE ONE TABLET BY MOUTH AT BEDTIME  . Multiple Vitamin (MULTIVITAMIN) tablet Take 1 tablet by mouth daily.  Marland Kitchen PHENobarbital (LUMINAL) 97.2 MG tablet Take 97.2 mg by mouth 2 (two) times daily.  . Potassium 99 MG TABS Take 99 mg by mouth daily.     ROS:  Review of Systems  Constitutional: Negative.   HENT: Negative.   Eyes: Negative.   Respiratory: Negative.   Cardiovascular: Negative.   Gastrointestinal: Negative.   Genitourinary: Negative.   Musculoskeletal: Positive for back pain.  Skin: Negative.   Neurological: Negative.   Endo/Heme/Allergies: Negative.   Psychiatric/Behavioral: Negative.   All other systems reviewed and are negative.    Objective:   Today's Vitals: BP (!) 174/82 (BP Location: Right Arm, Patient Position: Sitting, Cuff Size: Normal)  Pulse 60   Temp 98 F (36.7 C) (Temporal)   Ht 5\' 2"  (1.575 m)   Wt 137 lb 6.4 oz (62.3 kg)   SpO2 90%   BMI 25.13 kg/m  Vitals with BMI 04/27/2019 02/15/2019 02/15/2019  Height 5\' 2"  - -  Weight 137 lbs 6 oz - -  BMI [id number] - -  Systolic [id number]  Diastolic 82 63 76  Pulse 60 63 62     Physical Exam Vitals and nursing note reviewed.  Constitutional:      Appearance: Normal appearance. She is well-developed and well-groomed. She is obese.  HENT:     Head: Normocephalic and atraumatic.     Right Ear: External ear normal.     Left Ear: External ear normal.     Mouth/Throat:     Comments: Mask in place Eyes:     General:         Right eye: No discharge.        Left eye: No discharge.     Conjunctiva/sclera: Conjunctivae normal.  Cardiovascular:     Rate and Rhythm: Normal rate and regular rhythm.     Pulses: Normal pulses.     Heart sounds: Murmur present.  Pulmonary:     Effort: Pulmonary effort is normal.     Breath sounds: Normal breath sounds.  Musculoskeletal:        General: Normal range of motion.     Cervical back: Normal range of motion and neck supple.  Skin:    General: Skin is warm.  Neurological:     General: No focal deficit present.     Mental Status: She is alert and oriented to person, place, and time.  Psychiatric:        Attention and Perception: Attention normal.        Mood and Affect: Mood normal.        Speech: Speech normal.        Behavior: Behavior normal. Behavior is cooperative.        Thought Content: Thought content normal.        Cognition and Memory: Cognition normal.        Judgment: Judgment normal.     Assessment   1. Encounter for examination following treatment at hospital   2. Osteoporosis without current pathological fracture, unspecified osteoporosis type     Tests ordered No orders of the defined types were placed in this encounter.    Plan: Please see assessment and plan per problem list above.   No orders of the defined types were placed in this encounter.   Patient to follow-up in as scheduled.  Perlie Mayo, NP

## 2019-04-27 NOTE — Assessment & Plan Note (Signed)
She is having trouble tolerating the Fosamax and has stopped taking it. I will look at reducing the dose to 35 mg weekly and see if that helps. She has follow up for annual in April. Will revisit then.

## 2019-05-14 ENCOUNTER — Other Ambulatory Visit (HOSPITAL_COMMUNITY): Payer: Self-pay | Admitting: Family Medicine

## 2019-05-14 DIAGNOSIS — Z1231 Encounter for screening mammogram for malignant neoplasm of breast: Secondary | ICD-10-CM

## 2019-05-16 LAB — CBC
HCT: 39.9 % (ref 35.0–45.0)
Hemoglobin: 13.4 g/dL (ref 11.7–15.5)
MCH: 32.5 pg (ref 27.0–33.0)
MCHC: 33.6 g/dL (ref 32.0–36.0)
MCV: 96.8 fL (ref 80.0–100.0)
MPV: 8.9 fL (ref 7.5–12.5)
Platelets: 381 10*3/uL (ref 140–400)
RBC: 4.12 10*6/uL (ref 3.80–5.10)
RDW: 11.8 % (ref 11.0–15.0)
WBC: 4.6 10*3/uL (ref 3.8–10.8)

## 2019-05-16 LAB — LIPID PANEL
Cholesterol: 168 mg/dL (ref <200)
HDL: 60 mg/dL (ref >=50)
LDL Cholesterol (Calc): 86 mg/dL (calc)
Non-HDL Cholesterol (Calc): 108 mg/dL (calc) (ref <130)
Total CHOL/HDL Ratio: 2.8 (calc) (ref <5.0)
Triglycerides: 123 mg/dL (ref <150)

## 2019-05-16 LAB — PHENYTOIN LEVEL, TOTAL: Phenytoin, Total: <2.5 mg/L — ABNORMAL LOW (ref 10.0–20.0)

## 2019-05-16 LAB — VITAMIN D 25 HYDROXY (VIT D DEFICIENCY, FRACTURES): Vit D, 25-Hydroxy: 67 ng/mL (ref 30–100)

## 2019-05-21 ENCOUNTER — Ambulatory Visit: Payer: 59 | Admitting: Family Medicine

## 2019-05-23 ENCOUNTER — Other Ambulatory Visit: Payer: Self-pay

## 2019-05-23 ENCOUNTER — Ambulatory Visit (INDEPENDENT_AMBULATORY_CARE_PROVIDER_SITE_OTHER): Payer: 59 | Admitting: Family Medicine

## 2019-05-23 ENCOUNTER — Encounter: Payer: Self-pay | Admitting: Family Medicine

## 2019-05-23 VITALS — BP 114/80 | HR 68 | Temp 97.1°F | Resp 16 | Ht 62.0 in | Wt 137.0 lb

## 2019-05-23 DIAGNOSIS — Z0001 Encounter for general adult medical examination with abnormal findings: Secondary | ICD-10-CM | POA: Diagnosis not present

## 2019-05-23 DIAGNOSIS — J301 Allergic rhinitis due to pollen: Secondary | ICD-10-CM | POA: Diagnosis not present

## 2019-05-23 DIAGNOSIS — E559 Vitamin D deficiency, unspecified: Secondary | ICD-10-CM

## 2019-05-23 DIAGNOSIS — R569 Unspecified convulsions: Secondary | ICD-10-CM

## 2019-05-23 DIAGNOSIS — F5101 Primary insomnia: Secondary | ICD-10-CM

## 2019-05-23 NOTE — Assessment & Plan Note (Signed)
Discussed monthly self breast exams and yearly mammograms; at least 30 minutes of aerobic activity at least 5 days/week and weight-bearing exercise 2x/week; proper sunscreen use reviewed; healthy diet, including goals of calcium and vitamin D intake and alcohol recommendations (less than or equal to 1 drink/day) reviewed; regular seatbelt use; changing batteries in smoke detectors.  Immunization recommendations discussed.  Colonoscopy recommendations reviewed.  

## 2019-05-23 NOTE — Assessment & Plan Note (Signed)
Reports seeing Neurology and taking medication as directed.

## 2019-05-23 NOTE — Assessment & Plan Note (Signed)
Controlled on mirtazapine. No change. Continue encouragement of sleep hygiene.

## 2019-05-23 NOTE — Assessment & Plan Note (Signed)
Continue OTC supplement

## 2019-05-23 NOTE — Patient Instructions (Addendum)
I appreciate the opportunity to provide you with care for your health and wellness. Today we discussed: overall health  Follow up: 6 months   Labs today (non fasting) No referrals  Have fun with the grand babies this weekend!  :)  Please continue to practice social distancing to keep you, your family, and our community safe.  If you must go out, please wear a mask and practice good handwashing.  It was a pleasure to see you and I look forward to continuing to work together on your health and well-being. Please do not hesitate to call the office if you need care or have questions about your care.  Have a wonderful day and week. With Gratitude, Cherly Beach, DNP, AGNP-BC  HEALTH MAINTENANCE RECOMMENDATIONS:  It is recommended that you get at least 30 minutes of aerobic exercise at least 5 days/week (for weight loss, you may need as much as 60-90 minutes). This can be any activity that gets your heart rate up. This can be divided in 10-15 minute intervals if needed, but try and build up your endurance at least once a week.  Weight bearing exercise is also recommended twice weekly.  Eat a healthy diet with lots of vegetables, fruits and fiber.  "Colorful" foods have a lot of vitamins (ie green vegetables, tomatoes, red peppers, etc).  Limit sweet tea, regular sodas and alcoholic beverages, all of which has a lot of calories and sugar.  Up to 1 alcoholic drink daily may be beneficial for women (unless trying to lose weight, watch sugars).  Drink a lot of water.  Calcium recommendations are 1200-1500 mg daily (1500 mg for postmenopausal women or women without ovaries), and vitamin D 1000 IU daily.  This should be obtained from diet and/or supplements (vitamins), and calcium should not be taken all at once, but in divided doses.  Monthly self breast exams and yearly mammograms for women over the age of 92 is recommended.  Sunscreen of at least SPF 30 should be used on all sun-exposed parts of  the skin when outside between the hours of 10 am and 4 pm (not just when at beach or pool, but even with exercise, golf, tennis, and yard work!)  Use a sunscreen that says "broad spectrum" so it covers both UVA and UVB rays, and make sure to reapply every 1-2 hours.  Remember to change the batteries in your smoke detectors when changing your clock times in the spring and fall.  Use your seat belt every time you are in a car, and please drive safely and not be distracted with cell phones and texting while driving.

## 2019-05-23 NOTE — Progress Notes (Signed)
Health Maintenance reviewed -  Immunization History  Administered Date(s) Administered  . H1N1 12/05/2007  . Influenza Split 12/15/2010, 11/23/2011, 12/02/2014  . Influenza Whole 12/20/2006, 12/10/2009  . Influenza,inj,Quad PF,6+ Mos 12/04/2012  . Pneumococcal Conjugate-13 03/12/2014  . Pneumococcal Polysaccharide-23 03/03/2009  . Td 02/01/2001  . Tdap 09/14/2010  . Zoster Recombinat (Shingrix) 09/27/2018, 01/17/2019   Last Pap smear: 07/2017 Last mammogram: 06/2018 Last colonoscopy: 02/2019 Last DEXA: n/a Dentist: does not go-dentures fit well  Ophtho: eye yearly-needs new glasses Exercise: walk daily; going to go back to [employer]: None  Other doctors caring for patient include:  Patient Care Team: Fayrene Helper, MD as PCP - General Phillips Odor, MD as Consulting Physician (Neurology)  End of Life Discussion:  Patient does not have a living will and medical power of attorney    Code Status: Not on file   Subjective:   HPI  Sandra Riley is a 59 y.o. female who presents for annual wellness visit and follow-up on chronic medical conditions.  She has the following concerns:  Reports sleeping well. Has been working Scientist, product/process development. Has dentures now-they fit well. Denies falls. No skin issues. Denies Bowel and bladder changes. Denies changes in memory.  Review Of Systems  Review of Systems  Constitutional: Negative.   HENT: Negative.   Eyes: Negative.   Respiratory: Negative.   Cardiovascular: Negative.   Gastrointestinal: Negative.   Endocrine: Negative.   Genitourinary: Negative.   Musculoskeletal: Negative.   Skin: Negative.   Allergic/Immunologic: Negative.   Neurological: Negative.   Hematological: Negative.   Psychiatric/Behavioral: Negative.   All other systems reviewed and are negative.   Objective:   PHYSICAL EXAM:  BP 114/80   Pulse 68   Temp (!) 97.1 F (36.2 C) (Temporal)   Resp 16   Ht 5\' 2"  (1.575 m)   Wt 137 lb  (62.1 kg)   SpO2 98%   BMI 25.06 kg/m    Physical Exam Vitals and nursing note reviewed.  Constitutional:      Appearance: Normal appearance. She is well-developed, well-groomed and overweight.  HENT:     Head: Normocephalic and atraumatic.     Right Ear: Hearing, tympanic membrane, ear canal and external ear normal.     Left Ear: Hearing, tympanic membrane, ear canal and external ear normal.     Ears:     Weber exam findings: does not lateralize.    Nose: Nose normal.     Mouth/Throat:     Lips: Pink.     Mouth: Mucous membranes are moist.     Dentition: Has dentures.     Tongue: No lesions.     Palate: No mass.     Pharynx: Oropharynx is clear.  Eyes:     General: Lids are normal.     Extraocular Movements: Extraocular movements intact.     Conjunctiva/sclera: Conjunctivae normal.     Pupils: Pupils are equal, round, and reactive to light.  Cardiovascular:     Rate and Rhythm: Normal rate and regular rhythm.     Pulses: Normal pulses.          Radial pulses are 2+ on the right side and 2+ on the left side.       Dorsalis pedis pulses are 2+ on the right side and 2+ on the left side.     Heart sounds: Normal heart sounds.  Pulmonary:     Effort: Pulmonary effort is normal.  Breath sounds: Normal breath sounds and air entry.  Abdominal:     General: Abdomen is flat. Bowel sounds are normal.     Palpations: Abdomen is soft.     Tenderness: There is no abdominal tenderness. There is no right CVA tenderness or left CVA tenderness.  Musculoskeletal:        General: Normal range of motion.     Cervical back: Normal range of motion and neck supple.     Right lower leg: No edema.     Left lower leg: No edema.     Comments: MAE   Skin:    General: Skin is warm and dry.     Capillary Refill: Capillary refill takes less than 2 seconds.  Neurological:     General: No focal deficit present.     Mental Status: She is alert and oriented to person, place, and time. Mental  status is at baseline.     Cranial Nerves: Cranial nerves are intact.     Sensory: Sensation is intact.     Motor: Motor function is intact.     Coordination: Coordination is intact.     Gait: Gait is intact.     Deep Tendon Reflexes: Reflexes are normal and symmetric.  Psychiatric:        Attention and Perception: Attention and perception normal.        Mood and Affect: Mood and affect normal.        Speech: Speech normal.        Behavior: Behavior normal. Behavior is cooperative.        Thought Content: Thought content normal.        Cognition and Memory: Cognition and memory normal.        Judgment: Judgment normal.     Depression Screening  Depression screen [facility] 2/9 05/23/2019 04/27/2019 09/21/2018 06/15/2018 12/19/2017  Decreased Interest 0 0 0 0 0  Down, Depressed, Hopeless 0 0 3 0 0  PHQ - 2 Score 0 0 3 0 0  Altered sleeping 1 0 0 - -  Tired, decreased energy 1 0 0 - -  Change in appetite 1 0 0 - -  Feeling bad or failure about yourself  0 0 0 - -  Trouble concentrating 0 0 0 - -  Moving slowly or fidgety/restless 0 0 0 - -  Suicidal thoughts 0 0 0 - -  PHQ-9 Score 3 0 3 - -  Difficult doing work/chores Somewhat difficult Not difficult at all Not difficult at all - -      Assessment & Plan:   1. Annual visit for general adult medical examination with abnormal findings   2. Allergic rhinitis due to pollen, unspecified seasonality   3. Convulsions, unspecified convulsion type ([place])   4. Vitamin D deficiency   5. Primary insomnia     Tests ordered Orders Placed This Encounter  Procedures  . Hemoglobin A1c  . TSH  . COMPLETE METABOLIC PANEL WITH GFR    Plan: Please see assessment and plan per problem list above.   No orders of the defined types were placed in this encounter.    I have personally reviewed: The patient's medical and social history Their use of alcohol, tobacco or illicit drugs Their current medications and supplements The patient's  functional ability including ADLs,fall risks, home safety risks, cognitive, and hearing and visual impairment Diet and physical activities Evidence for depression or mood disorders  The patient's weight, height, BMI, and visual acuity have  been recorded in the chart.  I have made referrals, counseling, and provided education to the patient based on review of the above and I have provided the patient with a written personalized care plan for preventive services.     Perlie Mayo, NP   05/23/2019

## 2019-05-24 LAB — COMPLETE METABOLIC PANEL WITH GFR
AG Ratio: 1.2 (calc) (ref 1.0–2.5)
ALT: 23 U/L (ref 6–29)
AST: 34 U/L (ref 10–35)
Albumin: 4.2 g/dL (ref 3.6–5.1)
Alkaline phosphatase (APISO): 173 U/L — ABNORMAL HIGH (ref 37–153)
BUN/Creatinine Ratio: 7 (calc) (ref 6–22)
BUN: 4 mg/dL — ABNORMAL LOW (ref 7–25)
CO2: 31 mmol/L (ref 20–32)
Calcium: 10.3 mg/dL (ref 8.6–10.4)
Chloride: 100 mmol/L (ref 98–110)
Creat: 0.6 mg/dL (ref 0.50–0.99)
GFR, Est African American: 115 mL/min/{1.73_m2} (ref >=60)
GFR, Est Non African American: 99 mL/min/{1.73_m2} (ref >=60)
Globulin: 3.4 g/dL (calc) (ref 1.9–3.7)
Glucose, Bld: 103 mg/dL — ABNORMAL HIGH (ref 65–99)
Potassium: 4.7 mmol/L (ref 3.5–5.3)
Sodium: 137 mmol/L (ref 135–146)
Total Bilirubin: 0.2 mg/dL (ref 0.2–1.2)
Total Protein: 7.6 g/dL (ref 6.1–8.1)

## 2019-05-24 LAB — HEMOGLOBIN A1C
Hgb A1c MFr Bld: 5.7 % of total Hgb — ABNORMAL HIGH (ref <5.7)
Mean Plasma Glucose: 117 (calc)
eAG (mmol/L): 6.5 (calc)

## 2019-07-04 ENCOUNTER — Ambulatory Visit (HOSPITAL_COMMUNITY): Payer: 59

## 2019-07-16 ENCOUNTER — Ambulatory Visit (HOSPITAL_COMMUNITY)
Admission: RE | Admit: 2019-07-16 | Discharge: 2019-07-16 | Disposition: A | Discharge status: Home / Self Care | Payer: 59 | Source: Ambulatory Visit | Attending: Family Medicine | Admitting: Family Medicine

## 2019-07-16 ENCOUNTER — Other Ambulatory Visit: Payer: Self-pay

## 2019-07-16 DIAGNOSIS — Z1231 Encounter for screening mammogram for malignant neoplasm of breast: Secondary | ICD-10-CM | POA: Diagnosis present

## 2019-09-25 ENCOUNTER — Telehealth: Payer: 59 | Admitting: Family Medicine

## 2019-09-25 ENCOUNTER — Other Ambulatory Visit: Payer: Self-pay

## 2019-10-02 ENCOUNTER — Other Ambulatory Visit: Payer: Self-pay | Admitting: Family Medicine

## 2019-11-21 ENCOUNTER — Ambulatory Visit: Payer: 59 | Admitting: Family Medicine

## 2019-11-26 ENCOUNTER — Other Ambulatory Visit: Payer: Self-pay

## 2019-11-26 ENCOUNTER — Encounter: Payer: Self-pay | Admitting: Family Medicine

## 2019-11-26 ENCOUNTER — Ambulatory Visit (INDEPENDENT_AMBULATORY_CARE_PROVIDER_SITE_OTHER): Payer: 59 | Admitting: Family Medicine

## 2019-11-26 VITALS — BP 163/80 | HR 73 | Resp 16 | Ht 62.0 in | Wt 133.0 lb

## 2019-11-26 DIAGNOSIS — I10 Essential (primary) hypertension: Secondary | ICD-10-CM

## 2019-11-26 DIAGNOSIS — M818 Other osteoporosis without current pathological fracture: Secondary | ICD-10-CM | POA: Diagnosis not present

## 2019-11-26 DIAGNOSIS — F5101 Primary insomnia: Secondary | ICD-10-CM

## 2019-11-26 DIAGNOSIS — R569 Unspecified convulsions: Secondary | ICD-10-CM | POA: Diagnosis not present

## 2019-11-26 MED ORDER — AMLODIPINE BESYLATE 2.5 MG PO TABS: 2.5000 mg | ORAL_TABLET | Freq: Every day | ORAL | 3 refills | Status: DC

## 2019-11-26 NOTE — Patient Instructions (Addendum)
F/u in office with MD , second week in December, call if you need me sooner. EKG at visit   New additional medication for blood pressure is amlodipine 2.5 mg one daily  Labs  today, chem 7 and eGFr, phenobarb  Level and TSH  Thanks for choosing [facility], we consider it a privelige to serve you.  

## 2019-11-29 ENCOUNTER — Other Ambulatory Visit: Payer: Self-pay | Admitting: Family Medicine

## 2019-12-01 LAB — BASIC METABOLIC PANEL WITH GFR
BUN/Creatinine Ratio: 6 (calc) (ref 6–22)
BUN: 3 mg/dL — ABNORMAL LOW (ref 7–25)
CO2: 32 mmol/L (ref 20–32)
Calcium: 10.1 mg/dL (ref 8.6–10.4)
Chloride: 98 mmol/L (ref 98–110)
Creat: 0.49 mg/dL — ABNORMAL LOW (ref 0.50–0.99)
GFR, Est African American: 122 mL/min/{1.73_m2} (ref >=60)
GFR, Est Non African American: 105 mL/min/{1.73_m2} (ref >=60)
Glucose, Bld: 99 mg/dL (ref 65–99)
Potassium: 4.4 mmol/L (ref 3.5–5.3)
Sodium: 138 mmol/L (ref 135–146)

## 2019-12-01 LAB — PHENOBARBITAL LEVEL: Phenobarbital, Serum: 29.4 mg/L (ref 15.0–40.0)

## 2019-12-01 LAB — TSH: TSH: 1.98 mIU/L (ref 0.40–4.50)

## 2019-12-02 ENCOUNTER — Encounter: Payer: Self-pay | Admitting: Family Medicine

## 2019-12-02 DIAGNOSIS — I10 Essential (primary) hypertension: Secondary | ICD-10-CM | POA: Insufficient documentation

## 2019-12-02 NOTE — Progress Notes (Signed)
   Sandra Riley     MRN: [id number]      DOB: 23-Jun-1960   HPI [name] is here for follow up and re-evaluation of chronic medical conditions, medication management and review of any available recent lab and radiology data.  Preventive health is updated, specifically  Cancer screening and Immunization.   Still reports seizures , however unable to describe the event and has no clear recall The PT denies any adverse reactions to current medications since the last visit.  There are no new concerns.  There are no specific complaints   ROS Denies recent fever or chills. Denies sinus pressure, nasal congestion, ear pain or sore throat. Denies chest congestion, productive cough or wheezing. Denies chest pains, palpitations and leg swelling Denies abdominal pain, nausea, vomiting,diarrhea or constipation.   Denies dysuria, frequency, hesitancy or incontinence. Denies joint pain, swelling and limitation in mobility. Denies headaches, seizures, numbness, or tingling. Denies depression, anxiety or insomnia. Denies skin break down or rash.   PE  BP (!) 163/80   Pulse 73   Resp 16   Ht 5\' 2"  (1.575 m)   Wt 133 lb (60.3 kg)   SpO2 95%   BMI 24.33 kg/m   Patient alert and oriented and in no cardiopulmonary distress.  HEENT: No facial asymmetry, EOMI,     Neck supple .  Chest: Clear to auscultation bilaterally.  CVS: S1, S2 no murmurs, no S3.Regular rate.  ABD: Soft non tender.   Ext: No edema  MS: Adequate ROM spine, shoulders, hips and knees.  Skin: Intact, no ulcerations or rash noted.  Psych: Good eye contact, normal affect. Memory intact not anxious or depressed appearing.  CNS: CN 2-12 intact, power,  normal throughout.no focal deficits noted.   Assessment & Plan  Essential hypertension Start amlodipine 2.5 mg daily and re assess DASH diet and commitment to daily physical activity for a minimum of 30 minutes discussed and encouraged, as a part of  hypertension management. The importance of attaining a healthy weight is also discussed.  BP/Weight 11/26/2019 05/23/2019 04/27/2019 02/15/2019 02/14/2019 12/18/2018 65/04/5463  Systolic BP 681 275 170 017 [phone]  Diastolic BP 80 80 82 63 65 70 81  Wt. (Lbs) 133 137 137.4 - 142 142 143.6  BMI 24.33 25.06 25.13 - 25.97 25.97 26.26       Insomnia Sleep hygiene reviewed and written information offered also. Prescription sent for  medication needed.   Convulsions Managed by Neurology and contiues to report breakthrough seizure activity, neurology to address  Osteoporosis without current pathological fracture Treated with fosamax

## 2019-12-02 NOTE — Assessment & Plan Note (Signed)
Treated with fosamax

## 2019-12-02 NOTE — Assessment & Plan Note (Signed)
Start amlodipine 2.5 mg daily and re assess DASH diet and commitment to daily physical activity for a minimum of 30 minutes discussed and encouraged, as a part of hypertension management. The importance of attaining a healthy weight is also discussed.  BP/Weight 11/26/2019 05/23/2019 04/27/2019 02/15/2019 02/14/2019 12/18/2018 33/74/4514  Systolic BP 604 799 872 [phone] 485  Diastolic BP 80 80 82 63 65 70 81  Wt. (Lbs) 133 137 137.4 - 142 142 143.6  BMI 24.33 25.06 25.13 - 25.97 25.97 26.26

## 2019-12-02 NOTE — Assessment & Plan Note (Signed)
Sleep hygiene reviewed and written information offered also. Prescription sent for  medication needed.  

## 2019-12-02 NOTE — Assessment & Plan Note (Signed)
Managed by Neurology and contiues to report breakthrough seizure activity, neurology to address

## 2020-01-15 ENCOUNTER — Other Ambulatory Visit: Payer: Self-pay

## 2020-01-15 ENCOUNTER — Encounter: Payer: Self-pay | Admitting: Family Medicine

## 2020-01-15 ENCOUNTER — Ambulatory Visit (INDEPENDENT_AMBULATORY_CARE_PROVIDER_SITE_OTHER): Payer: 59 | Admitting: Family Medicine

## 2020-01-15 VITALS — BP 177/80 | HR 96 | Resp 15 | Ht 62.0 in | Wt 128.0 lb

## 2020-01-15 DIAGNOSIS — R7301 Impaired fasting glucose: Secondary | ICD-10-CM

## 2020-01-15 DIAGNOSIS — M810 Age-related osteoporosis without current pathological fracture: Secondary | ICD-10-CM

## 2020-01-15 DIAGNOSIS — I10 Essential (primary) hypertension: Secondary | ICD-10-CM | POA: Diagnosis not present

## 2020-01-15 DIAGNOSIS — R569 Unspecified convulsions: Secondary | ICD-10-CM

## 2020-01-15 DIAGNOSIS — F5104 Psychophysiologic insomnia: Secondary | ICD-10-CM | POA: Diagnosis not present

## 2020-01-15 MED ORDER — SPIRONOLACTONE 25 MG PO TABS: 25.0000 mg | ORAL_TABLET | Freq: Every day | ORAL | 3 refills | Status: DC

## 2020-01-15 MED ORDER — AMLODIPINE BESYLATE 5 MG PO TABS: 5.0000 mg | ORAL_TABLET | Freq: Every day | ORAL | 3 refills | Status: DC

## 2020-01-15 NOTE — Patient Instructions (Addendum)
F/U in office re evaluate blod pressure last week in [place], call if you need me sooner  EKG is normal  New blood pressure medication Amlodipine 5 mg one daily. Stop amlodipine 2.5 mg tablet Spironolactone 25 mg one daily  Non fast HBA1c, chem 7 and eGFr 5 days before next visit  Best for 20221  Thanks for choosing [facility], we consider it a privelige to serve you.

## 2020-01-20 ENCOUNTER — Encounter: Payer: Self-pay | Admitting: Family Medicine

## 2020-01-20 NOTE — Assessment & Plan Note (Signed)
Continue fosamax, need updated dexa

## 2020-01-20 NOTE — Assessment & Plan Note (Signed)
Sleep hygiene reviewed and written information offered also. Prescription sent for  medication needed.  

## 2020-01-20 NOTE — Assessment & Plan Note (Signed)
Controlled, no change in medication Managed by Neurology 

## 2020-01-20 NOTE — Progress Notes (Signed)
   Pamala Hayman     MRN: [id number]      DOB: Jul 21, 1960   HPI Ms. Sandra Riley is here for follow up and re-evaluation of chronic medical conditions, medication management and review of any available recent lab and radiology data.  Preventive health is updated, specifically  Cancer screening and Immunization.   Questions or concerns regarding consultations or procedures which the PT has had in the interim are  addressed. The PT denies any adverse reactions to current medications since the last visit.  There are no new concerns.  There are no specific complaints   ROS Denies recent fever or chills. Denies sinus pressure, nasal congestion, ear pain or sore throat. Denies chest congestion, productive cough or wheezing. Denies chest pains, palpitations and leg swelling Denies abdominal pain, nausea, vomiting,diarrhea or constipation.   Denies dysuria, frequency, hesitancy or incontinence. Denies joint pain, swelling and limitation in mobility. Denies headaches, seizures, numbness, or tingling. Denies depression, anxiety or insomnia. Denies skin break down or rash.   PE  BP (!) 177/80   Pulse 96   Resp 15   Ht 5\' 2"  (1.575 m)   Wt 128 lb (58.1 kg)   SpO2 93%   BMI 23.41 kg/m   Patient alert and oriented and in no cardiopulmonary distress.  HEENT: No facial asymmetry, EOMI,     Neck supple .  Chest: Clear to auscultation bilaterally.  CVS: S1, S2 no murmurs, no S3.Regular rate.  ABD: Soft non tender.   Ext: No edema  MS: Adequate ROM spine, shoulders, hips and knees.  Skin: Intact, no ulcerations or rash noted.  Psych: Good eye contact, normal affect. Memory intact not anxious or depressed appearing.  CNS: CN 2-12 intact, power,  normal throughout.no focal deficits noted.   Assessment & Plan  Essential hypertension Uncontrolled , increase amlodipine Baseline EKG: NSR, no ischemia, no LVH DASH diet and commitment to daily physical activity for a minimum of 30  minutes discussed and encouraged, as a part of hypertension management. The importance of attaining a healthy weight is also discussed.  BP/Weight 01/15/2020 11/26/2019 05/23/2019 04/27/2019 02/15/2019 02/14/2019 01/20/7587  Systolic BP 325 498 264 [phone] 680  Diastolic BP 80 80 80 82 63 65 70  Wt. (Lbs) 128 133 137 137.4 - 142 142  BMI 23.41 24.33 25.06 25.13 - 25.97 25.97       Insomnia Sleep hygiene reviewed and written information offered also. Prescription sent for  medication needed.   Convulsions Controlled, no change in medication Managed  by Neurology  Osteoporosis without current pathological fracture Continue fosamax, need updated dexa

## 2020-01-20 NOTE — Assessment & Plan Note (Signed)
Uncontrolled , increase amlodipine Baseline EKG: NSR, no ischemia, no LVH DASH diet and commitment to daily physical activity for a minimum of 30 minutes discussed and encouraged, as a part of hypertension management. The importance of attaining a healthy weight is also discussed.  BP/Weight 01/15/2020 11/26/2019 05/23/2019 04/27/2019 02/15/2019 02/14/2019 26/33/3545  Systolic BP 625 638 937 342 [phone]  Diastolic BP 80 80 80 82 63 65 70  Wt. (Lbs) 128 133 137 137.4 - 142 142  BMI 23.41 24.33 25.06 25.13 - 25.97 25.97

## 2020-01-24 ENCOUNTER — Encounter: Payer: Self-pay | Admitting: Nurse Practitioner

## 2020-01-24 ENCOUNTER — Other Ambulatory Visit: Payer: Self-pay

## 2020-01-24 ENCOUNTER — Telehealth (INDEPENDENT_AMBULATORY_CARE_PROVIDER_SITE_OTHER): Payer: 59 | Admitting: Nurse Practitioner

## 2020-01-24 DIAGNOSIS — Z0000 Encounter for general adult medical examination without abnormal findings: Secondary | ICD-10-CM | POA: Diagnosis not present

## 2020-01-24 NOTE — Patient Instructions (Signed)
Sandra Riley , Thank you for taking time to come for your Medicare Wellness Visit. I appreciate your ongoing commitment to your health goals. Please review the following plan we discussed and let me know if I can assist you in the future.   Screening recommendations/referrals: Colonoscopy: Completed; 12/15/29  Mammogram: Completed; 07/15/21  Bone Density: Not indicated at this time.   Recommended yearly ophthalmology/optometry visit for glaucoma screening and checkup Recommended yearly dental visit for hygiene and checkup  Vaccinations: Influenza vaccine: Completed  Pneumococcal vaccine: Completed  Tdap vaccine: Completed; 09/13/20  Shingles vaccine: Completed     Advanced directives: N/A   Conditions/risks identified: None   Next appointment: 02/28/20 @ 9:20 am with Dr. Moshe Cipro   Preventive Care 40-64 Years, Female Preventive care refers to lifestyle choices and visits with your health care provider that can promote health and wellness. What does preventive care include?  A yearly physical exam. This is also called an annual well check.  Dental exams once or twice a year.  Routine eye exams. Ask your health care provider how often you should have your eyes checked.  Personal lifestyle choices, including:  Daily care of your teeth and gums.  Regular physical activity.  Eating a healthy diet.  Avoiding tobacco and drug use.  Limiting alcohol use.  Practicing safe sex.  Taking low-dose aspirin daily starting at age 45.  Taking vitamin and mineral supplements as recommended by your health care provider. What happens during an annual well check? The services and screenings done by your health care provider during your annual well check will depend on your age, overall health, lifestyle risk factors, and family history of disease. Counseling  Your health care provider may ask you questions about your:  Alcohol use.  Tobacco use.  Drug use.  Emotional  well-being.  Home and relationship well-being.  Sexual activity.  Eating habits.  Work and work Statistician.  Method of birth control.  Menstrual cycle.  Pregnancy history. Screening  You may have the following tests or measurements:  Height, weight, and BMI.  Blood pressure.  Lipid and cholesterol levels. These may be checked every 5 years, or more frequently if you are over 58 years old.  Skin check.  Lung cancer screening. You may have this screening every year starting at age 70 if you have a 30-pack-year history of smoking and currently smoke or have quit within the past 15 years.  Fecal occult blood test (FOBT) of the stool. You may have this test every year starting at age 40.  Flexible sigmoidoscopy or colonoscopy. You may have a sigmoidoscopy every 5 years or a colonoscopy every 10 years starting at age 3.  Hepatitis C blood test.  Hepatitis B blood test.  Sexually transmitted disease (STD) testing.  Diabetes screening. This is done by checking your blood sugar (glucose) after you have not eaten for a while (fasting). You may have this done every 1-3 years.  Mammogram. This may be done every 1-2 years. Talk to your health care provider about when you should start having regular mammograms. This may depend on whether you have a family history of breast cancer.  BRCA-related cancer screening. This may be done if you have a family history of breast, ovarian, tubal, or peritoneal cancers.  Pelvic exam and Pap test. This may be done every 3 years starting at age 39. Starting at age 66, this may be done every 5 years if you have a Pap test in combination with an HPV test.  Bone density scan. This is done to screen for osteoporosis. You may have this scan if you are at high risk for osteoporosis. Discuss your test results, treatment options, and if necessary, the need for more tests with your health care provider. Vaccines  Your health care provider may recommend  certain vaccines, such as:  Influenza vaccine. This is recommended every year.  Tetanus, diphtheria, and acellular pertussis (Tdap, Td) vaccine. You may need a Td booster every 10 years.  Zoster vaccine. You may need this after age 16.  Pneumococcal 13-valent conjugate (PCV13) vaccine. You may need this if you have certain conditions and were not previously vaccinated.  Pneumococcal polysaccharide (PPSV23) vaccine. You may need one or two doses if you smoke cigarettes or if you have certain conditions. Talk to your health care provider about which screenings and vaccines you need and how often you need them. This information is not intended to replace advice given to you by your health care provider. Make sure you discuss any questions you have with your health care provider. Document Released: 02/14/2015 Document Revised: 10/08/2015 Document Reviewed: 11/19/2014 Elsevier Interactive Patient Education  2017 [place] Prevention in the Home Falls can cause injuries. They can happen to people of all ages. There are many things you can do to make your home safe and to help prevent falls. What can I do on the outside of my home?  Regularly fix the edges of walkways and driveways and fix any cracks.  Remove anything that might make you trip as you walk through a door, such as a raised step or threshold.  Trim any bushes or trees on the path to your home.  Use bright outdoor lighting.  Clear any walking paths of anything that might make someone trip, such as rocks or tools.  Regularly check to see if handrails are loose or broken. Make sure that both sides of any steps have handrails.  Any raised decks and porches should have guardrails on the edges.  Have any leaves, snow, or ice cleared regularly.  Use sand or salt on walking paths during winter.  Clean up any spills in your garage right away. This includes oil or grease spills. What can I do in the bathroom?  Use  night lights.  Install grab bars by the toilet and in the tub and shower. Do not use towel bars as grab bars.  Use non-skid mats or decals in the tub or shower.  If you need to sit down in the shower, use a plastic, non-slip stool.  Keep the floor dry. Clean up any water that spills on the floor as soon as it happens.  Remove soap buildup in the tub or shower regularly.  Attach bath mats securely with double-sided non-slip rug tape.  Do not have throw rugs and other things on the floor that can make you trip. What can I do in the bedroom?  Use night lights.  Make sure that you have a light by your bed that is easy to reach.  Do not use any sheets or blankets that are too big for your bed. They should not hang down onto the floor.  Have a firm chair that has side arms. You can use this for support while you get dressed.  Do not have throw rugs and other things on the floor that can make you trip. What can I do in the kitchen?  Clean up any spills right away.  Avoid walking on  wet floors.  Keep items that you use a lot in easy-to-reach places.  If you need to reach something above you, use a strong step stool that has a grab bar.  Keep electrical cords out of the way.  Do not use floor polish or wax that makes floors slippery. If you must use wax, use non-skid floor wax.  Do not have throw rugs and other things on the floor that can make you trip. What can I do with my stairs?  Do not leave any items on the stairs.  Make sure that there are handrails on both sides of the stairs and use them. Fix handrails that are broken or loose. Make sure that handrails are as long as the stairways.  Check any carpeting to make sure that it is firmly attached to the stairs. Fix any carpet that is loose or worn.  Avoid having throw rugs at the top or bottom of the stairs. If you do have throw rugs, attach them to the floor with carpet tape.  Make sure that you have a light switch at  the top of the stairs and the bottom of the stairs. If you do not have them, ask someone to add them for you. What else can I do to help prevent falls?  Wear shoes that:  Do not have high heels.  Have rubber bottoms.  Are comfortable and fit you well.  Are closed at the toe. Do not wear sandals.  If you use a stepladder:  Make sure that it is fully opened. Do not climb a closed stepladder.  Make sure that both sides of the stepladder are locked into place.  Ask someone to hold it for you, if possible.  Clearly mark and make sure that you can see:  Any grab bars or handrails.  First and last steps.  Where the edge of each step is.  Use tools that help you move around (mobility aids) if they are needed. These include:  Canes.  Walkers.  Scooters.  Crutches.  Turn on the lights when you go into a dark area. Replace any light bulbs as soon as they burn out.  Set up your furniture so you have a clear path. Avoid moving your furniture around.  If any of your floors are uneven, fix them.  If there are any pets around you, be aware of where they are.  Review your medicines with your doctor. Some medicines can make you feel dizzy. This can increase your chance of falling. Ask your doctor what other things that you can do to help prevent falls. This information is not intended to replace advice given to you by your health care provider. Make sure you discuss any questions you have with your health care provider. Document Released: 11/14/2008 Document Revised: 06/26/2015 Document Reviewed: 02/22/2014 Elsevier Interactive Patient Education  2017 [employer].

## 2020-01-24 NOTE — Progress Notes (Signed)
Subjective:   Sandra Riley is a 59 y.o. female who presents for Medicare Annual (Subsequent) preventive examination.       Objective:    There were no vitals filed for this visit. There is no height or weight on file to calculate BMI.  Advanced Directives 02/15/2019 02/14/2019 09/19/2017 09/20/2016  Does Patient Have a Medical Advance Directive? No No Yes;No No  Does patient want to make changes to medical advance directive? - - Yes (ED - Information included in AVS) -  Would patient like information on creating a medical advance directive? No - Patient declined No - Patient declined - Yes (MAU/Ambulatory/Procedural Areas - Information given)    Current Medications (verified) Outpatient Encounter Medications as of 01/24/2020  Medication Sig  . acetaminophen (TYLENOL) 500 MG tablet Take 500 mg by mouth every 6 (six) hours as needed for moderate pain.   Marland Kitchen alendronate (FOSAMAX) 35 MG tablet Take 1 tablet (35 mg total) by mouth every 7 (seven) days. Take with a full glass of water on an empty stomach.  Marland Kitchen amLODipine (NORVASC) 5 MG tablet Take 1 tablet (5 mg total) by mouth daily.  Marland Kitchen aspirin EC 81 MG tablet Take 81 mg by mouth daily.  . Calcium Carb-Cholecalciferol 1000-800 MG-UNIT TABS Take 1 tablet by mouth 3 (three) times daily.   . Cholecalciferol (VITAMIN D) 50 MCG (2000 UT) CAPS Take 1 capsule by mouth daily.  . clonazePAM (KLONOPIN) 1 MG tablet Take 1 tablet (1 mg total) by mouth 2 (two) times daily as needed for anxiety.  . ferrous sulfate 325 (65 FE) MG tablet Take 325 mg by mouth daily with breakfast.  . levETIRAcetam (KEPPRA) 500 MG tablet Take 1,000 mg by mouth 2 (two) times daily. Two tablets twice daily  . MAGNESIUM PO Take 500 mg by mouth daily.   . mirtazapine (REMERON) 30 MG tablet TAKE ONE TABLET BY MOUTH AT BEDTIME  . Multiple Vitamin (MULTIVITAMIN) tablet Take 1 tablet by mouth daily.  Marland Kitchen PHENobarbital (LUMINAL) 97.2 MG tablet Take 97.2 mg by mouth 2 (two) times  daily.  . Potassium 99 MG TABS Take 99 mg by mouth daily.   Marland Kitchen spironolactone (ALDACTONE) 25 MG tablet Take 1 tablet (25 mg total) by mouth daily.  Marland Kitchen zinc gluconate 50 MG tablet Take 50 mg by mouth daily.   No facility-administered encounter medications on file as of 01/24/2020.    Allergies (verified) Aspirin and Singulair [montelukast sodium]   History: Past Medical History:  Diagnosis Date  . Abnormal facial hair 05/11/2011  . Anxiety and depression   . GAD (generalized anxiety disorder)   . Insomnia   . Psychotic disorder (HCC)   . Seizure disorder (HCC)    stress related seizures. last one was a "while back"  . 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 ABDOMINAL HYSTERECTOMY    . CESAREAN SECTION    . CHOLECYSTECTOMY    . COLONOSCOPY WITH PROPOFOL N/A 02/15/2019   Procedure: COLONOSCOPY WITH PROPOFOL;  Surgeon: Corbin Ade, MD;  Location: [facility];  Service: Endoscopy;  Laterality: N/A;  2:45pm  . POLYPECTOMY  02/15/2019   Procedure: POLYPECTOMY;  Surgeon: Corbin Ade, MD;  Location: [facility];  Service: Endoscopy;;   Family History  Problem Relation Age of Onset  . Breast cancer Mother 102  . Coronary artery disease Father   . Stroke Father 28  . Breast cancer Sister 3  .  Breast cancer Sister   . Breast cancer Sister   . Diabetes Brother   . Heart attack Brother    Social History   Socioeconomic History  . Marital status: Married    Spouse name: Not on file  . Number of children: 3  . Years of education: Not on file  . Highest education level: 12th grade  Occupational History  . Not on file  Tobacco Use  . Smoking status: Never Smoker  . Smokeless tobacco: Never Used  Vaping Use  . Vaping Use: Never used  Substance and Sexual Activity  . Alcohol use: No  . Drug use: No  . Sexual activity: Yes    Birth control/protection: Surgical  Other Topics Concern  . Not on file  Social History  Narrative  . Not on file   Social Determinants of Health   Financial Resource Strain: Not on file  Food Insecurity: Not on file  Transportation Needs: Not on file  Physical Activity: Not on file  Stress: Not on file  Social Connections: Not on file    Tobacco Counseling Counseling given: Not Answered                  Diabetic? No          Activities of Daily Living In your present state of health, do you have any difficulty performing the following activities: 02/14/2019  Hearing? N  Vision? N  Difficulty concentrating or making decisions? N  Walking or climbing stairs? N  Dressing or bathing? N  Doing errands, shopping? N  Some recent data might be hidden    Patient Care Team: Fayrene Helper, MD as PCP - General Phillips Odor, MD as Consulting Physician (Neurology)  Indicate any recent Medical Services you may have received from other than [facility] providers in the past year (date may be approximate).     Assessment:   This is a routine wellness examination for [facility].  Hearing/Vision screen No exam data present  Dietary issues and exercise activities discussed:    Goals    . Exercise 3x per week (30 min per time)     Recommend increasing your exercise routine at least 3 days a week for 30-45 minutes at a time as tolerated.        Depression Screen PHQ 2/9 Scores 01/15/2020 05/23/2019 04/27/2019 09/21/2018 06/15/2018 12/19/2017 09/19/2017  PHQ - 2 Score 0 0 0 3 0 0 0  PHQ- 9 Score 0 3 0 3 - - -    Fall Risk Fall Risk  11/26/2019 05/23/2019 12/18/2018 09/21/2018 06/15/2018  Falls in the past year? 0 0 0 0 1  Number falls in past yr: 0 0 0 0 0  Injury with Fall? 0 0 0 0 0  Follow up - - - - Falls evaluation completed;Education provided;Falls prevention discussed;Follow up appointment    FALL RISK PREVENTION PERTAINING TO THE HOME:  Any stairs in or around the home? No  If so, are there any without handrails? No  Home free of loose throw rugs  in walkways, pet beds, electrical cords, etc? Yes  Adequate lighting in your home to reduce risk of falls? Yes   ASSISTIVE DEVICES UTILIZED TO PREVENT FALLS:  Life alert? No  Use of a cane, walker or w/c? No  Grab bars in the bathroom? No  Shower chair or bench in shower? No  Elevated toilet seat or a handicapped toilet? No   TIMED UP AND GO:  Was the test  performed? No .          6CIT Screen 09/21/2018 09/19/2017 09/20/2016  What Year? 0 points 0 points 0 points  What month? 0 points 0 points 0 points  What time? 0 points 0 points 0 points  Count back from 20 0 points 0 points 0 points  Months in reverse 0 points 0 points 0 points  Repeat phrase 0 points 4 points 0 points  Total Score 0 4 0    Immunizations Immunization History  Administered Date(s) Administered  . H1N1 12/05/2007  . Influenza Split 12/15/2010, 11/23/2011, 12/02/2014  . Influenza Whole 12/20/2006, 12/10/2009  . Influenza,inj,Quad PF,6+ Mos 12/04/2012  . Influenza-Unspecified 11/06/2019  . Moderna Sars-Covid-2 Vaccination 04/23/2019, 05/21/2019, 12/05/2019  . Pneumococcal Conjugate-13 03/12/2014  . Pneumococcal Polysaccharide-23 03/03/2009  . Td 02/01/2001  . Tdap 09/14/2010  . Zoster Recombinat (Shingrix) 09/27/2018, 01/17/2019    TDAP status: Up to date  Flu Vaccine status: Up to date  Pneumococcal vaccine status: Up to date  Covid-19 vaccine status: Completed vaccines  Qualifies for Shingles Vaccine? Yes   Zostavax completed Yes   Shingrix Completed?: Yes  Screening Tests Health Maintenance  Topic Date Due  . PAP SMEAR-Modifier  07/18/2020  . TETANUS/TDAP  09/13/2020  . MAMMOGRAM  07/15/2021  . COLONOSCOPY  02/14/2029  . INFLUENZA VACCINE  Completed  . COVID-19 Vaccine  Completed  . Hepatitis C Screening  Completed  . HIV Screening  Completed    Health Maintenance  There are no preventive care reminders to display for this patient.  Colorectal cancer screening: Type of  screening: Colonoscopy. Completed normal. Repeat every 10 years  Mammogram status: Completed normal. Repeat every year   Lung Cancer Screening: (Low Dose CT Chest recommended if Age 40-80 years, 30 pack-year currently smoking OR have quit w/in 15years.) does not qualify.    Additional Screening:  Hepatitis C Screening: does qualify; Completed.   Vision Screening: Recommended annual ophthalmology exams for early detection of glaucoma and other disorders of the eye. Is the patient up to date with their annual eye exam?  Yes    Who is the provider or what is the name of the office in which the patient attends annual eye exams? [facility] [place]  If pt is not established with a provider, would they like to be referred to a provider to establish care? No .   Dental Screening: Recommended annual dental exams for proper oral hygiene  Community Resource Referral / Chronic Care Management: CRR required this visit?  No   CCM required this visit?  No      Plan:     I have personally reviewed and noted the following in the patient's chart:   . Medical and social history . Use of alcohol, tobacco or illicit drugs  . Current medications and supplements . Functional ability and status . Nutritional status . Physical activity . Advanced directives . List of other physicians . Hospitalizations, surgeries, and ER visits in previous 12 months . Vitals . Screenings to include cognitive, depression, and falls . Referrals and appointments  In addition, I have reviewed and discussed with patient certain preventive protocols, quality metrics, and best practice recommendations. A written personalized care plan for preventive services as well as general preventive health recommendations were provided to patient.     Lonn Georgia, LPN   16/11/9602   Nurse Notes: AWV conducted over the phone with pt consent to televisit via audio. Pt present in the  home at the time and provider in the  office. Call took approx 20 min to complete.

## 2020-02-28 ENCOUNTER — Ambulatory Visit: Payer: 59 | Admitting: Family Medicine

## 2020-02-28 LAB — BMP8+EGFR
BUN/Creatinine Ratio: 8 — ABNORMAL LOW (ref 12–28)
BUN: 4 mg/dL — ABNORMAL LOW (ref 8–27)
CO2: 26 mmol/L (ref 20–29)
Calcium: 9.6 mg/dL (ref 8.7–10.3)
Chloride: 102 mmol/L (ref 96–106)
Creatinine, Ser: 0.51 mg/dL — ABNORMAL LOW (ref 0.57–1.00)
GFR calc Af Amer: 120 mL/min/{1.73_m2} (ref >59)
GFR calc non Af Amer: 104 mL/min/{1.73_m2} (ref >59)
Glucose: 78 mg/dL (ref 65–99)
Potassium: 4.2 mmol/L (ref 3.5–5.2)
Sodium: 140 mmol/L (ref 134–144)

## 2020-02-28 LAB — HEMOGLOBIN A1C
Est. average glucose Bld gHb Est-mCnc: 114 mg/dL
Hgb A1c MFr Bld: 5.6 % (ref 4.8–5.6)

## 2020-03-05 ENCOUNTER — Telehealth (INDEPENDENT_AMBULATORY_CARE_PROVIDER_SITE_OTHER): Payer: 59 | Admitting: Family Medicine

## 2020-03-05 ENCOUNTER — Encounter: Payer: Self-pay | Admitting: Family Medicine

## 2020-03-05 ENCOUNTER — Other Ambulatory Visit: Payer: Self-pay

## 2020-03-05 VITALS — Ht 62.0 in | Wt 128.0 lb

## 2020-03-05 DIAGNOSIS — F341 Dysthymic disorder: Secondary | ICD-10-CM | POA: Diagnosis not present

## 2020-03-05 DIAGNOSIS — I10 Essential (primary) hypertension: Secondary | ICD-10-CM | POA: Diagnosis not present

## 2020-03-05 DIAGNOSIS — M810 Age-related osteoporosis without current pathological fracture: Secondary | ICD-10-CM

## 2020-03-05 DIAGNOSIS — F5101 Primary insomnia: Secondary | ICD-10-CM

## 2020-03-05 DIAGNOSIS — Z1231 Encounter for screening mammogram for malignant neoplasm of breast: Secondary | ICD-10-CM

## 2020-03-05 DIAGNOSIS — R569 Unspecified convulsions: Secondary | ICD-10-CM

## 2020-03-05 NOTE — Patient Instructions (Addendum)
F/U in office for evaluation of uncontrolled blood pressure in next 2 to 4 weeks  F/U with Dr Moshe Cipro in 4.5 months  Please schedule mammogram due in June at this visit  No change in current medications, you report no seizures  It is important that you exercise regularly at least 30 minutes 5 times a week. If you develop chest pain, have severe difficulty breathing, or feel very tired, stop exercising immediately and seek medical attention    Think about what you will eat, plan ahead. Choose " clean, green, fresh or frozen" over canned, processed or packaged foods which are more sugary, salty and fatty. 70 to 75% of food eaten should be vegetables and fruit. Three meals at set times with snacks allowed between meals, but they must be fruit or vegetables. Aim to eat over a 12 hour period , example 7 am to 7 pm, and STOP after  your last meal of the day. Drink water,generally about 64 ounces per day, no other drink is as healthy. Fruit juice is best enjoyed in a healthy way, by EATING the fruit.  Thanks for choosing [facility], we consider it a privelige to serve you.

## 2020-03-06 ENCOUNTER — Telehealth: Payer: 59 | Admitting: Family Medicine

## 2020-03-08 ENCOUNTER — Encounter: Payer: Self-pay | Admitting: Family Medicine

## 2020-03-08 NOTE — Assessment & Plan Note (Signed)
Controlled, no change in medication ?Sleep hygiene reviewed  ?Prescription sent for  medication needed. ? ?

## 2020-03-08 NOTE — Progress Notes (Signed)
Virtual Visit via Telephone Note  I connected with Sandra Riley on 03/08/20 at 11:00 AM EST by telephone and verified that I am speaking with the correct person using two identifiers.  Location: Patient: home Provider: work   I discussed the limitations, risks, security and privacy concerns of performing an evaluation and management service by telephone and the availability of in person appointments. I also discussed with the patient that there may be a patient responsible charge related to this service. The patient expressed understanding and agreed to proceed.   History of Present Illness: F/U chronic problems and address any new or current concerns. Review and update medications and allergies. Review recent lab and radiologic data . Update routine health maintainace. Review an encourage improved health habits to include nutrition, exercise and  sleep .  Denies recent fever or chills. Denies sinus pressure, nasal congestion, ear pain or sore throat. Denies chest congestion, productive cough or wheezing. Denies chest pains, palpitations and leg swelling Denies abdominal pain, nausea, vomiting,diarrhea or constipation.   Denies dysuria, frequency, hesitancy or incontinence. Denies joint pain, swelling and limitation in mobility. Denies headaches, seizures, numbness, or tingling. Denies depression, anxiety or insomnia. Denies skin break down or rash.       Observations/Objective: Ht 5\' 2"  (1.575 m)   Wt 128 lb (58.1 kg)   BMI 23.41 kg/m  Good communication with no confusion and intact memory. Alert and oriented x 3 No signs of respiratory distress during speech  '  Assessment and Plan: Essential hypertension DASH diet and commitment to daily physical activity for a minimum of 30 minutes discussed and encouraged, as a part of hypertension management. The importance of attaining a healthy weight is also discussed.  BP/Weight 03/05/2020 01/15/2020 11/26/2019 05/23/2019  04/27/2019 02/15/2019 06/18/15  Systolic BP - 494 496 759 163 846 659  Diastolic BP - 80 80 80 82 63 65  Wt. (Lbs) 128 128 133 137 137.4 - 142  BMI 23.41 23.41 24.33 25.06 25.13 - 25.97   Needs in office visit, uncontrolled    Convulsions Controlled, no change in medication   ANXIETY DEPRESSION Controlled, no change in medication   Insomnia Controlled, no change in medication Sleep hygiene reviewed . Prescription sent for  medication needed.   Osteoporosis without current pathological fracture Continue current management     Follow Up Instructions:    I discussed the assessment and treatment plan with the patient. The patient was provided an opportunity to ask questions and all were answered. The patient agreed with the plan and demonstrated an understanding of the instructions.   The patient was advised to call back or seek an in-person evaluation if the symptoms worsen or if the condition fails to improve as anticipated.  I provided 21 minutes of non-face-to-face time during this encounter.   Tula Nakayama, MD

## 2020-03-08 NOTE — Assessment & Plan Note (Signed)
DASH diet and commitment to daily physical activity for a minimum of 30 minutes discussed and encouraged, as a part of hypertension management. The importance of attaining a healthy weight is also discussed.  BP/Weight 03/05/2020 01/15/2020 11/26/2019 05/23/2019 04/27/2019 02/15/2019 3/36/1224  Systolic BP - 497 530 051 102 111 735  Diastolic BP - 80 80 80 82 63 65  Wt. (Lbs) 128 128 133 137 137.4 - 142  BMI 23.41 23.41 24.33 25.06 25.13 - 25.97   Needs in office visit, uncontrolled

## 2020-03-08 NOTE — Assessment & Plan Note (Signed)
Continue current management

## 2020-03-08 NOTE — Assessment & Plan Note (Signed)
Controlled, no change in medication  

## 2020-03-10 ENCOUNTER — Other Ambulatory Visit: Payer: Self-pay | Admitting: Family Medicine

## 2020-03-13 ENCOUNTER — Encounter: Payer: Self-pay | Admitting: Family Medicine

## 2020-03-18 ENCOUNTER — Other Ambulatory Visit: Payer: Self-pay

## 2020-03-18 DIAGNOSIS — M810 Age-related osteoporosis without current pathological fracture: Secondary | ICD-10-CM

## 2020-03-18 MED ORDER — ALENDRONATE SODIUM 35 MG PO TABS: 35.0000 mg | ORAL_TABLET | ORAL | 5 refills | Status: DC

## 2020-03-26 ENCOUNTER — Other Ambulatory Visit: Payer: Self-pay

## 2020-03-26 ENCOUNTER — Encounter: Payer: Self-pay | Admitting: Internal Medicine

## 2020-03-26 ENCOUNTER — Ambulatory Visit (INDEPENDENT_AMBULATORY_CARE_PROVIDER_SITE_OTHER): Payer: 59 | Admitting: Internal Medicine

## 2020-03-26 VITALS — BP 138/81 | HR 75 | Resp 18 | Ht 62.0 in | Wt 131.1 lb

## 2020-03-26 DIAGNOSIS — R251 Tremor, unspecified: Secondary | ICD-10-CM

## 2020-03-26 DIAGNOSIS — G40309 Generalized idiopathic epilepsy and epileptic syndromes, not intractable, without status epilepticus: Secondary | ICD-10-CM | POA: Insufficient documentation

## 2020-03-26 DIAGNOSIS — B351 Tinea unguium: Secondary | ICD-10-CM | POA: Diagnosis not present

## 2020-03-26 DIAGNOSIS — G40919 Epilepsy, unspecified, intractable, without status epilepticus: Secondary | ICD-10-CM | POA: Insufficient documentation

## 2020-03-26 DIAGNOSIS — I10 Essential (primary) hypertension: Secondary | ICD-10-CM | POA: Diagnosis not present

## 2020-03-26 NOTE — Progress Notes (Signed)
Established Patient Office Visit  Subjective:  Patient ID: Sandra Riley, female    DOB: [birth year]  Age: 60 y.o. MRN: [id number]  CC:  Chief Complaint  Patient presents with  . Hypertension    Blood pressure check per dr simpson sometimes nerves in arms and legs get restless for about a month     HPI Sandra Riley is a 60 year old female with PMH of HTN and seizure disorder who presents for follow up of HTN and an episode of seizure.  BP is well-controlled. Takes medications regularly. Patient denies headache, dizziness, chest pain, dyspnea or palpitations.  She has been having shaking sensation in b/l arms and legs, both at day and nighttime. She has h/o seizure disorder and has been taking Phenobarbital and Keppra. She reports an episode of seizure in the last week, which was witnessed by her husband. She did not lose consciousness, but rolled her eyes and had confusion after it. Denies any generalized convulsions at that time. Did not bite her tongue.  She has a h/o left thumbnail injury from glue, and it had ripped off. She has been having dry crusting in the nail area.  Past Medical History:  Diagnosis Date  . Abnormal facial hair 05/11/2011  . Anxiety and depression   . GAD (generalized anxiety disorder)   . Insomnia   . Psychotic disorder ([place])   . Seizure disorder ([place])    stress related seizures. last one was a "while back"  . 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 ABDOMINAL HYSTERECTOMY    . CESAREAN SECTION    . CHOLECYSTECTOMY    . COLONOSCOPY WITH PROPOFOL N/A 02/15/2019   Procedure: COLONOSCOPY WITH PROPOFOL;  Surgeon: Daneil Dolin, MD;  [facility];  Service: Endoscopy;  Laterality: N/A;  2:45pm  . POLYPECTOMY  02/15/2019   Procedure: POLYPECTOMY;  Surgeon: Daneil Dolin, MD;  Location: [facility];  Service: Endoscopy;;    Family History  Problem Relation Age of Onset  . Breast  cancer Mother 56  . Coronary artery disease Father   . Stroke Father 18  . Breast cancer Sister 40  . Breast cancer Sister   . Breast cancer Sister   . Diabetes Brother   . Heart attack Brother     Social History   Socioeconomic History  . Marital status: Married    Spouse name: Not on file  . Number of children: 3  . Years of education: Not on file  . Highest education level: 12th grade  Occupational History  . Not on file  Tobacco Use  . Smoking status: Never Smoker  . Smokeless tobacco: Never Used  Vaping Use  . Vaping Use: Never used  Substance and Sexual Activity  . Alcohol use: No  . Drug use: No  . Sexual activity: Yes    Birth control/protection: Surgical  Other Topics Concern  . Not on file  Social History Narrative  . Not on file   Social Determinants of Health   Financial Resource Strain: Low Risk   . Difficulty of Paying Living Expenses: Not very hard  Food Insecurity: No Food Insecurity  . Worried About Charity fundraiser in the Last Year: Never true  . Ran Out of Food in the Last Year: Never true  Transportation Needs: No Transportation Needs  . Lack of Transportation (Medical): No  . Lack of Transportation (Non-Medical): No  Physical Activity:  Insufficiently Active  . Days of Exercise per Week: 4 days  . Minutes of Exercise per Session: 30 min  Stress: No Stress Concern Present  . Feeling of Stress : Not at all  Social Connections: Moderately Integrated  . Frequency of Communication with Friends and Family: More than three times a week  . Frequency of Social Gatherings with Friends and Family: Once a week  . Attends Religious Services: 1 to 4 times per year  . Active Member of Clubs or Organizations: No  . Attends Archivist Meetings: Never  . Marital Status: Married  Human resources officer Violence: Not At Risk  . Fear of Current or Ex-Partner: No  . Emotionally Abused: No  . Physically Abused: No  . Sexually Abused: No     Outpatient Medications Prior to Visit  Medication Sig Dispense Refill  . acetaminophen (TYLENOL) 500 MG tablet Take 500 mg by mouth every 6 (six) hours as needed for moderate pain.     Marland Kitchen alendronate (FOSAMAX) 35 MG tablet Take 1 tablet (35 mg total) by mouth every 7 (seven) days. Take with a full glass of water on an empty stomach. 5 tablet 5  . amLODipine (NORVASC) 5 MG tablet Take 1 tablet (5 mg total) by mouth daily. 30 tablet 3  . aspirin EC 81 MG tablet Take 81 mg by mouth daily.    . Calcium Carb-Cholecalciferol 1000-800 MG-UNIT TABS Take 1 tablet by mouth 3 (three) times daily.     . Cholecalciferol (VITAMIN D) 50 MCG (2000 UT) CAPS Take 1 capsule by mouth daily.    . clonazePAM (KLONOPIN) 1 MG tablet Take 1 tablet (1 mg total) by mouth 2 (two) times daily as needed for anxiety. 62 tablet 5  . ferrous sulfate 325 (65 FE) MG tablet Take 325 mg by mouth daily with breakfast.    . levETIRAcetam (KEPPRA) 500 MG tablet Take 1,000 mg by mouth 2 (two) times daily. Two tablets twice daily 120 each   . MAGNESIUM PO Take 500 mg by mouth daily.     . mirtazapine (REMERON) 30 MG tablet TAKE ONE TABLET BY MOUTH AT BEDTIME 90 tablet 1  . Multiple Vitamin (MULTIVITAMIN) tablet Take 1 tablet by mouth daily.    Marland Kitchen PHENobarbital (LUMINAL) 97.2 MG tablet Take 97.2 mg by mouth 2 (two) times daily.    . Potassium 99 MG TABS Take 99 mg by mouth daily.     Marland Kitchen spironolactone (ALDACTONE) 25 MG tablet Take 1 tablet (25 mg total) by mouth daily. 30 tablet 3  . zinc gluconate 50 MG tablet Take 50 mg by mouth daily.     No facility-administered medications prior to visit.    Allergies  Allergen Reactions  . Aspirin     Allergic to non coated ASA  . Singulair [Montelukast Sodium] Other (See Comments)    Became sweaty and nuaseated    ROS Review of Systems  Constitutional: Negative for chills and fever.  HENT: Negative for congestion, sinus pressure, sinus pain and sore throat.   Eyes: Negative for  pain and discharge.  Respiratory: Negative for cough and shortness of breath.   Cardiovascular: Negative for chest pain and palpitations.  Gastrointestinal: Negative for abdominal pain, constipation, diarrhea, nausea and vomiting.  Endocrine: Negative for polydipsia and polyuria.  Genitourinary: Negative for dysuria and hematuria.  Musculoskeletal: Negative for neck pain and neck stiffness.  Skin: Negative for rash.  Neurological: Positive for tremors and seizures. Negative for dizziness and weakness.  Psychiatric/Behavioral:  Negative for agitation and behavioral problems.      Objective:    Physical Exam Vitals reviewed.  Constitutional:      General: She is not in acute distress.    Appearance: She is not diaphoretic.  HENT:     Head: Normocephalic and atraumatic.     Nose: Nose normal. No congestion.     Mouth/Throat:     Mouth: Mucous membranes are moist.     Pharynx: No posterior oropharyngeal erythema.  Eyes:     General: No scleral icterus.    Extraocular Movements: Extraocular movements intact.     Pupils: Pupils are equal, round, and reactive to light.  Cardiovascular:     Rate and Rhythm: Normal rate and regular rhythm.     Pulses: Normal pulses.     Heart sounds: Normal heart sounds. No murmur heard.   Pulmonary:     Breath sounds: Normal breath sounds. No wheezing or rales.  Musculoskeletal:     Cervical back: Neck supple. No tenderness.     Right lower leg: No edema.     Left lower leg: No edema.  Skin:    General: Skin is warm.     Findings: No rash.     Comments: Left thumbnail crusted  Neurological:     General: No focal deficit present.     Mental Status: She is alert and oriented to person, place, and time.     Sensory: No sensory deficit.     Motor: No weakness.  Psychiatric:        Mood and Affect: Mood normal.        Behavior: Behavior normal.     BP 138/81 (BP Location: Right Arm, Patient Position: Sitting, Cuff Size: Normal)   Pulse 75    Resp 18   Ht 5\' 2"  (1.575 m)   Wt 131 lb 1.9 oz (59.5 kg)   SpO2 98%   BMI 23.98 kg/m  Wt Readings from Last 3 Encounters:  03/26/20 131 lb 1.9 oz (59.5 kg)  03/05/20 128 lb (58.1 kg)  01/15/20 128 lb (58.1 kg)     There are no preventive care reminders to display for this patient.  There are no preventive care reminders to display for this patient.  Lab Results  Component Value Date   TSH 1.98 11/29/2019   Lab Results  Component Value Date   WBC 4.6 05/15/2019   HGB 13.4 05/15/2019   HCT 39.9 05/15/2019   MCV 96.8 05/15/2019   PLT 381 05/15/2019   Lab Results  Component Value Date   NA 140 02/27/2020   K 4.2 02/27/2020   CO2 26 02/27/2020   GLUCOSE 78 02/27/2020   BUN 4 (L) 02/27/2020   CREATININE 0.51 (L) 02/27/2020   BILITOT 0.2 05/23/2019   ALKPHOS 136 (H) 01/07/2016   AST 34 05/23/2019   ALT 23 05/23/2019   PROT 7.6 05/23/2019   ALBUMIN 4.3 01/07/2016   CALCIUM 9.6 02/27/2020   ANIONGAP 8 02/14/2019   Lab Results  Component Value Date   CHOL 168 05/15/2019   Lab Results  Component Value Date   HDL 60 05/15/2019   Lab Results  Component Value Date   LDLCALC 86 05/15/2019   Lab Results  Component Value Date   TRIG 123 05/15/2019   Lab Results  Component Value Date   CHOLHDL 2.8 05/15/2019   Lab Results  Component Value Date   HGBA1C 5.6 02/27/2020      Assessment & Plan:  Problem List Items Addressed This Visit      Cardiovascular and Mediastinum   Essential hypertension - Primary BP Readings from Last 1 Encounters:  03/26/20 138/81   Well-controlled Counseled for compliance with the medications Advised DASH diet and moderate exercise/walking, at least 150 mins/week      Nervous and Auditory   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   Follow-up: Return in about 3 months (around 06/23/2020).    Lindell Spar, MD

## 2020-03-26 NOTE — Patient Instructions (Signed)
Please apply Lamisil cream to left thumb nail.  Please continue to take medications as prescribed.  Please contact Dr Freddie Apley office to discuss about shaking sensation and let them know about episode of seizure.

## 2020-04-09 ENCOUNTER — Other Ambulatory Visit: Payer: Self-pay | Admitting: Family Medicine

## 2020-05-30 ENCOUNTER — Other Ambulatory Visit: Payer: Self-pay | Admitting: Family Medicine

## 2020-06-25 ENCOUNTER — Other Ambulatory Visit: Payer: Self-pay

## 2020-06-25 ENCOUNTER — Encounter: Payer: Self-pay | Admitting: Family Medicine

## 2020-06-25 ENCOUNTER — Ambulatory Visit (INDEPENDENT_AMBULATORY_CARE_PROVIDER_SITE_OTHER): Payer: 59 | Admitting: Family Medicine

## 2020-06-25 VITALS — BP 148/90 | HR 76 | Temp 98.0°F | Ht 62.0 in | Wt 137.0 lb

## 2020-06-25 DIAGNOSIS — R569 Unspecified convulsions: Secondary | ICD-10-CM | POA: Diagnosis not present

## 2020-06-25 DIAGNOSIS — E559 Vitamin D deficiency, unspecified: Secondary | ICD-10-CM | POA: Diagnosis not present

## 2020-06-25 DIAGNOSIS — M810 Age-related osteoporosis without current pathological fracture: Secondary | ICD-10-CM

## 2020-06-25 DIAGNOSIS — G4700 Insomnia, unspecified: Secondary | ICD-10-CM

## 2020-06-25 DIAGNOSIS — I10 Essential (primary) hypertension: Secondary | ICD-10-CM

## 2020-06-25 DIAGNOSIS — G40919 Epilepsy, unspecified, intractable, without status epilepticus: Secondary | ICD-10-CM

## 2020-06-25 DIAGNOSIS — Z13220 Encounter for screening for lipoid disorders: Secondary | ICD-10-CM

## 2020-06-25 DIAGNOSIS — R7301 Impaired fasting glucose: Secondary | ICD-10-CM

## 2020-06-25 DIAGNOSIS — R251 Tremor, unspecified: Secondary | ICD-10-CM

## 2020-06-25 MED ORDER — SPIRONOLACTONE 50 MG PO TABS: 50.0000 mg | ORAL_TABLET | Freq: Every day | ORAL | 3 refills | Status: DC

## 2020-06-25 NOTE — Patient Instructions (Addendum)
Annual exam with pap and re evaluate blood pressure in 6 to 8 weeks, call if yyou need me before  New higher dose of spironolactone is 50 mg one  Daily ( if you do not get this today , then take TWo 25 mg tablets)   Labs today, CBC, lipid, phenobarb level and vit D and chem 7 and EGFR  It is important that you exercise regularly at least 30 minutes 5 times a week. If you develop chest pain, have severe difficulty breathing, or feel very tired, stop exercising immediately and seek medical attention   Thanks for choosing [facility], we consider it a privelige to serve you.

## 2020-06-26 LAB — CBC WITH DIFFERENTIAL
Basophils Absolute: 0.1 10*3/uL (ref 0.0–0.2)
Basos: 1 %
EOS (ABSOLUTE): 0.1 10*3/uL (ref 0.0–0.4)
Eos: 1 %
Hematocrit: 40.2 % (ref 34.0–46.6)
Hemoglobin: 13.6 g/dL (ref 11.1–15.9)
Immature Grans (Abs): 0 10*3/uL (ref 0.0–0.1)
Immature Granulocytes: 0 %
Lymphocytes Absolute: 2.9 10*3/uL (ref 0.7–3.1)
Lymphs: 53 %
MCH: 32.1 pg (ref 26.6–33.0)
MCHC: 33.8 g/dL (ref 31.5–35.7)
MCV: 95 fL (ref 79–97)
Monocytes Absolute: 0.8 10*3/uL (ref 0.1–0.9)
Monocytes: 14 %
Neutrophils Absolute: 1.7 10*3/uL (ref 1.4–7.0)
Neutrophils: 31 %
RBC: 4.24 x10E6/uL (ref 3.77–5.28)
RDW: 11.1 % — ABNORMAL LOW (ref 11.7–15.4)
WBC: 5.5 10*3/uL (ref 3.4–10.8)

## 2020-06-26 LAB — BASIC METABOLIC PANEL
BUN/Creatinine Ratio: 13 (ref 12–28)
BUN: 8 mg/dL (ref 8–27)
CO2: 25 mmol/L (ref 20–29)
Calcium: 10.2 mg/dL (ref 8.7–10.3)
Chloride: 97 mmol/L (ref 96–106)
Creatinine, Ser: 0.63 mg/dL (ref 0.57–1.00)
Glucose: 87 mg/dL (ref 65–99)
Potassium: 4.1 mmol/L (ref 3.5–5.2)
Sodium: 136 mmol/L (ref 134–144)
eGFR: 101 mL/min/{1.73_m2} (ref >59)

## 2020-06-26 LAB — LIPID PANEL
Chol/HDL Ratio: 2.7 ratio (ref 0.0–4.4)
Cholesterol, Total: 165 mg/dL (ref 100–199)
HDL: 62 mg/dL (ref >39)
LDL Chol Calc (NIH): 84 mg/dL (ref 0–99)
Triglycerides: 103 mg/dL (ref 0–149)
VLDL Cholesterol Cal: 19 mg/dL (ref 5–40)

## 2020-06-26 LAB — PHENOBARBITAL LEVEL: Phenobarbital, Serum: 34 ug/mL (ref 15–40)

## 2020-06-26 LAB — VITAMIN D 25 HYDROXY (VIT D DEFICIENCY, FRACTURES): Vit D, 25-Hydroxy: 73.8 ng/mL (ref 30.0–100.0)

## 2020-06-27 ENCOUNTER — Encounter: Payer: Self-pay | Admitting: Family Medicine

## 2020-06-27 NOTE — Assessment & Plan Note (Signed)
Continue current meds . Daily weight bearing activity encouraged . rept dexa in 2023

## 2020-06-27 NOTE — Assessment & Plan Note (Signed)
Uncontrolled , increase dose of spironolactone DASH diet and commitment to daily physical activity for a minimum of 30 minutes discussed and encouraged, as a part of hypertension management. The importance of attaining a healthy weight is also discussed.  BP/Weight 06/25/2020 03/26/2020 03/05/2020 01/15/2020 11/26/2019 05/23/2019 09/25/1896  Systolic BP 421 031 - 281 188 677 373  Diastolic BP 90 81 - 80 80 80 82  Wt. (Lbs) 137 131.12 128 128 133 137 137.4  BMI 25.06 23.98 23.41 23.41 24.33 25.06 25.13

## 2020-06-27 NOTE — Progress Notes (Signed)
   Sandra Riley     MRN: [id number]      DOB: [birth year]   HPI Sandra Riley is here for follow up and re-evaluation of chronic medical conditions, medication management and review of any available recent lab and radiology data.  Preventive health is updated, specifically  Cancer screening and Immunization.   Questions or concerns regarding consultations or procedures which the PT has had in the interim are  addressed. The PT denies any adverse reactions to current medications since the last visit.  There are no new concerns.  There are no specific complaints   ROS Denies recent fever or chills. Denies sinus pressure, nasal congestion, ear pain or sore throat. Denies chest congestion, productive cough or wheezing. Denies chest pains, palpitations and leg swelling Denies abdominal pain, nausea, vomiting,diarrhea or constipation.   Denies dysuria, frequency, hesitancy or incontinence. Denies joint pain, swelling and limitation in mobility. Denies headaches, seizures, numbness, or tingling. Denies depression, anxiety or insomnia. Denies skin break down or rash.   PE  BP (!) 148/90 (BP Location: Left Arm, Patient Position: Sitting, Cuff Size: Normal)   Pulse 76   Temp 98 F (36.7 C) (Temporal)   Ht 5\' 2"  (1.575 m)   Wt 137 lb (62.1 kg)   SpO2 97%   BMI 25.06 kg/m   Patient alert and oriented and in no cardiopulmonary distress.  HEENT: No facial asymmetry, EOMI,     Neck supple .  Chest: Clear to auscultation bilaterally.  CVS: S1, S2 no murmurs, no S3.Regular rate.  ABD: Soft non tender.   Ext: No edema  MS: Adequate ROM spine, shoulders, hips and knees.  Skin: Intact, no ulcerations or rash noted.  Psych: Good eye contact, normal affect. Memory intact not anxious or depressed appearing.  CNS: CN 2-12 intact, power,  normal throughout.no focal deficits noted.   Assessment & Plan  Essential hypertension Uncontrolled , increase dose of spironolactone DASH  diet and commitment to daily physical activity for a minimum of 30 minutes discussed and encouraged, as a part of hypertension management. The importance of attaining a healthy weight is also discussed.  BP/Weight 06/25/2020 03/26/2020 03/05/2020 01/15/2020 11/26/2019 05/23/2019 8/46/6599  Systolic BP 357 017 - 793 903 009 233  Diastolic BP 90 81 - 80 80 80 82  Wt. (Lbs) 137 131.12 128 128 133 137 137.4  BMI 25.06 23.98 23.41 23.41 24.33 25.06 25.13       Convulsions Controlled, no change in medication   Mixed insomnia Controlled, no change in medication Sleep hygiene reviewed and written information offered also. Prescription sent for  medication needed.   Osteoporosis without current pathological fracture Continue current meds . Daily weight bearing activity encouraged . rept dexa in 2023

## 2020-06-27 NOTE — Assessment & Plan Note (Signed)
Controlled, no change in medication Sleep hygiene reviewed and written information offered also. Prescription sent for  medication needed.  

## 2020-06-27 NOTE — Assessment & Plan Note (Signed)
Controlled, no change in medication  

## 2020-07-09 ENCOUNTER — Telehealth: Payer: Self-pay

## 2020-07-09 NOTE — Telephone Encounter (Signed)
Aware to yes get her booster

## 2020-07-09 NOTE — Telephone Encounter (Signed)
Patient called asking does Dr Moshe Cipro recommend getting the 2nd booster shot.  Call back # [phone].

## 2020-07-17 ENCOUNTER — Ambulatory Visit (HOSPITAL_COMMUNITY)
Admission: RE | Admit: 2020-07-17 | Discharge: 2020-07-17 | Disposition: A | Discharge status: Home / Self Care | Payer: 59 | Source: Ambulatory Visit | Attending: Family Medicine | Admitting: Family Medicine

## 2020-07-17 ENCOUNTER — Other Ambulatory Visit: Payer: Self-pay

## 2020-07-17 ENCOUNTER — Ambulatory Visit (HOSPITAL_COMMUNITY): Payer: 59

## 2020-07-17 DIAGNOSIS — Z1231 Encounter for screening mammogram for malignant neoplasm of breast: Secondary | ICD-10-CM | POA: Insufficient documentation

## 2020-08-06 ENCOUNTER — Other Ambulatory Visit: Payer: Self-pay | Admitting: Family Medicine

## 2020-08-12 ENCOUNTER — Encounter: Payer: 59 | Admitting: Family Medicine

## 2020-08-22 ENCOUNTER — Other Ambulatory Visit: Payer: Self-pay | Admitting: Family Medicine

## 2020-10-08 ENCOUNTER — Other Ambulatory Visit: Payer: Self-pay | Admitting: Family Medicine

## 2020-10-08 DIAGNOSIS — M810 Age-related osteoporosis without current pathological fracture: Secondary | ICD-10-CM

## 2020-10-21 ENCOUNTER — Other Ambulatory Visit (HOSPITAL_COMMUNITY)
Admission: RE | Admit: 2020-10-21 | Discharge: 2020-10-21 | Disposition: A | Discharge status: Home / Self Care | Payer: 59 | Source: Ambulatory Visit | Attending: Family Medicine | Admitting: Family Medicine

## 2020-10-21 ENCOUNTER — Other Ambulatory Visit: Payer: Self-pay

## 2020-10-21 ENCOUNTER — Encounter: Payer: Self-pay | Admitting: Family Medicine

## 2020-10-21 ENCOUNTER — Ambulatory Visit (INDEPENDENT_AMBULATORY_CARE_PROVIDER_SITE_OTHER): Payer: 59 | Admitting: Family Medicine

## 2020-10-21 VITALS — BP 136/81 | HR 70 | Resp 16 | Ht 62.0 in | Wt 127.0 lb

## 2020-10-21 DIAGNOSIS — Z0000 Encounter for general adult medical examination without abnormal findings: Secondary | ICD-10-CM

## 2020-10-21 DIAGNOSIS — Z1151 Encounter for screening for human papillomavirus (HPV): Secondary | ICD-10-CM | POA: Diagnosis not present

## 2020-10-21 DIAGNOSIS — Z01419 Encounter for gynecological examination (general) (routine) without abnormal findings: Secondary | ICD-10-CM | POA: Diagnosis present

## 2020-10-21 DIAGNOSIS — R569 Unspecified convulsions: Secondary | ICD-10-CM

## 2020-10-21 DIAGNOSIS — Z124 Encounter for screening for malignant neoplasm of cervix: Secondary | ICD-10-CM | POA: Diagnosis not present

## 2020-10-21 DIAGNOSIS — I10 Essential (primary) hypertension: Secondary | ICD-10-CM | POA: Diagnosis not present

## 2020-10-21 MED ORDER — SPIRONOLACTONE 50 MG PO TABS: 50.0000 mg | ORAL_TABLET | Freq: Every day | ORAL | 3 refills | Status: DC

## 2020-10-21 NOTE — Patient Instructions (Addendum)
F/U in 5 months, call if you  need me sooner  Pap sent   TSh, chem 7 and EGFr and phenobarbitol level today  You need td AP at your pharmacy  Thanks for choosing [facility], we consider it a privelige to serve you.

## 2020-10-21 NOTE — Progress Notes (Signed)
    Sandra Riley     MRN: [id number]      DOB: 1960/10/01  HPI: Patient is in for annual physical exam. No other health concerns are expressed or addressed at the visit. Recent labs,  are reviewed. Immunization is reviewed , and  updated if needed.   PE: BP 136/81   Pulse 70   Resp 16   Ht 5\' 2"  (1.575 m)   Wt 127 lb (57.6 kg)   SpO2 97%   BMI 23.23 kg/m   Pleasant  female, alert and oriented x 3, in no cardio-pulmonary distress. Afebrile. HEENT No facial trauma or asymetry. Sinuses non tender.  Extra occullar muscles intact.. External ears normal, . Neck: supple, no adenopathy,JVD or thyromegaly.No bruits.  Chest: Clear to ascultation bilaterally.No crackles or wheezes. Non tender to palpation  Breast: No asymetry,no masses or lumps. No tenderness. No nipple discharge or inversion. No axillary or supraclavicular adenopathy  Cardiovascular system; Heart sounds normal,  S1 and  S2 ,no S3.  No murmur, or thrill. Apical beat not displaced Peripheral pulses normal.  Abdomen: Soft, non tender, no organomegaly or masses. No bruits. Bowel sounds normal. No guarding, tenderness or rebound.   GU: External genitalia normal female genitalia , normal female distribution of hair. No lesions. Urethral meatus normal in size, no  Prolapse, no lesions visibly  Present. Bladder non tender. Vagina pink and moist , with no visible lesions , discharge present . Adequate pelvic support no  cystocele or rectocele noted  Uterus absent , no adnexal masses, no  adnexal tenderness.   Musculoskeletal exam: Full ROM of spine, hips , shoulders and knees. No deformity ,swelling or crepitus noted. No muscle wasting or atrophy.   Neurologic: Cranial nerves 2 to 12 intact. Power, tone ,sensation and reflexes normal throughout. No disturbance in gait. No tremor.  Skin: Intact, no ulceration, erythema , scaling or rash noted. Pigmentation normal throughout  Psych; Normal  mood and affect. Judgement and concentration normal   Assessment & Plan:  Annual physical exam Annual exam as documented. Counseling done  re healthy lifestyle involving commitment to 150 minutes exercise per week, heart healthy diet, and attaining healthy weight.The importance of adequate sleep also discussed. Regular seat belt use and home safety, is also discussed. Changes in health habits are decided on by the patient with goals and time frames  set for achieving them. Immunization and cancer screening needs are specifically addressed at this visit.'   Essential hypertension Controlled, no change in medication   DASH diet and commitment to daily physical activity for a minimum of 30 minutes discussed and encouraged, as a part of hypertension management. The importance of attaining a healthy weight is also discussed.  BP/Weight 10/21/2020 06/25/2020 03/26/2020 03/05/2020 01/15/2020 11/26/2019 05/10/8117  Systolic BP 147 829 562 - 130 865 784  Diastolic BP 81 90 81 - 80 80 80  Wt. (Lbs) 127 137 131.12 128 128 133 137  BMI 23.23 25.06 23.98 23.41 23.41 24.33 25.06

## 2020-10-21 NOTE — Assessment & Plan Note (Signed)

## 2020-10-21 NOTE — Assessment & Plan Note (Signed)
Controlled, no change in medication   DASH diet and commitment to daily physical activity for a minimum of 30 minutes discussed and encouraged, as a part of hypertension management. The importance of attaining a healthy weight is also discussed.  BP/Weight 10/21/2020 06/25/2020 03/26/2020 03/05/2020 01/15/2020 11/26/2019 1/98/2429  Systolic BP 980 699 967 - 227 737 505  Diastolic BP 81 90 81 - 80 80 80  Wt. (Lbs) 127 137 131.12 128 128 133 137  BMI 23.23 25.06 23.98 23.41 23.41 24.33 25.06

## 2020-10-22 LAB — BMP8+EGFR
BUN/Creatinine Ratio: 6 — ABNORMAL LOW (ref 12–28)
BUN: 4 mg/dL — ABNORMAL LOW (ref 8–27)
CO2: 22 mmol/L (ref 20–29)
Calcium: 10.1 mg/dL (ref 8.7–10.3)
Chloride: 101 mmol/L (ref 96–106)
Creatinine, Ser: 0.66 mg/dL (ref 0.57–1.00)
Glucose: 87 mg/dL (ref 65–99)
Potassium: 4 mmol/L (ref 3.5–5.2)
Sodium: 142 mmol/L (ref 134–144)
eGFR: 99 mL/min/{1.73_m2} (ref >59)

## 2020-10-22 LAB — PHENOBARBITAL LEVEL: Phenobarbital, Serum: 32 ug/mL (ref 15–40)

## 2020-10-22 LAB — TSH: TSH: 1.17 u[IU]/mL (ref 0.450–4.500)

## 2020-10-24 LAB — CYTOLOGY - PAP
Comment: NEGATIVE
Diagnosis: HIGH — AB
High risk HPV: NEGATIVE

## 2020-10-29 ENCOUNTER — Other Ambulatory Visit: Payer: Self-pay | Admitting: Family Medicine

## 2020-10-29 DIAGNOSIS — R87620 Atypical squamous cells of undetermined significance on cytologic smear of vagina (ASC-US): Secondary | ICD-10-CM

## 2020-11-13 ENCOUNTER — Other Ambulatory Visit: Payer: Self-pay

## 2020-11-13 ENCOUNTER — Ambulatory Visit (INDEPENDENT_AMBULATORY_CARE_PROVIDER_SITE_OTHER): Payer: 59 | Admitting: Obstetrics & Gynecology

## 2020-11-13 ENCOUNTER — Encounter: Payer: Self-pay | Admitting: Obstetrics & Gynecology

## 2020-11-13 VITALS — BP 129/81 | HR 82 | Ht 62.0 in | Wt 132.0 lb

## 2020-11-13 DIAGNOSIS — R87620 Atypical squamous cells of undetermined significance on cytologic smear of vagina (ASC-US): Secondary | ICD-10-CM

## 2020-11-13 NOTE — Progress Notes (Signed)
     Colposcopy Procedure Note:    Colposcopy Procedure Note  Indications:  2022 ASCUS cannot rule out HSIL negative HPV (Had hysterectomy in the 90s for bleeding no cervical cancer)   2019 ASCCP recommendation: vaginal colposcopy  Smoker:  No. New sexual partner:  No.    History of abnormal Pap: no  Procedure Details  The risks and benefits of the procedure and Written informed consent obtained.  Speculum placed in vagina and excellent visualization of cervix achieved, cervix swabbed x 3 with acetic acid solution.  Findings: Adequate colposcopy is noted today.  Cervix: surgically absent; . Vaginal inspection: atrophic changes, no dysplatic changes noted no masses or other lesions. Vulvar colposcopy: vulvar colposcopy not performed.  Specimens: none  Complications: none.  Colposcopic Impression:   ICD-10-CM   1. Atypical squamous cell changes of undetermined significance (ASCUS) on vaginal cytology, cannot rule out HSIL  R87.620    colpo normal with severe atrophic changes, pt does not need future cytologic evaluation       Plan(Based on 2019 ASCCP recommendations) No further cytological or HPV testing of the vagina is indicated

## 2021-01-01 ENCOUNTER — Other Ambulatory Visit: Payer: Self-pay | Admitting: Family Medicine

## 2021-01-28 ENCOUNTER — Ambulatory Visit (INDEPENDENT_AMBULATORY_CARE_PROVIDER_SITE_OTHER): Payer: 59

## 2021-01-28 ENCOUNTER — Other Ambulatory Visit: Payer: Self-pay

## 2021-01-28 DIAGNOSIS — Z0000 Encounter for general adult medical examination without abnormal findings: Secondary | ICD-10-CM | POA: Diagnosis not present

## 2021-01-28 NOTE — Patient Instructions (Addendum)
Ms. Gunner , Thank you for taking time to come for your Medicare Wellness Visit. I appreciate your ongoing commitment to your health goals. Please review the following plan we discussed and let me know if I can assist you in the future.   These are the goals we discussed:  Goals      Exercise 3x per week (30 min per time)     Recommend increasing your exercise routine at least 3 days a week for 30-45 minutes at a time as tolerated.          This is a list of the screening recommended for you and due dates:  Health Maintenance  Topic Date Due   Pneumococcal Vaccination (3 - PPSV23 if available, else PCV20) 03/13/2015   Tetanus Vaccine  09/13/2020   COVID-19 Vaccine (5 - Booster for Moderna series) 12/22/2020   Mammogram  07/18/2022   Pap Smear  10/22/2023   Colon Cancer Screening  02/14/2029   Flu Shot  Completed   Hepatitis C Screening: USPSTF Recommendation to screen - Ages 18-79 yo.  Completed   HIV Screening  Completed   Zoster (Shingles) Vaccine  Completed   HPV Vaccine  Aged Out    Health Maintenance, Female Adopting a healthy lifestyle and getting preventive care are important in promoting health and wellness. Ask your health care provider about: The right schedule for you to have regular tests and exams. Things you can do on your own to prevent diseases and keep yourself healthy. What should I know about diet, weight, and exercise? Eat a healthy diet  Eat a diet that includes plenty of vegetables, fruits, low-fat dairy products, and lean protein. Do not eat a lot of foods that are high in solid fats, added sugars, or sodium. Maintain a healthy weight Body mass index (BMI) is used to identify weight problems. It estimates body fat based on height and weight. Your health care provider can help determine your BMI and help you achieve or maintain a healthy weight. Get regular exercise Get regular exercise. This is one of the most important things you can do for your  health. Most adults should: Exercise for at least 150 minutes each week. The exercise should increase your heart rate and make you sweat (moderate-intensity exercise). Do strengthening exercises at least twice a week. This is in addition to the moderate-intensity exercise. Spend less time sitting. Even light physical activity can be beneficial. Watch cholesterol and blood lipids Have your blood tested for lipids and cholesterol at 63 years of age, then have this test every 5 years. Have your cholesterol levels checked more often if: Your lipid or cholesterol levels are high. You are older than 62 years of age. You are at high risk for heart disease. What should I know about cancer screening? Depending on your health history and family history, you may need to have cancer screening at various ages. This may include screening for: Breast cancer. Cervical cancer. Colorectal cancer. Skin cancer. Lung cancer. What should I know about heart disease, diabetes, and high blood pressure? Blood pressure and heart disease High blood pressure causes heart disease and increases the risk of stroke. This is more likely to develop in people who have high blood pressure readings or are overweight. Have your blood pressure checked: Every 3-5 years if you are 58-20 years of age. Every year if you are 49 years old or older. Diabetes Have regular diabetes screenings. This checks your fasting blood sugar level. Have the screening  done: Once every three years after age 50 if you are at a normal weight and have a low risk for diabetes. More often and at a younger age if you are overweight or have a high risk for diabetes. What should I know about preventing infection? Hepatitis B If you have a higher risk for hepatitis B, you should be screened for this virus. Talk with your health care provider to find out if you are at risk for hepatitis B infection. Hepatitis C Testing is recommended for: Everyone born  from [birth year] through 1967. Anyone with known risk factors for hepatitis C. Sexually transmitted infections (STIs) Get screened for STIs, including gonorrhea and chlamydia, if: You are sexually active and are younger than 62 years of age. You are older than 62 years of age and your health care provider tells you that you are at risk for this type of infection. Your sexual activity has changed since you were last screened, and you are at increased risk for chlamydia or gonorrhea. Ask your health care provider if you are at risk. Ask your health care provider about whether you are at high risk for HIV. Your health care provider may recommend a prescription medicine to help prevent HIV infection. If you choose to take medicine to prevent HIV, you should first get tested for HIV. You should then be tested every 3 months for as long as you are taking the medicine. Pregnancy If you are about to stop having your period (premenopausal) and you may become pregnant, seek counseling before you get pregnant. Take 400 to 800 micrograms (mcg) of folic acid every day if you become pregnant. Ask for birth control (contraception) if you want to prevent pregnancy. Osteoporosis and menopause Osteoporosis is a disease in which the bones lose minerals and strength with aging. This can result in bone fractures. If you are 67 years old or older, or if you are at risk for osteoporosis and fractures, ask your health care provider if you should: Be screened for bone loss. Take a calcium or vitamin D supplement to lower your risk of fractures. Be given hormone replacement therapy (HRT) to treat symptoms of menopause. Follow these instructions at home: Alcohol use Do not drink alcohol if: Your health care provider tells you not to drink. You are pregnant, may be pregnant, or are planning to become pregnant. If you drink alcohol: Limit how much you have to: 0-1 drink a day. Know how much alcohol is in your drink. In the  [place] one drink equals one 12 oz bottle of beer (355 mL), one 5 oz glass of wine (148 mL), or one 1 oz glass of hard liquor (44 mL). Lifestyle Do not use any products that contain nicotine or tobacco. These products include cigarettes, chewing tobacco, and vaping devices, such as e-cigarettes. If you need help quitting, ask your health care provider. Do not use street drugs. Do not share needles. Ask your health care provider for help if you need support or information about quitting drugs. General instructions Schedule regular health, dental, and eye exams. Stay current with your vaccines. Tell your health care provider if: You often feel depressed. You have ever been abused or do not feel safe at home. Summary Adopting a healthy lifestyle and getting preventive care are important in promoting health and wellness. Follow your health care provider's instructions about healthy diet, exercising, and getting tested or screened for diseases. Follow your health care provider's instructions on monitoring your cholesterol and blood pressure. This  information is not intended to replace advice given to you by your health care provider. Make sure you discuss any questions you have with your health care provider. Document Revised: 06/09/2020 Document Reviewed: 06/09/2020 Elsevier Patient Education  [place].

## 2021-01-28 NOTE — Progress Notes (Signed)
Subjective:   Sandra Riley is a 60 y.o. female who presents for Medicare Annual (Subsequent) preventive examination. I connected with  Shernell Saldierna on 01/28/21 by a audio enabled telemedicine application and verified that I am speaking with the correct person using two identifiers.  Patient Location: Home  Provider Location: Office/Clinic  I discussed the limitations of evaluation and management by telemedicine. The patient expressed understanding and agreed to proceed.  Review of Systems    Defer  to PCP Cardiac Risk Factors include: hypertension     Objective:    There were no vitals filed for this visit. There is no height or weight on file to calculate BMI.  Advanced Directives 01/28/2021 01/24/2020 02/15/2019 02/14/2019 09/19/2017 09/20/2016  Does Patient Have a Medical Advance Directive? No No No No Yes;No No  Does patient want to make changes to medical advance directive? - - - - Yes (ED - Information included in AVS) -  Would patient like information on creating a medical advance directive? Yes (ED - Information included in AVS) No - Patient declined No - Patient declined No - Patient declined - Yes (MAU/Ambulatory/Procedural Areas - Information given)    Current Medications (verified) Outpatient Encounter Medications as of 01/28/2021  Medication Sig   acetaminophen (TYLENOL) 500 MG tablet Take 500 mg by mouth every 6 (six) hours as needed for moderate pain.    alendronate (FOSAMAX) 35 MG tablet Take 1 tablet (35 mg total) by mouth every 7 (seven) days. Take with a full glass of water on an empty stomach.   amLODipine (NORVASC) 5 MG tablet TAKE ONE TABLET BY MOUTH DAILY   aspirin EC 81 MG tablet Take 81 mg by mouth daily.   B Complex-C-Folic Acid (HM SUPER VITAMIN B COMPLEX/C PO) Take by mouth daily.   Calcium Carb-Cholecalciferol 1000-800 MG-UNIT TABS Take 1 tablet by mouth 3 (three) times daily.    Cholecalciferol (VITAMIN D) 50 MCG (2000 UT) CAPS Take 1  capsule by mouth daily.   clonazePAM (KLONOPIN) 1 MG tablet Take 1 tablet (1 mg total) by mouth 2 (two) times daily as needed for anxiety.   levETIRAcetam (KEPPRA) 500 MG tablet Take 1,000 mg by mouth 2 (two) times daily. Two tablets twice daily   MAGNESIUM PO Take 500 mg by mouth daily.    mirtazapine (REMERON) 30 MG tablet TAKE ONE TABLET BY MOUTH AT BEDTIME   Multiple Vitamin (MULTIVITAMIN) tablet Take 1 tablet by mouth daily.   PHENobarbital (LUMINAL) 97.2 MG tablet Take 97.2 mg by mouth 2 (two) times daily.   Potassium 99 MG TABS Take 99 mg by mouth daily.    spironolactone (ALDACTONE) 50 MG tablet Take 1 tablet (50 mg total) by mouth daily.   zinc gluconate 50 MG tablet Take 50 mg by mouth daily.   No facility-administered encounter medications on file as of 01/28/2021.    Allergies (verified) Aspirin and Singulair [montelukast sodium]   History: Past Medical History:  Diagnosis Date   Abnormal facial hair 05/11/2011   Anxiety and depression    GAD (generalized anxiety disorder)    Insomnia    Psychotic disorder (HCC)    Seizure disorder ([place])    stress related seizures. 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[facility])    Past Surgical History:  Procedure Laterality Date   ABDOMINAL HYSTERECTOMY     CESAREAN SECTION     CHOLECYSTECTOMY     COLONOSCOPY WITH PROPOFOL N/A 02/15/2019   Procedure:  COLONOSCOPY WITH PROPOFOL;  Surgeon: Daneil Dolin, MD;  [facility]: [facility];  Service: Endoscopy;  Laterality: N/A;  2:45pm   POLYPECTOMY  02/15/2019   Procedure: POLYPECTOMY;  Surgeon: Daneil Dolin, MD;  Location: [facility];  Service: Endoscopy;;   Family History  Problem Relation Age of Onset   Breast cancer Mother 23   Coronary artery disease Father    Stroke Father 35   Breast cancer Sister 36   Breast cancer Sister    Breast cancer Sister    Diabetes Brother    Heart attack Brother    Social History   Socioeconomic History   Marital status:  Married    Spouse name: Coralyn Mark   Number of children: 3   Years of education: 12th   Highest education level: 12th grade  Occupational History   Occupation: disabled  Tobacco Use   Smoking status: Never   Smokeless tobacco: Never  Vaping Use   Vaping Use: Never used  Substance and Sexual Activity   Alcohol use: No   Drug use: No   Sexual activity: Yes    Birth control/protection: Surgical  Other Topics Concern   Not on file  Social History Narrative   Not on file   Social Determinants of Health   Financial Resource Strain: Low Risk    Difficulty of Paying Living Expenses: Not hard at all  Food Insecurity: No Food Insecurity   Worried About Charity fundraiser in the Last Year: Never true   Ran Out of Food in the Last Year: Never true  Transportation Needs: No Transportation Needs   Lack of Transportation (Medical): No   Lack of Transportation (Non-Medical): No  Physical Activity: Insufficiently Active   Days of Exercise per Week: 3 days   Minutes of Exercise per Session: 30 min  Stress: No Stress Concern Present   Feeling of Stress : Only a little  Social Connections: Moderately Integrated   Frequency of Communication with Friends and Family: More than three times a week   Frequency of Social Gatherings with Friends and Family: Once a week   Attends Religious Services: More than 4 times per year   Active Member of [employer] or Organizations: No   Attends Music therapist: Never   Marital Status: Married    Tobacco Counseling Counseling given: Not Answered   Clinical Intake:  Pre-visit preparation completed: No  Pain : No/denies pain     Nutritional Risks: None Diabetes: No  How often do you need to have someone help you when you read instructions, pamphlets, or other written materials from your doctor or pharmacy?: 2 - Rarely What is the last grade level you completed in school?: 12th  Diabetic?no  Interpreter Needed?: No  Information entered  by :: [place] of Daily Living In your present state of health, do you have any difficulty performing the following activities: 01/28/2021  Hearing? N  Vision? N  Difficulty concentrating or making decisions? N  Walking or climbing stairs? N  Dressing or bathing? N  Doing errands, shopping? N  Preparing Food and eating ? Y  Using the Toilet? Y  In the past six months, have you accidently leaked urine? N  Do you have problems with loss of bowel control? N  Managing your Medications? Y  Managing your Finances? Y  Housekeeping or managing your Housekeeping? Y  Some recent data might be hidden    Patient Care Team: Fayrene Helper, MD as  PCP - General Phillips Odor, MD as Consulting Physician (Neurology)  Indicate any recent Medical Services you may have received from other than [facility] providers in the past year (date may be approximate).     Assessment:   This is a routine wellness examination for [facility].  Hearing/Vision screen No results found.  Dietary issues and exercise activities discussed: Current Exercise Habits: Home exercise routine, Type of exercise: walking, Time (Minutes): 20, Frequency (Times/Week): 4, Weekly Exercise (Minutes/Week): 80, Intensity: Mild   Goals Addressed   None   Depression Screen PHQ 2/9 Scores 01/28/2021 11/13/2020 10/21/2020 06/25/2020 03/26/2020 01/24/2020 01/24/2020  PHQ - 2 Score 0 0 0 0 0 0 0  PHQ- 9 Score - - - - - 1 1    Fall Risk Fall Risk  01/28/2021 10/21/2020 06/25/2020 03/26/2020 03/05/2020  Falls in the past year? 0 1 0 0 0  Number falls in past yr: 0 0 0 0 0  Injury with Fall? 0 1 0 0 0  Risk for fall due to : No Fall Risks - No Fall Risks No Fall Risks -  Follow up Falls evaluation completed - Falls evaluation completed Falls evaluation completed -    FALL RISK PREVENTION PERTAINING TO THE HOME:  Any stairs in or around the home? No  If so, are there any without handrails?  N/a Home free of loose throw rugs in  walkways, pet beds, electrical cords, etc? Yes  Adequate lighting in your home to reduce risk of falls? Yes   ASSISTIVE DEVICES UTILIZED TO PREVENT FALLS:  Life alert? No  Use of a cane, walker or w/c? No  Grab bars in the bathroom? Yes  Shower chair or bench in shower? Yes  Elevated toilet seat or a handicapped toilet? No      Cognitive Function:     6CIT Screen 01/28/2021 01/24/2020 09/21/2018 09/19/2017 09/20/2016  What Year? 0 points 0 points 0 points 0 points 0 points  What month? 0 points 0 points 0 points 0 points 0 points  What time? 0 points 0 points 0 points 0 points 0 points  Count back from 20 0 points 0 points 0 points 0 points 0 points  Months in reverse - 0 points 0 points 0 points 0 points  Repeat phrase 6 points 2 points 0 points 4 points 0 points  Total Score - 2 0 4 0    Immunizations Immunization History  Administered Date(s) Administered   H1N1 12/05/2007   Influenza Split 12/15/2010, 11/23/2011, 12/02/2014   Influenza Whole 12/20/2006, 12/10/2009   Influenza, High Dose Seasonal PF 10/13/2020   Influenza,inj,Quad PF,6+ Mos 12/04/2012   Influenza-Unspecified 11/06/2019   Moderna SARS-COV2 Booster Vaccination 07/22/2020   Moderna Sars-Covid-2 Vaccination 04/23/2019, 05/21/2019, 12/05/2019, 10/27/2020   Pneumococcal Conjugate-13 03/12/2014   Pneumococcal Polysaccharide-23 03/03/2009   Td 02/01/2001   Tdap 09/14/2010   Zoster Recombinat (Shingrix) 09/27/2018, 01/17/2019    TDAP status: Due, Education has been provided regarding the importance of this vaccine. Advised may receive this vaccine at local pharmacy or Health Dept. Aware to provide a copy of the vaccination record if obtained from local pharmacy or Health Dept. Verbalized acceptance and understanding.  Flu Vaccine status: Up to date  Pneumococcal vaccine status: Due, Education has been provided regarding the importance of this vaccine. Advised may receive this vaccine at local pharmacy or  Health Dept. Aware to provide a copy of the vaccination record if obtained from local pharmacy or Health Dept. Verbalized acceptance and understanding.  Covid-19 vaccine status: Information provided on how to obtain vaccines.   Qualifies for Shingles Vaccine? Yes   Zostavax completed No   Shingrix Completed?: Yes  Screening Tests Health Maintenance  Topic Date Due   Pneumococcal Vaccine 56-31 Years old (3 - PPSV23 if available, else PCV20) 03/13/2015   TETANUS/TDAP  09/13/2020   COVID-19 Vaccine (5 - Booster for Moderna series) 12/22/2020   MAMMOGRAM  07/18/2022   PAP SMEAR-Modifier  10/22/2023   COLONOSCOPY (Pts 45-18yrs Insurance coverage will need to be confirmed)  02/14/2029   INFLUENZA VACCINE  Completed   Hepatitis C Screening  Completed   HIV Screening  Completed   Zoster Vaccines- Shingrix  Completed   HPV VACCINES  Aged Out    Health Maintenance  Health Maintenance Due  Topic Date Due   Pneumococcal Vaccine 83-23 Years old (3 - PPSV23 if available, else PCV20) 03/13/2015   TETANUS/TDAP  09/13/2020   COVID-19 Vaccine (5 - Booster for Moderna series) 12/22/2020    Colorectal cancer screening: Type of screening: Colonoscopy. Completed 02/15/2019. Repeat every 10 years  Mammogram status: Completed 07/17/2020. Repeat every year  Bone Density status: Completed 01/05/2019. Results reflect: Bone density results: OSTEOPOROSIS. Repeat every 1 years.  Lung Cancer Screening: (Low Dose CT Chest recommended if Age 58-80 years, 30 pack-year currently smoking OR have quit w/in 15years.) does not qualify.   Lung Cancer Screening Referral: n/a  Additional Screening:  Hepatitis C Screening: does qualify; Completed 12/31/2014  Vision Screening: Recommended annual ophthalmology exams for early detection of glaucoma and other disorders of the eye. Is the patient up to date with their annual eye exam?   Pt states she [facility] exam at [facility] [place] Who is the provider or what  is the name of the office in which the patient attends annual eye exams? [facility] If pt is not established with a provider, would they like to be referred to a provider to establish care? No .   Dental Screening: Recommended annual dental exams for proper oral hygiene  Community Resource Referral / Chronic Care Management: CRR required this visit?  No   CCM required this visit?  No      Plan:     I have personally reviewed and noted the following in the patients chart:   Medical and social history Use of alcohol, tobacco or illicit drugs  Current medications and supplements including opioid prescriptions.  Functional ability and status Nutritional status Physical activity Advanced directives List of other physicians Hospitalizations, surgeries, and ER visits in previous 12 months Vitals Screenings to include cognitive, depression, and falls Referrals and appointments  In addition, I have reviewed and discussed with patient certain preventive protocols, quality metrics, and best practice recommendations. A written personalized care plan for preventive services as well as general preventive health recommendations were provided to patient.     Earline Mayotte, [place]   01/28/2021   Nurse Notes:  Ms. Dieterich , Thank you for taking time to come for your Medicare Wellness Visit. I appreciate your ongoing commitment to your health goals. Please review the following plan we discussed and let me know if I can assist you in the future.   These are the goals we discussed:  Goals      Exercise 3x per week (30 min per time)     Recommend increasing your exercise routine at least 3 days a week for 30-45 minutes at a time as tolerated.  This is a list of the screening recommended for you and due dates:  Health Maintenance  Topic Date Due   Pneumococcal Vaccination (3 - PPSV23 if available, else PCV20) 03/13/2015   Tetanus Vaccine  09/13/2020   COVID-19 Vaccine (5  - Booster for Moderna series) 12/22/2020   Mammogram  07/18/2022   Pap Smear  10/22/2023   Colon Cancer Screening  02/14/2029   Flu Shot  Completed   Hepatitis C Screening: USPSTF Recommendation to screen - Ages 18-79 yo.  Completed   HIV Screening  Completed   Zoster (Shingles) Vaccine  Completed   HPV Vaccine  Aged Out

## 2021-02-03 ENCOUNTER — Other Ambulatory Visit: Payer: Self-pay | Admitting: Family Medicine

## 2021-02-07 LAB — HM HEPATITIS C SCREENING LAB: HM Hepatitis Screen: NEGATIVE

## 2021-02-19 ENCOUNTER — Telehealth: Payer: Self-pay | Admitting: Family Medicine

## 2021-02-19 NOTE — Telephone Encounter (Signed)
Pls fax Colposcopy procedure note on this pt cytology dept of [facility] [phone]. Letter from the dept is in your area  ??pls ask

## 2021-02-19 NOTE — Telephone Encounter (Signed)
Faxed to cytology the results of her family tree follow up colposcopy with dr Elonda Husky

## 2021-03-09 DIAGNOSIS — G4709 Other insomnia: Secondary | ICD-10-CM | POA: Diagnosis not present

## 2021-03-21 DIAGNOSIS — J0100 Acute maxillary sinusitis, unspecified: Secondary | ICD-10-CM | POA: Diagnosis not present

## 2021-03-21 DIAGNOSIS — J069 Acute upper respiratory infection, unspecified: Secondary | ICD-10-CM | POA: Diagnosis not present

## 2021-03-21 DIAGNOSIS — R059 Cough, unspecified: Secondary | ICD-10-CM | POA: Diagnosis not present

## 2021-03-21 DIAGNOSIS — Z20822 Contact with and (suspected) exposure to covid-19: Secondary | ICD-10-CM | POA: Diagnosis not present

## 2021-03-24 ENCOUNTER — Ambulatory Visit: Payer: 59 | Admitting: Family Medicine

## 2021-03-26 ENCOUNTER — Other Ambulatory Visit: Payer: Self-pay

## 2021-03-26 ENCOUNTER — Encounter: Payer: Self-pay | Admitting: Family Medicine

## 2021-03-26 ENCOUNTER — Ambulatory Visit (INDEPENDENT_AMBULATORY_CARE_PROVIDER_SITE_OTHER): Payer: Medicare Other | Admitting: Family Medicine

## 2021-03-26 ENCOUNTER — Telehealth: Payer: Self-pay

## 2021-03-26 DIAGNOSIS — J301 Allergic rhinitis due to pollen: Secondary | ICD-10-CM | POA: Diagnosis not present

## 2021-03-26 DIAGNOSIS — J029 Acute pharyngitis, unspecified: Secondary | ICD-10-CM | POA: Diagnosis not present

## 2021-03-26 MED ORDER — CETIRIZINE HCL 10 MG PO TABS: 10.0000 mg | ORAL_TABLET | Freq: Every day | ORAL | 2 refills | Status: DC

## 2021-03-26 MED ORDER — PREDNISONE 5 MG PO TABS: 5.0000 mg | ORAL_TABLET | Freq: Two times a day (BID) | ORAL | 0 refills | Status: AC

## 2021-03-26 MED ORDER — NYSTATIN 100000 UNIT/ML MT SUSP: 5.0000 mL | Freq: Three times a day (TID) | OROMUCOSAL | 0 refills | Status: AC | PRN

## 2021-03-26 NOTE — Patient Instructions (Addendum)
F/u as before, call if you need me sooner  Medication is prescribed for uncontrolled allergies with wheezing and sore throat  Nurse please ask pharmacy to deliver meds per pt request

## 2021-03-26 NOTE — Telephone Encounter (Signed)
Pt called in stating that she is experiencing sob and wheezing when she lays down and doesn't think that she can wait until her phone visit this afternoon. Advised pt to go to the ER if she is experiencing this pt states that she will rather wait until her phone visit because she doesn't want to go to the ER advised to pt if she feels like she is getting worse than she will need to go to the ER. Pt verbalized understanding.

## 2021-03-26 NOTE — Progress Notes (Signed)
Virtual Visit via Telephone Note  I connected with Sandra Riley on 03/26/21 at  2:40 PM EST by telephone and verified that I am speaking with the correct person using two identifiers.  Location: Patient: home Provider: office   I discussed the limitations, risks, security and privacy concerns of performing an evaluation and management service by telephone and the availability of in person appointments. I also discussed with the patient that there may be a patient responsible charge related to this service. The patient expressed understanding and agreed to proceed.   History of Present Illness:   1 week h/o cough, wheezing and shortness of breath, no fever , chills or sputum C/o sore throat Observations/Objective:  There were no vitals taken for this visit. Good communication with no confusion and intact memory. Alert and oriented x 3 Dry hacking cough heard during interview Assessment and Plan: ALLERGIC RHINITIS, SEASONAL Uncontrolled with wheezing nd dry cough, meds prescribed, zyrtec and short course of prednisone  Sore throat Short course of [facility] prescribed   Follow Up Instructions:    I discussed the assessment and treatment plan with the patient. The patient was provided an opportunity to ask questions and all were answered. The patient agreed with the plan and demonstrated an understanding of the instructions.   The patient was advised to call back or seek an in-person evaluation if the symptoms worsen or if the condition fails to improve as anticipated.  I provided 9 minutes of non-face-to-face time during this encounter.   Tula Nakayama, MD

## 2021-03-27 ENCOUNTER — Telehealth: Payer: Self-pay | Admitting: Family Medicine

## 2021-03-27 ENCOUNTER — Other Ambulatory Visit: Payer: Self-pay | Admitting: Family Medicine

## 2021-03-27 NOTE — Telephone Encounter (Signed)
Please advise 

## 2021-03-27 NOTE — Telephone Encounter (Signed)
Selena with [facility] called in on patient behalf.  [facility] is unable to make the  compound mouthwash requested for patient..   Wanted to see if provider wanted to switch to different mouth wash    Call back [phone]

## 2021-03-27 NOTE — Telephone Encounter (Signed)
Pharmacy received

## 2021-04-06 ENCOUNTER — Encounter: Payer: Self-pay | Admitting: Family Medicine

## 2021-04-06 DIAGNOSIS — J029 Acute pharyngitis, unspecified: Secondary | ICD-10-CM | POA: Insufficient documentation

## 2021-04-06 NOTE — Assessment & Plan Note (Addendum)
Uncontrolled with wheezing nd dry cough, meds prescribed, zyrtec and short course of prednisone ?

## 2021-04-06 NOTE — Assessment & Plan Note (Signed)
Short course of [facility] prescribed ?

## 2021-04-10 ENCOUNTER — Ambulatory Visit (INDEPENDENT_AMBULATORY_CARE_PROVIDER_SITE_OTHER): Payer: Medicare Other | Admitting: Family Medicine

## 2021-04-10 ENCOUNTER — Encounter: Payer: Self-pay | Admitting: Family Medicine

## 2021-04-10 ENCOUNTER — Other Ambulatory Visit: Payer: Self-pay

## 2021-04-10 VITALS — BP 130/80 | HR 92 | Resp 17 | Ht 62.0 in | Wt 128.1 lb

## 2021-04-10 DIAGNOSIS — J301 Allergic rhinitis due to pollen: Secondary | ICD-10-CM

## 2021-04-10 DIAGNOSIS — Z634 Disappearance and death of family member: Secondary | ICD-10-CM

## 2021-04-10 DIAGNOSIS — Z1231 Encounter for screening mammogram for malignant neoplasm of breast: Secondary | ICD-10-CM

## 2021-04-10 DIAGNOSIS — F4321 Adjustment disorder with depressed mood: Secondary | ICD-10-CM | POA: Diagnosis not present

## 2021-04-10 DIAGNOSIS — G40309 Generalized idiopathic epilepsy and epileptic syndromes, not intractable, without status epilepticus: Secondary | ICD-10-CM

## 2021-04-10 DIAGNOSIS — F322 Major depressive disorder, single episode, severe without psychotic features: Secondary | ICD-10-CM

## 2021-04-10 DIAGNOSIS — F321 Major depressive disorder, single episode, moderate: Secondary | ICD-10-CM | POA: Diagnosis not present

## 2021-04-10 DIAGNOSIS — M810 Age-related osteoporosis without current pathological fracture: Secondary | ICD-10-CM

## 2021-04-10 DIAGNOSIS — G4700 Insomnia, unspecified: Secondary | ICD-10-CM | POA: Diagnosis not present

## 2021-04-10 DIAGNOSIS — I10 Essential (primary) hypertension: Secondary | ICD-10-CM | POA: Diagnosis not present

## 2021-04-10 MED ORDER — CETIRIZINE HCL 10 MG PO TABS: 10.0000 mg | ORAL_TABLET | Freq: Every day | ORAL | 3 refills | Status: DC

## 2021-04-10 MED ORDER — MIRTAZAPINE 45 MG PO TABS: 45.0000 mg | ORAL_TABLET | Freq: Every day | ORAL | 3 refills | Status: DC

## 2021-04-10 MED ORDER — BENZONATATE 100 MG PO CAPS: 100.0000 mg | ORAL_CAPSULE | Freq: Two times a day (BID) | ORAL | 0 refills | Status: DC | PRN

## 2021-04-10 NOTE — Patient Instructions (Addendum)
F/U in 14 weeks , call if you need me sooner ? ?Please schedule mammogram at checkout ? ?Higher dose of Remeron and you are referred to a Therapist ? ?Medications for cough and allergies are prescribed ? ?Thanks for choosing [facility], we consider it a privelige to serve you. ? ?

## 2021-04-12 DIAGNOSIS — F322 Major depressive disorder, single episode, severe without psychotic features: Secondary | ICD-10-CM | POA: Insufficient documentation

## 2021-04-12 NOTE — Progress Notes (Signed)
? ?  Sandra Riley     MRN: [id number]      DOB: 10-02-1960 ? ? ?HPI ?Ms. Sweeten is here for follow up and re-evaluation of chronic medical conditions, medication management and review of any available recent lab and radiology data.  ?Preventive health is updated, specifically  Cancer screening and Immunization.   ?Questions or concerns regarding consultations or procedures which the PT has had in the interim are  addressed. ?The PT denies any adverse reactions to current medications since the last visit.  ?C/o grieving, increased depression and poor sleep due to loss of child ?C/o increased cough and nasal congestion, no fevr , chills or green sputum  ? ?ROS ?Denies recent fever or chills. ? ?Denies chest pains, palpitations and leg swelling ?Denies abdominal pain, nausea, vomiting,diarrhea or constipation.   ?Denies dysuria, frequency, hesitancy or incontinence. ?Denies joint pain, swelling and limitation in mobility. ?Denies headaches, seizures, numbness, or tingling. ? ? ?PE ? ?BP 130/80   Pulse 92   Resp 17   Ht '5\' 2"'$  (1.575 m)   Wt 128 lb 1.9 oz (58.1 kg)   SpO2 97%   BMI 23.43 kg/m?  ? ?Patient alert and oriented and in no cardiopulmonary distress. ? ?HEENT: No facial asymmetry, EOMI,     Neck supple .No sinus tenderness, postiive nasal congestion ? ?Chest: Clear to auscultation bilaterally. ? ?CVS: S1, S2 no murmurs, no S3.Regular rate. ? ?ABD: Soft non tender.  ? ?Ext: No edema ? ?MS: Adequate ROM spine, shoulders, hips and knees. ? ?Skin: Intact, no ulcerations or rash noted. ? ?Psych: Good eye contact, normal affect. Memory intact not anxious mildy tearful at times, not  depressed appearing. ? ?CNS: CN 2-12 intact, power,  normal throughout.no focal deficits noted. ? ? ?Assessment & Plan ? ?Depression, major, single episode, severe ([place]) ?Increase remeron dose and refer to therapist ? ?Grief at loss of child ?Expected grief and depression due to recent unexpected loss of son, refer  therapy ? ?Mixed insomnia ?uncontrolled inc Remeron dose ?Sleep hygiene reviewed and written information offered also. ?Prescription sent for  medication needed. ? ? ?Essential hypertension ?Controlled, no change in medication ?DASH diet and commitment to daily physical activity for a minimum of 30 minutes discussed and encouraged, as a part of hypertension management. ?The importance of attaining a healthy weight is also discussed. ? ?BP/Weight 04/10/2021 11/13/2020 10/21/2020 06/25/2020 03/26/2020 03/05/2020 01/15/2020  ?Systolic BP 629 476 546 503 138 - 177  ?Diastolic BP 80 81 81 90 81 - 80  ?Wt. (Lbs) 128.12 132 127 137 131.12 128 128  ?BMI 23.43 24.14 23.23 25.06 23.98 23.41 23.41  ? ? ? ? ? ?Generalized convulsive epilepsy ([place]) ?Uncontrolled , recent seizure reported, managed by Neurology ? ?Osteoporosis without current pathological fracture ?Continue weekly  fosamax and calcium with D daily, also daily weight bearing exercise ? ?ALLERGIC RHINITIS, SEASONAL ?Currently experiencing increased symptoms of cough and sinus congestion, daily zyrtec and short term tessalon perles prescribed ? ?

## 2021-04-12 NOTE — Assessment & Plan Note (Signed)
Expected grief and depression due to recent unexpected loss of son, refer therapy ?

## 2021-04-12 NOTE — Assessment & Plan Note (Signed)
Increase remeron dose and refer to therapist ?

## 2021-04-12 NOTE — Assessment & Plan Note (Signed)
uncontrolled inc Remeron dose ?Sleep hygiene reviewed and written information offered also. ?Prescription sent for  medication needed. ? ?

## 2021-04-12 NOTE — Assessment & Plan Note (Signed)
Controlled, no change in medication ?DASH diet and commitment to daily physical activity for a minimum of 30 minutes discussed and encouraged, as a part of hypertension management. ?The importance of attaining a healthy weight is also discussed. ? ?BP/Weight 04/10/2021 11/13/2020 10/21/2020 06/25/2020 03/26/2020 03/05/2020 01/15/2020  ?Systolic BP 785 885 027 741 138 - 177  ?Diastolic BP 80 81 81 90 81 - 80  ?Wt. (Lbs) 128.12 132 127 137 131.12 128 128  ?BMI 23.43 24.14 23.23 25.06 23.98 23.41 23.41  ? ? ? ? ?

## 2021-04-12 NOTE — Assessment & Plan Note (Signed)
Currently experiencing increased symptoms of cough and sinus congestion, daily zyrtec and short term tessalon perles prescribed ?

## 2021-04-12 NOTE — Assessment & Plan Note (Signed)
Continue weekly  fosamax and calcium with D daily, also daily weight bearing exercise ?

## 2021-04-12 NOTE — Assessment & Plan Note (Signed)
Uncontrolled , recent seizure reported, managed by Neurology ?

## 2021-04-14 ENCOUNTER — Telehealth: Payer: Self-pay | Admitting: *Deleted

## 2021-04-14 NOTE — Chronic Care Management (AMB) (Signed)
?  Chronic Care Management  ? ?Outreach Note ? ?04/14/2021 ?Name: Sandra Riley MRN: [id number] DOB: 08-07-1960 ? ?Sandra Riley is a 61 y.o. year old female who is a primary care patient of Fayrene Helper, MD. I reached out to Danley Danker by phone today in response to a referral sent by [name]'s primary care provider. ? ?An unsuccessful telephone outreach was attempted today. The patient was referred to the case management team for assistance with care management and care coordination.  ? ?Follow Up Plan: The care management team will reach out to the patient again over the next 7 days. If patient returns call to provider office, please advise to call [place] at [phone]. ? ?Laverda Sorenson  ?Care Guide, Embedded Care Coordination ?[place]  Care Management  ?Direct Dial: [phone] ? ?

## 2021-04-20 NOTE — Chronic Care Management (AMB) (Signed)
?  Chronic Care Management  ? ?Note ? ?04/20/2021 ?Name: Larry Knipp MRN: [id number] DOB: 1960-11-22 ? ?Daralyn Estephany Perot is a 61 y.o. year old female who is a primary care patient of Fayrene Helper, MD. I reached out to Danley Danker by phone today in response to a referral sent by Ms. Josph Macho Rainey's PCP. ? ?Ms. Gotschall was given information about Chronic Care Management services today including:  ?CCM service includes personalized support from designated clinical staff supervised by her physician, including individualized plan of care and coordination with other care providers ?24/7 contact phone numbers for assistance for urgent and routine care needs. ?Service will only be billed when office clinical staff spend 20 minutes or more in a month to coordinate care. ?Only one practitioner may furnish and bill the service in a calendar month. ?The patient may stop CCM services at any time (effective at the end of the month) by phone call to the office staff. ?The patient is responsible for co-pay (up to 20% after annual deductible is met) if co-pay is required by the individual health plan.  ? ?Patient agreed to services and verbal consent obtained.  ? ?Follow up plan: ?Telephone appointment with care management team member scheduled for:04/23/21 ? ?Laverda Sorenson  ?Care Guide, Embedded Care Coordination ?[place]  Care Management  ?Direct Dial: [phone] ? ?

## 2021-04-20 NOTE — Telephone Encounter (Signed)
Patient called back in returning Stacey's call. Provided Stacey's number to patient. ?

## 2021-04-20 NOTE — Chronic Care Management (AMB) (Signed)
?  Chronic Care Management  ? ?Outreach Note ? ?04/20/2021 ?Name: Valena Ivanov MRN: [id number] DOB: 02/01/1961 ? ?Shakeena Brandye Inthavong is a 61 y.o. year old female who is a primary care patient of Fayrene Helper, MD. I reached out to Danley Danker by phone today in response to a referral sent by Ms. Josph Macho Haefner's primary care provider. ? ?A second unsuccessful telephone outreach was attempted today. The patient was referred to the case management team for assistance with care management and care coordination.  ? ?Follow Up Plan: The care management team will reach out to the patient again over the next 7 days.  ?If patient returns call to provider office, please advise to call [place]* at [phone].* ? ?Laverda Sorenson  ?Care Guide, Embedded Care Coordination ?[place]  Care Management  ?Direct Dial: [phone] ? ?

## 2021-04-23 ENCOUNTER — Telehealth: Payer: Medicare Other

## 2021-05-04 DIAGNOSIS — Z79899 Other long term (current) drug therapy: Secondary | ICD-10-CM | POA: Diagnosis not present

## 2021-05-04 DIAGNOSIS — G4709 Other insomnia: Secondary | ICD-10-CM | POA: Diagnosis not present

## 2021-05-05 ENCOUNTER — Other Ambulatory Visit: Payer: Self-pay | Admitting: Family Medicine

## 2021-05-06 ENCOUNTER — Telehealth: Payer: Self-pay | Admitting: *Deleted

## 2021-05-06 NOTE — Chronic Care Management (AMB) (Signed)
?  Care Management  ? ?Note ? ?05/06/2021 ?Name: Sandra Riley MRN: [id number] DOB: 06-11-1960 ? ?[name] is a 61 y.o. year old female who is a primary care patient of Fayrene Helper, MD and is actively engaged with the care management team. I reached out to Danley Danker by phone today to assist with re-scheduling an initial visit with the Licensed Clinical Social Worker ? ?Follow up plan: ?Unsuccessful telephone outreach attempt made. A HIPAA compliant phone message was left for the patient providing contact information and requesting a return call.  ?The care management team will reach out to the patient again over the next 14 days.  ?If patient returns call to provider office, please advise to call [place]  at [phone]. ? ?Laverda Sorenson  ?Care Guide, Embedded Care Coordination ?[place]  Care Management  ?Direct Dial: [phone] ? ?

## 2021-05-07 NOTE — Chronic Care Management (AMB) (Signed)
?  Care Management  ? ?Note ? ?05/07/2021 ?Name: Sharne Linders MRN: [id number] DOB: Jun 05, 1960 ? ?Sloan Zuri Lascala is a 61 y.o. year old female who is a primary care patient of Fayrene Helper, MD and is actively engaged with the care management team. I reached out to Danley Danker by phone today to assist with re-scheduling an initial visit with the Licensed Clinical Social Worker ? ?Follow up plan: ?Telephone appointment with care management team member scheduled for:06/19/21 ? ?Laverda Sorenson  ?Care Guide, Embedded Care Coordination ?[place]  Care Management  ?Direct Dial: [phone] ? ?

## 2021-05-08 DIAGNOSIS — Z886 Allergy status to analgesic agent status: Secondary | ICD-10-CM | POA: Diagnosis not present

## 2021-05-08 DIAGNOSIS — Z609 Problem related to social environment, unspecified: Secondary | ICD-10-CM | POA: Diagnosis not present

## 2021-05-11 ENCOUNTER — Telehealth: Payer: Self-pay | Admitting: Family Medicine

## 2021-05-11 NOTE — Telephone Encounter (Signed)
Patient needs refill on  ? ?PHENobarbital (LUMINAL) 97.2 MG tablet  ? ? ?Drug store has shortage on prescribed dosage, will need '100mg'$  sent in  ? ?Patient is out of med, had ER visit on Friday on regard  ? ?Patient would like a call back when med is sent in to pharm. ?

## 2021-05-12 ENCOUNTER — Other Ambulatory Visit: Payer: Self-pay

## 2021-05-12 ENCOUNTER — Other Ambulatory Visit: Payer: Self-pay | Admitting: Family Medicine

## 2021-05-12 DIAGNOSIS — G40309 Generalized idiopathic epilepsy and epileptic syndromes, not intractable, without status epilepticus: Secondary | ICD-10-CM

## 2021-05-12 MED ORDER — PHENOBARBITAL 100 MG PO TABS: 100.0000 mg | ORAL_TABLET | Freq: Two times a day (BID) | ORAL | 5 refills | Status: DC

## 2021-05-12 NOTE — Telephone Encounter (Signed)
Patient aware and lab ordered.  

## 2021-05-12 NOTE — Progress Notes (Signed)
dose change asd previous tab unavailable ?

## 2021-06-19 ENCOUNTER — Ambulatory Visit (INDEPENDENT_AMBULATORY_CARE_PROVIDER_SITE_OTHER): Payer: Medicare Other | Admitting: Licensed Clinical Social Worker

## 2021-06-19 DIAGNOSIS — G4700 Insomnia, unspecified: Secondary | ICD-10-CM

## 2021-06-19 DIAGNOSIS — Z634 Disappearance and death of family member: Secondary | ICD-10-CM

## 2021-06-19 DIAGNOSIS — F321 Major depressive disorder, single episode, moderate: Secondary | ICD-10-CM

## 2021-06-19 DIAGNOSIS — G40309 Generalized idiopathic epilepsy and epileptic syndromes, not intractable, without status epilepticus: Secondary | ICD-10-CM

## 2021-06-19 DIAGNOSIS — M810 Age-related osteoporosis without current pathological fracture: Secondary | ICD-10-CM

## 2021-06-19 NOTE — Chronic Care Management (AMB) (Signed)
Chronic Care Management    Clinical Social Work Note  06/19/2021 Name: Sandra Riley MRN: [id number] DOB: July 09, 1960  Sandra Riley is a 61 y.o. year old female who is a primary care patient of Moshe Cipro Norwood Levo, MD. The CCM team was consulted to assist the patient with chronic disease management and/or care coordination needs related to: [employer] .   Engaged with patient by telephone for initial visit in response to provider referral for social work chronic care management and care coordination services.   Consent to Services:  The patient was given the following information about Chronic Care Management services today, agreed to services, and gave verbal consent: 1. CCM service includes personalized support from designated clinical staff supervised by the primary care provider, including individualized plan of care and coordination with other care providers 2. 24/7 contact phone numbers for assistance for urgent and routine care needs. 3. Service will only be billed when office clinical staff spend 20 minutes or more in a month to coordinate care. 4. Only one practitioner may furnish and bill the service in a calendar month. 5.The patient may stop CCM services at any time (effective at the end of the month) by phone call to the office staff. 6. The patient will be responsible for cost sharing (co-pay) of up to 20% of the service fee (after annual deductible is met). Patient agreed to services and consent obtained.  Patient agreed to services and consent obtained.   Assessment: Review of patient past medical history, allergies, medications, and health status, including review of relevant consultants reports was performed today as part of a comprehensive evaluation and provision of chronic care management and care coordination services.     SDOH (Social Determinants of Health) assessments and interventions performed:  SDOH Interventions    Flowsheet Row Most Recent Value   SDOH Interventions   Stress Interventions Provide Counseling  [client has grief related to death of her son]  Depression Interventions/Treatment  Counseling.  LCSW talked with client about Grief Share program through local Hospice        Advanced Directives Status: See [employer]yn[employer] application for related entries.  CCM Care Plan  Allergies  Allergen Reactions   Aspirin     Allergic to non coated ASA   Singulair [Montelukast Sodium] Other (See Comments)    Became sweaty and nuaseated    Outpatient Encounter Medications as of 06/19/2021  Medication Sig   acetaminophen (TYLENOL) 500 MG tablet Take 500 mg by mouth every 6 (six) hours as needed for moderate pain.    alendronate (FOSAMAX) 35 MG tablet Take 1 tablet (35 mg total) by mouth every 7 (seven) days. Take with a full glass of water on an empty stomach.   amLODipine (NORVASC) 5 MG tablet TAKE ONE TABLET BY MOUTH DAILY   aspirin EC 81 MG tablet Take 81 mg by mouth daily.   B Complex-C-Folic Acid (HM SUPER VITAMIN B COMPLEX/C PO) Take by mouth daily.   benzonatate (TESSALON) 100 MG capsule Take 1 capsule (100 mg total) by mouth 2 (two) times daily as needed for cough.   Calcium Carb-Cholecalciferol 1000-800 MG-UNIT TABS Take 1 tablet by mouth 3 (three) times daily.    cetirizine (ZYRTEC) 10 MG tablet Take 1 tablet (10 mg total) by mouth daily.   Chlorphen-PE-Acetaminophen (ALLERGY MULTI-SYMPTOM PO) Take 2 capsules by mouth 4 (four) times daily.   Cholecalciferol (VITAMIN D) 50 MCG (2000 UT) CAPS Take 1 capsule by mouth daily.   clonazePAM (KLONOPIN) 1  MG tablet Take 1 tablet (1 mg total) by mouth 2 (two) times daily as needed for anxiety.   levETIRAcetam (KEPPRA) 1000 MG tablet SMARTSIG:1.5 Tablet(s) By Mouth Every 12 Hours   MAGNESIUM PO Take 500 mg by mouth daily.    mirtazapine (REMERON) 45 MG tablet Take 1 tablet (45 mg total) by mouth at bedtime.   Multiple Vitamin (MULTIVITAMIN) tablet Take 1 tablet by mouth daily.   PHENObarbital  (LUMINAL) 100 MG tablet Take 1 tablet (100 mg total) by mouth 2 (two) times daily.   Potassium 99 MG TABS Take 99 mg by mouth daily.    spironolactone (ALDACTONE) 50 MG tablet Take 1 tablet (50 mg total) by mouth daily.   zinc gluconate 50 MG tablet Take 50 mg by mouth daily.   No facility-administered encounter medications on file as of 06/19/2021.    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  Manage depression issues; Manage anxiety issues   Start Date: 06/19/2021  Expected End Date: 09/22/2021  This Visit's Progress: Not on track  Priority: High  Note:   Current Barriers:   Grief issues experienced Sleeping issues Decreased appetite Suicidal Ideation/Homicidal Ideation: No  Clinical Social Work Goal(s):  patient will work with SW monthly by telephone or in person to reduce or manage symptoms related to grief.issues Patient will work with SW monthly by telephone or in person to reduce or manage symptoms related to depression Client to attend scheduled medical appointments Client to communicate, as needed, with RNCM to discuss nursing needs of client  Interventions: Patient interviewed and appropriate assessments  performed: GAD: 7; PHQ 2/9 1:1 collaboration with Fayrene Helper, MD regarding development and update of comprehensive plan of care as evidenced by provider attestation and co-signature Discussed client needs with Danley Danker Discussed sleeping challenges of client.  Client has challenges in sleeping Discussed appetite of client Reviewed transport needs of client.  Client said her spouse helps her with transport needs. Provided counseling support for client Discussed grief issues of client. Client said her son passed away in Apr 16, 2021.  She said that the death of her son was unexpected.   Informed client of Grief Share program with local Hospice.  She said she has been able to talk with her family members about grief issues experienced. Client said it is hard for her to talk about these grief issues in a group setting Discussed medication procurement of client Discussed ADLs completion of client Reviewed ambulation of client . She said she is walking well without a device Reviewed mood status. She is sad due to death of her son in 04/16/2021.  She does have support her spouse, Karna Christmas. She has support of her two sons Encouraged  client to call RNCM as needed for nursing support Reviewed upcoming client medical appointment.  Used Active Listening to hear needs and feelings of client. She was actively grieving during phone call with LCSW.  She spoke of some of her favorite memories of her son Discussed support she receives with Dr. Merlene Laughter, neurologist. Client spoke of history of seizures. She said she will sometimes have seizures while she sleeps at night.  Patient  Coping Skills:  Has support of her spouse, Karna Christmas Has support from her 2 sons. Attends scheduled medical appointments Has no transport needs at present  Patient Self Care Deficits:  Grief issues experienced Depression issues Anxiety issues  Patient Goals:  - spend time or talk with others at least 2 to 3  times per week - practice relaxation or meditation daily - keep a calendar with appointment dates  Follow Up Plan: LCSW to call client on 07/20/21 at 1:00 PM to assess needs of client     Norva Riffle.Square Jowett MSW, [place] Holiday representative [facility] Care Management [phone]

## 2021-06-19 NOTE — Patient Instructions (Addendum)
Visit Information  Patient Goals:  Depressive Symptoms Identified. Manage Depression issues. Manage Grief issues. Manage anxiety issues  Time Frame:  Short Term Goal Priority:  High Progress: Not On Track  Start Date:  06/19/21 End Date: 09/22/21  Follow Up Date:  07/20/21 at 1:00 PM  Depressive Symptoms Identified.  Manage Depression issues. Manage Grief issues. Manage anxiety issues  Patient  Coping Skills:  Has support of her spouse, Karna Christmas Has support from her 2 sons. Attends scheduled medical appointments Has no transport needs at present  Patient Self Care Deficits:  Grief issues experienced Depression issues Anxiety issues  Patient Goals:  - spend time or talk with others at least 2 to 3 times per week - practice relaxation or meditation daily - keep a calendar with appointment dates  Follow Up Plan: LCSW to call client on 07/20/21 at 1:00 PM to assess needs of client   Norva Riffle.Madlynn Lundeen MSW, [place] Holiday representative [facility] Care Management [phone]

## 2021-06-30 ENCOUNTER — Other Ambulatory Visit: Payer: Self-pay | Admitting: Family Medicine

## 2021-07-01 DIAGNOSIS — F4321 Adjustment disorder with depressed mood: Secondary | ICD-10-CM

## 2021-07-01 DIAGNOSIS — Z634 Disappearance and death of family member: Secondary | ICD-10-CM

## 2021-07-07 ENCOUNTER — Other Ambulatory Visit (HOSPITAL_COMMUNITY): Payer: Self-pay | Admitting: Nurse Practitioner

## 2021-07-17 ENCOUNTER — Ambulatory Visit (INDEPENDENT_AMBULATORY_CARE_PROVIDER_SITE_OTHER): Payer: Medicare Other | Admitting: Family Medicine

## 2021-07-17 ENCOUNTER — Encounter: Payer: Self-pay | Admitting: Family Medicine

## 2021-07-17 VITALS — BP 137/83 | HR 93 | Resp 16 | Ht 62.0 in | Wt 132.0 lb

## 2021-07-17 DIAGNOSIS — E559 Vitamin D deficiency, unspecified: Secondary | ICD-10-CM

## 2021-07-17 DIAGNOSIS — F322 Major depressive disorder, single episode, severe without psychotic features: Secondary | ICD-10-CM

## 2021-07-17 DIAGNOSIS — G40309 Generalized idiopathic epilepsy and epileptic syndromes, not intractable, without status epilepticus: Secondary | ICD-10-CM

## 2021-07-17 DIAGNOSIS — F4321 Adjustment disorder with depressed mood: Secondary | ICD-10-CM

## 2021-07-17 DIAGNOSIS — Z634 Disappearance and death of family member: Secondary | ICD-10-CM | POA: Diagnosis not present

## 2021-07-17 DIAGNOSIS — I10 Essential (primary) hypertension: Secondary | ICD-10-CM | POA: Diagnosis not present

## 2021-07-17 DIAGNOSIS — R569 Unspecified convulsions: Secondary | ICD-10-CM | POA: Diagnosis not present

## 2021-07-17 DIAGNOSIS — Z13220 Encounter for screening for lipoid disorders: Secondary | ICD-10-CM | POA: Diagnosis not present

## 2021-07-17 DIAGNOSIS — M810 Age-related osteoporosis without current pathological fracture: Secondary | ICD-10-CM

## 2021-07-17 DIAGNOSIS — G4700 Insomnia, unspecified: Secondary | ICD-10-CM | POA: Diagnosis not present

## 2021-07-17 MED ORDER — MIRTAZAPINE 45 MG PO TABS
45.0000 mg | ORAL_TABLET | Freq: Every day | ORAL | 5 refills | Status: DC
Start: 2021-07-17 — End: 2022-03-15

## 2021-07-17 MED ORDER — ALENDRONATE SODIUM 35 MG PO TABS: 35.0000 mg | ORAL_TABLET | ORAL | 5 refills | Status: DC

## 2021-07-17 NOTE — Patient Instructions (Addendum)
Annual exam as before, call if you need em sooner  Please get fasting lipid, cmp and EGFr,phenobarb level, tSH and vit D and CBC today  Blood pressure is controlled  No medication change  Depression and grief are slowly improving , give yourself time  Thanks for choosing [facility], we consider it a privelige to serve you.

## 2021-07-19 LAB — LIPID PANEL
Chol/HDL Ratio: 3.2 ratio (ref 0.0–4.4)
Cholesterol, Total: 200 mg/dL — ABNORMAL HIGH (ref 100–199)
HDL: 62 mg/dL (ref >39)
LDL Chol Calc (NIH): 120 mg/dL — ABNORMAL HIGH (ref 0–99)
Triglycerides: 99 mg/dL (ref 0–149)
VLDL Cholesterol Cal: 18 mg/dL (ref 5–40)

## 2021-07-19 LAB — CMP14+EGFR
ALT: 30 IU/L (ref 0–32)
AST: 40 IU/L (ref 0–40)
Albumin/Globulin Ratio: 1.3 (ref 1.2–2.2)
Albumin: 4.4 g/dL (ref 3.8–4.8)
Alkaline Phosphatase: 205 IU/L — ABNORMAL HIGH (ref 44–121)
BUN/Creatinine Ratio: 6 — ABNORMAL LOW (ref 12–28)
BUN: 4 mg/dL — ABNORMAL LOW (ref 8–27)
Bilirubin Total: 0.3 mg/dL (ref 0.0–1.2)
CO2: 22 mmol/L (ref 20–29)
Calcium: 10.2 mg/dL (ref 8.7–10.3)
Chloride: 98 mmol/L (ref 96–106)
Creatinine, Ser: 0.67 mg/dL (ref 0.57–1.00)
Globulin, Total: 3.3 g/dL (ref 1.5–4.5)
Glucose: 96 mg/dL (ref 70–99)
Potassium: 4.5 mmol/L (ref 3.5–5.2)
Sodium: 135 mmol/L (ref 134–144)
Total Protein: 7.7 g/dL (ref 6.0–8.5)
eGFR: 99 mL/min/{1.73_m2} (ref >59)

## 2021-07-19 LAB — CBC
Hematocrit: 40.7 % (ref 34.0–46.6)
Hemoglobin: 13.8 g/dL (ref 11.1–15.9)
MCH: 33.6 pg — ABNORMAL HIGH (ref 26.6–33.0)
MCHC: 33.9 g/dL (ref 31.5–35.7)
MCV: 99 fL — ABNORMAL HIGH (ref 79–97)
Platelets: 310 10*3/uL (ref 150–450)
RBC: 4.11 x10E6/uL (ref 3.77–5.28)
RDW: 11.8 % (ref 11.7–15.4)
WBC: 4.5 10*3/uL (ref 3.4–10.8)

## 2021-07-19 LAB — VITAMIN D 25 HYDROXY (VIT D DEFICIENCY, FRACTURES): Vit D, 25-Hydroxy: 59 ng/mL (ref 30.0–100.0)

## 2021-07-19 LAB — TSH: TSH: 1.55 u[IU]/mL (ref 0.450–4.500)

## 2021-07-19 LAB — PHENOBARBITAL LEVEL: Phenobarbital, Serum: 39 ug/mL (ref 15–40)

## 2021-07-20 ENCOUNTER — Telehealth: Payer: Medicare Other

## 2021-07-20 ENCOUNTER — Inpatient Hospital Stay (HOSPITAL_COMMUNITY): Admission: RE | Admit: 2021-07-20 | Payer: Medicare Other | Source: Ambulatory Visit

## 2021-07-27 ENCOUNTER — Encounter: Payer: Self-pay | Admitting: Family Medicine

## 2021-07-27 NOTE — Assessment & Plan Note (Signed)
Going through the grieving process with some success

## 2021-07-29 ENCOUNTER — Ambulatory Visit (HOSPITAL_COMMUNITY)
Admission: RE | Admit: 2021-07-29 | Discharge: 2021-07-29 | Disposition: A | Discharge status: Home / Self Care | Payer: Medicare Other | Source: Ambulatory Visit | Attending: Family Medicine | Admitting: Family Medicine

## 2021-07-29 DIAGNOSIS — Z1231 Encounter for screening mammogram for malignant neoplasm of breast: Secondary | ICD-10-CM | POA: Diagnosis not present

## 2021-07-30 ENCOUNTER — Other Ambulatory Visit: Payer: Self-pay | Admitting: Family Medicine

## 2021-07-30 DIAGNOSIS — I10 Essential (primary) hypertension: Secondary | ICD-10-CM

## 2021-08-24 ENCOUNTER — Telehealth: Payer: Self-pay

## 2021-08-24 NOTE — Telephone Encounter (Signed)
Patient called and waiting on a phone call or visit with scott.  Has not heard back to schedule an appointment.

## 2021-08-31 ENCOUNTER — Other Ambulatory Visit: Payer: Self-pay | Admitting: Family Medicine

## 2021-10-19 DIAGNOSIS — Z79899 Other long term (current) drug therapy: Secondary | ICD-10-CM | POA: Diagnosis not present

## 2021-10-19 DIAGNOSIS — G4709 Other insomnia: Secondary | ICD-10-CM | POA: Diagnosis not present

## 2021-10-22 ENCOUNTER — Encounter: Payer: 59 | Admitting: Family Medicine

## 2021-11-15 IMAGING — MG MM DIGITAL SCREENING BILAT W/ TOMO AND CAD
8 series · 9 of 24 positions shown · non-contrast
Comparison: Previous exam(s).

CLINICAL DATA: Screening.

EXAM:
DIGITAL SCREENING BILATERAL MAMMOGRAM WITH TOMOSYNTHESIS AND CAD
TECHNIQUE: Bilateral screening digital craniocaudal and mediolateral oblique
mammograms were obtained. Bilateral screening digital breast
tomosynthesis was performed. The images were evaluated with
computer-aided detection.

[R CC synth-2D]
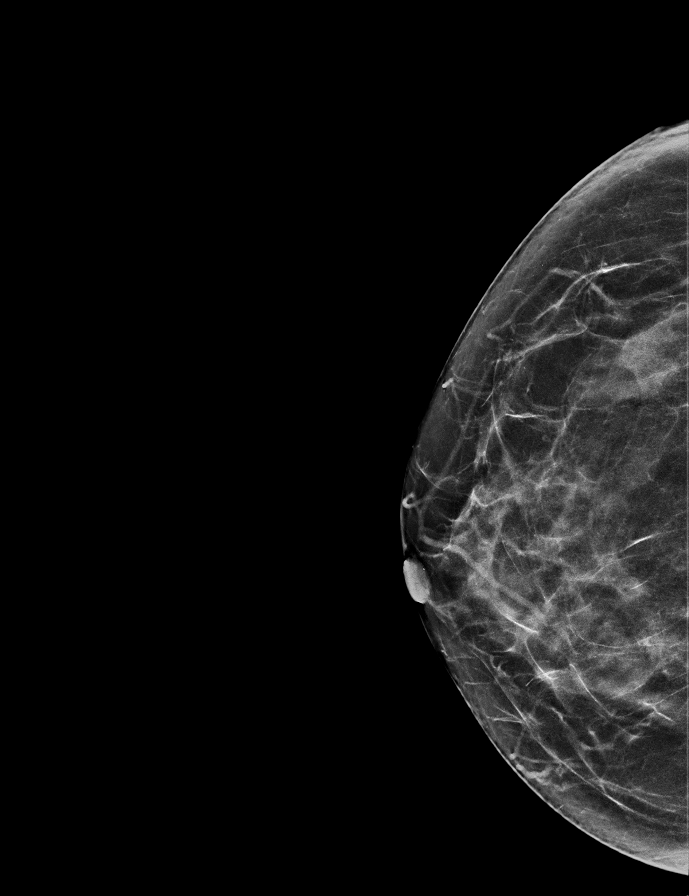

[R MLO synth-2D]
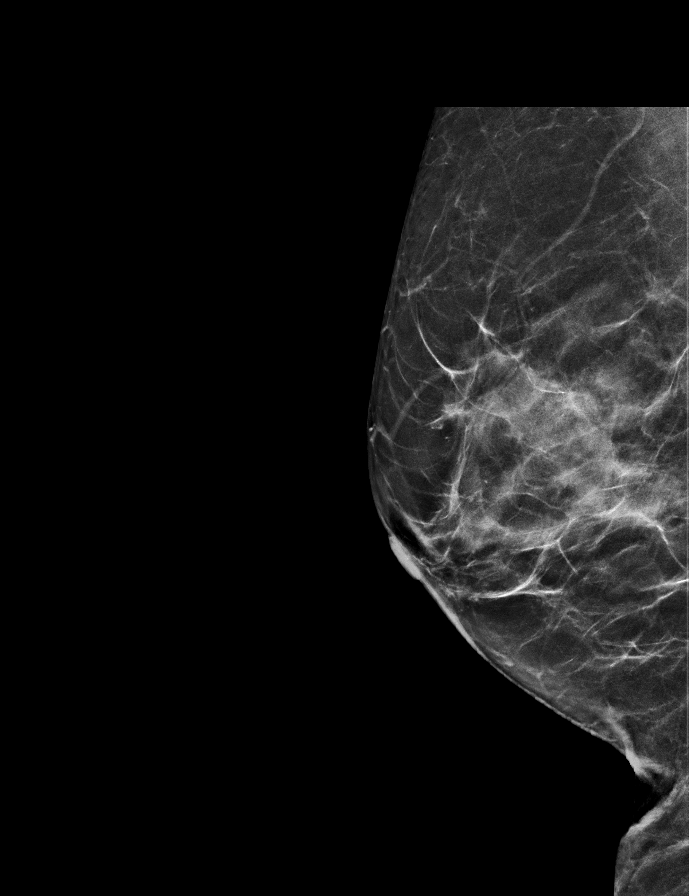

[L MLO synth-2D]
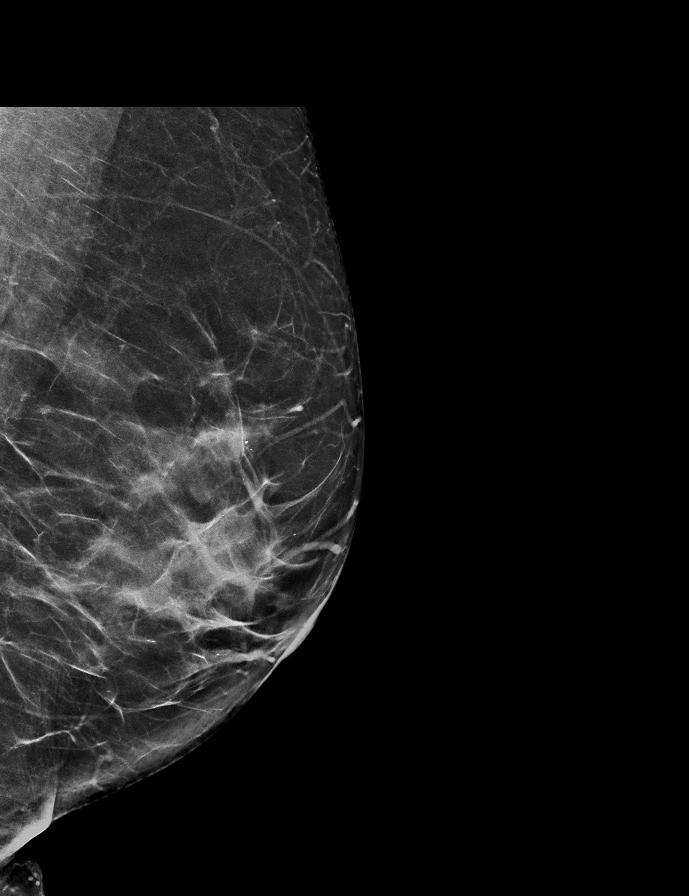

[L CC synth-2D]
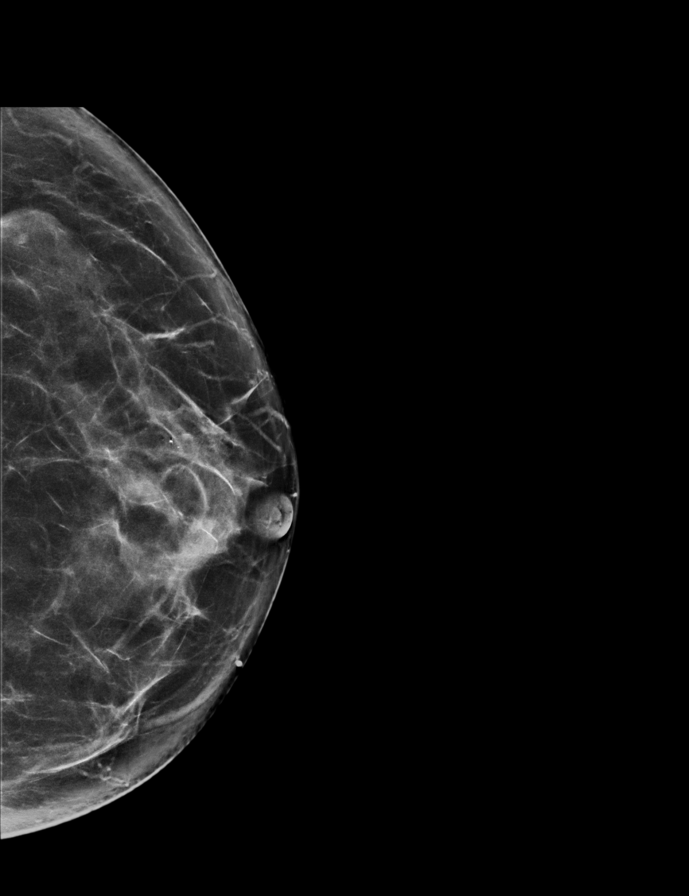

[L MLO tomo · 2 of 65 frames shown]
[frame 21/65]
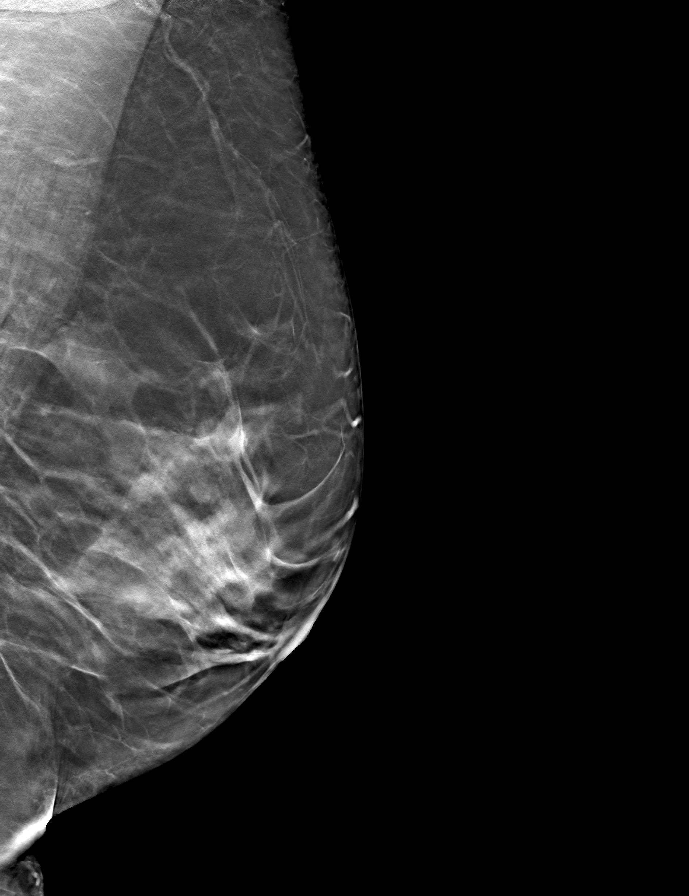
[frame 33/65]
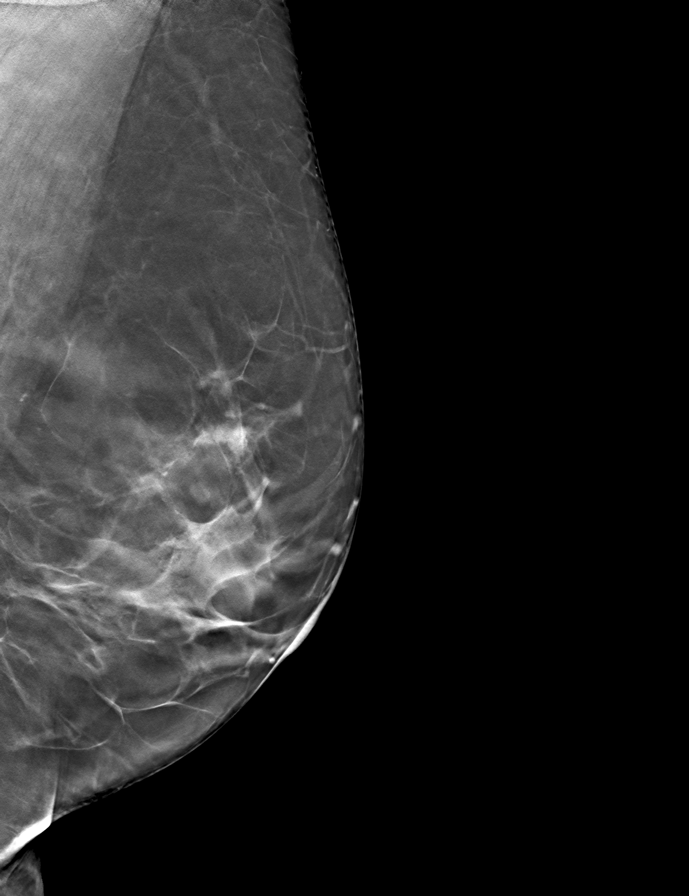

[L CC tomo · tomo slice 36/71.0]
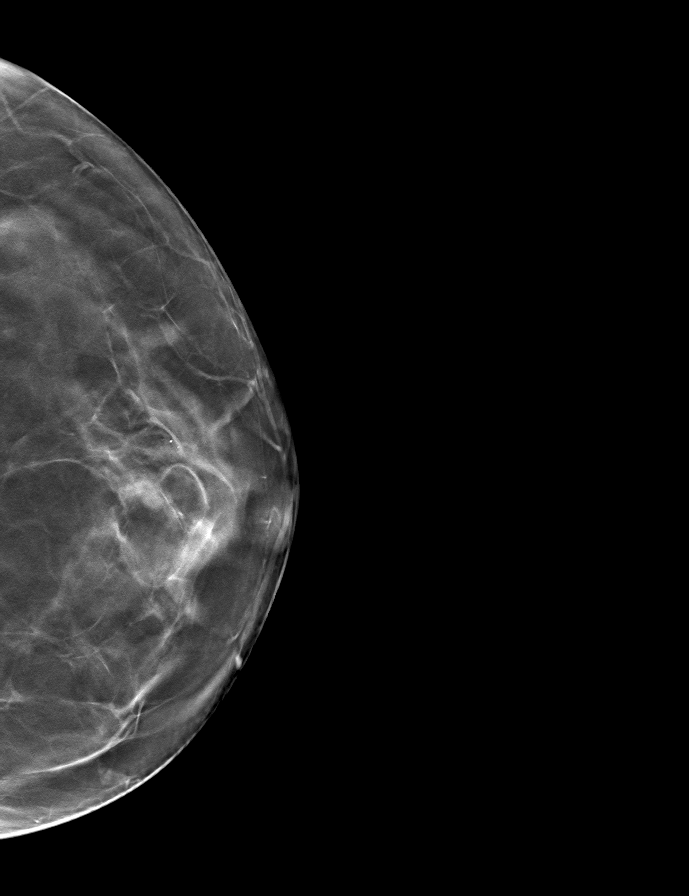

[R MLO tomo · tomo slice 30/59.0]
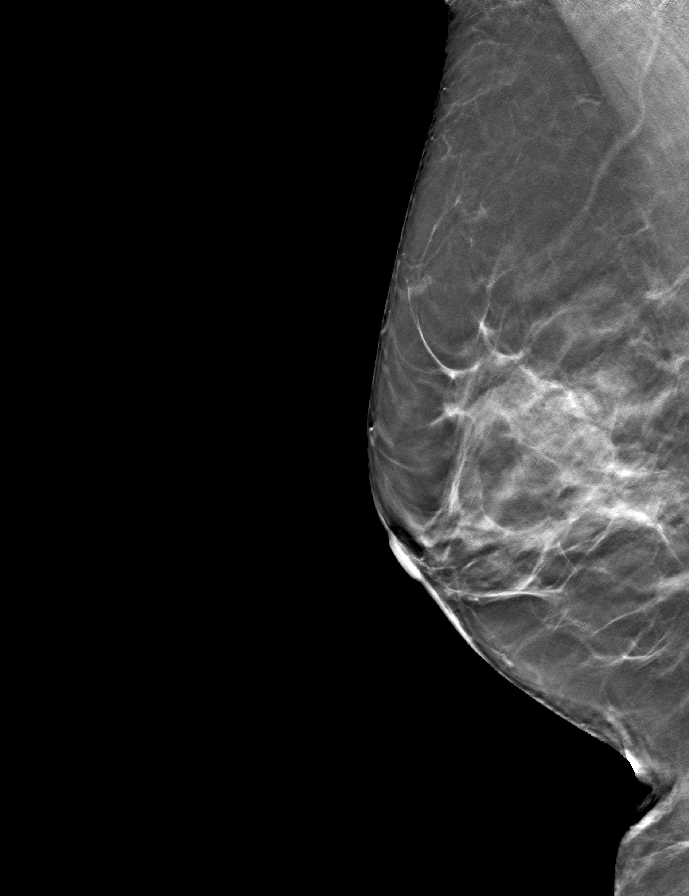

[R CC tomo · tomo slice 29/58.0]
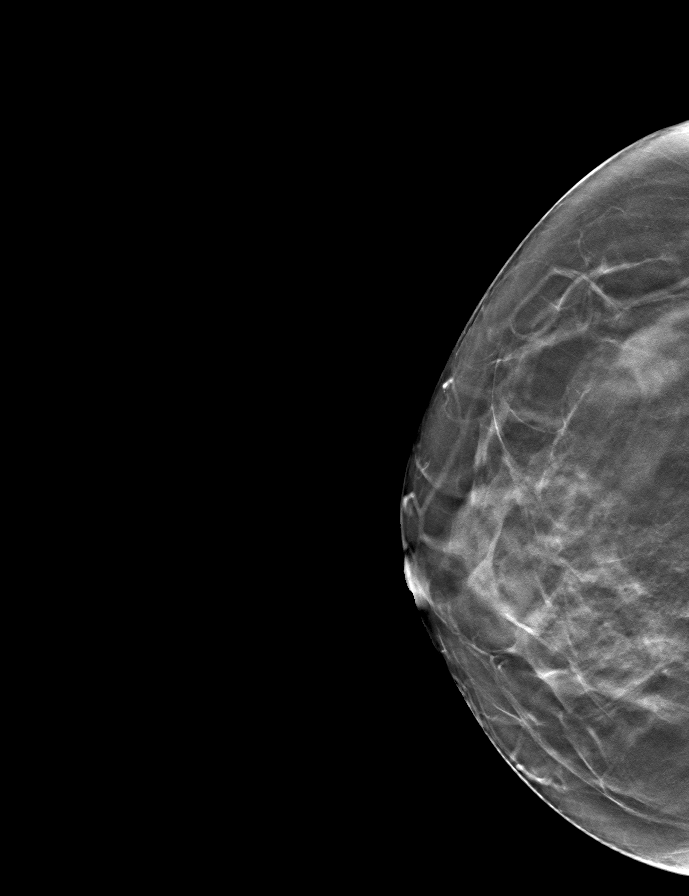

[9 of 24 positions shown; findings below may reference images not displayed]

ACR Breast Density Category c: The breast tissue is heterogeneously
dense, which may obscure small masses.
FINDINGS: There are no findings suspicious for malignancy.
IMPRESSION: No mammographic evidence of malignancy. A result letter of this
screening mammogram will be mailed directly to the patient.

RECOMMENDATION:
1.  Screening mammogram in one year. (Code:[id number]-V-QRV)

2. Consider genetics assessment to determine the patient's lifetime
risk of breast cancer given her family history, if this has not
already been performed. Per American Cancer Society guidelines, if
the patient has a calculated lifetime risk of developing breast
cancer of greater than 20%, annual screening MRI of the breasts
would be recommended at the time of screening mammography.

BI-RADS CATEGORY  1: Negative.

## 2021-11-23 ENCOUNTER — Other Ambulatory Visit: Payer: Self-pay | Admitting: Family Medicine

## 2021-12-02 DIAGNOSIS — H5213 Myopia, bilateral: Secondary | ICD-10-CM | POA: Diagnosis not present

## 2021-12-09 ENCOUNTER — Ambulatory Visit (INDEPENDENT_AMBULATORY_CARE_PROVIDER_SITE_OTHER): Payer: Medicare Other | Admitting: Family Medicine

## 2021-12-09 ENCOUNTER — Telehealth: Payer: Self-pay | Admitting: Family Medicine

## 2021-12-09 ENCOUNTER — Encounter: Payer: Self-pay | Admitting: Family Medicine

## 2021-12-09 VITALS — BP 128/82 | HR 69 | Ht 62.0 in | Wt 139.0 lb

## 2021-12-09 DIAGNOSIS — Z0000 Encounter for general adult medical examination without abnormal findings: Secondary | ICD-10-CM | POA: Insufficient documentation

## 2021-12-09 DIAGNOSIS — J301 Allergic rhinitis due to pollen: Secondary | ICD-10-CM | POA: Diagnosis not present

## 2021-12-09 DIAGNOSIS — E611 Iron deficiency: Secondary | ICD-10-CM | POA: Diagnosis not present

## 2021-12-09 DIAGNOSIS — G40309 Generalized idiopathic epilepsy and epileptic syndromes, not intractable, without status epilepticus: Secondary | ICD-10-CM

## 2021-12-09 DIAGNOSIS — G40919 Epilepsy, unspecified, intractable, without status epilepticus: Secondary | ICD-10-CM | POA: Diagnosis not present

## 2021-12-09 DIAGNOSIS — E538 Deficiency of other specified B group vitamins: Secondary | ICD-10-CM

## 2021-12-09 DIAGNOSIS — I10 Essential (primary) hypertension: Secondary | ICD-10-CM

## 2021-12-09 DIAGNOSIS — Z13220 Encounter for screening for lipoid disorders: Secondary | ICD-10-CM

## 2021-12-09 MED ORDER — UNABLE TO FIND: 0 refills | Status: DC

## 2021-12-09 MED ORDER — CLONAZEPAM 1 MG PO TABS: 1.0000 mg | ORAL_TABLET | Freq: Two times a day (BID) | ORAL | 5 refills | Status: DC | PRN

## 2021-12-09 NOTE — Telephone Encounter (Signed)
Patient called in regard to  Bed rail and humidifier   Patient spoke with [facility] insurance and devices need to be sent to [facility] first

## 2021-12-09 NOTE — Assessment & Plan Note (Addendum)
Requests humidifier due to uncontrolled  allergy symptoms, will rx

## 2021-12-09 NOTE — Assessment & Plan Note (Signed)
Uncontrolled seizures, refer to Neurology, needs to establish with new Provider, also will request bed rail due to report of multiple falls out of bed during noight time seizures. States when awake her spouse " talks her out of a seizure"

## 2021-12-09 NOTE — Patient Instructions (Addendum)
Annual wellness visit due 12/29 or after please schedule at checkout.  F/u in office with MD in 5 months, call if you need me sooner  Lipid CMP and EGFR and phenobarbital level due. Also B12  and iron and magnesium level today  Please get COVID and RSV vaccines both are at your pharmacy.    Nurse please send in order for bed rail x 1 and humidifier to [facility], dx is uncontrolled seizure, repeated falling out of bed, and uncontrolled allergies  You are referred to Neurology in [place] since your allergies are uncontrolled  Thanks for choosing [facility], we consider it a privelige to serve you.

## 2021-12-09 NOTE — Progress Notes (Signed)
     Sandra Riley     MRN: [id number]      DOB: [birth year]  HPI: Patient is in for annual physical exam. C/o uncontrolled seizures, reports seizures in her sleep, repeatedly falling out of bed as a result, and requests bed rail for right side of bed Requests humidifier to help with better control of her allergies Needs new Neurologist, as current is retiring/ closing office. Asked that I just write her meds, but the problem is that she continues to report seizure activityso control is not adequate currently, she agrees to establish with new Neurologist  Recent labs,  are reviewed. Immunization is reviewed , and  updated if needed.   PE: BP 128/82   Pulse 69   Ht '5\' 2"'$  (1.575 m)   Wt 139 lb (63 kg)   SpO2 96%   BMI 25.42 kg/m   Pleasant  female, alert and oriented x 3, in no cardio-pulmonary distress. Afebrile. HEENT No facial trauma or asymetry. Sinuses non tender.  Extra occullar muscles intact.. External ears normal, . Neck: supple, no adenopathy,JVD or thyromegaly.No bruits.  Chest: Clear to ascultation bilaterally.No crackles or wheezes. Non tender to palpation  Cardiovascular system; Heart sounds normal,  S1 and  S2 ,no S3.  No murmur, or thrill. Apical beat not displaced Peripheral pulses normal.  Abdomen: Soft, non tender, no organomegaly or masses. No bruits. Bowel sounds normal. No guarding, tenderness or rebound.   Musculoskeletal exam: Full ROM of spine, hips , shoulders and knees. No deformity ,swelling or crepitus noted. No muscle wasting or atrophy.   Neurologic: Cranial nerves 2 to 12 intact. Power, tone ,sensation and reflexes normal throughout. No disturbance in gait. No tremor.  Skin: Intact, no ulceration, erythema , scaling or rash noted. Pigmentation normal throughout  Psych; Normal mood and affect. 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 Counseling done  re healthy lifestyle involving commitment to 150 minutes exercise per week, heart healthy diet, and attaining healthy weight.The importance of adequate sleep also discussed. Regular seat belt use and home safety, is also discussed. Changes in health habits are decided on by the patient with goals and time frames  set for achieving them. Immunization and cancer screening needs are specifically addressed at this visit.   Generalized convulsive epilepsy ([place]) Uncontrolled seizures, refer to Neurology, needs to establish with new Provider, also will request bed rail due to report of multiple falls out of bed during noight time seizures. States when awake her spouse " talks her out of a seizure"

## 2021-12-09 NOTE — Assessment & Plan Note (Signed)

## 2021-12-09 NOTE — Assessment & Plan Note (Signed)
Reports ongoing seizures mainly in her sleep, falls out of bed sometimes, requests bed rail for 1 side of bed only will send rx , right side of bed

## 2021-12-10 LAB — CMP14+EGFR
ALT: 18 IU/L (ref 0–32)
AST: 24 IU/L (ref 0–40)
Albumin/Globulin Ratio: 1.6 (ref 1.2–2.2)
Albumin: 4.7 g/dL (ref 3.9–4.9)
Alkaline Phosphatase: 173 IU/L — ABNORMAL HIGH (ref 44–121)
BUN/Creatinine Ratio: 5 — ABNORMAL LOW (ref 12–28)
BUN: 3 mg/dL — ABNORMAL LOW (ref 8–27)
Bilirubin Total: <0.2 mg/dL (ref 0.0–1.2)
CO2: 24 mmol/L (ref 20–29)
Calcium: 9.7 mg/dL (ref 8.7–10.3)
Chloride: 97 mmol/L (ref 96–106)
Creatinine, Ser: 0.61 mg/dL (ref 0.57–1.00)
Globulin, Total: 2.9 g/dL (ref 1.5–4.5)
Glucose: 98 mg/dL (ref 70–99)
Potassium: 4.9 mmol/L (ref 3.5–5.2)
Sodium: 134 mmol/L (ref 134–144)
Total Protein: 7.6 g/dL (ref 6.0–8.5)
eGFR: 100 mL/min/{1.73_m2} (ref >59)

## 2021-12-10 LAB — LIPID PANEL
Chol/HDL Ratio: 2.7 ratio (ref 0.0–4.4)
Cholesterol, Total: 174 mg/dL (ref 100–199)
HDL: 64 mg/dL (ref >39)
LDL Chol Calc (NIH): 96 mg/dL (ref 0–99)
Triglycerides: 72 mg/dL (ref 0–149)
VLDL Cholesterol Cal: 14 mg/dL (ref 5–40)

## 2021-12-10 LAB — IRON: Iron: 62 ug/dL (ref 27–139)

## 2021-12-10 LAB — VITAMIN B12: Vitamin B-12: 495 pg/mL (ref 232–1245)

## 2021-12-10 LAB — PHENOBARBITAL LEVEL: Phenobarbital, Serum: 43 ug/mL (ref 15–40)

## 2021-12-10 LAB — MAGNESIUM: Magnesium: 2.1 mg/dL (ref 1.6–2.3)

## 2021-12-10 MED ORDER — PHENOBARBITAL 100 MG PO TABS: ORAL_TABLET | ORAL | 3 refills | Status: DC

## 2021-12-10 NOTE — Addendum Note (Signed)
Addended by: Fayrene Helper on: 12/10/2021 08:51 PM   Modules accepted: Orders

## 2021-12-11 ENCOUNTER — Other Ambulatory Visit: Payer: Self-pay

## 2021-12-11 DIAGNOSIS — G40309 Generalized idiopathic epilepsy and epileptic syndromes, not intractable, without status epilepticus: Secondary | ICD-10-CM

## 2021-12-16 ENCOUNTER — Telehealth: Payer: Self-pay | Admitting: Family Medicine

## 2021-12-16 NOTE — Telephone Encounter (Signed)
Patient called need a humidifier prescription send to [facility]  or [employer]

## 2021-12-17 ENCOUNTER — Other Ambulatory Visit: Payer: Self-pay

## 2021-12-17 DIAGNOSIS — J301 Allergic rhinitis due to pollen: Secondary | ICD-10-CM

## 2021-12-17 MED ORDER — UNABLE TO FIND: 0 refills | Status: DC

## 2021-12-17 NOTE — Telephone Encounter (Signed)
Rx sent to [facility]

## 2021-12-21 ENCOUNTER — Other Ambulatory Visit: Payer: Self-pay | Admitting: Family Medicine

## 2021-12-25 DIAGNOSIS — H524 Presbyopia: Secondary | ICD-10-CM | POA: Diagnosis not present

## 2021-12-25 DIAGNOSIS — H52223 Regular astigmatism, bilateral: Secondary | ICD-10-CM | POA: Diagnosis not present

## 2022-01-13 ENCOUNTER — Other Ambulatory Visit: Payer: Self-pay

## 2022-01-13 ENCOUNTER — Telehealth: Payer: Self-pay | Admitting: Family Medicine

## 2022-01-13 MED ORDER — HUMIDIFIER MISC: 0 refills | Status: AC

## 2022-01-13 NOTE — Telephone Encounter (Signed)
Order sent to [facility]

## 2022-01-13 NOTE — Telephone Encounter (Signed)
Pt called stating that her insu told her they will cover for her to get a humidifier. She is wanting to know if you can please send in a order for this?  Pt wants largest one possible.   [facility] [place]

## 2022-01-16 LAB — PHENOBARBITAL LEVEL: Phenobarbital, Serum: 36 ug/mL (ref 15–40)

## 2022-01-26 ENCOUNTER — Emergency Department (HOSPITAL_COMMUNITY): Payer: Medicare Other

## 2022-01-26 ENCOUNTER — Other Ambulatory Visit: Payer: Self-pay

## 2022-01-26 ENCOUNTER — Encounter (HOSPITAL_COMMUNITY): Payer: Self-pay | Admitting: Emergency Medicine

## 2022-01-26 ENCOUNTER — Inpatient Hospital Stay (HOSPITAL_COMMUNITY)
Admission: EM | Admit: 2022-01-26 | Discharge: 2022-02-02 | DRG: 521 | Disposition: A | Discharge status: Skilled Nursing Facility | Payer: Medicare Other | Attending: Internal Medicine | Admitting: Internal Medicine

## 2022-01-26 DIAGNOSIS — I10 Essential (primary) hypertension: Secondary | ICD-10-CM | POA: Diagnosis not present

## 2022-01-26 DIAGNOSIS — S72042A Displaced fracture of base of neck of left femur, initial encounter for closed fracture: Secondary | ICD-10-CM | POA: Diagnosis present

## 2022-01-26 DIAGNOSIS — R339 Retention of urine, unspecified: Secondary | ICD-10-CM | POA: Diagnosis not present

## 2022-01-26 DIAGNOSIS — R2689 Other abnormalities of gait and mobility: Secondary | ICD-10-CM | POA: Diagnosis not present

## 2022-01-26 DIAGNOSIS — M80852A Other osteoporosis with current pathological fracture, left femur, initial encounter for fracture: Secondary | ICD-10-CM | POA: Diagnosis not present

## 2022-01-26 DIAGNOSIS — Z01818 Encounter for other preprocedural examination: Secondary | ICD-10-CM | POA: Diagnosis not present

## 2022-01-26 DIAGNOSIS — G40309 Generalized idiopathic epilepsy and epileptic syndromes, not intractable, without status epilepticus: Secondary | ICD-10-CM | POA: Diagnosis not present

## 2022-01-26 DIAGNOSIS — Z888 Allergy status to other drugs, medicaments and biological substances status: Secondary | ICD-10-CM | POA: Diagnosis not present

## 2022-01-26 DIAGNOSIS — E871 Hypo-osmolality and hyponatremia: Secondary | ICD-10-CM | POA: Diagnosis present

## 2022-01-26 DIAGNOSIS — Z90710 Acquired absence of both cervix and uterus: Secondary | ICD-10-CM

## 2022-01-26 DIAGNOSIS — F418 Other specified anxiety disorders: Secondary | ICD-10-CM | POA: Diagnosis not present

## 2022-01-26 DIAGNOSIS — Z823 Family history of stroke: Secondary | ICD-10-CM

## 2022-01-26 DIAGNOSIS — Z79899 Other long term (current) drug therapy: Secondary | ICD-10-CM

## 2022-01-26 DIAGNOSIS — Z7983 Long term (current) use of bisphosphonates: Secondary | ICD-10-CM

## 2022-01-26 DIAGNOSIS — Z743 Need for continuous supervision: Secondary | ICD-10-CM | POA: Diagnosis not present

## 2022-01-26 DIAGNOSIS — W010XXA Fall on same level from slipping, tripping and stumbling without subsequent striking against object, initial encounter: Secondary | ICD-10-CM | POA: Diagnosis present

## 2022-01-26 DIAGNOSIS — S72002A Fracture of unspecified part of neck of left femur, initial encounter for closed fracture: Principal | ICD-10-CM

## 2022-01-26 DIAGNOSIS — J189 Pneumonia, unspecified organism: Secondary | ICD-10-CM | POA: Diagnosis not present

## 2022-01-26 DIAGNOSIS — M79605 Pain in left leg: Secondary | ICD-10-CM | POA: Diagnosis not present

## 2022-01-26 DIAGNOSIS — F32A Depression, unspecified: Secondary | ICD-10-CM | POA: Diagnosis present

## 2022-01-26 DIAGNOSIS — G8929 Other chronic pain: Secondary | ICD-10-CM | POA: Diagnosis not present

## 2022-01-26 DIAGNOSIS — E876 Hypokalemia: Secondary | ICD-10-CM | POA: Diagnosis not present

## 2022-01-26 DIAGNOSIS — F39 Unspecified mood [affective] disorder: Secondary | ICD-10-CM | POA: Diagnosis present

## 2022-01-26 DIAGNOSIS — M80052A Age-related osteoporosis with current pathological fracture, left femur, initial encounter for fracture: Principal | ICD-10-CM | POA: Diagnosis present

## 2022-01-26 DIAGNOSIS — Y92009 Unspecified place in unspecified non-institutional (private) residence as the place of occurrence of the external cause: Secondary | ICD-10-CM | POA: Diagnosis not present

## 2022-01-26 DIAGNOSIS — Z9181 History of falling: Secondary | ICD-10-CM | POA: Diagnosis not present

## 2022-01-26 DIAGNOSIS — F411 Generalized anxiety disorder: Secondary | ICD-10-CM | POA: Diagnosis present

## 2022-01-26 DIAGNOSIS — Z803 Family history of malignant neoplasm of breast: Secondary | ICD-10-CM | POA: Diagnosis not present

## 2022-01-26 DIAGNOSIS — W19XXXA Unspecified fall, initial encounter: Secondary | ICD-10-CM | POA: Diagnosis not present

## 2022-01-26 DIAGNOSIS — R531 Weakness: Secondary | ICD-10-CM | POA: Diagnosis not present

## 2022-01-26 DIAGNOSIS — Z8249 Family history of ischemic heart disease and other diseases of the circulatory system: Secondary | ICD-10-CM

## 2022-01-26 DIAGNOSIS — Z886 Allergy status to analgesic agent status: Secondary | ICD-10-CM | POA: Diagnosis not present

## 2022-01-26 DIAGNOSIS — S72042D Displaced fracture of base of neck of left femur, subsequent encounter for closed fracture with routine healing: Secondary | ICD-10-CM | POA: Diagnosis not present

## 2022-01-26 DIAGNOSIS — M6281 Muscle weakness (generalized): Secondary | ICD-10-CM | POA: Diagnosis not present

## 2022-01-26 DIAGNOSIS — M25552 Pain in left hip: Secondary | ICD-10-CM | POA: Diagnosis not present

## 2022-01-26 DIAGNOSIS — S72009A Fracture of unspecified part of neck of unspecified femur, initial encounter for closed fracture: Secondary | ICD-10-CM | POA: Diagnosis present

## 2022-01-26 DIAGNOSIS — G40409 Other generalized epilepsy and epileptic syndromes, not intractable, without status epilepticus: Secondary | ICD-10-CM | POA: Diagnosis present

## 2022-01-26 DIAGNOSIS — R9431 Abnormal electrocardiogram [ECG] [EKG]: Secondary | ICD-10-CM | POA: Diagnosis not present

## 2022-01-26 DIAGNOSIS — K5900 Constipation, unspecified: Secondary | ICD-10-CM | POA: Diagnosis not present

## 2022-01-26 DIAGNOSIS — Z9049 Acquired absence of other specified parts of digestive tract: Secondary | ICD-10-CM

## 2022-01-26 DIAGNOSIS — M85862 Other specified disorders of bone density and structure, left lower leg: Secondary | ICD-10-CM | POA: Diagnosis not present

## 2022-01-26 LAB — CBC WITH DIFFERENTIAL/PLATELET
Abs Immature Granulocytes: 0.07 10*3/uL (ref 0.00–0.07)
Basophils Absolute: 0.1 10*3/uL (ref 0.0–0.1)
Basophils Relative: 1 %
Eosinophils Absolute: 0.1 10*3/uL (ref 0.0–0.5)
Eosinophils Relative: 1 %
HCT: 36.8 % (ref 36.0–46.0)
Hemoglobin: 12.1 g/dL (ref 12.0–15.0)
Immature Granulocytes: 1 %
Lymphocytes Relative: 24 %
Lymphs Abs: 2 10*3/uL (ref 0.7–4.0)
MCH: 32.7 pg (ref 26.0–34.0)
MCHC: 32.9 g/dL (ref 30.0–36.0)
MCV: 99.5 fL (ref 80.0–100.0)
Monocytes Absolute: 1 10*3/uL (ref 0.1–1.0)
Monocytes Relative: 12 %
Neutro Abs: 5.2 10*3/uL (ref 1.7–7.7)
Neutrophils Relative %: 61 %
Platelets: 281 10*3/uL (ref 150–400)
RBC: 3.7 MIL/uL — ABNORMAL LOW (ref 3.87–5.11)
RDW: 11.5 % (ref 11.5–15.5)
WBC: 8.5 10*3/uL (ref 4.0–10.5)
nRBC: 0 % (ref 0.0–0.2)

## 2022-01-26 LAB — TYPE AND SCREEN
ABO/RH(D): O POS
ABO/RH(D): O POS
Antibody Screen: NEGATIVE
Antibody Screen: NEGATIVE

## 2022-01-26 LAB — BASIC METABOLIC PANEL
Anion gap: 7 (ref 5–15)
BUN: 6 mg/dL — ABNORMAL LOW (ref 8–23)
CO2: 28 mmol/L (ref 22–32)
Calcium: 9.4 mg/dL (ref 8.9–10.3)
Chloride: 98 mmol/L (ref 98–111)
Creatinine, Ser: 0.53 mg/dL (ref 0.44–1.00)
GFR, Estimated: >60 mL/min (ref >60)
Glucose, Bld: 129 mg/dL — ABNORMAL HIGH (ref 70–99)
Potassium: 3.7 mmol/L (ref 3.5–5.1)
Sodium: 133 mmol/L — ABNORMAL LOW (ref 135–145)

## 2022-01-26 LAB — PROTIME-INR
INR: 0.9 (ref 0.8–1.2)
Prothrombin Time: 12.3 seconds (ref 11.4–15.2)

## 2022-01-26 LAB — HIV ANTIBODY (ROUTINE TESTING W REFLEX): HIV Screen 4th Generation wRfx: NONREACTIVE

## 2022-01-26 MED ORDER — OXYCODONE HCL 5 MG PO TABS
5.0000 mg | ORAL_TABLET | ORAL | Status: DC | PRN
  Administered 2022-01-26 – 2022-01-27 (×3): 5 mg via ORAL
  Filled 2022-01-26 (×3): qty 1

## 2022-01-26 MED ORDER — SPIRONOLACTONE 25 MG PO TABS
50.0000 mg | ORAL_TABLET | Freq: Every day | ORAL | Status: DC
  Administered 2022-01-26 – 2022-02-02 (×8): 50 mg via ORAL
  Filled 2022-01-26 (×8): qty 2

## 2022-01-26 MED ORDER — ONDANSETRON HCL 4 MG PO TABS: 4.0000 mg | ORAL_TABLET | Freq: Four times a day (QID) | ORAL | Status: DC | PRN

## 2022-01-26 MED ORDER — CLONAZEPAM 1 MG PO TABS
1.0000 mg | ORAL_TABLET | Freq: Two times a day (BID) | ORAL | Status: DC | PRN
  Administered 2022-01-29 – 2022-02-01 (×4): 1 mg via ORAL
  Filled 2022-01-26 (×4): qty 1

## 2022-01-26 MED ORDER — ONDANSETRON HCL 4 MG/2ML IJ SOLN: 4.0000 mg | Freq: Four times a day (QID) | INTRAMUSCULAR | Status: DC | PRN

## 2022-01-26 MED ORDER — MORPHINE SULFATE (PF) 2 MG/ML IV SOLN
2.0000 mg | INTRAVENOUS | Status: DC | PRN
  Administered 2022-01-26 (×3): 2 mg via INTRAVENOUS
  Filled 2022-01-26 (×3): qty 1

## 2022-01-26 MED ORDER — FENTANYL CITRATE PF 50 MCG/ML IJ SOSY
50.0000 ug | PREFILLED_SYRINGE | INTRAMUSCULAR | Status: AC | PRN
  Administered 2022-01-26 (×2): 50 ug via INTRAVENOUS
  Filled 2022-01-26 (×2): qty 1

## 2022-01-26 MED ORDER — HEPARIN SODIUM (PORCINE) 5000 UNIT/ML IJ SOLN
5000.0000 [IU] | Freq: Three times a day (TID) | INTRAMUSCULAR | Status: DC
  Administered 2022-01-26 (×3): 5000 [IU] via SUBCUTANEOUS
  Filled 2022-01-26 (×3): qty 1

## 2022-01-26 MED ORDER — ACETAMINOPHEN 325 MG PO TABS
650.0000 mg | ORAL_TABLET | Freq: Four times a day (QID) | ORAL | Status: DC | PRN
  Administered 2022-01-26: 650 mg via ORAL
  Filled 2022-01-26 (×2): qty 2

## 2022-01-26 MED ORDER — ORAL CARE MOUTH RINSE: 15.0000 mL | OROMUCOSAL | Status: DC | PRN

## 2022-01-26 MED ORDER — CEFAZOLIN SODIUM-DEXTROSE 2-4 GM/100ML-% IV SOLN
2.0000 g | INTRAVENOUS | Status: AC
  Administered 2022-01-27: 2 g via INTRAVENOUS

## 2022-01-26 MED ORDER — PHENOBARBITAL 32.4 MG PO TABS
97.2000 mg | ORAL_TABLET | ORAL | Status: DC
  Administered 2022-01-30 – 2022-01-31 (×2): 97.2 mg via ORAL
  Filled 2022-01-26 (×2): qty 3

## 2022-01-26 MED ORDER — AMLODIPINE BESYLATE 5 MG PO TABS
5.0000 mg | ORAL_TABLET | Freq: Every day | ORAL | Status: DC
  Administered 2022-01-26 – 2022-02-02 (×8): 5 mg via ORAL
  Filled 2022-01-26 (×8): qty 1

## 2022-01-26 MED ORDER — TRANEXAMIC ACID-NACL 1000-0.7 MG/100ML-% IV SOLN
1000.0000 mg | INTRAVENOUS | Status: AC
  Administered 2022-01-27: 1000 mg via INTRAVENOUS

## 2022-01-26 MED ORDER — ACETAMINOPHEN 650 MG RE SUPP: 650.0000 mg | Freq: Four times a day (QID) | RECTAL | Status: DC | PRN

## 2022-01-26 MED ORDER — CHLORHEXIDINE GLUCONATE 4 % EX LIQD
60.0000 mL | Freq: Once | CUTANEOUS | Status: AC
  Administered 2022-01-27: 4 via TOPICAL
  Filled 2022-01-26: qty 60

## 2022-01-26 MED ORDER — POVIDONE-IODINE 10 % EX SWAB: 2.0000 | Freq: Once | CUTANEOUS | Status: DC

## 2022-01-26 MED ORDER — MORPHINE SULFATE (PF) 2 MG/ML IV SOLN
1.0000 mg | INTRAVENOUS | Status: DC | PRN
  Administered 2022-01-26 (×2): 2 mg via INTRAVENOUS
  Filled 2022-01-26 (×2): qty 1

## 2022-01-26 MED ORDER — MIRTAZAPINE 15 MG PO TABS
45.0000 mg | ORAL_TABLET | Freq: Every day | ORAL | Status: DC
  Administered 2022-01-26 – 2022-02-01 (×7): 45 mg via ORAL
  Filled 2022-01-26 (×7): qty 3

## 2022-01-26 MED ORDER — PHENOBARBITAL 32.4 MG PO TABS
97.2000 mg | ORAL_TABLET | ORAL | Status: DC
  Administered 2022-01-26 – 2022-02-02 (×11): 97.2 mg via ORAL
  Filled 2022-01-26 (×11): qty 3

## 2022-01-26 MED ORDER — LEVETIRACETAM 500 MG PO TABS
1000.0000 mg | ORAL_TABLET | Freq: Two times a day (BID) | ORAL | Status: DC
  Administered 2022-01-26 – 2022-02-02 (×15): 1000 mg via ORAL
  Filled 2022-01-26 (×15): qty 2

## 2022-01-26 NOTE — ED Triage Notes (Signed)
Pt BIB [facility] from home c/o left hip pain after mechanical fall tonight. Pt with obvious outward rotation to left leg.

## 2022-01-26 NOTE — H&P (Signed)
History and Physical    Patient: Sandra Riley DOB: 1960-10-02 DOA: 01/26/2022 DOS: the patient was seen and examined on 01/26/2022 PCP: Fayrene Helper, MD  Patient coming from: Home  Chief Complaint:  Chief Complaint  Patient presents with   Fall   HPI: Sandra Riley is a 61 y.o. female with medical history significant of anxiety and depression, epilepsy, hypertension, and more presents ED with a chief complaint of leg pain.  Patient reports that she got up in the night to go to the bathroom when she had a slip and fall in the hallway.  She denies any dizziness, chest pain, palpitations, dyspnea.  She did not hit her head.  She is not on blood thinners.  She did not lose consciousness.  Patient was otherwise in her normal state of health and reports that she had a normal Christmas.  Patient has no other complaints at this time.  Patient does have a seizure disorder.  She cannot remember when the last time she had a seizure was.  She reports she usually does feel an aura prior to his seizure.  Patient does not smoke, does not drink.  She is vaccinated for COVID.  Patient is full code. Review of Systems: As mentioned in the history of present illness. All other systems reviewed and are negative. Past Medical History:  Diagnosis Date   Abnormal facial hair 05/11/2011   Anxiety and depression    GAD (generalized anxiety disorder)    Insomnia    Psychotic disorder (HCC)    Seizure disorder ([place])    stress related seizures. 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 She has never used smokeless tobacco. She reports that she does not drink alcohol and does not use drugs.  Allergies  Allergen Reactions   Aspirin     Allergic to non coated ASA   Singulair [Montelukast Sodium] Other (See Comments)    Became sweaty and nuaseated    Family History  Problem Relation Age of Onset   Breast cancer Mother 103   Coronary artery disease Father    Stroke Father 33   Breast cancer Sister 76   Breast cancer Sister    Breast cancer Sister    Diabetes Brother    Heart attack Brother     Prior to Admission medications   Medication Sig Start Date End Date Taking? Authorizing Provider  Humidifier MISC Large humidifier. 01/13/22   Fayrene Helper, MD  acetaminophen (TYLENOL) 500 MG tablet Take 500 mg by mouth every 6 (six) hours as needed for moderate pain.     [provider]  alendronate (FOSAMAX) 35 MG tablet Take 1 tablet (35 mg total) by mouth every 7 (seven) days. Take with a full glass of water on an empty stomach. 07/17/21   Fayrene Helper, MD  amLODipine (NORVASC) 5 MG tablet TAKE ONE TABLET BY MOUTH DAILY 12/21/21   Moshe Cipro,  Norwood Levo, MD  B Complex-C-Folic Acid (HM SUPER VITAMIN B COMPLEX/C PO) Take by mouth daily.    [provider]  BIOTIN 5000 PO Take 5,000 mcg by mouth once. daily    [provider]  Calcium Carb-Cholecalciferol 1000-800 MG-UNIT TABS Take 1 tablet by mouth 3 (three) times daily.     [provider]  Cholecalciferol (VITAMIN D) 50 MCG (2000 UT) CAPS Take 1 capsule by mouth daily.    [provider]  clonazePAM (KLONOPIN) 1 MG tablet Take 1 tablet (1 mg total) by mouth 2 (two) times daily as needed for anxiety. 12/09/21   Fayrene Helper, MD  ferrous sulfate 325 (65 FE) MG tablet Take 325 mg by mouth once. daily    [provider]  levETIRAcetam (KEPPRA)  1000 MG tablet SMARTSIG:1.5 Tablet(s) By Mouth Every 12 Hours 03/09/21   [provider]  MAGNESIUM PO Take 500 mg by mouth daily.     [provider]  mirtazapine (REMERON) 45 MG tablet Take 1 tablet (45 mg total) by mouth at bedtime. 07/17/21   Fayrene Helper, MD  Multiple Vitamin (MULTIVITAMIN) tablet Take 1 tablet by mouth daily.    [provider]  PHENObarbital (LUMINAL) 100 MG tablet Take one tablet twice daily Monday through Friday , and take one tablet once daily Saturday and Sunday 12/10/21   Fayrene Helper, MD  Potassium 99 MG TABS Take 99 mg by mouth daily.     [provider]  spironolactone (ALDACTONE) 50 MG tablet Take 1 tablet (50 mg total) by mouth daily. 10/21/20   Fayrene Helper, MD  UNABLE TO FIND Bed rail x 1 DX: 56.9 12/09/21   Fayrene Helper, MD  UNABLE TO FIND Humidifier DX: 30.1 12/17/21   Fayrene Helper, MD  zinc gluconate 50 MG tablet Take 50 mg by mouth daily.    [provider]    Physical Exam: Vitals:   01/26/22 0400 01/26/22 0430 01/26/22 0500 01/26/22 0530  BP: (!) 155/78 (!) 156/67 (!) 167/73 (!) 153/71  Pulse: 84 78 92 99  Resp: '14 15 16 17  '$ Temp:      TempSrc:      SpO2: 92% 95% 96% 92%  Weight:      Height:       1.  General: Patient lying supine in bed,  no acute distress   2. Psychiatric: Alert and oriented x 3, mood and behavior normal for situation, pleasant and cooperative with exam   3. Neurologic: Speech and language are normal, face is symmetric, moves all 4 extremities voluntarily, at baseline without acute deficits on limited exam   4. HEENMT:  Head is atraumatic, normocephalic, pupils reactive to light, neck is supple, trachea is midline, mucous membranes are moist   5. Respiratory : Lungs are clear to auscultation bilaterally without wheezing, rhonchi, rales, no cyanosis, no increase in work of breathing or accessory muscle use   6. Cardiovascular : Heart rate  normal, rhythm is regular, no murmurs, rubs or gallops, no peripheral edema, peripheral pulses palpated   7. Gastrointestinal:  Abdomen is soft, nondistended, nontender to palpation bowel sounds active, no masses or organomegaly palpated   8. Skin:  Skin is warm, dry and intact without rashes, acute lesions, or ulcers on limited exam   9.Musculoskeletal:  Left leg is externally rotated and AB ducted  Data Reviewed: In the ED Temp 98.3, heart rate 62, respiratory rate 18, blood pressure 159/67, satting at  97% No leukocytosis with white blood cell count of 8.5, hemoglobin 12.1 Chemistry unremarkable Chest x-ray negative for acute disease Left hip imaging shows acute minimally displaced left femoral neck fracture Admission requested at [facility] for Ortho.  Assessment and Plan: * Hip fracture ([place]) - Acute minimally displaced left femoral neck fracture - Ortho consulted at [facility] and request admission at [facility] - Mechanical fall - Pain control with pain scale - N.p.o. - Continue to monitor  Generalized convulsive epilepsy ([place]) - Continue Keppra and luminal  Essential hypertension - Continue Norvasc and Aldactone  Moderate mood disorder (HCC) - Continue Remeron, Klonopin as needed - Continue to monitor      Advance Care Planning:   Code Status: Full Code  Consults: Ortho  Family Communication: No family at bedside  Severity of Illness: The appropriate patient status for this patient is INPATIENT. Inpatient status is judged to be reasonable and necessary in order to provide the required intensity of service to ensure the patient's safety. The patient's presenting symptoms, physical exam findings, and initial radiographic and laboratory data in the context of their chronic comorbidities is felt to place them at high risk for further clinical deterioration. Furthermore, it is not anticipated that the patient will be medically stable for discharge from the hospital  within 2 midnights of admission.   * I certify that at the point of admission it is my clinical judgment that the patient will require inpatient hospital care spanning beyond 2 midnights from the point of admission due to high intensity of service, high risk for further deterioration and high frequency of surveillance required.*  Author: Rolla Plate, DO 01/26/2022 6:05 AM  For on call review [email or web address].

## 2022-01-26 NOTE — Assessment & Plan Note (Signed)
-   Continue Norvasc and Aldactone

## 2022-01-26 NOTE — ED Notes (Signed)
Pts belongings and purse sent with pt to [facility], per pt request her husband was called to inform of her transfer, this RN attempted to call Azalee Course at the # on file for an update

## 2022-01-26 NOTE — Assessment & Plan Note (Signed)
-   Continue Remeron, Klonopin as needed - Continue to monitor

## 2022-01-26 NOTE — Assessment & Plan Note (Signed)
-   Acute minimally displaced left femoral neck fracture - Ortho consulted at [facility] and request admission at [facility] - Mechanical fall - Pain control with pain scale - N.p.o. - Continue to monitor

## 2022-01-26 NOTE — Assessment & Plan Note (Signed)
-   Continue Keppra and luminal

## 2022-01-26 NOTE — ED Notes (Signed)
Patient denies pain and is resting comfortably.  

## 2022-01-26 NOTE — Progress Notes (Signed)
PROGRESS NOTE    Sandra Riley   QMG:[id number] DOB: August 20, 1960  DOA: 01/26/2022 Date of Service: 01/26/22 PCP: Fayrene Helper, MD     Brief Narrative / Hospital Course:  Sandra Riley is a 61 y.o. female with medical history significant of anxiety and depression, epilepsy, hypertension, and more presents ED with a chief complaint of leg pain.  Patient reports that she got up in the night to go to the bathroom when she had a slip and fall in the hallway. No head trauma or LOC. Patient does have a seizure disorder. She cannot remember when the last time she had a seizure was. She reports she usually does feel an aura prior to a seizure.  12/26: XR in ED (+)Acute minimally displaced left femoral neck fracture ortho recommends [facility] 12/27 arrived at [facility], planned for surgery tomorrow     Consultants:  orthopedics  Procedures: Planned fixation L hip fracture 12/27      ASSESSMENT & PLAN:   Principal Problem:   Hip fracture ([place]) Active Problems:   Moderate mood disorder (HCC)   Essential hypertension   Generalized convulsive epilepsy ([place])  Acute minimally displaced left femoral neck fracture Ortho consulted, admission at [facility] from [facility], plan THA tomorrow w/ Dr Erlinda Hong  Pain control with pain scale N.p.o. per ortho  Moderate mood disorder (HCC) Continue Remeron, Klonopin as needed  Essential hypertension Continue Norvasc and Aldactone  Generalized convulsive epilepsy ([place]) Continue Keppra and luminal    DVT prophylaxis: heparin preop  Pertinent IV fluids/nutrition:  Central lines / invasive devices: none  Code Status: FULL CODE   Disposition: inpatient  TOC needs: will likely need STR/HH Barriers to discharge / significant pending items: surgery tomorrow              Subjective:  Patient reports no concerns at this time  Denies CP/SOB.  Pain controlled.  Denies new weakness.  Tolerating diet.  Reports  no concerns w/ urination/defecation.   Family Communication: none at this time     Objective Findings:  Vitals:   01/26/22 0830 01/26/22 0852 01/26/22 0938 01/26/22 1326  BP: (!) 152/59  (!) 152/60 136/63  Pulse: (!) 101  (!) 103 100  Resp: '18  18 19  '$ Temp:  98.3 F (36.8 C) 99.3 F (37.4 C) 99.6 F (37.6 C)  TempSrc:  Oral Oral Oral  SpO2: 96%  100% 98%  Weight:      Height:       No intake or output data in the 24 hours ending 01/26/22 1459 Filed Weights   01/26/22 0201  Weight: 63 kg    Examination:  Constitutional:  VS as above General Appearance: alert, well-developed, well-nourished, NAD Respiratory: Normal respiratory effort No wheeze No rhonchi No rales Cardiovascular: S1/S2 normal RRR No lower extremity edema Gastrointestinal: No tenderness Neurological: No cranial nerve deficit on limited exam Alert Psychiatric: Normal judgment/insight Normal mood and affect       Scheduled Medications:   amLODipine  5 mg Oral Daily   heparin  5,000 Units Subcutaneous Q8H   levETIRAcetam  1,000 mg Oral BID   mirtazapine  45 mg Oral QHS   PHENobarbital  97.2 mg Oral 2 times per day on Mon Tue Wed Thu Fri   [START ON 01/30/2022] phenobarbital  97.2 mg Oral Once per day on Sun Sat   spironolactone  50 mg Oral Daily    Continuous Infusions:   PRN Medications:  acetaminophen **OR** acetaminophen,  clonazePAM, morphine injection, ondansetron **OR** ondansetron (ZOFRAN) IV, mouth rinse, oxyCODONE  Antimicrobials:  Anti-infectives (From admission, onward)    None           Data Reviewed: I have personally reviewed following labs and imaging studies  CBC: Recent Labs  Lab 01/26/22 0322  WBC 8.5  NEUTROABS 5.2  HGB 12.1  HCT 36.8  MCV 99.5  PLT 277   Basic Metabolic Panel: Recent Labs  Lab 01/26/22 0322  NA 133*  K 3.7  CL 98  CO2 28  GLUCOSE 129*  BUN 6*  CREATININE 0.53  CALCIUM 9.4   GFR: Estimated Creatinine Clearance:  62.8 mL/min (by C-G formula based on SCr of 0.53 mg/dL). Liver Function Tests: No results for input(s): "AST", "ALT", "ALKPHOS", "BILITOT", "PROT", "ALBUMIN" in the last 168 hours. No results for input(s): "LIPASE", "AMYLASE" in the last 168 hours. No results for input(s): "AMMONIA" in the last 168 hours. Coagulation Profile: Recent Labs  Lab 01/26/22 0322  INR 0.9   Cardiac Enzymes: No results for input(s): "CKTOTAL", "CKMB", "CKMBINDEX", "TROPONINI" in the last 168 hours. BNP (last 3 results) No results for input(s): "PROBNP" in the last 8760 hours. HbA1C: No results for input(s): "HGBA1C" in the last 72 hours. CBG: No results for input(s): "GLUCAP" in the last 168 hours. Lipid Profile: No results for input(s): "CHOL", "HDL", "LDLCALC", "TRIG", "CHOLHDL", "LDLDIRECT" in the last 72 hours. Thyroid Function Tests: No results for input(s): "TSH", "T4TOTAL", "FREET4", "T3FREE", "THYROIDAB" in the last 72 hours. Anemia Panel: No results for input(s): "VITAMINB12", "FOLATE", "FERRITIN", "TIBC", "IRON", "RETICCTPCT" in the last 72 hours. Most Recent Urinalysis On File:     Component Value Date/Time   COLORURINE YELLOW 05/08/2008 0309   APPEARANCEUR CLEAR 05/08/2008 0309   LABSPEC 1.014 05/08/2008 0309   PHURINE 6.0 05/08/2008 0309   GLUCOSEU NEG mg/dL 05/08/2008 0309   BILIRUBINUR NEG 05/08/2008 0309   KETONESUR NEG mg/dL 05/08/2008 0309   PROTEINUR NEG mg/dL 05/08/2008 0309   UROBILINOGEN 1 05/08/2008 0309   NITRITE NEG 05/08/2008 0309   LEUKOCYTESUR NEG 05/08/2008 0309   Sepsis Labs: '@LABRCNTIP'$ (procalcitonin:4,lacticidven:4)  No results found for this or any previous visit (from the past 240 hour(s)).       Radiology Studies: DG Chest 1 View  Result Date: 01/26/2022 CLINICAL DATA:  Preop EXAM: CHEST  1 VIEW COMPARISON:  None Available. FINDINGS: Heart size upper limits of normal. No focal consolidation, pleural effusion, or pneumothorax. No acute osseous abnormality.  IMPRESSION: No active disease. Electronically Signed   By: Placido Sou M.D.   On: 01/26/2022 03:10   DG Hip Unilat W or Wo Pelvis 2-3 Views Left  Result Date: 01/26/2022 CLINICAL DATA:  Left hip pain after fall EXAM: DG HIP (WITH OR WITHOUT PELVIS) 2-3V LEFT COMPARISON:  None Available. FINDINGS: Irregularity about the left femoral neck with foreshortening compatible acute femoral neck fracture. Body habitus limits assessment on lateral view. IMPRESSION: Acute minimally displaced left femoral neck fracture. Electronically Signed   By: Placido Sou M.D.   On: 01/26/2022 03:02            LOS: 0 days       Emeterio Reeve, DO [facility] 01/26/2022, 2:59 PM    Dictation software may have been used to generate the above note. Typos may occur and escape review in typed/dictated notes. Please contact Dr Sheppard Coil directly for clarity if needed.  Staff may message me via secure chat in [place]  but this may not receive an  immediate response,  please page me for urgent matters!  If 7PM-7AM, please contact night coverage [email or web address]

## 2022-01-26 NOTE — ED Notes (Signed)
Report given to [facility]. 

## 2022-01-26 NOTE — Consult Note (Signed)
Reason for Consult:Left hip fx Referring Physician: Emeterio Reeve Time called: 1104 Time at bedside: [place] is an 61 y.o. female.  HPI: Sandra Riley was going to the bathroom last night when she tripped and fell. She had immediate left hip pain and could not get up. She was brought to the ED where x-rays showed a left hip fx and orthopedic surgery was consulted. She lives at home with her husband and ambulates with a RW.  Past Medical History:  Diagnosis Date   Abnormal facial hair 05/11/2011   Anxiety and depression    GAD (generalized anxiety disorder)    Insomnia    Psychotic disorder (HCC)    Seizure disorder ([place])    stress related seizures. 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[facility])     Past Surgical History:  Procedure Laterality Date   ABDOMINAL HYSTERECTOMY     CESAREAN SECTION     CHOLECYSTECTOMY     COLONOSCOPY WITH PROPOFOL N/A 02/15/2019   Procedure: COLONOSCOPY WITH PROPOFOL;  Surgeon: Daneil Dolin, MD;  Location: [facility];  Service: Endoscopy;  Laterality: N/A;  2:45pm   POLYPECTOMY  02/15/2019   Procedure: POLYPECTOMY;  Surgeon: Daneil Dolin, MD;  Location: [facility];  Service: Endoscopy;;    Family History  Problem Relation Age of Onset   Breast cancer Mother 34   Coronary artery disease Father    Stroke Father 62   Breast cancer Sister 68   Breast cancer Sister    Breast cancer Sister    Diabetes Brother    Heart attack Brother     Social History:  reports that she has never smoked. She has never used smokeless tobacco. She reports that she does not drink alcohol and does not use drugs.  Allergies:  Allergies  Allergen Reactions   Aspirin     Allergic to non coated ASA   Nsaids Other (See Comments)    Directed by MD to not take.    Singulair [Montelukast Sodium] Other (See Comments)    Became sweaty and nuaseated    Medications: I have reviewed the patient's current medications.  Results for  orders placed or performed during the hospital encounter of 01/26/22 (from the past 48 hour(s))  Basic metabolic panel     Status: Abnormal   Collection Time: 01/26/22  3:22 AM  Result Value Ref Range   Sodium 133 (L) 135 - 145 mmol/L   Potassium 3.7 3.5 - 5.1 mmol/L   Chloride 98 98 - 111 mmol/L   CO2 28 22 - 32 mmol/L   Glucose, Bld 129 (H) 70 - 99 mg/dL    Comment: Glucose reference range applies only to samples taken after fasting for at least 8 hours.   BUN 6 (L) 8 - 23 mg/dL   Creatinine, Ser 0.53 0.44 - 1.00 mg/dL   Calcium 9.4 8.9 - 10.3 mg/dL   GFR, Estimated >60 >60 mL/min    Comment: (NOTE) Calculated using the CKD-EPI Creatinine Equation (2021)    Anion gap 7 5 - 15    Comment: Performed at [facility], [address], [place], [place] [address]  CBC with Differential     Status: Abnormal   Collection Time: 01/26/22  3:22 AM  Result Value Ref Range   WBC 8.5 4.0 - 10.5 K/uL   RBC 3.70 (L) 3.87 - 5.11 MIL/uL   Hemoglobin 12.1 12.0 - 15.0 g/dL   HCT 36.8  36.0 - 46.0 %   MCV 99.5 80.0 - 100.0 fL   MCH 32.7 26.0 - 34.0 pg   MCHC 32.9 30.0 - 36.0 g/dL   RDW 11.5 11.5 - 15.5 %   Platelets 281 150 - 400 K/uL   nRBC 0.0 0.0 - 0.2 %   Neutrophils Relative % 61 %   Neutro Abs 5.2 1.7 - 7.7 K/uL   Lymphocytes Relative 24 %   Lymphs Abs 2.0 0.7 - 4.0 K/uL   Monocytes Relative 12 %   Monocytes Absolute 1.0 0.1 - 1.0 K/uL   Eosinophils Relative 1 %   Eosinophils Absolute 0.1 0.0 - 0.5 K/uL   Basophils Relative 1 %   Basophils Absolute 0.1 0.0 - 0.1 K/uL   Immature Granulocytes 1 %   Abs Immature Granulocytes 0.07 0.00 - 0.07 K/uL    Comment: Performed at [facility], [address], [place], [place] [address]  Protime-INR     Status: None   Collection Time: 01/26/22  3:22 AM  Result Value Ref Range   Prothrombin Time 12.3 11.4 - 15.2 seconds   INR 0.9 0.8 - 1.2    Comment: (NOTE) INR goal varies based on device and disease states. Performed at [facility],  [address], [place], [place] [address]   Type and screen [facility]     Status: None   Collection Time: 01/26/22  3:22 AM  Result Value Ref Range   ABO/RH(D) O POS    Antibody Screen NEG    Sample Expiration      01/29/2022,2359 Performed at [facility], [address], [place], [place] [address]   HIV Antibody (routine testing w rflx)     Status: None   Collection Time: 01/26/22  3:22 AM  Result Value Ref Range   HIV Screen 4th Generation wRfx Non Reactive Non Reactive    Comment: Performed at [facility], [place] [address] [place], [place] [address]    DG Chest 1 View  Result Date: 01/26/2022 CLINICAL DATA:  Preop EXAM: CHEST  1 VIEW COMPARISON:  None Available. FINDINGS: Heart size upper limits of normal. No focal consolidation, pleural effusion, or pneumothorax. No acute osseous abnormality. IMPRESSION: No active disease. Electronically Signed   By: Placido Sou M.D.   On: 01/26/2022 03:10   DG Hip Unilat W or Wo Pelvis 2-3 Views Left  Result Date: 01/26/2022 CLINICAL DATA:  Left hip pain after fall EXAM: DG HIP (WITH OR WITHOUT PELVIS) 2-3V LEFT COMPARISON:  None Available. FINDINGS: Irregularity about the left femoral neck with foreshortening compatible acute femoral neck fracture. Body habitus limits assessment on lateral view. IMPRESSION: Acute minimally displaced left femoral neck fracture. Electronically Signed   By: Placido Sou M.D.   On: 01/26/2022 03:02    Review of Systems  HENT:  Negative for ear discharge, ear pain, hearing loss and tinnitus.   Eyes:  Negative for photophobia and pain.  Respiratory:  Negative for cough and shortness of breath.   Cardiovascular:  Negative for chest pain.  Gastrointestinal:  Negative for abdominal pain, nausea and vomiting.  Genitourinary:  Negative for dysuria, flank pain, frequency and urgency.  Musculoskeletal:  Positive for arthralgias (Left hip). Negative for back pain, myalgias and neck pain.  Neurological:   Negative for dizziness and headaches.  Hematological:  Does not bruise/bleed easily.  Psychiatric/Behavioral:  The patient is not nervous/anxious.    Blood pressure (!) 152/60, pulse (!) 103, temperature 99.3 F (37.4 C), temperature  source Oral, resp. rate 18, height '5\' 2"'$  (1.575 m), weight 63 kg, SpO2 100 %. Physical Exam Constitutional:      General: She is not in acute distress.    Appearance: She is well-developed. She is not diaphoretic.  HENT:     Head: Normocephalic and atraumatic.  Eyes:     General: No scleral icterus.       Right eye: No discharge.        Left eye: No discharge.     Conjunctiva/sclera: Conjunctivae normal.  Cardiovascular:     Rate and Rhythm: Normal rate and regular rhythm.  Pulmonary:     Effort: Pulmonary effort is normal. No respiratory distress.  Musculoskeletal:     Cervical back: Normal range of motion.     Comments: LLE No traumatic wounds, ecchymosis, or rash  Mild TTP hip  No knee or ankle effusion  Knee stable to varus/ valgus and anterior/posterior stress  Sens DPN, SPN, TN intact  Motor EHL, ext, flex, evers 5/5  DP 2+, PT 2+, No significant edema  Skin:    General: Skin is warm and dry.  Neurological:     Mental Status: She is alert.  Psychiatric:        Mood and Affect: Mood normal.        Behavior: Behavior normal.     Assessment/Plan: Left hip fx -- Plan THA tomorrow with Dr. Erlinda Hong. Please keep NPO.    Lisette Abu, PA-C Orthopedic Surgery [phone] 01/26/2022, 11:49 AM

## 2022-01-26 NOTE — ED Provider Notes (Signed)
[facility] EMERGENCY DEPARTMENT  Provider Note  CSN: [id number] Arrival date & time: 01/26/22 0157  History Chief Complaint  Patient presents with   [facility] Ed Rayson is a 61 y.o. female reports she slipped and fell at home on her way to the restroom during the night falling onto her L hip. No head injury or LOC. Complaining of pain to L hip with movement. Brought by EMS but not given analgesia enroute.    Home Medications Prior to Admission medications   Medication Sig Start Date End Date Taking? Authorizing Provider  Humidifier MISC Large humidifier. 01/13/22   Fayrene Helper, MD  acetaminophen (TYLENOL) 500 MG tablet Take 500 mg by mouth every 6 (six) hours as needed for moderate pain.     [provider]  alendronate (FOSAMAX) 35 MG tablet Take 1 tablet (35 mg total) by mouth every 7 (seven) days. Take with a full glass of water on an empty stomach. 07/17/21   Fayrene Helper, MD  amLODipine (NORVASC) 5 MG tablet TAKE ONE TABLET BY MOUTH DAILY 12/21/21   Fayrene Helper, MD  B Complex-C-Folic Acid (HM SUPER VITAMIN B COMPLEX/C PO) Take by mouth daily.    [provider]  BIOTIN 5000 PO Take 5,000 mcg by mouth once. daily    [provider]  Calcium Carb-Cholecalciferol 1000-800 MG-UNIT TABS Take 1 tablet by mouth 3 (three) times daily.     [provider]  Cholecalciferol (VITAMIN D) 50 MCG (2000 UT) CAPS Take 1 capsule by mouth daily.    [provider]  clonazePAM (KLONOPIN) 1 MG tablet Take 1 tablet (1 mg total) by mouth 2 (two) times daily as needed for anxiety. 12/09/21   Fayrene Helper, MD  ferrous sulfate 325 (65 FE) MG tablet Take 325 mg by mouth once. daily    [provider]  levETIRAcetam (KEPPRA) 1000 MG tablet SMARTSIG:1.5 Tablet(s) By Mouth Every 12 Hours 03/09/21   [provider]  MAGNESIUM PO Take 500 mg by mouth daily.     [provider]  mirtazapine (REMERON) 45 MG  tablet Take 1 tablet (45 mg total) by mouth at bedtime. 07/17/21   Fayrene Helper, MD  Multiple Vitamin (MULTIVITAMIN) tablet Take 1 tablet by mouth daily.    [provider]  PHENObarbital (LUMINAL) 100 MG tablet Take one tablet twice daily Monday through Friday , and take one tablet once daily Saturday and Sunday 12/10/21   Fayrene Helper, MD  Potassium 99 MG TABS Take 99 mg by mouth daily.     [provider]  spironolactone (ALDACTONE) 50 MG tablet Take 1 tablet (50 mg total) by mouth daily. 10/21/20   Fayrene Helper, MD  UNABLE TO FIND Bed rail x 1 DX: 56.9 12/09/21   Fayrene Helper, MD  UNABLE TO FIND Humidifier DX: 30.1 12/17/21   Fayrene Helper, MD  zinc gluconate 50 MG tablet Take 50 mg by mouth daily.    [provider]     Allergies    Aspirin and Singulair [montelukast sodium]   Review of Systems   Review of Systems Please see HPI for pertinent positives and negatives  Physical Exam BP (!) 159/67 (BP Location: Left Arm)   Pulse 62   Temp 98.3 F (36.8 C) (Oral)   Resp 18   Ht '5\' 2"'$  (1.575 m)   Wt 63 kg   SpO2 97%   BMI 25.40 kg/m  Physical Exam Vitals and nursing note reviewed.  Constitutional:      Appearance: Normal appearance.  HENT:     Head: Normocephalic and atraumatic.     Nose: Nose normal.     Mouth/Throat:     Mouth: Mucous membranes are moist.  Eyes:     Extraocular Movements: Extraocular movements intact.     Conjunctiva/sclera: Conjunctivae normal.  Cardiovascular:     Rate and Rhythm: Normal rate.     Pulses: Normal pulses.  Pulmonary:     Effort: Pulmonary effort is normal.     Breath sounds: Normal breath sounds.  Abdominal:     General: Abdomen is flat.     Palpations: Abdomen is soft.     Tenderness: There is no abdominal tenderness.  Musculoskeletal:        General: Deformity (L leg is shortened and rotated) present. No swelling.     Cervical back: Neck supple.  Skin:    General:  Skin is warm and dry.  Neurological:     General: No focal deficit present.     Mental Status: She is alert.     Cranial Nerves: No cranial nerve deficit.     Sensory: No sensory deficit.     Motor: No weakness.  Psychiatric:        Mood and Affect: Mood normal.     ED Results / Procedures / Treatments   EKG EKG Interpretation  Date/Time:  Tuesday January 26 2022 03:47:08 EST Ventricular Rate:  76 PR Interval:  218 QRS Duration: 87 QT Interval:  384 QTC Calculation: 432 R Axis:   42 Text Interpretation: Sinus rhythm Borderline prolonged PR interval Left atrial enlargement No significant change since last tracing Confirmed by Calvert Cantor [phone]) on 01/26/2022 4:34:52 AM  Procedures Procedures  Medications Ordered in the ED Medications  fentaNYL (SUBLIMAZE) injection 50 mcg (50 mcg Intravenous Given 01/26/22 0357)    Initial Impression and Plan  Patient here with mechanical fall and L hip pain with exam consistent with fracture. I personally viewed the images from radiology studies and agree with radiologist interpretation: hip xray is positive for fx. Will order pre-op labs and discuss with Ortho.    ED Course   Clinical Course as of 01/26/22 0437  Tue Jan 26, 2022  0330 Spoke with Dr. Marlou Sa, Ortho covering [facility] today from [place]. He requests the patient be admitted to Hospitalist at [facility].  [CS]  [id number] BMP is unremarkable. Hospitalist paged for admission.  [CS]  [phone] Spoke with Dr. Dr. Ander Slade, Hospitalist, who will evaluate for admission at [facility]. [CS]    Clinical Course User Index [CS] Truddie Hidden, MD     MDM Rules/Calculators/A&P Medical Decision Making Problems Addressed: Closed fracture of left hip, initial encounter [facility]): acute illness or injury  Amount and/or Complexity of Data Reviewed Labs: ordered. Decision-making details documented in ED Course. Radiology: ordered and independent interpretation performed. Decision-making details documented  in ED Course. ECG/medicine tests: ordered and independent interpretation performed. Decision-making details documented in ED Course.  Risk Prescription drug management. Parenteral controlled substances. Decision regarding hospitalization.    Final Clinical Impression(s) / ED Diagnoses Final diagnoses:  Closed fracture of left hip, initial encounter [name])    Rx / [place] Orders ED Discharge Orders     None        Truddie Hidden, MD 01/26/22 [phone]

## 2022-01-26 NOTE — Hospital Course (Addendum)
61 y.o. female with medical history significant of anxiety and depression, epilepsy, hypertension, and more presents ED with a chief complaint of leg pain.  Patient reports that she got up in the night to go to the bathroom when she had a slip and fall in the hallway. No head trauma or LOC. Patient does have a seizure disorder. She cannot remember when the last time she had a seizure was. She reports she usually does feel an aura prior to a seizure. 12/26: XR in ED (+)Acute minimally displaced left femoral neck fracture ortho recommends transfer to [facility]. 12/27 arrived at [facility] and underwent left hip fracture surgical repair 12/27. Mobilizing with PT OT, continue for suspected pneumonia. Overall doing well awaiting for skilled nursing facility placement.

## 2022-01-27 ENCOUNTER — Inpatient Hospital Stay (HOSPITAL_COMMUNITY): Payer: Medicare Other

## 2022-01-27 ENCOUNTER — Encounter (HOSPITAL_COMMUNITY): Payer: Self-pay | Admitting: Family Medicine

## 2022-01-27 ENCOUNTER — Other Ambulatory Visit: Payer: Self-pay

## 2022-01-27 ENCOUNTER — Encounter (HOSPITAL_COMMUNITY)
Admission: EM | Disposition: A | Discharge status: Skilled Nursing Facility | Payer: Self-pay | Source: Home / Self Care | Attending: Internal Medicine

## 2022-01-27 ENCOUNTER — Inpatient Hospital Stay (HOSPITAL_COMMUNITY): Payer: Medicare Other | Admitting: Anesthesiology

## 2022-01-27 DIAGNOSIS — S72002A Fracture of unspecified part of neck of left femur, initial encounter for closed fracture: Secondary | ICD-10-CM | POA: Diagnosis not present

## 2022-01-27 DIAGNOSIS — I10 Essential (primary) hypertension: Secondary | ICD-10-CM

## 2022-01-27 DIAGNOSIS — M85862 Other specified disorders of bone density and structure, left lower leg: Secondary | ICD-10-CM | POA: Diagnosis not present

## 2022-01-27 DIAGNOSIS — S72042A Displaced fracture of base of neck of left femur, initial encounter for closed fracture: Secondary | ICD-10-CM | POA: Diagnosis not present

## 2022-01-27 DIAGNOSIS — F418 Other specified anxiety disorders: Secondary | ICD-10-CM

## 2022-01-27 HISTORY — PX: TOTAL HIP ARTHROPLASTY: SHX124

## 2022-01-27 LAB — CBC
HCT: 36.5 % (ref 36.0–46.0)
Hemoglobin: 12.9 g/dL (ref 12.0–15.0)
MCH: 33.8 pg (ref 26.0–34.0)
MCHC: 35.3 g/dL (ref 30.0–36.0)
MCV: 95.5 fL (ref 80.0–100.0)
Platelets: 296 10*3/uL (ref 150–400)
RBC: 3.82 MIL/uL — ABNORMAL LOW (ref 3.87–5.11)
RDW: 11.8 % (ref 11.5–15.5)
WBC: 14.2 10*3/uL — ABNORMAL HIGH (ref 4.0–10.5)
nRBC: 0 % (ref 0.0–0.2)

## 2022-01-27 LAB — CREATININE, SERUM
Creatinine, Ser: 0.56 mg/dL (ref 0.44–1.00)
GFR, Estimated: >60 mL/min (ref >60)

## 2022-01-27 LAB — SURGICAL PCR SCREEN
MRSA, PCR: NEGATIVE
Staphylococcus aureus: NEGATIVE

## 2022-01-27 SURGERY — ARTHROPLASTY, HIP, TOTAL, ANTERIOR APPROACH
Anesthesia: General | Site: Hip | Laterality: Left

## 2022-01-27 MED ORDER — TRANEXAMIC ACID-NACL 1000-0.7 MG/100ML-% IV SOLN
INTRAVENOUS | Status: AC
  Filled 2022-01-27: qty 100

## 2022-01-27 MED ORDER — BUPIVACAINE-MELOXICAM ER 400-12 MG/14ML IJ SOLN
INTRAMUSCULAR | Status: AC
  Filled 2022-01-27: qty 1

## 2022-01-27 MED ORDER — OXYCODONE HCL 5 MG PO TABS
10.0000 mg | ORAL_TABLET | ORAL | Status: DC | PRN
  Administered 2022-01-28: 10 mg via ORAL
  Administered 2022-01-29 (×2): 15 mg via ORAL
  Administered 2022-01-29: 10 mg via ORAL
  Administered 2022-02-01: 15 mg via ORAL
  Administered 2022-02-01: 10 mg via ORAL
  Administered 2022-02-01: 15 mg via ORAL
  Administered 2022-02-01 (×2): 10 mg via ORAL
  Administered 2022-02-02 (×2): 15 mg via ORAL
  Filled 2022-01-27 (×4): qty 3
  Filled 2022-01-27: qty 2
  Filled 2022-01-27 (×2): qty 3

## 2022-01-27 MED ORDER — IRRISEPT - 450ML BOTTLE WITH 0.05% CHG IN STERILE WATER, USP 99.95% OPTIME
TOPICAL | Status: DC | PRN
  Administered 2022-01-27: 450 mL via TOPICAL

## 2022-01-27 MED ORDER — OXYCODONE HCL 5 MG/5ML PO SOLN: 5.0000 mg | Freq: Once | ORAL | Status: DC | PRN

## 2022-01-27 MED ORDER — POVIDONE-IODINE 10 % EX SWAB
2.0000 | Freq: Once | CUTANEOUS | Status: AC
  Administered 2022-01-27: 2 via TOPICAL

## 2022-01-27 MED ORDER — SODIUM CHLORIDE 0.9 % IR SOLN
Status: DC | PRN
  Administered 2022-01-27: 1000 mL

## 2022-01-27 MED ORDER — HYDROMORPHONE HCL 1 MG/ML IJ SOLN
INTRAMUSCULAR | Status: AC
  Filled 2022-01-27: qty 0.5

## 2022-01-27 MED ORDER — FENTANYL CITRATE (PF) 100 MCG/2ML IJ SOLN
INTRAMUSCULAR | Status: AC
  Administered 2022-01-27: 50 ug via INTRAVENOUS
  Filled 2022-01-27: qty 2

## 2022-01-27 MED ORDER — SORBITOL 70 % SOLN
30.0000 mL | Freq: Every day | Status: DC | PRN
  Administered 2022-01-30: 30 mL via ORAL
  Filled 2022-01-27 (×2): qty 30

## 2022-01-27 MED ORDER — LACTATED RINGERS IV SOLN: INTRAVENOUS | Status: DC

## 2022-01-27 MED ORDER — SODIUM CHLORIDE 0.9 % IV SOLN: INTRAVENOUS | Status: DC

## 2022-01-27 MED ORDER — FENTANYL CITRATE (PF) 250 MCG/5ML IJ SOLN
INTRAMUSCULAR | Status: AC
  Filled 2022-01-27: qty 5

## 2022-01-27 MED ORDER — BUPIVACAINE-MELOXICAM ER 400-12 MG/14ML IJ SOLN
INTRAMUSCULAR | Status: DC | PRN
  Administered 2022-01-27: 412 mg

## 2022-01-27 MED ORDER — OXYCODONE HCL 5 MG PO TABS
5.0000 mg | ORAL_TABLET | ORAL | Status: DC | PRN
  Administered 2022-01-28: 10 mg via ORAL
  Administered 2022-01-30: 5 mg via ORAL
  Administered 2022-01-30 (×2): 10 mg via ORAL
  Administered 2022-01-30 – 2022-01-31 (×2): 5 mg via ORAL
  Administered 2022-01-31 (×2): 10 mg via ORAL
  Filled 2022-01-27 (×2): qty 2
  Filled 2022-01-27: qty 1
  Filled 2022-01-27 (×2): qty 2
  Filled 2022-01-27: qty 1
  Filled 2022-01-27 (×3): qty 2
  Filled 2022-01-27: qty 1
  Filled 2022-01-27 (×2): qty 2

## 2022-01-27 MED ORDER — HYDROMORPHONE HCL 1 MG/ML IJ SOLN
0.5000 mg | INTRAMUSCULAR | Status: DC | PRN
  Administered 2022-01-31: 0.5 mg via INTRAVENOUS
  Administered 2022-01-31 – 2022-02-02 (×6): 1 mg via INTRAVENOUS
  Filled 2022-01-27 (×7): qty 1

## 2022-01-27 MED ORDER — SUGAMMADEX SODIUM 200 MG/2ML IV SOLN
INTRAVENOUS | Status: DC | PRN
  Administered 2022-01-27: 200 mg via INTRAVENOUS

## 2022-01-27 MED ORDER — MIDAZOLAM HCL 2 MG/2ML IJ SOLN
INTRAMUSCULAR | Status: AC
  Filled 2022-01-27: qty 2

## 2022-01-27 MED ORDER — ENOXAPARIN SODIUM 40 MG/0.4ML IJ SOSY
40.0000 mg | PREFILLED_SYRINGE | INTRAMUSCULAR | Status: DC
  Administered 2022-01-28 – 2022-02-02 (×6): 40 mg via SUBCUTANEOUS
  Filled 2022-01-27 (×6): qty 0.4

## 2022-01-27 MED ORDER — ROCURONIUM BROMIDE 10 MG/ML (PF) SYRINGE
PREFILLED_SYRINGE | INTRAVENOUS | Status: DC | PRN
  Administered 2022-01-27: 20 mg via INTRAVENOUS
  Administered 2022-01-27: 40 mg via INTRAVENOUS
  Administered 2022-01-27: 30 mg via INTRAVENOUS

## 2022-01-27 MED ORDER — TRANEXAMIC ACID 1000 MG/10ML IV SOLN
2000.0000 mg | INTRAVENOUS | Status: DC
  Filled 2022-01-27: qty 20

## 2022-01-27 MED ORDER — CHLORHEXIDINE GLUCONATE 0.12 % MT SOLN
OROMUCOSAL | Status: AC
  Administered 2022-01-27: 15 mL via OROMUCOSAL
  Filled 2022-01-27: qty 15

## 2022-01-27 MED ORDER — ORAL CARE MOUTH RINSE: 15.0000 mL | Freq: Once | OROMUCOSAL | Status: AC

## 2022-01-27 MED ORDER — METHOCARBAMOL 500 MG PO TABS
500.0000 mg | ORAL_TABLET | Freq: Four times a day (QID) | ORAL | Status: DC | PRN
  Administered 2022-01-28 – 2022-02-02 (×6): 500 mg via ORAL
  Filled 2022-01-27 (×7): qty 1

## 2022-01-27 MED ORDER — CHLORHEXIDINE GLUCONATE 0.12 % MT SOLN: 15.0000 mL | Freq: Once | OROMUCOSAL | Status: AC

## 2022-01-27 MED ORDER — VANCOMYCIN HCL 1000 MG IV SOLR
INTRAVENOUS | Status: AC
  Filled 2022-01-27: qty 20

## 2022-01-27 MED ORDER — DEXAMETHASONE SODIUM PHOSPHATE 10 MG/ML IJ SOLN
INTRAMUSCULAR | Status: DC | PRN
  Administered 2022-01-27: 5 mg via INTRAVENOUS

## 2022-01-27 MED ORDER — PROPOFOL 10 MG/ML IV BOLUS
INTRAVENOUS | Status: DC | PRN
  Administered 2022-01-27: 150 mg via INTRAVENOUS

## 2022-01-27 MED ORDER — HYDROMORPHONE HCL 1 MG/ML IJ SOLN
INTRAMUSCULAR | Status: DC | PRN
  Administered 2022-01-27: .5 mg via INTRAVENOUS

## 2022-01-27 MED ORDER — FENTANYL CITRATE (PF) 100 MCG/2ML IJ SOLN: 50.0000 ug | Freq: Once | INTRAMUSCULAR | Status: AC

## 2022-01-27 MED ORDER — ONDANSETRON HCL 4 MG/2ML IJ SOLN
INTRAMUSCULAR | Status: DC | PRN
  Administered 2022-01-27: 4 mg via INTRAVENOUS

## 2022-01-27 MED ORDER — MENTHOL 3 MG MT LOZG
1.0000 | LOZENGE | OROMUCOSAL | Status: DC | PRN
  Administered 2022-01-30: 3 mg via ORAL
  Filled 2022-01-27: qty 9

## 2022-01-27 MED ORDER — 0.9 % SODIUM CHLORIDE (POUR BTL) OPTIME
TOPICAL | Status: DC | PRN
  Administered 2022-01-27: 1000 mL

## 2022-01-27 MED ORDER — ACETAMINOPHEN 325 MG PO TABS
325.0000 mg | ORAL_TABLET | Freq: Four times a day (QID) | ORAL | Status: DC | PRN
  Administered 2022-01-29 – 2022-02-02 (×6): 650 mg via ORAL
  Filled 2022-01-27 (×6): qty 2

## 2022-01-27 MED ORDER — ONDANSETRON HCL 4 MG PO TABS: 4.0000 mg | ORAL_TABLET | Freq: Four times a day (QID) | ORAL | Status: DC | PRN

## 2022-01-27 MED ORDER — ONDANSETRON HCL 4 MG/2ML IJ SOLN: 4.0000 mg | Freq: Four times a day (QID) | INTRAMUSCULAR | Status: DC | PRN

## 2022-01-27 MED ORDER — DOCUSATE SODIUM 100 MG PO CAPS
100.0000 mg | ORAL_CAPSULE | Freq: Two times a day (BID) | ORAL | Status: DC
  Administered 2022-01-27 – 2022-02-02 (×11): 100 mg via ORAL
  Filled 2022-01-27 (×12): qty 1

## 2022-01-27 MED ORDER — PROPOFOL 10 MG/ML IV BOLUS
INTRAVENOUS | Status: AC
  Filled 2022-01-27: qty 20

## 2022-01-27 MED ORDER — ALUM & MAG HYDROXIDE-SIMETH 200-200-20 MG/5ML PO SUSP: 30.0000 mL | ORAL | Status: DC | PRN

## 2022-01-27 MED ORDER — TRANEXAMIC ACID-NACL 1000-0.7 MG/100ML-% IV SOLN
1000.0000 mg | INTRAVENOUS | Status: DC
  Filled 2022-01-27: qty 100

## 2022-01-27 MED ORDER — CEFAZOLIN SODIUM-DEXTROSE 2-4 GM/100ML-% IV SOLN
2.0000 g | INTRAVENOUS | Status: DC
  Filled 2022-01-27: qty 100

## 2022-01-27 MED ORDER — VANCOMYCIN HCL 1 G IV SOLR
INTRAVENOUS | Status: DC | PRN
  Administered 2022-01-27: 1000 mg

## 2022-01-27 MED ORDER — AMISULPRIDE (ANTIEMETIC) 5 MG/2ML IV SOLN: 10.0000 mg | Freq: Once | INTRAVENOUS | Status: DC | PRN

## 2022-01-27 MED ORDER — TRANEXAMIC ACID 1000 MG/10ML IV SOLN
INTRAVENOUS | Status: DC | PRN
  Administered 2022-01-27: 1000 mg via TOPICAL

## 2022-01-27 MED ORDER — FENTANYL CITRATE (PF) 250 MCG/5ML IJ SOLN
INTRAMUSCULAR | Status: DC | PRN
  Administered 2022-01-27 (×3): 25 ug via INTRAVENOUS
  Administered 2022-01-27: 100 ug via INTRAVENOUS
  Administered 2022-01-27: 50 ug via INTRAVENOUS
  Administered 2022-01-27: 25 ug via INTRAVENOUS

## 2022-01-27 MED ORDER — OXYCODONE HCL 5 MG PO TABS: 5.0000 mg | ORAL_TABLET | Freq: Once | ORAL | Status: DC | PRN

## 2022-01-27 MED ORDER — MAGNESIUM CITRATE PO SOLN: 1.0000 | Freq: Once | ORAL | Status: DC | PRN

## 2022-01-27 MED ORDER — PHENYLEPHRINE 80 MCG/ML (10ML) SYRINGE FOR IV PUSH (FOR BLOOD PRESSURE SUPPORT)
PREFILLED_SYRINGE | INTRAVENOUS | Status: DC | PRN
  Administered 2022-01-27: 80 ug via INTRAVENOUS

## 2022-01-27 MED ORDER — PHENOL 1.4 % MT LIQD: 1.0000 | OROMUCOSAL | Status: DC | PRN

## 2022-01-27 MED ORDER — TRANEXAMIC ACID-NACL 1000-0.7 MG/100ML-% IV SOLN
1000.0000 mg | Freq: Once | INTRAVENOUS | Status: AC
  Administered 2022-01-27: 1000 mg via INTRAVENOUS
  Filled 2022-01-27: qty 100

## 2022-01-27 MED ORDER — HYDROMORPHONE HCL 1 MG/ML IJ SOLN: 0.2500 mg | INTRAMUSCULAR | Status: DC | PRN

## 2022-01-27 MED ORDER — CEFAZOLIN SODIUM-DEXTROSE 2-4 GM/100ML-% IV SOLN
2.0000 g | Freq: Four times a day (QID) | INTRAVENOUS | Status: AC
  Administered 2022-01-27 – 2022-01-28 (×3): 2 g via INTRAVENOUS
  Filled 2022-01-27 (×3): qty 100

## 2022-01-27 MED ORDER — HYDROMORPHONE HCL 1 MG/ML IJ SOLN
INTRAMUSCULAR | Status: AC
  Administered 2022-01-27: 0.5 mg via INTRAVENOUS
  Filled 2022-01-27: qty 1

## 2022-01-27 MED ORDER — POLYETHYLENE GLYCOL 3350 17 G PO PACK
17.0000 g | PACK | Freq: Every day | ORAL | Status: DC | PRN
  Administered 2022-01-29: 17 g via ORAL
  Filled 2022-01-27: qty 1

## 2022-01-27 MED ORDER — LIDOCAINE 2% (20 MG/ML) 5 ML SYRINGE
INTRAMUSCULAR | Status: DC | PRN
  Administered 2022-01-27: 100 mg via INTRAVENOUS

## 2022-01-27 MED ORDER — ACETAMINOPHEN 500 MG PO TABS
1000.0000 mg | ORAL_TABLET | Freq: Four times a day (QID) | ORAL | Status: AC
  Administered 2022-01-27 – 2022-01-28 (×4): 1000 mg via ORAL
  Filled 2022-01-27 (×4): qty 2

## 2022-01-27 MED ORDER — METHOCARBAMOL 1000 MG/10ML IJ SOLN: 500.0000 mg | Freq: Four times a day (QID) | INTRAVENOUS | Status: DC | PRN

## 2022-01-27 SURGICAL SUPPLY — 71 items
ADH SKN CLS APL DERMABOND .7 (GAUZE/BANDAGES/DRESSINGS) ×1
BAG COUNTER SPONGE SURGICOUNT (BAG) ×1 IMPLANT
BAG DECANTER FOR FLEXI CONT (MISCELLANEOUS) ×1 IMPLANT
BAG SPNG CNTER NS LX DISP (BAG) ×1
BALL HIP CERAMIC (Hips) IMPLANT
CELLS DAT CNTRL 66122 CELL SVR (MISCELLANEOUS) IMPLANT
COVER PERINEAL POST (MISCELLANEOUS) ×1 IMPLANT
COVER SURGICAL LIGHT HANDLE (MISCELLANEOUS) ×1 IMPLANT
CUP ACET PINNACLE SECTR 48MM (Joint) IMPLANT
DERMABOND ADVANCED .7 DNX12 (GAUZE/BANDAGES/DRESSINGS) IMPLANT
DRAPE C-ARM 42X72 X-RAY (DRAPES) ×1 IMPLANT
DRAPE POUCH INSTRU U-SHP 10X18 (DRAPES) ×1 IMPLANT
DRAPE STERI IOBAN 125X83 (DRAPES) ×1 IMPLANT
DRAPE U-SHAPE 47X51 STRL (DRAPES) ×2 IMPLANT
DRSG AQUACEL AG ADV 3.5X10 (GAUZE/BANDAGES/DRESSINGS) ×1 IMPLANT
DURAPREP 26ML APPLICATOR (WOUND CARE) ×2 IMPLANT
ELECT BLADE 4.0 EZ CLEAN MEGAD (MISCELLANEOUS) ×1
ELECT PENCIL ROCKER SW 15FT (MISCELLANEOUS) IMPLANT
ELECT REM PT RETURN 9FT ADLT (ELECTROSURGICAL) ×1
ELECTRODE BLDE 4.0 EZ CLN MEGD (MISCELLANEOUS) ×1 IMPLANT
ELECTRODE REM PT RTRN 9FT ADLT (ELECTROSURGICAL) ×1 IMPLANT
GLOVE BIOGEL PI IND STRL 7.0 (GLOVE) ×2 IMPLANT
GLOVE BIOGEL PI IND STRL 7.5 (GLOVE) ×5 IMPLANT
GLOVE ECLIPSE 7.0 STRL STRAW (GLOVE) ×2 IMPLANT
GLOVE SKINSENSE STRL SZ7.5 (GLOVE) ×1 IMPLANT
GLOVE SURG SYN 7.5  E (GLOVE) ×2
GLOVE SURG SYN 7.5 E (GLOVE) ×2 IMPLANT
GLOVE SURG SYN 7.5 PF PI (GLOVE) ×2 IMPLANT
GLOVE SURG UNDER POLY LF SZ7 (GLOVE) ×1 IMPLANT
GLOVE SURG UNDER POLY LF SZ7.5 (GLOVE) ×2 IMPLANT
GOWN STRL REIN XL XLG (GOWN DISPOSABLE) ×1 IMPLANT
GOWN STRL REUS W/ TWL LRG LVL3 (GOWN DISPOSABLE) IMPLANT
GOWN STRL REUS W/ TWL XL LVL3 (GOWN DISPOSABLE) ×1 IMPLANT
GOWN STRL REUS W/TWL LRG LVL3 (GOWN DISPOSABLE)
GOWN STRL REUS W/TWL XL LVL3 (GOWN DISPOSABLE) ×1
HANDPIECE INTERPULSE COAX TIP (DISPOSABLE) ×1
HIP BALL CERAMIC (Hips) ×1 IMPLANT
HOOD PEEL AWAY FLYTE STAYCOOL (MISCELLANEOUS) ×2 IMPLANT
IV NS IRRIG 3000ML ARTHROMATIC (IV SOLUTION) ×1 IMPLANT
JET LAVAGE IRRISEPT WOUND (IRRIGATION / IRRIGATOR) ×1
KIT BASIN OR (CUSTOM PROCEDURE TRAY) ×1 IMPLANT
LAVAGE JET IRRISEPT WOUND (IRRIGATION / IRRIGATOR) ×1 IMPLANT
LINER ACET 32X48 (Liner) IMPLANT
MARKER SKIN DUAL TIP RULER LAB (MISCELLANEOUS) ×1 IMPLANT
NDL SPNL 18GX3.5 QUINCKE PK (NEEDLE) ×1 IMPLANT
NEEDLE SPNL 18GX3.5 QUINCKE PK (NEEDLE) ×1 IMPLANT
PACK TOTAL JOINT (CUSTOM PROCEDURE TRAY) ×1 IMPLANT
PACK UNIVERSAL I (CUSTOM PROCEDURE TRAY) ×1 IMPLANT
PINNSECTOR W/GRIP ACE CUP 48MM (Joint) ×1 IMPLANT
RETRACTOR WND ALEXIS 18 MED (MISCELLANEOUS) IMPLANT
RTRCTR WOUND ALEXIS 18CM MED (MISCELLANEOUS)
SAW OSC TIP CART 19.5X105X1.3 (SAW) ×1 IMPLANT
SCREW 6.5MMX30MM (Screw) IMPLANT
SET HNDPC FAN SPRY TIP SCT (DISPOSABLE) ×1 IMPLANT
STAPLER VISISTAT 35W (STAPLE) IMPLANT
STEM FEM ACTIS STD SZ2 (Stem) IMPLANT
SUT ETHIBOND 2 V 37 (SUTURE) ×1 IMPLANT
SUT ETHILON 2 0 PSLX (SUTURE) IMPLANT
SUT VIC AB 0 CT1 27 (SUTURE) ×1
SUT VIC AB 0 CT1 27XBRD ANBCTR (SUTURE) ×1 IMPLANT
SUT VIC AB 1 CTX 36 (SUTURE) ×1
SUT VIC AB 1 CTX36XBRD ANBCTR (SUTURE) ×1 IMPLANT
SUT VIC AB 2-0 CT1 27 (SUTURE) ×2
SUT VIC AB 2-0 CT1 TAPERPNT 27 (SUTURE) ×2 IMPLANT
SYR 50ML LL SCALE MARK (SYRINGE) ×1 IMPLANT
TOWEL GREEN STERILE (TOWEL DISPOSABLE) ×1 IMPLANT
TRAY CATH 16FR W/PLASTIC CATH (SET/KITS/TRAYS/PACK) IMPLANT
TRAY FOLEY W/BAG SLVR 16FR (SET/KITS/TRAYS/PACK) ×1
TRAY FOLEY W/BAG SLVR 16FR ST (SET/KITS/TRAYS/PACK) ×1 IMPLANT
TUBE SUCT ARGYLE STRL (TUBING) ×1 IMPLANT
YANKAUER SUCT BULB TIP NO VENT (SUCTIONS) ×1 IMPLANT

## 2022-01-27 NOTE — Anesthesia Procedure Notes (Signed)
Procedure Name: Intubation Date/Time: 01/27/2022 1:56 PM  Performed by: Dorthea Cove, CRNAPre-anesthesia Checklist: Patient identified, Emergency Drugs available, Suction available and Patient being monitored Patient Re-evaluated:Patient Re-evaluated prior to induction Oxygen Delivery Method: Circle system utilized Preoxygenation: Pre-oxygenation with 100% oxygen Induction Type: IV induction Ventilation: Mask ventilation without difficulty Laryngoscope Size: Mac and 3 Grade View: Grade I Tube type: Oral Tube size: 7.0 mm Number of attempts: 1 Airway Equipment and Method: Stylet and Oral airway Placement Confirmation: ETT inserted through vocal cords under direct vision, positive ETCO2 and breath sounds checked- equal and bilateral Secured at: 21 cm Tube secured with: Tape Dental Injury: Teeth and Oropharynx as per pre-operative assessment

## 2022-01-27 NOTE — Discharge Instructions (Signed)

## 2022-01-27 NOTE — Progress Notes (Signed)
PROGRESS NOTE    Sandra Riley   SKA:[id number] DOB: [birth year]  DOA: 01/26/2022 Date of Service: 01/27/22 PCP: Fayrene Helper, MD     Brief Narrative / Hospital Course:  Sandra Riley is a 61 y.o. female with medical history significant of anxiety and depression, epilepsy, hypertension, and more presents ED with a chief complaint of leg pain.  Patient reports that she got up in the night to go to the bathroom when she had a slip and fall in the hallway. No head trauma or LOC. Patient does have a seizure disorder. She cannot remember when the last time she had a seizure was. She reports she usually does feel an aura prior to a seizure.  12/26: XR in ED (+)Acute minimally displaced left femoral neck fracture ortho recommends [facility] to [facility] for surgery 12/27     Consultants:  orthopedics  Procedures: fixation L hip fracture 12/27      ASSESSMENT & PLAN:   Principal Problem:   Hip fracture (HCC) Active Problems:   Moderate mood disorder (HCC)   Essential hypertension   Generalized convulsive epilepsy ([place])  Acute minimally displaced left femoral neck fracture Ortho consulted, admission at [facility] from [facility], plan THA w/ Dr Erlinda Hong  Pain control with pain scale  Moderate mood disorder ([place]) Continue Remeron, Klonopin as needed  Essential hypertension Continue Norvasc and Aldactone  Generalized convulsive epilepsy ([place]) Continue Keppra and luminal  Mild hyponatremia -trend  DVT prophylaxis: per ortho   Code Status: FULL CODE   Disposition: inpatient  TOC needs: will likely need STR/HH Barriers to discharge / significant pending items: surgery 12/27             Subjective:  Some pain in hip but denies SOB, CP     Objective Findings:  Vitals:   01/26/22 2010 01/27/22 0615 01/27/22 0750 01/27/22 0857  BP: (!) 152/82 (!) 158/85 (!) 161/80 (!) 176/84  Pulse: 95 100 98   Resp: '16 16 16   '$ Temp: 98.7 F  (37.1 C) 98.7 F (37.1 C) 98.3 F (36.8 C)   TempSrc:  Oral    SpO2: 98% 95% 98%   Weight:      Height:        Intake/Output Summary (Last 24 hours) at 01/27/2022 1017 Last data filed at 01/26/2022 2340 Gross per 24 hour  Intake 240 ml  Output 2000 ml  Net -1760 ml   Filed Weights   01/26/22 0201  Weight: 63 kg    Examination:   General: Appearance:     Overweight female in no acute distress     Lungs:     respirations unlabored  Heart:    Normal heart rate.    MS:   All extremities are intact.   Neurologic:   Awake, alert       Scheduled Medications:   amLODipine  5 mg Oral Daily   heparin  5,000 Units Subcutaneous Q8H   levETIRAcetam  1,000 mg Oral BID   mirtazapine  45 mg Oral QHS   PHENobarbital  97.2 mg Oral 2 times per day on Mon Tue Wed Thu Fri   [START ON 01/30/2022] phenobarbital  97.2 mg Oral Once per day on Sun Sat   povidone-iodine  2 Application Topical Once   povidone-iodine  2 Application Topical Once   spironolactone  50 mg Oral Daily   tranexamic acid (CYKLOKAPRON) 2,000 mg in sodium chloride 0.9 % 50 mL Topical Application  2,035 mg  Topical To OR    Continuous Infusions:   ceFAZolin (ANCEF) IV      ceFAZolin (ANCEF) IV     tranexamic acid     tranexamic acid      PRN Medications:  acetaminophen **OR** acetaminophen, clonazePAM, morphine injection, ondansetron **OR** ondansetron (ZOFRAN) IV, mouth rinse, oxyCODONE  Antimicrobials:  Anti-infectives (From admission, onward)    Start     Dose/Rate Route Frequency Ordered Stop   01/27/22 1200  ceFAZolin (ANCEF) IVPB 2g/100 mL premix        2 g 200 mL/hr over 30 Minutes Intravenous To Surgery 01/26/22 1558 01/28/22 1200   01/27/22 0730  ceFAZolin (ANCEF) IVPB 2g/100 mL premix        2 g 200 mL/hr over 30 Minutes Intravenous On call to O.R. 01/27/22 0631 01/28/22 0559           Data Reviewed: I have personally reviewed following labs and imaging studies  CBC: Recent Labs  Lab  01/26/22 0322  WBC 8.5  NEUTROABS 5.2  HGB 12.1  HCT 36.8  MCV 99.5  PLT 945   Basic Metabolic Panel: Recent Labs  Lab 01/26/22 0322  NA 133*  K 3.7  CL 98  CO2 28  GLUCOSE 129*  BUN 6*  CREATININE 0.53  CALCIUM 9.4   GFR: Estimated Creatinine Clearance: 62.8 mL/min (by C-G formula based on SCr of 0.53 mg/dL). Liver Function Tests: No results for input(s): "AST", "ALT", "ALKPHOS", "BILITOT", "PROT", "ALBUMIN" in the last 168 hours. No results for input(s): "LIPASE", "AMYLASE" in the last 168 hours. No results for input(s): "AMMONIA" in the last 168 hours. Coagulation Profile: Recent Labs  Lab 01/26/22 0322  INR 0.9   Cardiac Enzymes: No results for input(s): "CKTOTAL", "CKMB", "CKMBINDEX", "TROPONINI" in the last 168 hours. BNP (last 3 results) No results for input(s): "PROBNP" in the last 8760 hours. HbA1C: No results for input(s): "HGBA1C" in the last 72 hours. CBG: No results for input(s): "GLUCAP" in the last 168 hours. Lipid Profile: No results for input(s): "CHOL", "HDL", "LDLCALC", "TRIG", "CHOLHDL", "LDLDIRECT" in the last 72 hours. Thyroid Function Tests: No results for input(s): "TSH", "T4TOTAL", "FREET4", "T3FREE", "THYROIDAB" in the last 72 hours. Anemia Panel: No results for input(s): "VITAMINB12", "FOLATE", "FERRITIN", "TIBC", "IRON", "RETICCTPCT" in the last 72 hours. Most Recent Urinalysis On File:     Component Value Date/Time   COLORURINE YELLOW 05/08/2008 0309   APPEARANCEUR CLEAR 05/08/2008 0309   LABSPEC 1.014 05/08/2008 0309   PHURINE 6.0 05/08/2008 0309   GLUCOSEU NEG mg/dL 05/08/2008 0309   BILIRUBINUR NEG 05/08/2008 0309   KETONESUR NEG mg/dL 05/08/2008 0309   PROTEINUR NEG mg/dL 05/08/2008 0309   UROBILINOGEN 1 05/08/2008 0309   NITRITE NEG 05/08/2008 0309   LEUKOCYTESUR NEG 05/08/2008 0309   Sepsis Labs: '@LABRCNTIP'$ (procalcitonin:4,lacticidven:4)  Recent Results (from the past 240 hour(s))  Surgical pcr screen     Status:  None   Collection Time: 01/27/22  6:36 AM   Specimen: Nasal Mucosa; Nasal Swab  Result Value Ref Range Status   MRSA, PCR NEGATIVE NEGATIVE Final   Staphylococcus aureus NEGATIVE NEGATIVE Final    Comment: (NOTE) The Xpert SA Assay (FDA approved for NASAL specimens in patients 23 years of age and older), is one component of a comprehensive surveillance program. It is not intended to diagnose infection nor to guide or monitor treatment. Performed at [facility], [place] [address], [place], [place] [address]  Radiology Studies: DG Chest 1 View  Result Date: 01/26/2022 CLINICAL DATA:  Preop EXAM: CHEST  1 VIEW COMPARISON:  None Available. FINDINGS: Heart size upper limits of normal. No focal consolidation, pleural effusion, or pneumothorax. No acute osseous abnormality. IMPRESSION: No active disease. Electronically Signed   By: Placido Sou M.D.   On: 01/26/2022 03:10   DG Hip Unilat W or Wo Pelvis 2-3 Views Left  Result Date: 01/26/2022 CLINICAL DATA:  Left hip pain after fall EXAM: DG HIP (WITH OR WITHOUT PELVIS) 2-3V LEFT COMPARISON:  None Available. FINDINGS: Irregularity about the left femoral neck with foreshortening compatible acute femoral neck fracture. Body habitus limits assessment on lateral view. IMPRESSION: Acute minimally displaced left femoral neck fracture. Electronically Signed   By: Placido Sou M.D.   On: 01/26/2022 03:02            LOS: 1 day       Geradine Girt, DO [facility] 01/27/2022, 10:17 AM    Staff may message me via secure chat in [place]  but this may not receive an immediate response,  please page me for urgent matters!  If 7PM-7AM, please contact night coverage [email or web address]

## 2022-01-27 NOTE — Op Note (Signed)
TOTAL HIP ARTHROPLASTY ANTERIOR APPROACH  Procedure Note Sandra Riley   [id number]  Pre-op Diagnosis: left femoral neck fracture     Post-op Diagnosis: same  Operative Findings Acute transcervical femoral neck fracture Osteopenic bone   Operative Procedures  1. Total hip replacement; Left hip; uncemented [id number]   Surgeon: Frankey Shown, M.D.  Assist: Madalyn Rob, PA-C   Anesthesia: general  Prosthesis: Depuy Acetabulum: Pinnacle 48 mm Femur: Actis 2 STD Head: 32 mm size: +5 Liner: +0 Bearing Type: ceramic/poly  Total Hip Arthroplasty (Anterior Approach) Op Note:  After informed consent was obtained and the operative extremity marked in the holding area, the patient was brought back to the operating room and placed supine on the HANA table. Next, the operative extremity was prepped and draped in normal sterile fashion. Surgical timeout occurred verifying patient identification, surgical site, surgical procedure and administration of antibiotics.  A Hueter approach to the hip was performed, using the interval between tensor fascia lata and sartorius.  Dissection was carried bluntly down onto the anterior hip capsule. The lateral femoral circumflex vessels were identified and coagulated. A capsulotomy was performed and the capsular flaps tagged for later repair.  Fracture hematoma was encountered.  The neck osteotomy was performed below the fracture. The femoral head was removed which showed moderate degenerative wear, the acetabular rim was cleared of soft tissue and osteophytes and attention was turned to reaming the acetabulum.  Sequential reaming was performed under fluoroscopic guidance down to the floor of the cotyloid fossa. We reamed to a size 47 mm, and then impacted the acetabular shell. A 30 mm cancellous screw was placed through the shell for added fixation.  The liner was then placed after irrigation and attention turned to the femur.  After placing the  femoral hook, the leg was taken to externally rotated, extended and adducted position taking care to perform soft tissue releases to allow for adequate mobilization of the femur. Soft tissue was cleared from the shoulder of the greater trochanter and the hook elevator used to improve exposure of the proximal femur. Sequential broaching performed up to a size 2. Trial neck and head were placed. The leg was brought back up to neutral and the construct reduced.  Antibiotic irrigation was placed in the surgical wound.  The position and sizing of components, offset and leg lengths were checked using fluoroscopy. Stability of the construct was checked in extension and external rotation without any subluxation, shuck or impingement of prosthesis. We dislocated the prosthesis, dropped the leg back into position, removed trial components, and irrigated copiously. The final stem and head was then placed, the leg brought back up, the system reduced and fluoroscopy used to verify positioning.  We irrigated, obtained hemostasis and closed the capsule using #2 ethibond suture.  One gram of vancomycin powder was placed in the surgical bed.   One gram of topical tranexamic acid was injected into the joint.  The fascia was closed with #1 vicryl plus, the deep fat layer was closed with 0 vicryl, the subcutaneous layers closed with 2.0 Vicryl Plus and the skin closed with 2.0 nylon and dermabond. A sterile dressing was applied. The patient was awakened in the operating room and taken to recovery in stable condition.  All sponge, needle, and instrument counts were correct at the end of the case.   [name], my PA, was a medical necessity for opening, closing, limb positioning, retracting, exposing, and overall facilitation and timely completion of the surgery.  Position: supine  Complications: see description of procedure.  Time Out: performed   Drains/Packing: none  Estimated blood loss: see anesthesia  record  Returned to Recovery Room: in good condition.   Antibiotics: yes   Mechanical VTE (DVT) Prophylaxis: sequential compression devices, TED thigh-high  Chemical VTE (DVT) Prophylaxis: lovenox POD 1   Fluid Replacement: see anesthesia record  Specimens Removed: 1 to pathology   Sponge and Instrument Count Correct? yes   PACU: portable radiograph - low AP   Plan/RTC: Return in 2 weeks for staple removal. Weight Bearing/Load Lower Extremity: full  Hip precautions: none Suture Removal: 2 weeks   N. Eduard Roux, MD [name] 3:08 PM   Implant Name Type Inv. Item Serial No. Manufacturer Lot No. LRB No. Used Action  PINNSECTOR W/GRIP ACE CUP 48MM - [id number] Joint PINNSECTOR W/GRIP ACE CUP 48MM  [employer] [id number] Left 1 Implanted  LINER ACET 32X48 - [id number] Liner LINER ACET 32X48  [employer] [id number] Left 1 Implanted  SCREW 6.5MMX30MM - [id number] Screw SCREW 6.5MMX30MM  [employer] [id number] Left 1 Implanted  HIP BALL CERAMIC - [id number] Hips HIP BALL CERAMIC  [employer] [id number] Left 1 Implanted  STEM FEM ACTIS STD SZ2 - [id number] Stem STEM FEM ACTIS STD SZ2  [employer] [id number] Left 1 Implanted

## 2022-01-27 NOTE — Anesthesia Postprocedure Evaluation (Signed)
Anesthesia Post Note  Patient: Sandra Riley  Procedure(s) Performed: TOTAL HIP ARTHROPLASTY ANTERIOR APPROACH (Left: Hip)     Patient location during evaluation: PACU Anesthesia Type: General Level of consciousness: awake and alert Pain management: pain level controlled Vital Signs Assessment: post-procedure vital signs reviewed and stable Respiratory status: spontaneous breathing, nonlabored ventilation, respiratory function stable and patient connected to nasal cannula oxygen Cardiovascular status: blood pressure returned to baseline and stable Postop Assessment: no apparent nausea or vomiting Anesthetic complications: no   No notable events documented.  Last Vitals:  Vitals:   01/27/22 1630 01/27/22 1645  BP: (!) 161/85   Pulse: 93 95  Resp: 15 16  Temp:  37.4 C  SpO2: 98% 98%    Last Pain:  Vitals:   01/27/22 1630  TempSrc:   PainSc: [name]

## 2022-01-27 NOTE — Plan of Care (Signed)
  Problem: Education: Goal: Knowledge of General Education information will improve Description: Including pain rating scale, medication(s)/side effects and non-pharmacologic comfort measures Outcome: Progressing   Problem: Health Behavior/Discharge Planning: Goal: Ability to manage health-related needs will improve Outcome: Progressing   Problem: Clinical Measurements: Goal: Ability to maintain clinical measurements within normal limits will improve Outcome: Progressing Goal: Will remain free from infection Outcome: Progressing   Problem: Activity: Goal: Risk for activity intolerance will decrease Outcome: Progressing   Problem: Coping: Goal: Level of anxiety will decrease Outcome: Progressing   

## 2022-01-27 NOTE — Transfer of Care (Signed)
Immediate Anesthesia Transfer of Care Note  Patient: Sandra Riley  Procedure(s) Performed: TOTAL HIP ARTHROPLASTY ANTERIOR APPROACH (Left: Hip)  Patient Location: PACU  Anesthesia Type:General  Level of Consciousness: awake and patient cooperative  Airway & Oxygen Therapy: Patient Spontanous Breathing and Patient connected to face mask oxygen  Post-op Assessment: Report given to RN, Post -op Vital signs reviewed and stable, and Patient moving all extremities  Post vital signs: Reviewed and stable  Last Vitals:  Vitals Value Taken Time  BP 162/82 01/27/22 1545  Temp    Pulse 100 01/27/22 1545  Resp 20 01/27/22 1545  SpO2 93 % 01/27/22 1545  Vitals shown include unvalidated device data.  Last Pain:  Vitals:   01/27/22 1100  TempSrc:   PainSc: 4       Patients Stated Pain Goal: 3 (08/21/80 8833)  Complications: No notable events documented.

## 2022-01-27 NOTE — Anesthesia Preprocedure Evaluation (Addendum)
Anesthesia Evaluation  Patient identified by MRN, date of birth, ID band Patient awake    Reviewed: Allergy & Precautions, NPO status , Patient's Chart, lab work & pertinent test results  History of Anesthesia Complications Negative for: history of anesthetic complications  Airway Mallampati: III  TM Distance: >3 FB Neck ROM: Full    Dental  (+) Edentulous Upper, Edentulous Lower   Pulmonary neg pulmonary ROS   breath sounds clear to auscultation       Cardiovascular hypertension, Pt. on medications (-) angina (-) Past MI and (-) Cardiac Stents  Rhythm:Regular Rate:Normal     Neuro/Psych Seizures - (on Keppra and phenobarbital),  PSYCHIATRIC DISORDERS Anxiety Depression       GI/Hepatic negative GI ROS, Neg liver ROS,,,  Endo/Other  negative endocrine ROS    Renal/GU negative Renal ROS     Musculoskeletal osteoporosis   Abdominal  (+) - obese  Peds  Hematology negative hematology ROS (+)   Anesthesia Other Findings   Reproductive/Obstetrics                             Anesthesia Physical Anesthesia Plan  ASA: 3  Anesthesia Plan:    Post-op Pain Management:    Induction:   PONV Risk Score and Plan: 2 and Ondansetron, Dexamethasone, Propofol infusion and Treatment may vary due to age or medical condition  Airway Management Planned: Oral ETT  Additional Equipment:   Intra-op Plan:   Post-operative Plan: Extubation in OR  Informed Consent: I have reviewed the patients History and Physical, chart, labs and discussed the procedure including the risks, benefits and alternatives for the proposed anesthesia with the patient or authorized representative who has indicated his/her understanding and acceptance.     Dental advisory given  Plan Discussed with: CRNA and Anesthesiologist  Anesthesia Plan Comments: (Discussed neuraxial anesthesia and general anesthesia. The patient  prefers general anesthesia. Risks of general anesthesia discussed including, but not limited to, sore throat, hoarse voice, chipped/damaged teeth, injury to vocal cords, nausea and vomiting, allergic reactions, lung infection, heart attack, stroke, and death. All questions answered. )       Anesthesia Quick Evaluation

## 2022-01-27 NOTE — H&P (Signed)

## 2022-01-28 ENCOUNTER — Encounter (HOSPITAL_COMMUNITY): Payer: Self-pay | Admitting: Orthopaedic Surgery

## 2022-01-28 DIAGNOSIS — S72042A Displaced fracture of base of neck of left femur, initial encounter for closed fracture: Secondary | ICD-10-CM | POA: Diagnosis not present

## 2022-01-28 LAB — CBC
HCT: 33.6 % — ABNORMAL LOW (ref 36.0–46.0)
Hemoglobin: 11.7 g/dL — ABNORMAL LOW (ref 12.0–15.0)
MCH: 33.8 pg (ref 26.0–34.0)
MCHC: 34.8 g/dL (ref 30.0–36.0)
MCV: 97.1 fL (ref 80.0–100.0)
Platelets: 168 10*3/uL (ref 150–400)
RBC: 3.46 MIL/uL — ABNORMAL LOW (ref 3.87–5.11)
RDW: 11.9 % (ref 11.5–15.5)
WBC: 11.4 10*3/uL — ABNORMAL HIGH (ref 4.0–10.5)
nRBC: 0 % (ref 0.0–0.2)

## 2022-01-28 LAB — BASIC METABOLIC PANEL
Anion gap: 13 (ref 5–15)
BUN: 6 mg/dL — ABNORMAL LOW (ref 8–23)
CO2: 26 mmol/L (ref 22–32)
Calcium: 8.9 mg/dL (ref 8.9–10.3)
Chloride: 97 mmol/L — ABNORMAL LOW (ref 98–111)
Creatinine, Ser: 0.58 mg/dL (ref 0.44–1.00)
GFR, Estimated: >60 mL/min (ref >60)
Glucose, Bld: 112 mg/dL — ABNORMAL HIGH (ref 70–99)
Potassium: 4.3 mmol/L (ref 3.5–5.1)
Sodium: 136 mmol/L (ref 135–145)

## 2022-01-28 LAB — URINALYSIS, ROUTINE W REFLEX MICROSCOPIC
Bilirubin Urine: NEGATIVE
Glucose, UA: NEGATIVE mg/dL
Ketones, ur: NEGATIVE mg/dL
Nitrite: NEGATIVE
Protein, ur: 30 mg/dL — AB
Specific Gravity, Urine: 1.006 (ref 1.005–1.030)
pH: 5 (ref 5.0–8.0)

## 2022-01-28 MED ORDER — ENOXAPARIN SODIUM 40 MG/0.4ML IJ SOSY: 40.0000 mg | PREFILLED_SYRINGE | INTRAMUSCULAR | 0 refills | Status: DC

## 2022-01-28 MED ORDER — OXYCODONE HCL 5 MG PO TABS: 5.0000 mg | ORAL_TABLET | Freq: Four times a day (QID) | ORAL | 0 refills | Status: DC | PRN

## 2022-01-28 NOTE — Progress Notes (Signed)
RE:  Sandra Riley       Date of Birth:  [birth year]     Date:   01/28/22       To Whom It May Concern:  Please be advised that the above-named patient will require a short-term nursing home stay - anticipated 30 days or less for rehabilitation and strengthening.  The plan is for return home.                 MD signature                Date

## 2022-01-28 NOTE — NC FL2 (Signed)
[place] LEVEL OF CARE FORM     IDENTIFICATION  Patient Name: Sandra Riley Birthdate: 1960-06-02 Sex: female Admission Date (Current Location): 01/26/2022  [place] and [place] Number:  Kathleen Argue [id number] [place] and Address:  [facility], [place] [address] [place], [place] [address]      Provider Number: [id number]  Attending Physician Name and Address:  Geradine Girt, DO  Relative Name and Phone Number:  Sebastian, Dzik [phone]    Current Level of Care: Hospital Recommended Level of Care: [place] Prior Approval Number:    Date Approved/Denied:   PASRR Number:    Discharge Plan: SNF    Current Diagnoses: Patient Active Problem List   Diagnosis Date Noted   Displaced fracture of base of neck of left femur, initial encounter for closed fracture ([place]) 01/26/2022   Encounter for annual physical exam 12/09/2021   Depression, major, single episode, severe ([place]) 04/12/2021   Grief at loss of child 04/10/2021   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   Essential hypertension 12/02/2019   Osteoporosis without current pathological fracture 04/27/2019   Vitamin D deficiency 12/20/2010   ALLERGIC RHINITIS, SEASONAL 09/04/2007   Moderate mood disorder ([place]) 02/21/2007   Mixed insomnia 02/21/2007    Orientation RESPIRATION BLADDER Height & Weight     Self, Time, Situation, Place  O2 Continent Weight: 134 lb (60.8 kg) Height:  '5\' 2"'$  (157.5 cm)  BEHAVIORAL SYMPTOMS/MOOD NEUROLOGICAL BOWEL NUTRITION STATUS    Convulsions/Seizures Continent Diet (see discharge summary)  AMBULATORY STATUS COMMUNICATION OF NEEDS Skin   Limited Assist Verbally Surgical wounds                       Personal Care Assistance Level of Assistance  Bathing, Feeding, Dressing Bathing Assistance: Maximum assistance Feeding assistance: Independent Dressing Assistance:  Maximum assistance     Functional Limitations Info  Sight, Hearing, Speech Sight Info: Adequate Hearing Info: Adequate Speech Info: Adequate    SPECIAL CARE FACTORS FREQUENCY  PT (By licensed PT), OT (By licensed OT)     PT Frequency: 5x week OT Frequency: 5x week            Contractures Contractures Info: Not present    Additional Factors Info  Code Status, Allergies Code Status Info: full Allergies Info: Aspirin, Nsaids, Singulair (Montelukast Sodium)           Current Medications (01/28/2022):  This is the current hospital active medication list Current Facility-Administered Medications  Medication Dose Route Frequency Provider Last Rate Last Admin   0.9 %  sodium chloride infusion   Intravenous Continuous Leandrew Koyanagi, MD   Stopped at 01/28/22 0657   acetaminophen (TYLENOL) tablet 325-650 mg  325-650 mg Oral Q6H PRN Leandrew Koyanagi, MD       alum & mag hydroxide-simeth (MAALOX/MYLANTA) 200-200-20 MG/5ML suspension 30 mL  30 mL Oral Q4H PRN Leandrew Koyanagi, MD       amLODipine (NORVASC) tablet 5 mg  5 mg Oral Daily Leandrew Koyanagi, MD   5 mg at 01/28/22 0851   clonazePAM (KLONOPIN) tablet 1 mg  1 mg Oral BID PRN Leandrew Koyanagi, MD       docusate sodium (COLACE) capsule 100 mg  100 mg Oral BID Leandrew Koyanagi, MD   100 mg at 01/28/22 0851   enoxaparin (LOVENOX) injection 40 mg  40 mg Subcutaneous Q24H Leandrew Koyanagi, MD  40 mg at 01/28/22 0850   HYDROmorphone (DILAUDID) injection 0.5-1 mg  0.5-1 mg Intravenous Q4H PRN Leandrew Koyanagi, MD       levETIRAcetam (KEPPRA) tablet 1,000 mg  1,000 mg Oral BID Leandrew Koyanagi, MD   1,000 mg at 01/28/22 7654   magnesium citrate solution 1 Bottle  1 Bottle Oral Once PRN Leandrew Koyanagi, MD       menthol-cetylpyridinium (CEPACOL) lozenge 3 mg  1 lozenge Oral PRN Leandrew Koyanagi, MD       Or   phenol (CHLORASEPTIC) mouth spray 1 spray  1 spray Mouth/Throat PRN Leandrew Koyanagi, MD       methocarbamol (ROBAXIN) tablet 500 mg  500 mg Oral Q6H PRN Leandrew Koyanagi, MD   500 mg at 01/28/22 0205   Or   methocarbamol (ROBAXIN) 500 mg in dextrose 5 % 50 mL IVPB  500 mg Intravenous Q6H PRN Leandrew Koyanagi, MD       mirtazapine (REMERON) tablet 45 mg  45 mg Oral QHS Leandrew Koyanagi, MD   45 mg at 01/27/22 2118   ondansetron (ZOFRAN) tablet 4 mg  4 mg Oral Q6H PRN Leandrew Koyanagi, MD       Or   ondansetron [facility]) injection 4 mg  4 mg Intravenous Q6H PRN Leandrew Koyanagi, MD       oxyCODONE (Oxy IR/ROXICODONE) immediate release tablet 10-15 mg  10-15 mg Oral Q4H PRN Leandrew Koyanagi, MD       oxyCODONE (Oxy IR/ROXICODONE) immediate release tablet 5-10 mg  5-10 mg Oral Q4H PRN Leandrew Koyanagi, MD   10 mg at 01/28/22 0205   PHENobarbital (LUMINAL) tablet 97.2 mg  97.2 mg Oral 2 times per day on Mon Tue Wed Thu Fri Xu, Naiping M, MD   97.2 mg at 01/28/22 0851   [START ON 01/30/2022] PHENobarbital (LUMINAL) tablet 97.2 mg  97.2 mg Oral Once per day on Sun Sat Xu, Naiping M, MD       polyethylene glycol (MIRALAX / GLYCOLAX) packet 17 g  17 g Oral Daily PRN Leandrew Koyanagi, MD       sorbitol 70 % solution 30 mL  30 mL Oral Daily PRN Leandrew Koyanagi, MD       spironolactone (ALDACTONE) tablet 50 mg  50 mg Oral Daily Leandrew Koyanagi, MD   50 mg at 01/28/22 6503     Discharge Medications: Please see discharge summary for a list of discharge medications.  Relevant Imaging Results:  Relevant Lab Results:   Additional Information SSN: [id number].  Pt is vaccinated for covid with boosters.  Joanne Chars, LCSW

## 2022-01-28 NOTE — Progress Notes (Signed)
Mobility Specialist Progress Note   01/28/22 1828  Mobility  Activity Transferred from chair to bed  Level of Assistance Minimal assist, patient does 75% or more  Assistive Device Front wheel walker  Distance Ambulated (ft) 2 ft  LLE Weight Bearing WBAT  Activity Response Tolerated well  Mobility Referral Yes  $Mobility charge 1 Mobility   pt requesting assistance to get from chair to bed d/t fatigue. Required minA on rise during standing but pt able to stand and pivot w/ minG. Pt left in bed with all needs met, and call bell in reach and no new complaints.  Holland Falling Mobility Specialist Please contact via SecureChat or  Rehab office at [phone]

## 2022-01-28 NOTE — Plan of Care (Signed)

## 2022-01-28 NOTE — Progress Notes (Signed)
PROGRESS NOTE    Sandra Riley   UQJ:[id number] DOB: 04/02/1960  DOA: 01/26/2022 Date of Service: 01/28/22 PCP: Fayrene Helper, MD     Brief Narrative / Hospital Course:  Sandra Riley is a 61 y.o. female with medical history significant of anxiety and depression, epilepsy, hypertension, and more presents ED with a chief complaint of leg pain.  Patient reports that she got up in the night to go to the bathroom when she had a slip and fall in the hallway. No head trauma or LOC. Patient does have a seizure disorder. She cannot remember when the last time she had a seizure was. She reports she usually does feel an aura prior to a seizure.  12/26: XR in ED (+)Acute minimally displaced left femoral neck fracture ortho recommends transfer to [facility] planned for surgery 12/27   Consultants:  orthopedics  Procedures: fixation L hip fracture 12/27      ASSESSMENT & PLAN:   Principal Problem:   Hip fracture (HCC) Active Problems:   Moderate mood disorder (HCC)   Essential hypertension   Generalized convulsive epilepsy ([place])  Acute minimally displaced left femoral neck fracture Ortho consulted, admission at [facility] from [facility], plan THA w/ Dr Erlinda Hong  Pain control   Moderate mood disorder ([place]) Continue Remeron, Klonopin as needed  Essential hypertension Continue Norvasc and Aldactone  Generalized convulsive epilepsy ([place]) Continue home meds  Mild hyponatremia -resolved  DVT prophylaxis: per ortho   Code Status: FULL CODE   Disposition: inpatient  TOC needs: will likely need STR/HH Barriers to discharge / significant pending items: surgery 12/27      Subjective:  Denies painful urination, did have some cough this AM     Objective Findings:  Vitals:   01/28/22 0612 01/28/22 0657 01/28/22 0851 01/28/22 0937  BP:   121/61 134/76  Pulse:    (!) 101  Resp:    18  Temp: 98.7 F (37.1 C) (!) 100.6 F (38.1 C)  99.4 F (37.4 C)   TempSrc: Oral Oral  Oral  SpO2:    95%  Weight:      Height:        Intake/Output Summary (Last 24 hours) at 01/28/2022 1200 Last data filed at 01/28/2022 0700 Gross per 24 hour  Intake 2296.47 ml  Output 3825 ml  Net -1528.53 ml   Filed Weights   01/26/22 0201 01/27/22 1054  Weight: 63 kg 60.8 kg    Examination:   General: Appearance:     Overweight female in no acute distress     Lungs:     respirations unlabored  Heart:    Tachycardic.    MS:   All extremities are intact.   Neurologic:   Awake, alert       Scheduled Medications:   acetaminophen  1,000 mg Oral Q6H   amLODipine  5 mg Oral Daily   docusate sodium  100 mg Oral BID   enoxaparin (LOVENOX) injection  40 mg Subcutaneous Q24H   levETIRAcetam  1,000 mg Oral BID   mirtazapine  45 mg Oral QHS   PHENobarbital  97.2 mg Oral 2 times per day on Mon Tue Wed Thu Fri   [START ON 01/30/2022] phenobarbital  97.2 mg Oral Once per day on Sun Sat   spironolactone  50 mg Oral Daily    Continuous Infusions:  sodium chloride Stopped (01/28/22 0657)   methocarbamol (ROBAXIN) IV      PRN Medications:  acetaminophen, alum &  mag hydroxide-simeth, clonazePAM, HYDROmorphone (DILAUDID) injection, magnesium citrate, menthol-cetylpyridinium **OR** phenol, methocarbamol **OR** methocarbamol (ROBAXIN) IV, ondansetron **OR** ondansetron (ZOFRAN) IV, oxyCODONE, oxyCODONE, polyethylene glycol, sorbitol  Antimicrobials:  Anti-infectives (From admission, onward)    Start     Dose/Rate Route Frequency Ordered Stop   01/27/22 2000  ceFAZolin (ANCEF) IVPB 2g/100 mL premix        2 g 200 mL/hr over 30 Minutes Intravenous Every 6 hours 01/27/22 1716 01/28/22 0931   01/27/22 1439  vancomycin (VANCOCIN) powder  Status:  Discontinued          As needed 01/27/22 1440 01/27/22 1533   01/27/22 1200  ceFAZolin (ANCEF) IVPB 2g/100 mL premix        2 g 200 mL/hr over 30 Minutes Intravenous To Surgery 01/26/22 1558 01/27/22 1401   01/27/22  0730  ceFAZolin (ANCEF) IVPB 2g/100 mL premix  Status:  Discontinued        2 g 200 mL/hr over 30 Minutes Intravenous On call to O.R. 01/27/22 0631 01/27/22 1105           Data Reviewed: I have personally reviewed following labs and imaging studies  CBC: Recent Labs  Lab 01/26/22 0322 01/27/22 1826 01/28/22 0309  WBC 8.5 14.2* 11.4*  NEUTROABS 5.2  --   --   HGB 12.1 12.9 11.7*  HCT 36.8 36.5 33.6*  MCV 99.5 95.5 97.1  PLT 281 296 998   Basic Metabolic Panel: Recent Labs  Lab 01/26/22 0322 01/27/22 1826 01/28/22 0309  NA 133*  --  136  K 3.7  --  4.3  CL 98  --  97*  CO2 28  --  26  GLUCOSE 129*  --  112*  BUN 6*  --  6*  CREATININE 0.53 0.56 0.58  CALCIUM 9.4  --  8.9   GFR: Estimated Creatinine Clearance: 61.8 mL/min (by C-G formula based on SCr of 0.58 mg/dL). Liver Function Tests: No results for input(s): "AST", "ALT", "ALKPHOS", "BILITOT", "PROT", "ALBUMIN" in the last 168 hours. No results for input(s): "LIPASE", "AMYLASE" in the last 168 hours. No results for input(s): "AMMONIA" in the last 168 hours. Coagulation Profile: Recent Labs  Lab 01/26/22 0322  INR 0.9   Cardiac Enzymes: No results for input(s): "CKTOTAL", "CKMB", "CKMBINDEX", "TROPONINI" in the last 168 hours. BNP (last 3 results) No results for input(s): "PROBNP" in the last 8760 hours. HbA1C: No results for input(s): "HGBA1C" in the last 72 hours. CBG: No results for input(s): "GLUCAP" in the last 168 hours. Lipid Profile: No results for input(s): "CHOL", "HDL", "LDLCALC", "TRIG", "CHOLHDL", "LDLDIRECT" in the last 72 hours. Thyroid Function Tests: No results for input(s): "TSH", "T4TOTAL", "FREET4", "T3FREE", "THYROIDAB" in the last 72 hours. Anemia Panel: No results for input(s): "VITAMINB12", "FOLATE", "FERRITIN", "TIBC", "IRON", "RETICCTPCT" in the last 72 hours. Most Recent Urinalysis On File:     Component Value Date/Time   COLORURINE YELLOW 05/08/2008 0309   APPEARANCEUR  CLEAR 05/08/2008 0309   LABSPEC 1.014 05/08/2008 0309   PHURINE 6.0 05/08/2008 0309   GLUCOSEU NEG mg/dL 05/08/2008 0309   BILIRUBINUR NEG 05/08/2008 0309   KETONESUR NEG mg/dL 05/08/2008 0309   PROTEINUR NEG mg/dL 05/08/2008 0309   UROBILINOGEN 1 05/08/2008 0309   NITRITE NEG 05/08/2008 0309   LEUKOCYTESUR NEG 05/08/2008 0309   Sepsis Labs: '@LABRCNTIP'$ (procalcitonin:4,lacticidven:4)  Recent Results (from the past 240 hour(s))  Surgical pcr screen     Status: None   Collection Time: 01/27/22  6:36 AM   Specimen:  Nasal Mucosa; Nasal Swab  Result Value Ref Range Status   MRSA, PCR NEGATIVE NEGATIVE Final   Staphylococcus aureus NEGATIVE NEGATIVE Final    Comment: (NOTE) The Xpert SA Assay (FDA approved for NASAL specimens in patients 58 years of age and older), is one component of a comprehensive surveillance program. It is not intended to diagnose infection nor to guide or monitor treatment. Performed at [facility], [place] [address] [place], [place] [address]          Radiology Studies: DG Chest 1 View  Result Date: 01/26/2022 CLINICAL DATA:  Preop EXAM: CHEST  1 VIEW COMPARISON:  None Available. FINDINGS: Heart size upper limits of normal. No focal consolidation, pleural effusion, or pneumothorax. No acute osseous abnormality. IMPRESSION: No active disease. Electronically Signed   By: Placido Sou M.D.   On: 01/26/2022 03:10   DG Hip Unilat W or Wo Pelvis 2-3 Views Left  Result Date: 01/26/2022 CLINICAL DATA:  Left hip pain after fall EXAM: DG HIP (WITH OR WITHOUT PELVIS) 2-3V LEFT COMPARISON:  None Available. FINDINGS: Irregularity about the left femoral neck with foreshortening compatible acute femoral neck fracture. Body habitus limits assessment on lateral view. IMPRESSION: Acute minimally displaced left femoral neck fracture. Electronically Signed   By: Placido Sou M.D.   On: 01/26/2022 03:02            LOS: 2 days       Geradine Girt,  DO [facility] 01/28/2022, 12:00 PM    Staff may message me via secure chat in [place]  but this may not receive an immediate response,  please page me for urgent matters!  If 7PM-7AM, please contact night coverage [email or web address]

## 2022-01-28 NOTE — Progress Notes (Signed)
Subjective: 1 Day Post-Op Procedure(s) (LRB): TOTAL HIP ARTHROPLASTY ANTERIOR APPROACH (Left) Patient reports pain as mild.    Objective: Vital signs in last 24 hours: Temp:  [98.3 F (36.8 C)-100.6 F (38.1 C)] 100.6 F (38.1 C) (12/28 0657) Pulse Rate:  [93-114] 114 (12/28 0400) Resp:  [15-20] 17 (12/28 0400) BP: (140-176)/(68-86) 140/79 (12/28 0400) SpO2:  [93 %-100 %] 97 % (12/28 0400) Weight:  [60.8 kg] 60.8 kg (12/27 1054)  Intake/Output from previous day: 12/27 0701 - 12/28 0700 In: 2296.5 [P.O.:960; I.V.:936.4; IV Piggyback:400.1] Out: 3825 [Urine:3825] Intake/Output this shift: No intake/output data recorded.  Recent Labs    01/26/22 0322 01/27/22 1826 01/28/22 0309  HGB 12.1 12.9 11.7*   Recent Labs    01/27/22 1826 01/28/22 0309  WBC 14.2* 11.4*  RBC 3.82* 3.46*  HCT 36.5 33.6*  PLT 296 168   Recent Labs    01/26/22 0322 01/27/22 1826 01/28/22 0309  NA 133*  --  136  K 3.7  --  4.3  CL 98  --  97*  CO2 28  --  26  BUN 6*  --  6*  CREATININE 0.53 0.56 0.58  GLUCOSE 129*  --  112*  CALCIUM 9.4  --  8.9   Recent Labs    01/26/22 0322  INR 0.9    Neurologically intact Neurovascular intact Sensation intact distally Intact pulses distally Dorsiflexion/Plantar flexion intact Incision: dressing C/D/I No cellulitis present Compartment soft   Assessment/Plan: 1 Day Post-Op Procedure(s) (LRB): TOTAL HIP ARTHROPLASTY ANTERIOR APPROACH (Left) Up with therapy Weightbearing: WBAT LLE Insicional and dressing care: Dressings left intact until follow-up Orthopedic device(s): None Showering: pod #3.  Keep covered VTE prophylaxis: Lovenox '40mg'$  qd x 2 weeks Pain control: percocet Follow - up plan: 2 weeks Contact information:  xu MD, Venida Jarvis PA       Aundra Dubin 01/28/2022, 7:44 AM

## 2022-01-28 NOTE — Evaluation (Signed)
Occupational Therapy Evaluation Patient Details Name: Sandra Riley MRN: [id number] DOB: [birth year] Today's Date: 01/28/2022   History of Present Illness Pt is a 61 y/o female admitted s/p fall at home resulting in minimally displaced L femoral neck fx. Pt underwent L THA with anterior approach on 12/27. PMH: GAD, seizures, depression, HTN   Clinical Impression   PTA, pt lives with spouse, reports typically Modified Independent with ADLs and mobility using Rollator. Pt presents now with deficits in LLE pain, strength and standing balance. Pt requires Min-Mod A for BSC transfers using RW with consistent cues and assist needed to avoid premature sitting during transfers. Pt requires Min A for UB ADL and Max A for LB ADLs due to deficits. Based on current presentation, rec SNF rehab at [place] with pt in agreeable. However, pt has potential to progress home with continued mobility and if family able to provide moderate physical assistance. Will continue to follow acutely.      Recommendations for follow up therapy are one component of a multi-disciplinary discharge planning process, led by the attending physician.  Recommendations may be updated based on patient status, additional functional criteria and insurance authorization.   Follow Up Recommendations  Skilled nursing-short term rehab (<3 hours/day) (potential to progress home if family able to provide decent amount of physical assist)     Assistance Recommended at Discharge Frequent or constant Supervision/Assistance  Patient can return home with the following A lot of help with walking and/or transfers;A lot of help with bathing/dressing/bathroom    Functional Status Assessment  Patient has had a recent decline in their functional status and demonstrates the ability to make significant improvements in function in a reasonable and predictable amount of time.  Equipment Recommendations  BSC/3in1;Other (comment) (RW)    Recommendations  for Other Services       Precautions / Restrictions Precautions Precautions: Fall Restrictions Weight Bearing Restrictions: Yes LLE Weight Bearing: Weight bearing as tolerated      Mobility Bed Mobility Overal bed mobility: Needs Assistance Bed Mobility: Supine to Sit     Supine to sit: Mod assist, HOB elevated     General bed mobility comments: assist for LLE to EOB, scooting hips and handheld assist to lift trunk + bedrail use. mod sequencing cues    Transfers Overall transfer level: Needs assistance Equipment used: Rolling walker (2 wheels) Transfers: Sit to/from Stand, Bed to chair/wheelchair/BSC Sit to Stand: Min assist     Step pivot transfers: Mod assist     General transfer comment: Min A to stand from bed and BSC, cues for hand placement throughout. Mod A for managing RW to/from recliner and BSC with step cues and assist to swing hips due to premature sitting      Balance Overall balance assessment: Needs assistance Sitting-balance support: No upper extremity supported, Feet supported Sitting balance-Leahy Scale: Fair Sitting balance - Comments: LOB w/ challenges   Standing balance support: Bilateral upper extremity supported, During functional activity, Reliant on assistive device for balance Standing balance-Leahy Scale: Poor                             ADL either performed or assessed with clinical judgement   ADL Overall ADL's : Needs assistance/impaired Eating/Feeding: Independent;Sitting   Grooming: Minimal assistance;Standing   Upper Body Bathing: Minimal assistance;Sitting   Lower Body Bathing: Maximal assistance;Sit to/from stand   Upper Body Dressing : Minimal assistance;Sitting Upper Body Dressing Details (indicate cue  type and reason): assist to don clean gown sitting EOB d/t LOB w/ challenges Lower Body Dressing: Maximal assistance;Sit to/from stand   Toilet Transfer: Moderate assistance;Stand-pivot;BSC/3in1;Rolling walker  (2 wheels) Toilet Transfer Details (indicate cue type and reason): cues for sequencing steps, assist to swin hips on/off of BSC due to attempts to premature sit despite cues Toileting- Clothing Manipulation and Hygiene: Maximal assistance;Sit to/from stand Toileting - Clothing Manipulation Details (indicate cue type and reason): assist for hygiene in standing       General ADL Comments: Limited by post op pain, weakness and impaired balance     Vision Ability to See in Adequate Light: 0 Adequate Patient Visual Report: No change from baseline Vision Assessment?: No apparent visual deficits     Perception     Praxis      Pertinent Vitals/Pain Pain Assessment Pain Assessment: Faces Faces Pain Scale: Hurts little more Pain Location: L hip Pain Descriptors / Indicators: Throbbing Pain Intervention(s): Monitored during session, Patient requesting pain meds-RN notified, Repositioned, Limited activity within patient's tolerance     Hand Dominance Right   Extremity/Trunk Assessment Upper Extremity Assessment Upper Extremity Assessment: Overall WFL for tasks assessed   Lower Extremity Assessment Lower Extremity Assessment: Defer to PT evaluation   Cervical / Trunk Assessment Cervical / Trunk Assessment: Normal   Communication Communication Communication: No difficulties   Cognition Arousal/Alertness: Awake/alert Behavior During Therapy: WFL for tasks assessed/performed, Flat affect Overall Cognitive Status: No family/caregiver present to determine baseline cognitive functioning                                 General Comments: pleasant, does require consistent safety/sequencing and problem solving cues once up.     General Comments       Exercises     Shoulder Instructions      Home Living Family/patient expects to be discharged to:: Private residence Living Arrangements: Spouse/significant other Available Help at Discharge: Family;Available 24  hours/day Type of Home: Apartment Home Access: Level entry     Home Layout: One level     Bathroom Shower/Tub: Occupational psychologist: Handicapped height     Home Equipment: Rollator (4 wheels);Shower seat;Grab bars - tub/shower;Grab bars - toilet   Additional Comments: lives in a senior apartment      Prior Functioning/Environment Prior Level of Function : Independent/Modified Independent             Mobility Comments: use of rollator since R foot injury 2 years ago ADLs Comments: MOD I for ADLs, sits on chair for showers        OT Problem List: Decreased strength;Decreased activity tolerance;Impaired balance (sitting and/or standing);Decreased safety awareness;Decreased knowledge of use of DME or AE;Pain      OT Treatment/Interventions: Self-care/ADL training;Therapeutic exercise;Energy conservation;DME and/or AE instruction;Therapeutic activities;Patient/family education    OT Goals(Current goals can be found in the care plan section) Acute Rehab OT Goals Patient Stated Goal: feels rehab needed today but hopeful to continue progressing OT Goal Formulation: With patient Time For Goal Achievement: 02/11/22 Potential to Achieve Goals: Good  OT Frequency: Min 2X/week    Co-evaluation              AM-PAC OT "6 Clicks" Daily Activity     Outcome Measure Help from another person eating meals?: None Help from another person taking care of personal grooming?: A Little Help from another person toileting, which includes using toliet,  bedpan, or urinal?: A Lot Help from another person bathing (including washing, rinsing, drying)?: A Lot Help from another person to put on and taking off regular upper body clothing?: A Little Help from another person to put on and taking off regular lower body clothing?: A Lot 6 Click Score: 16   End of Session Equipment Utilized During Treatment: Rolling walker (2 wheels) Nurse Communication: Mobility status  Activity  Tolerance: Patient tolerated treatment well Patient left: in chair;with call bell/phone within reach;with chair alarm set;with nursing/sitter in room  OT Visit Diagnosis: Unsteadiness on feet (R26.81);Other abnormalities of gait and mobility (R26.89);Muscle weakness (generalized) (M62.81)                Time: 2130-8657 OT Time Calculation (min): 26 min Charges:  OT General Charges $OT Visit: 1 Visit OT Evaluation $OT Eval Moderate Complexity: 1 Mod OT Treatments $Self Care/Home Management : 8-22 mins  Malachy Chamber, OTR/L Acute Rehab Services Office: [phone]   Layla Maw 01/28/2022, 9:39 AM

## 2022-01-28 NOTE — Progress Notes (Signed)
Foley removed w/o any issues or complaints.  Pt is aware that she is due to void by 1300. Day shift RN aware.

## 2022-01-28 NOTE — TOC CAGE-AID Note (Signed)
Transition of Care [facility]) - CAGE-AID Screening   Patient Details  Name: Sandra Riley MRN: [id number] Date of Birth: 01/19/1961  [facility]) CM/SW Contact:    Army Melia, RN Phone Number:[phone] 01/28/2022, 2:55 AM   Clinical Narrative:  No hx of drug/alcohol use, no resources indicated.  CAGE-AID Screening:    Have You Ever Felt You Ought to Cut Down on Your Drinking or Drug Use?: No Have People Annoyed You By Critizing Your Drinking Or Drug Use?: No Have You Felt Bad Or Guilty About Your Drinking Or Drug Use?: No Have You Ever Had a Drink or Used Drugs First Thing In The Morning to Steady Your Nerves or to Get Rid of a Hangover?: No CAGE-AID Score: 0  Substance Abuse Education Offered: No

## 2022-01-28 NOTE — Evaluation (Signed)
Physical Therapy Evaluation Patient Details Name: Sandra Riley MRN: [id number] DOB: 08-Dec-1960 Today's Date: 01/28/2022  History of Present Illness  Pt is a 61 y/o female admitted s/p fall at home resulting in minimally displaced L femoral neck fx. Pt underwent L THA with anterior approach on 12/27. PMH: GAD, seizures, depression, HTN  Clinical Impression  Pt is presenting below baseline level  of functioning. Pt was previously independent with all mobility and ADL's. Currently pt requires Mod A for bed mobility and Min A for transfers and taking a few steps. Pt has significant gait impairments placing pt at high risk for falls. Currently recommending skilled physical therapy services in SNF setting on discharge from acute care hospital setting in order to return to PLOF and decrease risk for falls, injury and re-hospitalization. Pt demonstrates no signs/symptoms of cardiac/respiratory distress throughout session.      Recommendations for follow up therapy are one component of a multi-disciplinary discharge planning process, led by the attending physician.  Recommendations may be updated based on patient status, additional functional criteria and insurance authorization.  Follow Up Recommendations Skilled nursing-short term rehab (<3 hours/day) Can patient physically be transported by private vehicle: No    Assistance Recommended at Discharge Frequent or constant Supervision/Assistance  Patient can return home with the following  A little help with walking and/or transfers;Assistance with cooking/housework;Help with stairs or ramp for entrance    Equipment Recommendations Rolling walker (2 wheels);Other (comment) (defer to post acute)  Recommendations for Other Services       Functional Status Assessment Patient has had a recent decline in their functional status and demonstrates the ability to make significant improvements in function in a reasonable and predictable amount of time.      Precautions / Restrictions Precautions Precautions: Fall Restrictions Weight Bearing Restrictions: Yes LLE Weight Bearing: Weight bearing as tolerated      Mobility  Bed Mobility Overal bed mobility: Needs Assistance Bed Mobility: Supine to Sit, Sit to Supine     Supine to sit: Mod assist, HOB elevated Sit to supine: Min guard   General bed mobility comments: Pt was assisted for LLE to EOB and at the trunk at Mod A for supine to sitting. Pt was able to perform sitting to supine at [facility] with verbal cues for correct sequencing including scooting up in the bed. Patient Response: Cooperative  Transfers Overall transfer level: Needs assistance Equipment used: Rolling walker (2 wheels) Transfers: Sit to/from Stand, Bed to chair/wheelchair/BSC Sit to Stand: Min assist   Step pivot transfers: Min assist       General transfer comment: Min A for all aspects of transfer this afternoon. Verbal cues for improved alignment with bedside commode before sitting.    Ambulation/Gait Ambulation/Gait assistance: Min assist Gait Distance (Feet): 3 Feet (2x) Assistive device: Rolling walker (2 wheels) Gait Pattern/deviations: Step-to pattern, Antalgic, Decreased step length - left, Decreased step length - right, Decreased stance time - left Gait velocity: decreased cadence. Gait velocity interpretation: <1.31 ft/sec, indicative of household ambulator   General Gait Details: pt demonstrats significant hip drop on the L with L stance leg, very small steps, low foot clearance.  Stairs Stairs:  (not applicable pt does not have stairs.)          Wheelchair Mobility    Modified Rankin (Stroke Patients Only)       Balance Overall balance assessment: Needs assistance Sitting-balance support: No upper extremity supported, Feet supported Sitting balance-Leahy Scale: Good     Standing  balance support: Bilateral upper extremity supported, During functional activity, Reliant on  assistive device for balance Standing balance-Leahy Scale: Fair Standing balance comment: reliant on AD for balance with dynamic activities pt was able to assist donning/doffing brief without UE support and CGA.             Pertinent Vitals/Pain Pain Assessment Pain Assessment: 0-10 Pain Score: 8  Pain Location: L hip Pain Descriptors / Indicators: Throbbing Pain Intervention(s): Limited activity within patient's tolerance, Monitored during session, Premedicated before session    [place] expects to be discharged to:: Private residence Living Arrangements: Spouse/significant other Available Help at Discharge: Family;Available 24 hours/day Type of Home: Apartment Home Access: Level entry       Home Layout: One level Home Equipment: Rollator (4 wheels);Shower seat;Grab bars - tub/shower;Grab bars - toilet Additional Comments: lives in a senior apartment    Prior Function Prior Level of Function : Independent/Modified Independent             Mobility Comments: use of rollator since R foot injury 2 years ago ADLs Comments: MOD I for ADLs, sits on chair for showers     Hand Dominance   Dominant Hand: Right    Extremity/Trunk Assessment   Upper Extremity Assessment Upper Extremity Assessment: Defer to OT evaluation    Lower Extremity Assessment Lower Extremity Assessment: Generalized weakness;LLE deficits/detail LLE Deficits / Details: recent THA due to femur fracture LLE: Unable to fully assess due to pain    Cervical / Trunk Assessment Cervical / Trunk Assessment: Normal  Communication   Communication: No difficulties  Cognition Arousal/Alertness: Awake/alert Behavior During Therapy: WFL for tasks assessed/performed, Flat affect Overall Cognitive Status: Within Functional Limits for tasks assessed                Assessment/Plan    PT Assessment Patient needs continued PT services  PT Problem List Decreased strength;Decreased  activity tolerance;Decreased mobility;Decreased balance;Decreased safety awareness;Pain       PT Treatment Interventions DME instruction;Stair training;Therapeutic activities;Balance training;Gait training;Functional mobility training;Therapeutic exercise;Neuromuscular re-education;Patient/family education;Manual techniques    PT Goals (Current goals can be found in the Care Plan section)  Acute Rehab PT Goals Patient Stated Goal: To get stronger PT Goal Formulation: With patient Time For Goal Achievement: 02/11/22 Potential to Achieve Goals: Fair    Frequency Min 3X/week        AM-PAC PT "6 Clicks" Mobility  Outcome Measure Help needed turning from your back to your side while in a flat bed without using bedrails?: A Lot Help needed moving from lying on your back to sitting on the side of a flat bed without using bedrails?: A Lot Help needed moving to and from a bed to a chair (including a wheelchair)?: A Little Help needed standing up from a chair using your arms (e.g., wheelchair or bedside chair)?: A Little Help needed to walk in hospital room?: A Little Help needed climbing 3-5 steps with a railing? : A Lot 6 Click Score: 15    End of Session Equipment Utilized During Treatment: Gait belt Activity Tolerance: Patient tolerated treatment well Patient left: in bed;with bed alarm set;with call bell/phone within reach Nurse Communication: Mobility status PT Visit Diagnosis: Unsteadiness on feet (R26.81);Other abnormalities of gait and mobility (R26.89);Muscle weakness (generalized) (M62.81)    Time: 7209-4709 PT Time Calculation (min) (ACUTE ONLY): 23 min   Charges:   PT Evaluation $PT Eval Low Complexity: 1 Low PT Treatments $Therapeutic Exercise: 8-22 mins  Tomma Rakers, DPT, [place]  Acute Rehabilitation Services Office: [phone] (Secure chat preferred)   Ander Purpura 01/28/2022, 1:45 PM

## 2022-01-28 NOTE — TOC Initial Note (Signed)
Transition of Care [facility]) - Initial/Assessment Note    Patient Details  Name: Sandra Riley MRN: [id number] Date of Birth: November 10, 1960  Transition of Care [facility]) CM/SW Contact:    Joanne Chars, LCSW Phone Number: 01/28/2022, 3:20 PM  Clinical Narrative:    CSW met with pt regarding DC recommendation for SNF.  Pt agreeable to this, Medicare choice document given, permission given to send out referral on hub.  Pt from [place], would like SNF in [facility].  Permission given to speak with husband Coralyn Mark and sister Otilio Saber.  Pt lives with husband in apartment, no current services.  Pt  is vaccinated for covid with multiple boosters.  PASSR went to level 2.              Expected Discharge Plan: Skilled Nursing Facility Barriers to Discharge: Continued Medical Work up, SNF Pending bed offer   Patient Goals and CMS Choice Patient states their goals for this hospitalization and ongoing recovery are:: better health CMS Medicare.gov Compare Post Acute Care list provided to:: Patient Choice offered to / list presented to : Patient      Expected Discharge Plan and Services In-house Referral: Clinical Social Work   Post Acute Care Choice: [place] Living arrangements for the past 2 months: Apartment                                      Prior Living Arrangements/Services Living arrangements for the past 2 months: Apartment Lives with:: Spouse Patient language and need for interpreter reviewed:: Yes Do you feel safe going back to the place where you live?: Yes      Need for Family Participation in Patient Care: Yes (Comment) Care giver support system in place?: Yes (comment) Current home services: Other (comment) (none) Criminal Activity/Legal Involvement Pertinent to Current Situation/Hospitalization: No - Comment as needed  Activities of Daily Living      Permission Sought/Granted Permission sought to share information with : Family  Supports Permission granted to share information with : Yes, Verbal Permission Granted  Share Information with NAME: husband Coralyn Mark, sister Otilio Saber  Permission granted to share info w AGENCY: SNF        Emotional Assessment Appearance:: Appears stated age Attitude/Demeanor/Rapport: Engaged Affect (typically observed): Appropriate, Pleasant        Admission diagnosis:  Hip fracture ([place]) [S72.009A] Closed fracture of left hip, initial encounter [facility]) [S72.002A] Patient Active Problem List   Diagnosis Date Noted   Displaced fracture of base of neck of left femur, initial encounter for closed fracture ([place]) 01/26/2022   Encounter for annual physical exam 12/09/2021   Depression, major, single episode, severe ([place]) 04/12/2021   Grief at loss of child 04/10/2021   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   Essential hypertension 12/02/2019   Osteoporosis without current pathological fracture 04/27/2019   Vitamin D deficiency 12/20/2010   ALLERGIC RHINITIS, SEASONAL 09/04/2007   Moderate mood disorder ([place]) 02/21/2007   Mixed insomnia 02/21/2007   PCP:  Fayrene Helper, MD Pharmacy:   [facility], [place] [address] Phone: [phone] Fax: [place], [place] - [address] [place] [address] [id number] [address] [place] [address] Phone: [phone] Fax: [phone]     Social Determinants of Health (SDOH) Social History: [place]  Insecurity: No Food Insecurity (01/28/2021)  Housing: Low Risk  (01/28/2021)  Transportation Needs: No Transportation Needs (01/28/2021)  Alcohol Screen: Low Risk  (01/28/2021)  Depression (PHQ2-9): Low Risk  (12/09/2021)  Financial Resource Strain: Low Risk  (01/28/2021)  Recent Concern: Financial Resource Strain - Medium Risk (11/13/2020)  Physical Activity: Insufficiently Active (11/13/2020)   Social Connections: Moderately Integrated (01/28/2021)  Stress: Stress Concern Present (06/19/2021)  Tobacco Use: Low Risk  (01/27/2022)   SDOH Interventions:     Readmission Risk Interventions     No data to display

## 2022-01-29 ENCOUNTER — Inpatient Hospital Stay (HOSPITAL_COMMUNITY): Payer: Medicare Other

## 2022-01-29 ENCOUNTER — Encounter (HOSPITAL_COMMUNITY): Payer: Self-pay | Admitting: Orthopaedic Surgery

## 2022-01-29 DIAGNOSIS — S72042A Displaced fracture of base of neck of left femur, initial encounter for closed fracture: Secondary | ICD-10-CM | POA: Diagnosis not present

## 2022-01-29 LAB — BASIC METABOLIC PANEL
Anion gap: 14 (ref 5–15)
BUN: <5 mg/dL — ABNORMAL LOW (ref 8–23)
CO2: 25 mmol/L (ref 22–32)
Calcium: 9 mg/dL (ref 8.9–10.3)
Chloride: 96 mmol/L — ABNORMAL LOW (ref 98–111)
Creatinine, Ser: 0.63 mg/dL (ref 0.44–1.00)
GFR, Estimated: >60 mL/min (ref >60)
Glucose, Bld: 111 mg/dL — ABNORMAL HIGH (ref 70–99)
Potassium: 3.4 mmol/L — ABNORMAL LOW (ref 3.5–5.1)
Sodium: 135 mmol/L (ref 135–145)

## 2022-01-29 LAB — CBC
HCT: 33.6 % — ABNORMAL LOW (ref 36.0–46.0)
Hemoglobin: 11.1 g/dL — ABNORMAL LOW (ref 12.0–15.0)
MCH: 32.7 pg (ref 26.0–34.0)
MCHC: 33 g/dL (ref 30.0–36.0)
MCV: 99.1 fL (ref 80.0–100.0)
Platelets: 246 10*3/uL (ref 150–400)
RBC: 3.39 MIL/uL — ABNORMAL LOW (ref 3.87–5.11)
RDW: 12.2 % (ref 11.5–15.5)
WBC: 19.1 10*3/uL — ABNORMAL HIGH (ref 4.0–10.5)
nRBC: 0 % (ref 0.0–0.2)

## 2022-01-29 MED ORDER — POTASSIUM CHLORIDE CRYS ER 20 MEQ PO TBCR
40.0000 meq | EXTENDED_RELEASE_TABLET | Freq: Once | ORAL | Status: AC
  Administered 2022-01-29: 40 meq via ORAL
  Filled 2022-01-29: qty 2

## 2022-01-29 MED ORDER — AZITHROMYCIN 250 MG PO TABS
500.0000 mg | ORAL_TABLET | Freq: Every day | ORAL | Status: AC
  Administered 2022-01-29 – 2022-02-02 (×5): 500 mg via ORAL
  Filled 2022-01-29 (×2): qty 1
  Filled 2022-01-29: qty 2
  Filled 2022-01-29: qty 1
  Filled 2022-01-29 (×2): qty 2

## 2022-01-29 MED ORDER — SODIUM CHLORIDE 0.9 % IV SOLN
2.0000 g | INTRAVENOUS | Status: AC
  Administered 2022-01-29 – 2022-02-02 (×5): 2 g via INTRAVENOUS
  Filled 2022-01-29 (×5): qty 20

## 2022-01-29 NOTE — Care Management Important Message (Signed)
Important Message  Patient Details  Name: Sandra Riley MRN: [id number] Date of Birth: 05-Aug-1960   Medicare Important Message Given:  Yes     Hannah Beat 01/29/2022, 11:00 AM

## 2022-01-29 NOTE — TOC Progression Note (Addendum)
Transition of Care [facility]) - Progression Note    Patient Details  Name: Sandra Riley MRN: [id number] Date of Birth: November 08, 1960  Transition of Care [facility]) CM/SW Contact  Joanne Chars, LCSW Phone Number: 01/29/2022, 11:55 AM  Clinical Narrative:  CSW presented bed offers to pt and she wants to accept offer at [place] from [facility] that their first available bed will be Tuesday.  Will need auth.   PASSR received: [id number] E.   Expected Discharge Plan: [place] Barriers to Discharge: Continued Medical Work up, SNF Pending bed offer  Expected Discharge Plan and Services In-house Referral: Clinical Social Work   Post Acute Care Choice: [place] Living arrangements for the past 2 months: Apartment                                       Social Determinants of Health (SDOH) Interventions SDOH Screenings   Food Insecurity: No Food Insecurity (01/28/2021)  Housing: Low Risk  (01/28/2021)  Transportation Needs: No Transportation Needs (01/28/2021)  Alcohol Screen: Low Risk  (01/28/2021)  Depression (PHQ2-9): Low Risk  (12/09/2021)  Financial Resource Strain: Low Risk  (01/28/2021)  Recent Concern: Financial Resource Strain - Medium Risk (11/13/2020)  Physical Activity: Insufficiently Active (11/13/2020)  Social Connections: Moderately Integrated (01/28/2021)  Stress: Stress Concern Present (06/19/2021)  Tobacco Use: Low Risk  (01/28/2022)    Readmission Risk Interventions     No data to display

## 2022-01-29 NOTE — Progress Notes (Signed)
Mobility Specialist Progress Note   01/29/22 1719  Mobility  Activity Transferred from bed to chair  Level of Assistance Minimal assist, patient does 75% or more  Assistive Device Front wheel walker  Distance Ambulated (ft) 2 ft  LLE Weight Bearing WBAT  Activity Response Tolerated well  $Mobility charge 1 Mobility   Received in bed having mild L hip pain(5/10) but agreeable. MinA to EOB d/t dec pain tolerance, MinA to stand w/ limited strength presenting in pts L hip causing a flexed trunk position. To the chair w/o fault, call bell placed in reach and chair alarm on.    [place] Mobility Specialist Please contact via SecureChat or  Rehab office at [phone]

## 2022-01-29 NOTE — Progress Notes (Signed)
On assessment, found a hard mass in the abdomen (left lower quadrant), painful when palpated. Patient unaware about the presence of that mass, unsure about her last bowel movement, says may be on 25 th of December. NP Raenette Rover notified, PRN miralax given as suggested. Will continue to monitor the patient.

## 2022-01-29 NOTE — Progress Notes (Signed)
PROGRESS NOTE    Sandra Riley   DXI:[id number] DOB: 1960-12-09  DOA: 01/26/2022 Date of Service: 01/29/22 PCP: Sandra Helper, MD     Brief Narrative / Hospital Course:  Sandra Riley is a 61 y.o. female with medical history significant of anxiety and depression, epilepsy, hypertension, and more presents ED with a chief complaint of leg pain.  Patient reports that she got up in the night to go to the bathroom when she had a slip and fall in the hallway. No head trauma or LOC. Patient does have a seizure disorder. She cannot remember when the last time she had a seizure was. She reports she usually does feel an aura prior to a seizure.  12/26: XR in ED (+)Acute minimally displaced left femoral neck fracture ortho recommends transfer to Sandra Riley planned for surgery 12/27   Consultants:  orthopedics  Procedures: fixation L hip fracture 12/27      ASSESSMENT & PLAN:   Principal Problem:   Hip fracture (HCC) Active Problems:   Moderate mood disorder (HCC)   Essential hypertension   Generalized convulsive epilepsy (Sandra Riley)  Acute minimally displaced left femoral neck fracture -Ortho consulted, [facility], [place] w/ Dr Sandra Riley  -Pain control   Suspected pna -cough -xray + and WBC elevated -will start abx and monitor for response  Moderate mood disorder (HCC) -Continue Remeron, Klonopin as needed  Essential hypertension -Continue Norvasc and Aldactone  Generalized convulsive epilepsy ([place]) -Continue home meds  Mild hyponatremia -resolved  Hypokalemia -repelte  DVT prophylaxis: per ortho   Code Status: FULL CODE   Disposition: inpatient  TOC needs: will likely need STR/HH- bed available on Tuesday       Subjective:  Still with cough Minimal hip pain   Objective Findings:  Vitals:   01/28/22 1421 01/28/22 2014 01/28/22 2353 01/29/22 0444  BP: 128/68 (!) 141/67  124/75  Pulse: 100 100  (!) 109  Resp: '18  18  17  '$ Temp: 99.4 F (37.4 C) (!) 100.4 F (38 C) 99 F (37.2 C) 99.2 F (37.3 C)  TempSrc: Oral Oral Oral Oral  SpO2: 99% 96%  98%  Weight:      Height:        Intake/Output Summary (Last 24 hours) at 01/29/2022 1305 Last data filed at 01/29/2022 0645 Gross per 24 hour  Intake 519.36 ml  Output 800 ml  Net -280.64 ml   Filed Weights   01/26/22 0201 01/27/22 1054  Weight: 63 kg 60.8 kg    Examination:    General: Appearance:    Well developed, well nourished female in no acute distress     Lungs:     respirations unlabored  Heart:    Tachycardic. Normal rhythm. No murmurs, rubs, or gallops.   MS:   All extremities are intact.   Neurologic:   Awake, alert       Scheduled Medications:   amLODipine  5 mg Oral Daily   azithromycin  500 mg Oral Daily   docusate sodium  100 mg Oral BID   enoxaparin (LOVENOX) injection  40 mg Subcutaneous Q24H   levETIRAcetam  1,000 mg Oral BID   mirtazapine  45 mg Oral QHS   PHENobarbital  97.2 mg Oral 2 times per day on Mon Tue Wed Thu Fri   [START ON 01/30/2022] phenobarbital  97.2 mg Oral Once per day on Sun Sat   spironolactone  50 mg Oral Daily    Continuous Infusions:  cefTRIAXone (ROCEPHIN)  IV     methocarbamol (ROBAXIN) IV      PRN Medications:  acetaminophen, alum & mag hydroxide-simeth, clonazePAM, HYDROmorphone (DILAUDID) injection, magnesium citrate, menthol-cetylpyridinium **OR** phenol, methocarbamol **OR** methocarbamol (ROBAXIN) IV, ondansetron **OR** ondansetron (ZOFRAN) IV, oxyCODONE, oxyCODONE, polyethylene glycol, sorbitol  Antimicrobials:  Anti-infectives (From admission, onward)    Start     Dose/Rate Route Frequency Ordered Stop   01/29/22 1400  cefTRIAXone (ROCEPHIN) 2 g in sodium chloride 0.9 % 100 mL IVPB        2 g 200 mL/hr over 30 Minutes Intravenous Every 24 hours 01/29/22 1303 02/03/22 1359   01/29/22 1400  azithromycin (ZITHROMAX) tablet 500 mg        500 mg Oral Daily 01/29/22 1303 02/03/22  0959   01/27/22 2000  ceFAZolin (ANCEF) IVPB 2g/100 mL premix        2 g 200 mL/hr over 30 Minutes Intravenous Every 6 hours 01/27/22 1716 01/28/22 0931   01/27/22 1439  vancomycin (VANCOCIN) powder  Status:  Discontinued          As needed 01/27/22 1440 01/27/22 1533   01/27/22 1200  ceFAZolin (ANCEF) IVPB 2g/100 mL premix        2 g 200 mL/hr over 30 Minutes Intravenous To Surgery 01/26/22 1558 01/27/22 1401   01/27/22 0730  ceFAZolin (ANCEF) IVPB 2g/100 mL premix  Status:  Discontinued        2 g 200 mL/hr over 30 Minutes Intravenous On call to O.R. 01/27/22 0631 01/27/22 1105           Data Reviewed: I have personally reviewed following labs and imaging studies  CBC: Recent Labs  Lab 01/26/22 0322 01/27/22 1826 01/28/22 0309 01/29/22 0228  WBC 8.5 14.2* 11.4* 19.1*  NEUTROABS 5.2  --   --   --   HGB 12.1 12.9 11.7* 11.1*  HCT 36.8 36.5 33.6* 33.6*  MCV 99.5 95.5 97.1 99.1  PLT 281 296 168 563   Basic Metabolic Panel: Recent Labs  Lab 01/26/22 0322 01/27/22 1826 01/28/22 0309 01/29/22 0228  NA 133*  --  136 135  K 3.7  --  4.3 3.4*  CL 98  --  97* 96*  CO2 28  --  26 25  GLUCOSE 129*  --  112* 111*  BUN 6*  --  6* <5*  CREATININE 0.53 0.56 0.58 0.63  CALCIUM 9.4  --  8.9 9.0   GFR: Estimated Creatinine Clearance: 61.8 mL/min (by C-G formula based on SCr of 0.63 mg/dL). Liver Function Tests: No results for input(s): "AST", "ALT", "ALKPHOS", "BILITOT", "PROT", "ALBUMIN" in the last 168 hours. No results for input(s): "LIPASE", "AMYLASE" in the last 168 hours. No results for input(s): "AMMONIA" in the last 168 hours. Coagulation Profile: Recent Labs  Lab 01/26/22 0322  INR 0.9   Cardiac Enzymes: No results for input(s): "CKTOTAL", "CKMB", "CKMBINDEX", "TROPONINI" in the last 168 hours. BNP (last 3 results) No results for input(s): "PROBNP" in the last 8760 hours. HbA1C: No results for input(s): "HGBA1C" in the last 72 hours. CBG: No results for  input(s): "GLUCAP" in the last 168 hours. Lipid Profile: No results for input(s): "CHOL", "HDL", "LDLCALC", "TRIG", "CHOLHDL", "LDLDIRECT" in the last 72 hours. Thyroid Function Tests: No results for input(s): "TSH", "T4TOTAL", "FREET4", "T3FREE", "THYROIDAB" in the last 72 hours. Anemia Panel: No results for input(s): "VITAMINB12", "FOLATE", "FERRITIN", "TIBC", "IRON", "RETICCTPCT" in the last 72 hours. Most Recent Urinalysis On File:  Component Value Date/Time   COLORURINE YELLOW 01/28/2022 1735   APPEARANCEUR CLEAR 01/28/2022 1735   LABSPEC 1.006 01/28/2022 1735   PHURINE 5.0 01/28/2022 1735   GLUCOSEU NEGATIVE 01/28/2022 1735   GLUCOSEU NEG mg/dL 05/08/2008 0309   HGBUR LARGE (A) 01/28/2022 1735   BILIRUBINUR NEGATIVE 01/28/2022 1735   KETONESUR NEGATIVE 01/28/2022 1735   PROTEINUR 30 (A) 01/28/2022 1735   UROBILINOGEN 1 05/08/2008 0309   NITRITE NEGATIVE 01/28/2022 1735   LEUKOCYTESUR SMALL (A) 01/28/2022 1735   Sepsis Labs: '@LABRCNTIP'$ (procalcitonin:4,lacticidven:4)  Recent Results (from the past 240 hour(s))  Surgical pcr screen     Status: None   Collection Time: 01/27/22  6:36 AM   Specimen: Nasal Mucosa; Nasal Swab  Result Value Ref Range Status   MRSA, PCR NEGATIVE NEGATIVE Final   Staphylococcus aureus NEGATIVE NEGATIVE Final    Comment: (NOTE) The Xpert SA Assay (FDA approved for NASAL specimens in patients 73 years of age and older), is one component of a comprehensive surveillance program. It is not intended to diagnose infection nor to guide or monitor treatment. Performed at [facility], [place] [address], [place], [place] [address]          Radiology Studies: DG Chest 1 View  Result Date: 01/26/2022 CLINICAL DATA:  Preop EXAM: CHEST  1 VIEW COMPARISON:  None Available. FINDINGS: Heart size upper limits of normal. No focal consolidation, pleural effusion, or pneumothorax. No acute osseous abnormality. IMPRESSION: No active disease.  Electronically Signed   By: Placido Sou M.D.   On: 01/26/2022 03:10   DG Hip Unilat W or Wo Pelvis 2-3 Views Left  Result Date: 01/26/2022 CLINICAL DATA:  Left hip pain after fall EXAM: DG HIP (WITH OR WITHOUT PELVIS) 2-3V LEFT COMPARISON:  None Available. FINDINGS: Irregularity about the left femoral neck with foreshortening compatible acute femoral neck fracture. Body habitus limits assessment on lateral view. IMPRESSION: Acute minimally displaced left femoral neck fracture. Electronically Signed   By: Placido Sou M.D.   On: 01/26/2022 03:02            LOS: 3 days       Geradine Girt, DO [facility] 01/29/2022, 1:05 PM    Staff may message me via secure chat in [place]  but this may not receive an immediate response,  please page me for urgent matters!  If 7PM-7AM, please contact night coverage [email or web address]

## 2022-01-30 ENCOUNTER — Inpatient Hospital Stay (HOSPITAL_COMMUNITY): Payer: Medicare Other

## 2022-01-30 DIAGNOSIS — S72042A Displaced fracture of base of neck of left femur, initial encounter for closed fracture: Secondary | ICD-10-CM | POA: Diagnosis not present

## 2022-01-30 LAB — CBC
HCT: 37 % (ref 36.0–46.0)
Hemoglobin: 11.9 g/dL — ABNORMAL LOW (ref 12.0–15.0)
MCH: 32.8 pg (ref 26.0–34.0)
MCHC: 32.2 g/dL (ref 30.0–36.0)
MCV: 101.9 fL — ABNORMAL HIGH (ref 80.0–100.0)
Platelets: 210 10*3/uL (ref 150–400)
RBC: 3.63 MIL/uL — ABNORMAL LOW (ref 3.87–5.11)
RDW: 12.3 % (ref 11.5–15.5)
WBC: 17.7 10*3/uL — ABNORMAL HIGH (ref 4.0–10.5)
nRBC: 0 % (ref 0.0–0.2)

## 2022-01-30 LAB — BASIC METABOLIC PANEL
Anion gap: 12 (ref 5–15)
BUN: 6 mg/dL — ABNORMAL LOW (ref 8–23)
CO2: 26 mmol/L (ref 22–32)
Calcium: 8.7 mg/dL — ABNORMAL LOW (ref 8.9–10.3)
Chloride: 97 mmol/L — ABNORMAL LOW (ref 98–111)
Creatinine, Ser: 0.56 mg/dL (ref 0.44–1.00)
GFR, Estimated: >60 mL/min (ref >60)
Glucose, Bld: 77 mg/dL (ref 70–99)
Potassium: 4.2 mmol/L (ref 3.5–5.1)
Sodium: 135 mmol/L (ref 135–145)

## 2022-01-30 MED ORDER — TAMSULOSIN HCL 0.4 MG PO CAPS
0.4000 mg | ORAL_CAPSULE | Freq: Every day | ORAL | Status: DC
  Administered 2022-01-30 – 2022-02-01 (×3): 0.4 mg via ORAL
  Filled 2022-01-30 (×3): qty 1

## 2022-01-30 NOTE — Progress Notes (Signed)
       CROSS COVER NOTE  NAME: Angenette Daily MRN: [id number] DOB : 1960/07/15    Date of Service   01/30/2022   HPI/Events of Note   RN reports "hard mass" in the patient's left lower abdominal quadrant.    Per RN, patient endorses some tenderness only with deep palpation.  Negative rebound tenderness, distention, or nausea.  RN reports patient has active bowel sounds and is passing gas.  Uncertain of when last bowel movement was.  Patient estimates 12/25.  .   Interventions/ Plan   Bowel regimen started.   KUB this morning if unable to have a bowel movement       Raenette Rover, DNP, [place]

## 2022-01-30 NOTE — Progress Notes (Signed)
PROGRESS NOTE Sandra Riley  WUJ:[id number] DOB: 1960-09-09 DOA: 01/26/2022 PCP: Fayrene Helper, MD   Brief Narrative/Hospital Course: 61 y.o. female with medical history significant of anxiety and depression, epilepsy, hypertension, and more presents ED with a chief complaint of leg pain.  Patient reports that she got up in the night to go to the bathroom when she had a slip and fall in the hallway. No head trauma or LOC. Patient does have a seizure disorder. She cannot remember when the last time she had a seizure was. She reports she usually does feel an aura prior to a seizure. 12/26: XR in ED (+)Acute minimally displaced left femoral neck fracture ortho recommends transfer to [facility]. 12/27 arrived at [facility] and underwent left hip fracture surgical repair 12/27. Mobilizing with PT OT, continue for suspected pneumonia. Overall doing well awaiting for skilled nursing facility placement.   Subjective: Seen and examined.  She is resting comfortably on the bedside chair.  Has no complaint Overnight afebrile BP stable on room air Labs reviewed has had leukocytosis but downtrending  Assessment and Plan: Principal Problem:   Displaced fracture of base of neck of left femur, initial encounter for closed fracture [facility]) Active Problems:   Moderate mood disorder (HCC)   Essential hypertension   Generalized convulsive epilepsy ([place])   Left femoral neck fracture: Secondary to fall status post THA 12/27 Dr. Candiss Norse continue pain control, DVT prophylaxis with Lovenox, cont ptot and snf palcement.  Suspected pneumonia with cough chest x-ray positive and with leukocytosis.  Continue ceftriaxone/azithromycin.  WBC count slowly downtrending.  Monitor Recent Labs  Lab 01/26/22 0322 01/27/22 1826 01/28/22 0309 01/29/22 0228 01/30/22 0239  WBC 8.5 14.2* 11.4* 19.1* 17.7*     Mood disorder on Remeron Klonopin PRN Essential hypertension BP controlled on Norvasc and Aldactone Generalized  convulsive epilepsy continue her phenobarbital, Keppra Hypokalemia resolved Hyponatremia:Resolved Constipation: LLQ mass overnight on Rn exam. Pt unaware.  X-ray was done shows large colonic stool burden, continue bowel regimen evaluation.  DVT prophylaxis: enoxaparin (LOVENOX) injection 40 mg Start: 01/28/22 0800 SCDs Start: 01/27/22 1717 Place TED hose Start: 01/27/22 1717 SCDs Start: 01/26/22 0547 Code Status:   Code Status: Full Code Family Communication: plan of care discussed with patient/ at bedside. Patient status is: INPATIENT because of HIP FRACTURE Level of care: Med-Surg   Dispo: The patient is from: home            Anticipated disposition: SNF Objective: Vitals last 24 hrs: Vitals:   01/29/22 0444 01/29/22 1300 01/29/22 2100 01/30/22 0955  BP: 124/75 133/72 (!) 127/45 (!) 125/51  Pulse: (!) 109 (!) 110 (!) 104 100  Resp: '17 18 18 20  '$ Temp: 99.2 F (37.3 C) 98 F (36.7 C) 99.5 F (37.5 C) 98.5 F (36.9 C)  TempSrc: Oral  Oral Oral  SpO2: 98% 99% 99% 99%  Weight:      Height:       Weight change:   Physical Examination: General exam: alert awake, older than stated age HEENT:Oral mucosa moist, Ear/Nose WNL grossly Respiratory system: bilaterally clear BS, no use of accessory muscle Cardiovascular system: S1 & S2 +, No JVD. Gastrointestinal system: Abdomen soft,NT,ND, BS+ Nervous System:Alert, awake, moving extremities. Extremities: LE edema neg left hip  incision site with Aquacel dressing in place,distal peripheral pulses palpable.  Skin: No rashes,no icterus. MSK: Normal muscle bulk,tone, power  Medications reviewed:  Scheduled Meds:  amLODipine  5 mg Oral Daily   azithromycin  500 mg Oral  Daily   docusate sodium  100 mg Oral BID   enoxaparin (LOVENOX) injection  40 mg Subcutaneous Q24H   levETIRAcetam  1,000 mg Oral BID   mirtazapine  45 mg Oral QHS   PHENobarbital  97.2 mg Oral 2 times per day on Mon Tue Wed Thu Fri   phenobarbital  97.2 mg Oral  Once per day on Sun Sat   spironolactone  50 mg Oral Daily   Continuous Infusions:  cefTRIAXone (ROCEPHIN)  IV 2 g (01/29/22 1455)   methocarbamol (ROBAXIN) IV      Diet Order             Diet Carb Modified Fluid consistency: Thin; Room service appropriate? Yes  Diet effective now                   Intake/Output Summary (Last 24 hours) at 01/30/2022 1205 Last data filed at 01/30/2022 0719 Gross per 24 hour  Intake 340 ml  Output 806 ml  Net -466 ml   Net IO Since Admission: -3,795.17 mL [01/30/22 1205]  Wt Readings from Last 3 Encounters:  01/27/22 60.8 kg  12/09/21 63 kg  07/17/21 59.9 kg     Unresulted Labs (From admission, onward)     Start     Ordered   02/03/22 0500  Creatinine, serum  (enoxaparin (LOVENOX)  CrCl >/= 30 mL/min  )  Weekly,   R     Comments: while on enoxaparin therapy.    01/27/22 1716   01/31/22 5643  Basic metabolic panel  Daily,   R      01/30/22 0933   01/31/22 0500  CBC  Daily,   R      01/30/22 0933          Data Reviewed: I have personally reviewed following labs and imaging studies CBC: Recent Labs  Lab 01/26/22 0322 01/27/22 1826 01/28/22 0309 01/29/22 0228 01/30/22 0239  WBC 8.5 14.2* 11.4* 19.1* 17.7*  NEUTROABS 5.2  --   --   --   --   HGB 12.1 12.9 11.7* 11.1* 11.9*  HCT 36.8 36.5 33.6* 33.6* 37.0  MCV 99.5 95.5 97.1 99.1 101.9*  PLT 281 296 168 246 329   Basic Metabolic Panel: Recent Labs  Lab 01/26/22 0322 01/27/22 1826 01/28/22 0309 01/29/22 0228 01/30/22 0239  NA 133*  --  136 135 135  K 3.7  --  4.3 3.4* 4.2  CL 98  --  97* 96* 97*  CO2 28  --  '26 25 26  '$ GLUCOSE 129*  --  112* 111* 77  BUN 6*  --  6* <5* 6*  CREATININE 0.53 0.56 0.58 0.63 0.56  CALCIUM 9.4  --  8.9 9.0 8.7*  Coagulation Profile: Recent Labs  Lab 01/26/22 0322  INR 0.9   Recent Results (from the past 240 hour(s))  Surgical pcr screen     Status: None   Collection Time: 01/27/22  6:36 AM   Specimen: Nasal Mucosa; Nasal Swab   Result Value Ref Range Status   MRSA, PCR NEGATIVE NEGATIVE Final   Staphylococcus aureus NEGATIVE NEGATIVE Final    Comment: (NOTE) The Xpert SA Assay (FDA approved for NASAL specimens in patients 81 years of age and older), is one component of a comprehensive surveillance program. It is not intended to diagnose infection nor to guide or monitor treatment. Performed at [facility], [place] [address] [place], [place] [address]     Antimicrobials: Anti-infectives (From  admission, onward)    Start     Dose/Rate Route Frequency Ordered Stop   01/29/22 1400  cefTRIAXone (ROCEPHIN) 2 g in sodium chloride 0.9 % 100 mL IVPB        2 g 200 mL/hr over 30 Minutes Intravenous Every 24 hours 01/29/22 1303 02/03/22 1359   01/29/22 1400  azithromycin (ZITHROMAX) tablet 500 mg        500 mg Oral Daily 01/29/22 1303 02/03/22 0959   01/27/22 2000  ceFAZolin (ANCEF) IVPB 2g/100 mL premix        2 g 200 mL/hr over 30 Minutes Intravenous Every 6 hours 01/27/22 1716 01/28/22 0931   01/27/22 1439  vancomycin (VANCOCIN) powder  Status:  Discontinued          As needed 01/27/22 1440 01/27/22 1533   01/27/22 1200  ceFAZolin (ANCEF) IVPB 2g/100 mL premix        2 g 200 mL/hr over 30 Minutes Intravenous To Surgery 01/26/22 1558 01/27/22 1401   01/27/22 0730  ceFAZolin (ANCEF) IVPB 2g/100 mL premix  Status:  Discontinued        2 g 200 mL/hr over 30 Minutes Intravenous On call to O.R. 01/27/22 0631 01/27/22 1105      Culture/Microbiology No results found for: "SDES", "SPECREQUEST", "CULT", "REPTSTATUS"   Radiology Studies: DG Abd 1 View  Result Date: 01/30/2022 CLINICAL DATA:  Constipation. EXAM: ABDOMEN - 1 VIEW COMPARISON:  None Available. FINDINGS: Large stool burden is seen throughout the colon. No dilated small bowel loops are seen. Right quadrant surgical clips are consistent with prior cholecystectomy. Left hip prosthesis also noted IMPRESSION: Large colonic stool burden, consistent with  constipation. Electronically Signed   By: Marlaine Hind M.D.   On: 01/30/2022 09:03   DG Chest 2 View  Result Date: 01/29/2022 CLINICAL DATA:  PNA (pneumonia) EXAM: CHEST - 2 VIEW COMPARISON:  Chest x-ray 01/26/2022. FINDINGS: Low lung volumes with mild left basilar opacities. No visible pleural effusions or pneumothorax. Cardiomediastinal silhouette is probably within normal limits for technique. IMPRESSION: Low lung volumes with mild left basilar opacities, which could represent atelectasis, aspiration, and/or pneumonia. Electronically Signed   By: Margaretha Sheffield M.D.   On: 01/29/2022 09:51     LOS: 4 days   Antonieta Pert, MD [facility]  01/30/2022, 12:05 PM

## 2022-01-30 NOTE — Progress Notes (Signed)
Occupational Therapy Treatment Patient Details Name: Sandra Riley MRN: [id number] DOB: [birth year] Today's Date: 01/30/2022   History of present illness Pt is a 61 y/o female admitted s/p fall at home resulting in minimally displaced L femoral neck fx. Pt underwent L THA with anterior approach on 12/27. PMH: GAD, seizures, depression, HTN   OT comments  Pt. Seen for skilled OT treatment session. Pt. Completed bed mobility with min guard a.  Mod a for short distance transfer with pivot to recliner.  Max cues for sequencing and safety.  Pt. Attempting to sit prior to being fully at the chair even with cues and alerting.  Cont. To progress adls/mobility next session.  Agree with current d/c recommendations.     Recommendations for follow up therapy are one component of a multi-disciplinary discharge planning process, led by the attending physician.  Recommendations may be updated based on patient status, additional functional criteria and insurance authorization.    Follow Up Recommendations  Skilled nursing-short term rehab (<3 hours/day)     Assistance Recommended at Discharge Frequent or constant Supervision/Assistance  Patient can return home with the following  A lot of help with walking and/or transfers;A lot of help with bathing/dressing/bathroom   Equipment Recommendations  BSC/3in1;Other (comment)    Recommendations for Other Services      Precautions / Restrictions Precautions Precautions: Fall Restrictions LLE Weight Bearing: Weight bearing as tolerated       Mobility Bed Mobility Overal bed mobility: Needs Assistance Bed Mobility: Supine to Sit     Supine to sit: Min guard, HOB elevated     General bed mobility comments: instructed pt. on use of BUEs to aide in guiding BLES toward and off of the bed. pt. used bues to guide LLE off of bed and RLE followed along behind.  no physical assistance mostly cues for pt. guiding her mobility towards eob.     Transfers Overall transfer level: Needs assistance Equipment used: Rolling walker (2 wheels) Transfers: Sit to/from Stand, Bed to chair/wheelchair/BSC Sit to Stand: Min assist     Step pivot transfers: Mod assist     General transfer comment: cues for sequencing, safety, hand placement. pt. attempting to sit without being fully at the chair. also multiple cues to reach for arm rests and pt. cont. to attempt sitting without reaching back.  tactile guidance to utilize arm rests     Balance                                           ADL either performed or assessed with clinical judgement   ADL Overall ADL's : Needs assistance/impaired                         Toilet Transfer: Moderate assistance;Stand-pivot;Rolling walker (2 wheels) Toilet Transfer Details (indicate cue type and reason): simulated during transfer to recliner-cues for sequencing steps, assist to safely descend to recliner due to attempts to premature sit despite cues including tactile guidance for using arm rests prior to sitting         Functional mobility during ADLs: Moderate assistance      Extremity/Trunk Assessment              Vision       Perception     Praxis      Cognition Arousal/Alertness: Awake/alert Behavior During Therapy: [facility]  for tasks assessed/performed, Flat affect Overall Cognitive Status: Within Functional Limits for tasks assessed                                          Exercises      Shoulder Instructions       General Comments      Pertinent Vitals/ Pain       Pain Assessment Pain Assessment: No/denies pain  Home Living                                          Prior Functioning/Environment              Frequency  Min 2X/week        Progress Toward Goals  OT Goals(current goals can now be found in the care plan section)  Progress towards OT goals: Progressing toward goals     Plan       Co-evaluation                 AM-PAC OT "6 Clicks" Daily Activity     Outcome Measure   Help from another person eating meals?: None Help from another person taking care of personal grooming?: A Little Help from another person toileting, which includes using toliet, bedpan, or urinal?: A Lot Help from another person bathing (including washing, rinsing, drying)?: A Lot Help from another person to put on and taking off regular upper body clothing?: A Little Help from another person to put on and taking off regular lower body clothing?: A Lot 6 Click Score: 16    End of Session Equipment Utilized During Treatment: Rolling walker (2 wheels)  OT Visit Diagnosis: Unsteadiness on feet (R26.81);Other abnormalities of gait and mobility (R26.89);Muscle weakness (generalized) (M62.81)   Activity Tolerance Patient tolerated treatment well   Patient Left in chair;with call bell/phone within reach;with chair alarm set   Nurse Communication Other (comment) (rn present towards end of session to give BP medication to pt.)        Time: 1013-1027 OT Time Calculation (min): 14 min  Charges: OT General Charges $OT Visit: 1 Visit OT Treatments $Self Care/Home Management : 8-22 mins  Sonia Baller, COTA/L Acute Rehabilitation [phone]   Clearnce Sorrel Lorraine-COTA/L 01/30/2022, 12:49 PM

## 2022-01-30 NOTE — Plan of Care (Signed)

## 2022-01-31 DIAGNOSIS — S72042A Displaced fracture of base of neck of left femur, initial encounter for closed fracture: Secondary | ICD-10-CM | POA: Diagnosis not present

## 2022-01-31 LAB — BASIC METABOLIC PANEL
Anion gap: 11 (ref 5–15)
BUN: <5 mg/dL — ABNORMAL LOW (ref 8–23)
CO2: 27 mmol/L (ref 22–32)
Calcium: 8.6 mg/dL — ABNORMAL LOW (ref 8.9–10.3)
Chloride: 93 mmol/L — ABNORMAL LOW (ref 98–111)
Creatinine, Ser: 0.53 mg/dL (ref 0.44–1.00)
GFR, Estimated: >60 mL/min (ref >60)
Glucose, Bld: 107 mg/dL — ABNORMAL HIGH (ref 70–99)
Potassium: 3.4 mmol/L — ABNORMAL LOW (ref 3.5–5.1)
Sodium: 131 mmol/L — ABNORMAL LOW (ref 135–145)

## 2022-01-31 LAB — CBC
HCT: 29.3 % — ABNORMAL LOW (ref 36.0–46.0)
Hemoglobin: 9.6 g/dL — ABNORMAL LOW (ref 12.0–15.0)
MCH: 32.9 pg (ref 26.0–34.0)
MCHC: 32.8 g/dL (ref 30.0–36.0)
MCV: 100.3 fL — ABNORMAL HIGH (ref 80.0–100.0)
Platelets: 273 10*3/uL (ref 150–400)
RBC: 2.92 MIL/uL — ABNORMAL LOW (ref 3.87–5.11)
RDW: 12 % (ref 11.5–15.5)
WBC: 14.3 10*3/uL — ABNORMAL HIGH (ref 4.0–10.5)
nRBC: 0 % (ref 0.0–0.2)

## 2022-01-31 MED ORDER — POTASSIUM CHLORIDE CRYS ER 20 MEQ PO TBCR
20.0000 meq | EXTENDED_RELEASE_TABLET | Freq: Once | ORAL | Status: AC
  Administered 2022-01-31: 20 meq via ORAL
  Filled 2022-01-31: qty 1

## 2022-01-31 NOTE — Progress Notes (Signed)
Patient complaining of urine retention, bladder scan done this morning, bladder scan volume- 545 ml. Intermittent catheterization done and removed 750 Ml of urine with post cath residual 8 ml.

## 2022-01-31 NOTE — Progress Notes (Signed)
PROGRESS NOTE Sandra Riley  UKG:[id number] DOB: 1960/04/04 DOA: 01/26/2022 PCP: Fayrene Helper, MD   Brief Narrative/Hospital Course: 61 y.o. female with medical history significant of anxiety and depression, epilepsy, hypertension, and more presents ED with a chief complaint of leg pain.  Patient reports that she got up in the night to go to the bathroom when she had a slip and fall in the hallway. No head trauma or LOC. Patient does have a seizure disorder. She cannot remember when the last time she had a seizure was. She reports she usually does feel an aura prior to a seizure. 12/26: XR in ED (+)Acute minimally displaced left femoral neck fracture ortho recommends transfer to [facility]. 12/27 arrived at [facility] and underwent left hip fracture surgical repair 12/27. Mobilizing with PT OT, continue for suspected pneumonia. Overall doing well awaiting for skilled nursing facility placement.   Subjective: Seen and examined. She is having urine retention needing in and out cath.  Labs reviewed mild hypokalemia, WBC count downtrending low-grade fever overnight  Assessment and Plan: Principal Problem:   Displaced fracture of base of neck of left femur, initial encounter for closed fracture [facility]) Active Problems:   Moderate mood disorder (HCC)   Essential hypertension   Generalized convulsive epilepsy ([place])   Left femoral neck fracture: Secondary to fall status post THA 12/27 .surgically stable, continue pain control, DVT prophylaxis with Lovenox, cont ptot and snf palcement.  Suspected pneumonia with cough chest x-ray positive and with leukocytosis.  Continue ceftriaxone/azithromycin.  WBC count slowly downtrending.  Respiratory status is stable. 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 Pt unaware.X-ray was done,shows large colonic stool burden, continue bowel regimen evaluation. Urine retention:continue Flomax and out cath and likely need catheterization.  DVT prophylaxis: enoxaparin (LOVENOX) injection 40 mg Start: 01/28/22 0800 SCDs Start: 01/27/22 1717 Place TED hose Start: 01/27/22 1717 SCDs Start: 01/26/22 0547 Code Status:   Code Status: Full Code Family Communication: plan of care discussed with patient/ at bedside. Patient status is: INPATIENT because of HIP FRACTURE  Level of care: Med-Surg  Dispo:The patient is from: home            Anticipated disposition: SNF.  Objective: Vitals last 24 hrs: Vitals:   01/29/22 1300 01/29/22 2100 01/30/22 0955 01/30/22 1943  BP: 133/72 (!) 127/45 (!) 125/51 138/72  Pulse: (!) 110 (!) 104 100 (!) 107  Resp: '18 18 20 18  '$ Temp: 98 F (36.7 C) 99.5 F (37.5 C) 98.5 F (36.9 C) 100.1 F (37.8 C)  TempSrc:  Oral Oral Oral  SpO2: 99% 99% 99% 98%  Weight:      Height:       Weight change:   Physical Examination: General exam: AAox3,weak,older appearing HEENT:Oral mucosa moist, Ear/Nose WNL grossly, dentition normal. Respiratory system: bilaterally clear BS,no use of accessory muscle Cardiovascular system: S1 & S2 +, regular rate, JVD neg. Gastrointestinal system: Abdomen soft,NT,ND,BS+. Left hip incision site w/ aquacel dressing in place. Nervous System:Alert, awake, moving extremities and grossly nonfocal Extremities: LE ankle edema neg, lower extremities warm Skin: No rashes,no icterus. MSK: Normal muscle bulk,tone, power   Medications reviewed:  Scheduled Meds:  amLODipine  5 mg Oral Daily  azithromycin  500 mg Oral Daily   docusate sodium  100 mg Oral BID   enoxaparin (LOVENOX) injection  40 mg Subcutaneous Q24H   levETIRAcetam  1,000 mg  Oral BID   mirtazapine  45 mg Oral QHS   PHENobarbital  97.2 mg Oral 2 times per day on Mon Tue Wed Thu Fri   phenobarbital  97.2 mg Oral Once per day on Sun Sat   spironolactone  50 mg Oral Daily   tamsulosin  0.4 mg Oral QPC supper   Continuous Infusions:  cefTRIAXone (ROCEPHIN)  IV 2 g (01/30/22 1302)   methocarbamol (ROBAXIN) IV      Diet Order             Diet Carb Modified Fluid consistency: Thin; Room service appropriate? Yes  Diet effective now                   Intake/Output Summary (Last 24 hours) at 01/31/2022 1052 Last data filed at 01/31/2022 0631 Gross per 24 hour  Intake --  Output 1758 ml  Net -1758 ml    Net IO Since Admission: -5,553.17 mL [01/31/22 1052]  Wt Readings from Last 3 Encounters:  01/27/22 60.8 kg  12/09/21 63 kg  07/17/21 59.9 kg     Unresulted Labs (From admission, onward)     Start     Ordered   02/03/22 0500  Creatinine, serum  (enoxaparin (LOVENOX)  CrCl >/= 30 mL/min  )  Weekly,   R     Comments: while on enoxaparin therapy.    01/27/22 1716   01/31/22 6295  Basic metabolic panel  Daily,   R      01/30/22 0933   01/31/22 0500  CBC  Daily,   R      01/30/22 0933          Data Reviewed: I have personally reviewed following labs and imaging studies CBC: Recent Labs  Lab 01/26/22 0322 01/27/22 1826 01/28/22 0309 01/29/22 0228 01/30/22 0239 01/31/22 0215  WBC 8.5 14.2* 11.4* 19.1* 17.7* 14.3*  NEUTROABS 5.2  --   --   --   --   --   HGB 12.1 12.9 11.7* 11.1* 11.9* 9.6*  HCT 36.8 36.5 33.6* 33.6* 37.0 29.3*  MCV 99.5 95.5 97.1 99.1 101.9* 100.3*  PLT 281 296 168 246 210 284    Basic Metabolic Panel: Recent Labs  Lab 01/26/22 0322 01/27/22 1826 01/28/22 0309 01/29/22 0228 01/30/22 0239 01/31/22 0215  NA 133*  --  136 135 135 131*  K 3.7  --  4.3 3.4* 4.2 3.4*  CL 98  --  97* 96* 97* 93*  CO2 28  --  '26 25 26 27  '$ GLUCOSE 129*  --  112* 111* 77 107*  BUN 6*  --  6* <5* 6* <5*  CREATININE 0.53 0.56 0.58  0.63 0.56 0.53  CALCIUM 9.4  --  8.9 9.0 8.7* 8.6*   Coagulation Profile: Recent Labs  Lab 01/26/22 0322  INR 0.9    Recent Results (from the past 240 hour(s))  Surgical pcr screen     Status: None   Collection Time: 01/27/22  6:36 AM   Specimen: Nasal Mucosa; Nasal Swab  Result Value Ref Range Status   MRSA, PCR NEGATIVE NEGATIVE Final   Staphylococcus aureus NEGATIVE NEGATIVE Final    Comment: (NOTE) The Xpert SA Assay (FDA approved for NASAL specimens in patients 78 years of age and older), is one component of  a comprehensive surveillance program. It is not intended to diagnose infection nor to guide or monitor treatment. Performed at [facility], [place] [address] [place], [place] [address]     Antimicrobials: Anti-infectives (From admission, onward)    Start     Dose/Rate Route Frequency Ordered Stop   01/29/22 1400  cefTRIAXone (ROCEPHIN) 2 g in sodium chloride 0.9 % 100 mL IVPB        2 g 200 mL/hr over 30 Minutes Intravenous Every 24 hours 01/29/22 1303 02/03/22 1359   01/29/22 1400  azithromycin (ZITHROMAX) tablet 500 mg        500 mg Oral Daily 01/29/22 1303 02/03/22 0959   01/27/22 2000  ceFAZolin (ANCEF) IVPB 2g/100 mL premix        2 g 200 mL/hr over 30 Minutes Intravenous Every 6 hours 01/27/22 1716 01/28/22 0931   01/27/22 1439  vancomycin (VANCOCIN) powder  Status:  Discontinued          As needed 01/27/22 1440 01/27/22 1533   01/27/22 1200  ceFAZolin (ANCEF) IVPB 2g/100 mL premix        2 g 200 mL/hr over 30 Minutes Intravenous To Surgery 01/26/22 1558 01/27/22 1401   01/27/22 0730  ceFAZolin (ANCEF) IVPB 2g/100 mL premix  Status:  Discontinued        2 g 200 mL/hr over 30 Minutes Intravenous On call to O.R. 01/27/22 0631 01/27/22 1105     Culture/Microbiology No results found for: "SDES", "SPECREQUEST", "CULT", "REPTSTATUS"  Radiology Studies: DG Abd 1 View  Result Date: 01/30/2022 CLINICAL DATA:  Constipation. EXAM: ABDOMEN - 1 VIEW  COMPARISON:  None Available. FINDINGS: Large stool burden is seen throughout the colon. No dilated small bowel loops are seen. Right quadrant surgical clips are consistent with prior cholecystectomy. Left hip prosthesis also noted IMPRESSION: Large colonic stool burden, consistent with constipation. Electronically Signed   By: Marlaine Hind M.D.   On: 01/30/2022 09:03     LOS: 5 days   Antonieta Pert, MD [facility]  01/31/2022, 10:52 AM

## 2022-01-31 NOTE — Plan of Care (Signed)
°  Problem: Clinical Measurements: °Goal: Will remain free from infection °Outcome: Progressing °  °Problem: Activity: °Goal: Risk for activity intolerance will decrease °Outcome: Progressing °  °Problem: Nutrition: °Goal: Adequate nutrition will be maintained °Outcome: Progressing °  °

## 2022-01-31 NOTE — Plan of Care (Signed)
  Problem: Activity: Goal: Risk for activity intolerance will decrease Outcome: Progressing   Problem: Nutrition: Goal: Adequate nutrition will be maintained Outcome: Progressing   Problem: Elimination: Goal: Will not experience complications related to bowel motility Outcome: Progressing   Problem: Elimination: Goal: Will not experience complications related to urinary retention Outcome: Progressing

## 2022-02-01 DIAGNOSIS — S72042A Displaced fracture of base of neck of left femur, initial encounter for closed fracture: Secondary | ICD-10-CM | POA: Diagnosis not present

## 2022-02-01 LAB — BASIC METABOLIC PANEL
Anion gap: 12 (ref 5–15)
BUN: <5 mg/dL — ABNORMAL LOW (ref 8–23)
CO2: 27 mmol/L (ref 22–32)
Calcium: 8.6 mg/dL — ABNORMAL LOW (ref 8.9–10.3)
Chloride: 93 mmol/L — ABNORMAL LOW (ref 98–111)
Creatinine, Ser: 0.47 mg/dL (ref 0.44–1.00)
GFR, Estimated: >60 mL/min (ref >60)
Glucose, Bld: 111 mg/dL — ABNORMAL HIGH (ref 70–99)
Potassium: 3.6 mmol/L (ref 3.5–5.1)
Sodium: 132 mmol/L — ABNORMAL LOW (ref 135–145)

## 2022-02-01 LAB — CBC
HCT: 27 % — ABNORMAL LOW (ref 36.0–46.0)
Hemoglobin: 9.1 g/dL — ABNORMAL LOW (ref 12.0–15.0)
MCH: 33.2 pg (ref 26.0–34.0)
MCHC: 33.7 g/dL (ref 30.0–36.0)
MCV: 98.5 fL (ref 80.0–100.0)
Platelets: 315 10*3/uL (ref 150–400)
RBC: 2.74 MIL/uL — ABNORMAL LOW (ref 3.87–5.11)
RDW: 11.9 % (ref 11.5–15.5)
WBC: 11.3 10*3/uL — ABNORMAL HIGH (ref 4.0–10.5)
nRBC: 0 % (ref 0.0–0.2)

## 2022-02-01 MED ORDER — CHLORHEXIDINE GLUCONATE CLOTH 2 % EX PADS
6.0000 | MEDICATED_PAD | Freq: Every day | CUTANEOUS | Status: DC
  Administered 2022-02-02: 6 via TOPICAL

## 2022-02-01 NOTE — TOC Progression Note (Signed)
Transition of Care [facility]) - Progression Note    Patient Details  Name: Sandra Riley MRN: [id number] Date of Birth: Feb 22, 1960  Transition of Care [facility]) CM/SW Contact  Joanne Chars, LCSW Phone Number: 02/01/2022, 3:06 PM  Clinical Narrative:   Josem Kaufmann request submitted and approved in [place]: [id number], 3 days: 1/2-1/4.      Expected Discharge Plan: [place] Barriers to Discharge: Continued Medical Work up, SNF Pending bed offer  Expected Discharge Plan and Services In-house Referral: Clinical Social Work   Post Acute Care Choice: [place] Living arrangements for the past 2 months: Apartment                                       Social Determinants of Health (SDOH) Interventions [place]: No Food Insecurity (01/31/2022)  Housing: Low Risk  (01/31/2022)  Transportation Needs: No Transportation Needs (01/31/2022)  Utilities: Not At Risk (01/31/2022)  Alcohol Screen: Low Risk  (01/28/2021)  Depression (PHQ2-9): Low Risk  (12/09/2021)  Financial Resource Strain: Low Risk  (01/28/2021)  Recent Concern: Financial Resource Strain - Medium Risk (11/13/2020)  Physical Activity: Insufficiently Active (11/13/2020)  Social Connections: Moderately Integrated (01/28/2021)  Stress: Stress Concern Present (06/19/2021)  Tobacco Use: Low Risk  (01/29/2022)    Readmission Risk Interventions     No data to display

## 2022-02-01 NOTE — Progress Notes (Signed)
Physical Therapy Treatment Patient Details Name: Sandra Riley MRN: [id number] DOB: 1960-11-30 Today's Date: 02/01/2022   History of Present Illness Pt is a 62 y/o female admitted s/p fall at home resulting in minimally displaced L femoral neck fx. Pt underwent L THA with anterior approach on 12/27. PMH: GAD, seizures, depression, HTN    PT Comments    Pt received in bed, has not been up today. Pt able to come to EOB with increased time and vc's but no physical assist. Pt ambulated 5' fwd and bkwd with RW, then 25' with RW with min A. Needs cues for sequencing and safety. Pt reported mild dizziness when up.  Pt performed standing there ex with RW and min A. SPO2 93%, HR 107 bpm. Continue to rec ST SNF for rehab before home. PT will continue to follow.     Recommendations for follow up therapy are one component of a multi-disciplinary discharge planning process, led by the attending physician.  Recommendations may be updated based on patient status, additional functional criteria and insurance authorization.  Follow Up Recommendations  Skilled nursing-short term rehab (<3 hours/day) Can patient physically be transported by private vehicle: No   Assistance Recommended at Discharge Frequent or constant Supervision/Assistance  Patient can return home with the following A little help with walking and/or transfers;Assistance with cooking/housework;Help with stairs or ramp for entrance   Equipment Recommendations  Rolling walker (2 wheels)    Recommendations for Other Services       Precautions / Restrictions Precautions Precautions: Fall Restrictions Weight Bearing Restrictions: Yes LLE Weight Bearing: Weight bearing as tolerated     Mobility  Bed Mobility Overal bed mobility: Needs Assistance Bed Mobility: Supine to Sit     Supine to sit: Min guard, HOB elevated     General bed mobility comments: pt able to come to EOB without physical assist but needs vc's to initiate and  sequence    Transfers Overall transfer level: Needs assistance Equipment used: Rolling walker (2 wheels) Transfers: Sit to/from Stand Sit to Stand: Min assist           General transfer comment: vc's for hand placement, min A to steady. Performed multiple times from bed and chair    Ambulation/Gait Ambulation/Gait assistance: Min assist Gait Distance (Feet): 35 Feet (10', 25') Assistive device: Rolling walker (2 wheels) Gait Pattern/deviations: Step-to pattern, Antalgic, Decreased step length - left, Decreased step length - right, Decreased stance time - left Gait velocity: decreased Gait velocity interpretation: <1.31 ft/sec, indicative of household ambulator   General Gait Details: worked on bkwd walking and well as making turns. Pt initially had some R knee buckle but did not continue once she got going, vc's for sequencing and safety   Stairs             Wheelchair Mobility    Modified Rankin (Stroke Patients Only)       Balance Overall balance assessment: Needs assistance Sitting-balance support: No upper extremity supported, Feet supported Sitting balance-Leahy Scale: Good     Standing balance support: Bilateral upper extremity supported, During functional activity, Reliant on assistive device for balance Standing balance-Leahy Scale: Poor                              Cognition Arousal/Alertness: Awake/alert Behavior During Therapy: WFL for tasks assessed/performed, Flat affect Overall Cognitive Status: Within Functional Limits for tasks assessed  General Comments: pleasant, does require consistent safety/sequencing and problem solving cues once up.        Exercises Total Joint Exercises Ankle Circles/Pumps: AROM, Both, 10 reps, Supine Marching in Standing: AROM, Left, 10 reps, Standing Standing Hip Extension: AROM, Left, 10 reps, Standing    General Comments General comments (skin  integrity, edema, etc.): husband and son present. She feels she may be having some seizures at night. HR 107 bpm, SPO2 93%      Pertinent Vitals/Pain Pain Assessment Pain Assessment: 0-10 Pain Score: 7  Pain Location: L hip Pain Descriptors / Indicators: Throbbing Pain Intervention(s): Limited activity within patient's tolerance, Monitored during session    Home Living                          Prior Function            PT Goals (current goals can now be found in the care plan section) Acute Rehab PT Goals Patient Stated Goal: To get stronger PT Goal Formulation: With patient Time For Goal Achievement: 02/11/22 Potential to Achieve Goals: Fair Progress towards PT goals: Progressing toward goals    Frequency    Min 3X/week      PT Plan Current plan remains appropriate    Co-evaluation              AM-PAC PT "6 Clicks" Mobility   Outcome Measure  Help needed turning from your back to your side while in a flat bed without using bedrails?: A Little Help needed moving from lying on your back to sitting on the side of a flat bed without using bedrails?: A Little Help needed moving to and from a bed to a chair (including a wheelchair)?: A Little Help needed standing up from a chair using your arms (e.g., wheelchair or bedside chair)?: A Little Help needed to walk in hospital room?: A Little Help needed climbing 3-5 steps with a railing? : A Lot 6 Click Score: 17    End of Session Equipment Utilized During Treatment: Gait belt Activity Tolerance: Patient tolerated treatment well Patient left: with call bell/phone within reach;in chair;with family/visitor present Nurse Communication: Mobility status PT Visit Diagnosis: Unsteadiness on feet (R26.81);Other abnormalities of gait and mobility (R26.89);Muscle weakness (generalized) (M62.81)     Time: 1411-1440 PT Time Calculation (min) (ACUTE ONLY): 29 min  Charges:  $Gait Training: 8-22  mins $Therapeutic Exercise: 8-22 mins                     Leighton Roach, PT  Acute Rehab Services Secure chat preferred Office [place] 02/01/2022, 2:55 PM

## 2022-02-01 NOTE — Progress Notes (Signed)
PROGRESS NOTE Kashia Brossard  VWU:[id number] DOB: 04-07-1960 DOA: 01/26/2022 PCP: Fayrene Helper, MD   Brief Narrative/Hospital Course: 62 y.o. female with medical history significant of anxiety and depression, epilepsy, hypertension, and more presents ED with a chief complaint of leg pain.  Patient reports that she got up in the night to go to the bathroom when she had a slip and fall in the hallway. No head trauma or LOC. Patient does have a seizure disorder. She cannot remember when the last time she had a seizure was. She reports she usually does feel an aura prior to a seizure. 12/26: XR in ED (+)Acute minimally displaced left femoral neck fracture ortho recommends transfer to [facility]. 12/27 arrived at [facility] and underwent left hip fracture surgical repair 12/27. Mobilizing with PT OT, continue for suspected pneumonia. Overall doing well awaiting for skilled nursing facility placement.   Subjective: Seen and examined this morning.  Resting comfortably has no complaint.  Has a Foley catheter in place having good output leukocytosis resolving  Assessment and Plan: Principal Problem:   Displaced fracture of base of neck of left femur, initial encounter for closed fracture [facility]) Active Problems:   Moderate mood disorder (HCC)   Essential hypertension   Generalized convulsive epilepsy ([place])   Left femoral neck fracture: Secondary to fall status post THA 12/27 .surgically stable, continue pain control, DVT prophylaxis with Lovenox, cont ptot and snf palcement.  Suspected pneumonia with cough chest x-ray positive and with leukocytosis.  Continue ceftriaxone/azithromycin.  Leukocytosis resolving respiratory status stable. Recent Labs  Lab 01/28/22 0309 01/29/22 0228 01/30/22 0239 01/31/22 0215 02/01/22 0238  WBC 11.4* 19.1* 17.7* 14.3* 11.3*   Mood disorder on Remeron Klonopin PRN continue same Essential hypertension BP stable on Norvasc and Aldactone Generalized convulsive  epilepsy continue her phenobarbital, Keppra Hypokalemia replete Hyponatremia:Resolved Constipation: LLQ mass overnight on Rn exam. Pt unaware.X-ray was done,shows large colonic stool burden, continue bowel regimen evaluation. Urine retention:continue Flomax, foley palced 12/31> continue for at least 1 week before doing voiding trial  DVT prophylaxis: enoxaparin (LOVENOX) injection 40 mg Start: 01/28/22 0800 SCDs Start: 01/27/22 1717 Place TED hose Start: 01/27/22 1717 SCDs Start: 01/26/22 0547 Code Status:   Code Status: Full Code Family Communication: plan of care discussed with patient/ at bedside. Patient status is: INPATIENT because of HIP FRACTURE  Level of care: Med-Surg  Dispo:The patient is from: home            Anticipated disposition: SNF.  Objective: Vitals last 24 hrs: Vitals:   01/30/22 0955 01/30/22 1943 01/31/22 2000 02/01/22 0809  BP: (!) 125/51 138/72 127/66 117/66  Pulse: 100 (!) 107 (!) 102 97  Resp: '20 18 18 16  '$ Temp: 98.5 F (36.9 C) 100.1 F (37.8 C) 99.4 F (37.4 C) 99.9 F (37.7 C)  TempSrc: Oral Oral Oral Oral  SpO2: 99% 98% 95% 94%  Weight:      Height:       Weight change:   Physical Examination: General exam: AA, weak,older appearing HEENT:Oral mucosa moist, Ear/Nose WNL grossly, dentition normal. Respiratory system: bilaterally diminished BS,no use of accessory muscle Cardiovascular system: S1 & S2 +, regular rate. Gastrointestinal system: Abdomen soft, NT,ND,BS+ Nervous System:Alert, awake, moving extremities and grossly nonfocal Extremities: LE ankle edema neg, left femur with surgical site Aquacel dressing,lower extremities warm Skin:No rashes,no icterus. GNF:AOZHYQ muscle bulk,tone, power   Medications reviewed:  Scheduled Meds:  amLODipine  5 mg Oral Daily   azithromycin  500 mg Oral Daily  docusate sodium  100 mg Oral BID   enoxaparin (LOVENOX) injection  40 mg Subcutaneous Q24H   levETIRAcetam  1,000 mg Oral BID   mirtazapine   45 mg Oral QHS   PHENobarbital  97.2 mg Oral 2 times per day on Mon Tue Wed Thu Fri   phenobarbital  97.2 mg Oral Once per day on Sun Sat   spironolactone  50 mg Oral Daily   tamsulosin  0.4 mg Oral QPC supper   Continuous Infusions:  cefTRIAXone (ROCEPHIN)  IV 2 g (01/31/22 1426)   methocarbamol (ROBAXIN) IV      Diet Order             Diet Carb Modified Fluid consistency: Thin; Room service appropriate? Yes  Diet effective now                   Intake/Output Summary (Last 24 hours) at 02/01/2022 1111 Last data filed at 01/31/2022 2000 Gross per 24 hour  Intake 200 ml  Output 3200 ml  Net -3000 ml    Net IO Since Admission: -8,553.17 mL [02/01/22 1111]  Wt Readings from Last 3 Encounters:  01/27/22 60.8 kg  12/09/21 63 kg  07/17/21 59.9 kg     Unresulted Labs (From admission, onward)     Start     Ordered   02/03/22 0500  Creatinine, serum  (enoxaparin (LOVENOX)  CrCl >/= 30 mL/min  )  Weekly,   R     Comments: while on enoxaparin therapy.    01/27/22 1716   01/31/22 4132  Basic metabolic panel  Daily,   R      01/30/22 0933   01/31/22 0500  CBC  Daily,   R      01/30/22 0933          Data Reviewed: I have personally reviewed following labs and imaging studies CBC: Recent Labs  Lab 01/26/22 0322 01/27/22 1826 01/28/22 0309 01/29/22 0228 01/30/22 0239 01/31/22 0215 02/01/22 0238  WBC 8.5   < > 11.4* 19.1* 17.7* 14.3* 11.3*  NEUTROABS 5.2  --   --   --   --   --   --   HGB 12.1   < > 11.7* 11.1* 11.9* 9.6* 9.1*  HCT 36.8   < > 33.6* 33.6* 37.0 29.3* 27.0*  MCV 99.5   < > 97.1 99.1 101.9* 100.3* 98.5  PLT 281   < > 168 246 210 273 315   < > = values in this interval not displayed.    Basic Metabolic Panel: Recent Labs  Lab 01/28/22 0309 01/29/22 0228 01/30/22 0239 01/31/22 0215 02/01/22 0238  NA 136 135 135 131* 132*  K 4.3 3.4* 4.2 3.4* 3.6  CL 97* 96* 97* 93* 93*  CO2 '26 25 26 27 27  '$ GLUCOSE 112* 111* 77 107* 111*  BUN 6* <5* 6* <5*  <5*  CREATININE 0.58 0.63 0.56 0.53 0.47  CALCIUM 8.9 9.0 8.7* 8.6* 8.6*   Coagulation Profile: Recent Labs  Lab 01/26/22 0322  INR 0.9    Recent Results (from the past 240 hour(s))  Surgical pcr screen     Status: None   Collection Time: 01/27/22  6:36 AM   Specimen: Nasal Mucosa; Nasal Swab  Result Value Ref Range Status   MRSA, PCR NEGATIVE NEGATIVE Final   Staphylococcus aureus NEGATIVE NEGATIVE Final    Comment: (NOTE) The Xpert SA Assay (FDA approved for NASAL specimens in patients 68 years of age and  older), is one component of a comprehensive surveillance program. It is not intended to diagnose infection nor to guide or monitor treatment. Performed at [facility], [place] [address], [place], [place] [address]     Antimicrobials: Anti-infectives (From admission, onward)    Start     Dose/Rate Route Frequency Ordered Stop   01/29/22 1400  cefTRIAXone (ROCEPHIN) 2 g in sodium chloride 0.9 % 100 mL IVPB        2 g 200 mL/hr over 30 Minutes Intravenous Every 24 hours 01/29/22 1303 02/03/22 1359   01/29/22 1400  azithromycin (ZITHROMAX) tablet 500 mg        500 mg Oral Daily 01/29/22 1303 02/03/22 0959   01/27/22 2000  ceFAZolin (ANCEF) IVPB 2g/100 mL premix        2 g 200 mL/hr over 30 Minutes Intravenous Every 6 hours 01/27/22 1716 01/28/22 0931   01/27/22 1439  vancomycin (VANCOCIN) powder  Status:  Discontinued          As needed 01/27/22 1440 01/27/22 1533   01/27/22 1200  ceFAZolin (ANCEF) IVPB 2g/100 mL premix        2 g 200 mL/hr over 30 Minutes Intravenous To Surgery 01/26/22 1558 01/27/22 1401   01/27/22 0730  ceFAZolin (ANCEF) IVPB 2g/100 mL premix  Status:  Discontinued        2 g 200 mL/hr over 30 Minutes Intravenous On call to O.R. 01/27/22 0631 01/27/22 1105     Culture/Microbiology No results found for: "SDES", "SPECREQUEST", "CULT", "REPTSTATUS"  Radiology Studies: No results found.   LOS: 6 days   Antonieta Pert, MD [facility]  02/01/2022, 11:11 AM

## 2022-02-01 NOTE — Progress Notes (Signed)
Mobility Specialist Progress Note   02/01/22 1508  Mobility  Activity Transferred from chair to bed  Level of Assistance Contact guard assist, steadying assist  Assistive Device Front wheel walker  Distance Ambulated (ft) 4 ft  LLE Weight Bearing WBAT  Activity Response Tolerated well  $Mobility charge 1 Mobility   Pre Mobility: 131/59 BP  Received pt in bed c/o being "swimmy headed" and requesting to go back to bed. BP checked and stable. CGA throughout ambulation w/ min cues on sequencing but minA to get LLE back in bed. Left supine w/ call bell in reach and bed alarm on.   Pt also expressing that they felt like they had a seizure in there sleep last night but haven't told anyone. Relayed concern to RN.    [place] Falling Mobility Specialist Please contact via SecureChat or  Rehab office at [phone]

## 2022-02-02 ENCOUNTER — Telehealth: Payer: Self-pay | Admitting: Family Medicine

## 2022-02-02 DIAGNOSIS — S72042D Displaced fracture of base of neck of left femur, subsequent encounter for closed fracture with routine healing: Secondary | ICD-10-CM | POA: Diagnosis not present

## 2022-02-02 DIAGNOSIS — F32A Depression, unspecified: Secondary | ICD-10-CM | POA: Diagnosis not present

## 2022-02-02 DIAGNOSIS — G40909 Epilepsy, unspecified, not intractable, without status epilepticus: Secondary | ICD-10-CM | POA: Diagnosis not present

## 2022-02-02 DIAGNOSIS — Z743 Need for continuous supervision: Secondary | ICD-10-CM | POA: Diagnosis not present

## 2022-02-02 DIAGNOSIS — I10 Essential (primary) hypertension: Secondary | ICD-10-CM | POA: Diagnosis not present

## 2022-02-02 DIAGNOSIS — S72042A Displaced fracture of base of neck of left femur, initial encounter for closed fracture: Secondary | ICD-10-CM | POA: Diagnosis not present

## 2022-02-02 DIAGNOSIS — R2689 Other abnormalities of gait and mobility: Secondary | ICD-10-CM | POA: Diagnosis not present

## 2022-02-02 DIAGNOSIS — M6281 Muscle weakness (generalized): Secondary | ICD-10-CM | POA: Diagnosis not present

## 2022-02-02 DIAGNOSIS — Z96642 Presence of left artificial hip joint: Secondary | ICD-10-CM | POA: Diagnosis not present

## 2022-02-02 DIAGNOSIS — M80852A Other osteoporosis with current pathological fracture, left femur, initial encounter for fracture: Secondary | ICD-10-CM | POA: Diagnosis not present

## 2022-02-02 DIAGNOSIS — G8929 Other chronic pain: Secondary | ICD-10-CM | POA: Diagnosis not present

## 2022-02-02 DIAGNOSIS — R799 Abnormal finding of blood chemistry, unspecified: Secondary | ICD-10-CM | POA: Diagnosis not present

## 2022-02-02 DIAGNOSIS — R531 Weakness: Secondary | ICD-10-CM | POA: Diagnosis not present

## 2022-02-02 DIAGNOSIS — Z9181 History of falling: Secondary | ICD-10-CM | POA: Diagnosis not present

## 2022-02-02 DIAGNOSIS — E559 Vitamin D deficiency, unspecified: Secondary | ICD-10-CM | POA: Diagnosis not present

## 2022-02-02 DIAGNOSIS — Z5181 Encounter for therapeutic drug level monitoring: Secondary | ICD-10-CM | POA: Diagnosis not present

## 2022-02-02 LAB — CBC
HCT: 27.9 % — ABNORMAL LOW (ref 36.0–46.0)
Hemoglobin: 9.5 g/dL — ABNORMAL LOW (ref 12.0–15.0)
MCH: 33.6 pg (ref 26.0–34.0)
MCHC: 34.1 g/dL (ref 30.0–36.0)
MCV: 98.6 fL (ref 80.0–100.0)
Platelets: 396 10*3/uL (ref 150–400)
RBC: 2.83 MIL/uL — ABNORMAL LOW (ref 3.87–5.11)
RDW: 11.7 % (ref 11.5–15.5)
WBC: 12 10*3/uL — ABNORMAL HIGH (ref 4.0–10.5)
nRBC: 0 % (ref 0.0–0.2)

## 2022-02-02 LAB — BASIC METABOLIC PANEL
Anion gap: 10 (ref 5–15)
BUN: <5 mg/dL — ABNORMAL LOW (ref 8–23)
CO2: 29 mmol/L (ref 22–32)
Calcium: 9.1 mg/dL (ref 8.9–10.3)
Chloride: 94 mmol/L — ABNORMAL LOW (ref 98–111)
Creatinine, Ser: 0.46 mg/dL (ref 0.44–1.00)
GFR, Estimated: >60 mL/min (ref >60)
Glucose, Bld: 109 mg/dL — ABNORMAL HIGH (ref 70–99)
Potassium: 4.1 mmol/L (ref 3.5–5.1)
Sodium: 133 mmol/L — ABNORMAL LOW (ref 135–145)

## 2022-02-02 MED ORDER — PHENOBARBITAL 100 MG PO TABS: ORAL_TABLET | ORAL | 0 refills | Status: DC

## 2022-02-02 MED ORDER — SORBITOL 70 % SOLN: 30.0000 mL | Freq: Every day | Status: DC | PRN

## 2022-02-02 MED ORDER — POLYETHYLENE GLYCOL 3350 17 G PO PACK: 17.0000 g | PACK | Freq: Every day | ORAL | 0 refills | Status: DC | PRN

## 2022-02-02 MED ORDER — TAMSULOSIN HCL 0.4 MG PO CAPS: 0.4000 mg | ORAL_CAPSULE | Freq: Every day | ORAL | Status: DC

## 2022-02-02 MED ORDER — LEVETIRACETAM 1000 MG PO TABS: 1000.0000 mg | ORAL_TABLET | Freq: Two times a day (BID) | ORAL | Status: DC

## 2022-02-02 MED ORDER — CLONAZEPAM 1 MG PO TABS: 1.0000 mg | ORAL_TABLET | Freq: Two times a day (BID) | ORAL | 0 refills | Status: DC | PRN

## 2022-02-02 NOTE — Discharge Summary (Signed)
Physician Discharge Summary   Sandra Riley LZJ:[id number] DOB: [birth year] DOA: 01/26/2022  PCP: Fayrene Helper, MD  Admit date: 01/26/2022 Discharge date: 02/02/2022  Barriers to discharge: none  Admitted From: Home  Disposition:  SNF Discharging physician: Dwyane Dee, MD  Recommendations for Outpatient Follow-up:  Follow up with orthopedic surgery  Trial of void after approx 1 week  Home Health:  Equipment/Devices:   Discharge Condition: stable CODE STATUS: Full Diet recommendation:  Diet Orders (From admission, onward)     Start     Ordered   02/02/22 0000  Diet general        02/02/22 1154   01/27/22 1717  Diet Carb Modified Fluid consistency: Thin; Room service appropriate? Yes  Diet effective now       Question Answer Comment  Calorie Level Medium 1600-2000   Fluid consistency: Thin   Room service appropriate? Yes      01/27/22 1716            Hospital Course: Ms. Lemon is a 62 y.o. female with medical history significant of anxiety and depression, epilepsy, hypertension who presented with left leg pain s/p fall.  Patient reported that she got up in the night to go to the bathroom when she had a slip and fall in the hallway. No head trauma or LOC. Patient does have a seizure disorder. She cannot remember when the last time she had a seizure was. She reports she usually does feel an aura prior to a seizure.  12/26: XR in ED (+)Acute minimally displaced left femoral neck fracture ortho recommends [facility]. 12/27 arrived at [facility] and underwent left hip fracture surgical repair 12/27. Mobilizing with PT OT. She was treated empirically for suspected PNA as well and completed abx inpatient.   Assessment and Plan:  Left femoral neck fracture:  - s/p mechanical fall - underwent THA 12/27 - continue lovenox 40 mg daily x 2 weeks per ortho - follow up with orthopedic surgery in 2 weeks  - PT recommending SNF   Suspected pneumonia with  cough chest x-ray positive and with leukocytosis.  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     Discharge Instructions     Diet general   Complete by: As directed    Increase activity slowly   Complete by: As directed       Allergies as of 02/02/2022       Reactions   Aspirin    Allergic to non coated ASA Caused gastric ulcers   Nsaids Other (See Comments)   Directed by MD to not take.    Singulair [montelukast Sodium] Other (See Comments)   Became sweaty and nuaseated  Medication List     TAKE these medications    acetaminophen 500 MG tablet Commonly known as: TYLENOL Take 1,000 mg by mouth every 6 (six) hours as needed for moderate pain.   alendronate 35 MG tablet Commonly known as: FOSAMAX Take 1 tablet (35 mg total) by mouth every 7 (seven) days. Take with a full glass of water on an empty stomach.   amLODipine 5 MG tablet Commonly known as: NORVASC TAKE ONE TABLET BY MOUTH DAILY   BIOTIN 5000 PO Take 5,000 mcg by mouth once. daily    Calcium Carb-Cholecalciferol 1000-800 MG-UNIT Tabs Take 1 tablet by mouth 3 (three) times daily.   clonazePAM 1 MG tablet Commonly known as: KLONOPIN Take 1 tablet (1 mg total) by mouth 2 (two) times daily as needed for anxiety.   enoxaparin 40 MG/0.4ML injection Commonly known as: LOVENOX Inject 0.4 mLs (40 mg total) into the skin daily for 14 doses.   ferrous sulfate 325 (65 FE) MG tablet Take 325 mg by mouth once. daily   Humidifier Misc Large humidifier.   levETIRAcetam 1000 MG tablet Commonly known as: KEPPRA Take 1 tablet (1,000 mg total) by mouth 2 (two) times daily. What changed:  how much to take when to take this   MAGNESIUM PO Take 500 mg by mouth daily.   mirtazapine 45 MG tablet Commonly known as: REMERON Take 1 tablet (45 mg total) by mouth at bedtime.   multivitamin tablet Take 1 tablet by mouth daily.   oxyCODONE 5 MG immediate release tablet Commonly known as: Oxy IR/ROXICODONE Take 1-2 tablets (5-10 mg total) by mouth every 6 (six) hours as needed for moderate pain (pain score 4-6).   PHENObarbital 100 MG tablet Commonly known as: LUMINAL Take one tablet twice daily Monday through Friday , and take one tablet once daily Saturday and Sunday What changed:  how much to take how to take this when to take this additional instructions   polyethylene glycol 17 g packet Commonly known as: MIRALAX / GLYCOLAX Take 17 g by mouth daily as needed for mild constipation.   Potassium 99 MG Tabs Take 99 mg by mouth daily.   sorbitol 70 % Soln Take 30 mLs by mouth daily as needed for moderate constipation.   spironolactone 50 MG tablet Commonly known as: Aldactone Take 1 tablet (50 mg total) by mouth daily.   tamsulosin 0.4 MG Caps capsule Commonly known as: FLOMAX Take 1 capsule (0.4 mg total) by mouth daily after supper.   UNABLE TO FIND Bed rail x 1 DX: 56.9   UNABLE TO FIND Humidifier DX: 30.1   VITAMIN C PO Take 1 tablet by mouth daily.    Vitamin D 50 MCG (2000 UT) Caps Take 1 capsule by mouth daily.   zinc gluconate 50 MG tablet Take 50 mg by mouth daily.        Follow-up Information     Leandrew Koyanagi, MD Follow up in 2 week(s).   Specialty: Orthopedic Surgery Why: For suture removal, For wound re-check Contact information: [place] [phone]                Allergies  Allergen Reactions   Aspirin     Allergic to non coated ASA Caused gastric ulcers    Nsaids Other (See Comments)    Directed by MD to not take.    Singulair [Montelukast Sodium] Other (See Comments)    Became sweaty and nuaseated    Consultations:  Orthopedic surgery  Procedures: 12/27: total hip replacement, left  Discharge Exam: BP 126/66   Pulse 87   Temp 99.5 F (37.5 C) (Oral)   Resp 19   Ht '5\' 2"'$  (1.575 m)   Wt 60.8 kg   SpO2 95%   BMI 24.51 kg/m  Physical Exam Constitutional:      Appearance: Normal appearance.  HENT:     Head: Normocephalic and atraumatic.     Mouth/Throat:     Mouth: Mucous membranes are moist.  Eyes:     Extraocular Movements: Extraocular movements intact.  Cardiovascular:     Rate and Rhythm: Normal rate and regular rhythm.  Pulmonary:     Effort: Pulmonary effort is normal. No respiratory distress.     Breath sounds: Normal breath sounds. No wheezing or rhonchi.  Abdominal:     General: Bowel sounds are normal. There is no distension.     Palpations: Abdomen is soft.     Tenderness: There is no abdominal tenderness.  Musculoskeletal:     Cervical back: Normal range of motion and neck supple.     Comments: Left hip dressing in place, soft compartments   Skin:    General: Skin is warm and dry.  Neurological:     General: No focal deficit present.     Mental Status: She is alert.  Psychiatric:        Mood and Affect: Mood normal.      The results of significant diagnostics from this hospitalization (including imaging, microbiology,  ancillary and laboratory) are listed below for reference.   Microbiology: Recent Results (from the past 240 hour(s))  Surgical pcr screen     Status: None   Collection Time: 01/27/22  6:36 AM   Specimen: Nasal Mucosa; Nasal Swab  Result Value Ref Range Status   MRSA, PCR NEGATIVE NEGATIVE Final   Staphylococcus aureus NEGATIVE NEGATIVE Final    Comment: (NOTE) The Xpert SA Assay (FDA approved for NASAL specimens in patients 8 years of age and older), is one component of a comprehensive surveillance program. It is not intended to diagnose infection nor to guide or monitor treatment. Performed at [facility], [place] [address], [place],  [address]      Labs: BNP (last 3 results) No results for input(s): "BNP" in the last 8760 hours. Basic Metabolic Panel: Recent Labs  Lab 01/29/22 0228 01/30/22 0239 01/31/22 0215 02/01/22 0238 02/02/22 0344  NA 135 135 131* 132* 133*  K 3.4* 4.2 3.4* 3.6 4.1  CL 96* 97* 93* 93* 94*  CO2 '25 26 27 27 29  '$ GLUCOSE 111* 77 107* 111* 109*  BUN <5* 6* <5* <5* <5*  CREATININE 0.63 0.56 0.53 0.47 0.46  CALCIUM 9.0 8.7* 8.6* 8.6* 9.1   Liver Function Tests: No results for input(s): "AST", "ALT", "ALKPHOS", "BILITOT", "PROT", "ALBUMIN" in the last 168 hours. No results for input(s): "LIPASE", "AMYLASE" in the last 168 hours. No results for input(s): "AMMONIA" in the last 168 hours. CBC: Recent Labs  Lab 01/29/22 0228 01/30/22 0239 01/31/22 0215 02/01/22 0238 02/02/22 0344  WBC 19.1* 17.7* 14.3* 11.3* 12.0*  HGB 11.1* 11.9* 9.6* 9.1* 9.5*  HCT 33.6* 37.0 29.3* 27.0* 27.9*  MCV 99.1 101.9* 100.3* 98.5 98.6  PLT 246 210 273 315 396   Cardiac Enzymes: No results for input(s): "CKTOTAL", "CKMB", "CKMBINDEX", "TROPONINI" in the last 168 hours. BNP: Invalid input(s): "POCBNP" CBG: No results for input(s): "GLUCAP" in the last 168 hours. D-Dimer No results  for input(s): "DDIMER" in the last 72 hours. Hgb A1c No results for  input(s): "HGBA1C" in the last 72 hours. Lipid Profile No results for input(s): "CHOL", "HDL", "LDLCALC", "TRIG", "CHOLHDL", "LDLDIRECT" in the last 72 hours. Thyroid function studies No results for input(s): "TSH", "T4TOTAL", "T3FREE", "THYROIDAB" in the last 72 hours.  Invalid input(s): "FREET3" Anemia work up No results for input(s): "VITAMINB12", "FOLATE", "FERRITIN", "TIBC", "IRON", "RETICCTPCT" in the last 72 hours. Urinalysis    Component Value Date/Time   COLORURINE YELLOW 01/28/2022 1735   APPEARANCEUR CLEAR 01/28/2022 1735   LABSPEC 1.006 01/28/2022 1735   PHURINE 5.0 01/28/2022 1735   GLUCOSEU NEGATIVE 01/28/2022 1735   GLUCOSEU NEG mg/dL 05/08/2008 0309   HGBUR LARGE (A) 01/28/2022 1735   BILIRUBINUR NEGATIVE 01/28/2022 1735   KETONESUR NEGATIVE 01/28/2022 1735   PROTEINUR 30 (A) 01/28/2022 1735   UROBILINOGEN 1 05/08/2008 0309   NITRITE NEGATIVE 01/28/2022 1735   LEUKOCYTESUR SMALL (A) 01/28/2022 1735   Sepsis Labs Recent Labs  Lab 01/30/22 0239 01/31/22 0215 02/01/22 0238 02/02/22 0344  WBC 17.7* 14.3* 11.3* 12.0*   Microbiology Recent Results (from the past 240 hour(s))  Surgical pcr screen     Status: None   Collection Time: 01/27/22  6:36 AM   Specimen: Nasal Mucosa; Nasal Swab  Result Value Ref Range Status   MRSA, PCR NEGATIVE NEGATIVE Final   Staphylococcus aureus NEGATIVE NEGATIVE Final    Comment: (NOTE) The Xpert SA Assay (FDA approved for NASAL specimens in patients 42 years of age and older), is one component of a comprehensive surveillance program. It is not intended to diagnose infection nor to guide or monitor treatment. Performed at [facility], [place] [address] [place],  [address]     Procedures/Studies: DG Abd 1 View  Result Date: 01/30/2022 CLINICAL DATA:  Constipation. EXAM: ABDOMEN - 1 VIEW COMPARISON:  None Available. FINDINGS: Large stool burden is seen throughout the colon. No dilated small bowel loops are  seen. Right quadrant surgical clips are consistent with prior cholecystectomy. Left hip prosthesis also noted IMPRESSION: Large colonic stool burden, consistent with constipation. Electronically Signed   By: Marlaine Hind M.D.   On: 01/30/2022 09:03   DG Chest 2 View  Result Date: 01/29/2022 CLINICAL DATA:  PNA (pneumonia) EXAM: CHEST - 2 VIEW COMPARISON:  Chest x-ray 01/26/2022. FINDINGS: Low lung volumes with mild left basilar opacities. No visible pleural effusions or pneumothorax. Cardiomediastinal silhouette is probably within normal limits for technique. IMPRESSION: Low lung volumes with mild left basilar opacities, which could represent atelectasis, aspiration, and/or pneumonia. Electronically Signed   By: Margaretha Sheffield M.D.   On: 01/29/2022 09:51   DG HIP UNILAT WITH PELVIS 1V LEFT  Result Date: 01/27/2022 CLINICAL DATA:  Left hip fracture.  ORIF EXAM: DG HIP (WITH OR WITHOUT PELVIS) 1V*L* COMPARISON:  01/26/2022 FINDINGS: Six C-arm images were obtained of the left hip. Resection left femoral head. Left hip replacement in satisfactory position alignment. No fracture or complication. IMPRESSION: Satisfactory left hip replacement for fracture left femoral neck. Electronically Signed   By: Franchot Gallo M.D.   On: 01/27/2022 15:22   DG C-Arm 1-60 Min-No Report  Result Date: 01/27/2022 Fluoroscopy was utilized by the requesting physician.  No radiographic interpretation.   DG Chest 1 View  Result Date: 01/26/2022 CLINICAL DATA:  Preop EXAM: CHEST  1 VIEW COMPARISON:  None Available. FINDINGS: Heart size upper limits of normal. No focal consolidation, pleural effusion, or pneumothorax. No acute osseous abnormality.  IMPRESSION: No active disease. Electronically Signed   By: Placido Sou M.D.   On: 01/26/2022 03:10   DG Hip Unilat W or Wo Pelvis 2-3 Views Left  Result Date: 01/26/2022 CLINICAL DATA:  Left hip pain after fall EXAM: DG HIP (WITH OR WITHOUT PELVIS) 2-3V LEFT COMPARISON:   None Available. FINDINGS: Irregularity about the left femoral neck with foreshortening compatible acute femoral neck fracture. Body habitus limits assessment on lateral view. IMPRESSION: Acute minimally displaced left femoral neck fracture. Electronically Signed   By: Placido Sou M.D.   On: 01/26/2022 03:02     Time coordinating discharge: Over 30 minutes    Dwyane Dee, MD  [facility] 02/02/2022, 11:55 AM

## 2022-02-02 NOTE — TOC Transition Note (Signed)
Transition of Care [facility]) - CM/SW Discharge Note   Patient Details  Name: Sandra Riley MRN: [id number] Date of Birth: 09-Jan-1961  Transition of Care [facility]) CM/SW Contact:  Joanne Chars, LCSW Phone Number: 02/02/2022, 12:31 PM   Clinical Narrative:   PT discharging to [facility].  RN call report to [phone].    0830: CSW confirmed with [facility] that they can receive pt today.    Final next level of care: Skilled Nursing Facility Barriers to Discharge: Barriers Resolved   Patient Goals and CMS Choice CMS Medicare.gov Compare Post Acute Care list provided to:: Patient Choice offered to / list presented to : Patient  Discharge Placement                Patient chooses bed at:  [facility]) Patient to be transferred to facility by: [place] Name of family member notified: husband Coralyn Mark Patient and family notified of of transfer: 02/02/22  Discharge Plan and Services Additional resources added to the After Visit Summary for   In-house Referral: Clinical Social Work   Post Acute Care Choice: [place]                               Social Determinants of Health ([place]) Interventions [place]: No Food Insecurity (01/31/2022)  Housing: Low Risk  (01/31/2022)  Transportation Needs: No Transportation Needs (01/31/2022)  Utilities: Not At Risk (01/31/2022)  Alcohol Screen: Low Risk  (01/28/2021)  Depression (PHQ2-9): Low Risk  (12/09/2021)  Financial Resource Strain: Low Risk  (01/28/2021)  Recent Concern: Financial Resource Strain - Medium Risk (11/13/2020)  Physical Activity: Insufficiently Active (11/13/2020)  Social Connections: Moderately Integrated (01/28/2021)  Stress: Stress Concern Present (06/19/2021)  Tobacco Use: Low Risk  (01/29/2022)     Readmission Risk Interventions     No data to display

## 2022-02-02 NOTE — Plan of Care (Signed)
  Problem: Health Behavior/Discharge Planning: Goal: Ability to manage health-related needs will improve Outcome: Progressing   Problem: Activity: Goal: Risk for activity intolerance will decrease Outcome: Progressing   Problem: Nutrition: Goal: Adequate nutrition will be maintained Outcome: Progressing   Problem: Elimination: Goal: Will not experience complications related to bowel motility Outcome: Progressing   Problem: Elimination: Goal: Will not experience complications related to urinary retention Outcome: Progressing

## 2022-02-02 NOTE — Patient Instructions (Signed)

## 2022-02-02 NOTE — Telephone Encounter (Signed)
Patient called in  needing refills on   PHENObarbital (LUMINAL) 100 MG tablet  spironolactone (ALDACTONE) 50 MG tablet  mirtazapine (REMERON) 45 MG tablet   clonazePAM (KLONOPIN) 1 MG tablet  amLODipine (NORVASC) 5 MG tablet  alendronate (FOSAMAX) 35 MG tablet  Requesting refill on   levETIRAcetam (KEPPRA) tablet 1,000 mg   [facility]    Patient also leaving FYI. Currently in hospital with Hip replacement, will be heading to Rehab center once dc from hospital.

## 2022-02-02 NOTE — Progress Notes (Signed)
Occupational Therapy Treatment Patient Details Name: Sandra Riley MRN: [id number] DOB: 09/11/1960 Today's Date: 02/02/2022   History of present illness Pt is a 62 y/o female admitted s/p fall at home resulting in minimally displaced L femoral neck fx. Pt underwent L THA with anterior approach on 12/27. PMH: GAD, seizures, depression, HTN   OT comments  Pt in bed upon therapy arrival and agreeable to participate in OT treatment session. Pt states that she thinks she'll be leaving for rehab facility today. Session focused on functional transfers, bed mobility and establishing a HEP for BUE strength. Pt able to show improvement with bed mobility while only requiring min guard to transition to seated on EOB. VC initially provided for proper hand placement on bed and RW prior to standing. Pt provided with handout for HEP while therapist provided visual demonstration and verbal instructions. Pt returned demonstration requiring Min A for proper form and technique as needed.    Recommendations for follow up therapy are one component of a multi-disciplinary discharge planning process, led by the attending physician.  Recommendations may be updated based on patient status, additional functional criteria and insurance authorization.    Follow Up Recommendations  Skilled nursing-short term rehab (<3 hours/day)     Assistance Recommended at Discharge Frequent or constant Supervision/Assistance  Patient can return home with the following  A lot of help with walking and/or transfers;A lot of help with bathing/dressing/bathroom;Help with stairs or ramp for entrance;Assist for transportation   Equipment Recommendations  Other (comment) (defer to next venue of care)       Precautions / Restrictions Precautions Precautions: Fall Restrictions Weight Bearing Restrictions: Yes LLE Weight Bearing: Weight bearing as tolerated       Mobility Bed Mobility Overal bed mobility: Needs Assistance Bed  Mobility: Supine to Sit     Supine to sit: Min guard, HOB elevated       Patient Response: Cooperative  Transfers Overall transfer level: Needs assistance Equipment used: Rolling walker (2 wheels) Transfers: Sit to/from Stand, Bed to chair/wheelchair/BSC Sit to Stand: Min guard     Step pivot transfers: Min guard     General transfer comment: VC for proper hand placement on RW and mattress prior to standing.     Balance Overall balance assessment: Needs assistance Sitting-balance support: No upper extremity supported, Feet supported Sitting balance-Leahy Scale: Good     Standing balance support: Bilateral upper extremity supported, During functional activity Standing balance-Leahy Scale: Fair           ADL either performed or assessed with clinical judgement      Cognition Arousal/Alertness: Awake/alert   Overall Cognitive Status: Within Functional Limits for tasks assessed              Exercises General Exercises - Upper Extremity Shoulder Flexion: Strengthening, Both, 10 reps, Seated, Theraband Theraband Level (Shoulder Flexion): Level 2 (Red) Shoulder ADduction: Strengthening, Both, 10 reps, Seated, Theraband Theraband Level (Shoulder Adduction): Level 2 (Red) Shoulder Horizontal ABduction: Strengthening, Both, 10 reps, Seated, Theraband Theraband Level (Shoulder Horizontal Abduction): Level 2 (Red) Shoulder Horizontal ADduction: Strengthening, Both, 10 reps, Seated, Theraband Theraband Level (Shoulder Horizontal Adduction): Level 2 (Red) Shoulder Exercises Shoulder External Rotation: Strengthening, Both, 10 reps, Seated, Theraband Theraband Level (Shoulder External Rotation): Level 2 (Red)            Pertinent Vitals/ Pain       Pain Assessment Pain Assessment: 0-10 Pain Score: 7  Pain Location: L hip Pain Descriptors / Indicators: Throbbing, Aching  Pain Intervention(s): Monitored during session, Repositioned, Ice applied          Frequency  Min 2X/week        Progress Toward Goals  OT Goals(current goals can now be found in the care plan section)  Progress towards OT goals: Progressing toward goals  Acute Rehab OT Goals Patient Stated Goal: to go to rehab today  Plan Discharge plan remains appropriate;Frequency remains appropriate       AM-PAC OT "6 Clicks" Daily Activity     Outcome Measure   Help from another person eating meals?: None Help from another person taking care of personal grooming?: None Help from another person toileting, which includes using toliet, bedpan, or urinal?: A Little Help from another person bathing (including washing, rinsing, drying)?: A Lot Help from another person to put on and taking off regular upper body clothing?: None Help from another person to put on and taking off regular lower body clothing?: A Lot 6 Click Score: 19    End of Session Equipment Utilized During Treatment: Rolling walker (2 wheels)  OT Visit Diagnosis: Unsteadiness on feet (R26.81);Other abnormalities of gait and mobility (R26.89);Muscle weakness (generalized) (M62.81)   Activity Tolerance Patient tolerated treatment well   Patient Left in chair;with call bell/phone within reach;with chair alarm set           Time: 1206-1227 OT Time Calculation (min): 21 min  Charges: OT General Charges $OT Visit: 1 Visit OT Treatments $Self Care/Home Management : 8-22 mins $Therapeutic Exercise: 8-22 mins  Ailene Ravel, OTR/L,CBIS  Supplemental OT - [facility] and [facility] Secure Chat Preferred    Detrice Cales, Clarene Duke 02/02/2022, 2:09 PM

## 2022-02-02 NOTE — Telephone Encounter (Signed)
FYI on the rehab after hip surgery

## 2022-02-16 ENCOUNTER — Ambulatory Visit (INDEPENDENT_AMBULATORY_CARE_PROVIDER_SITE_OTHER): Payer: 59 | Admitting: Physician Assistant

## 2022-02-16 ENCOUNTER — Encounter: Payer: Self-pay | Admitting: Orthopaedic Surgery

## 2022-02-16 ENCOUNTER — Ambulatory Visit (INDEPENDENT_AMBULATORY_CARE_PROVIDER_SITE_OTHER): Payer: 59

## 2022-02-16 DIAGNOSIS — Z96642 Presence of left artificial hip joint: Secondary | ICD-10-CM

## 2022-02-16 NOTE — Progress Notes (Signed)
Post-Op Visit Note   Patient: Sandra Riley           Date of Birth: 05-Jan-1961           MRN: [id number] Visit Date: 02/16/2022 PCP: Fayrene Helper, MD   Assessment & Plan:  Chief Complaint:  Chief Complaint  Patient presents with   Left Hip - Follow-up    Left total hip arthroplasty 01/27/2022   Visit Diagnoses:  1. S/P total left hip arthroplasty     Plan: Patient is a pleasant 62 year old female who comes in today 3 weeks status post left total hip replacement from a femoral neck fracture 01/27/2022.  She has been doing relatively well.  She lives at [facility] at [facility].  She has been doing PT.  She tells me she walks unassisted at baseline prior to the fracture.  Examination of her left hip reveals a well-healed surgical incision without complication.  Calves are soft nontender.  She is neurovascular intact distally.  Today, sutures were removed and Steri-Strips applied.  She will continue with PT.  She will follow-up with [place] in 5 weeks for repeat evaluation and [facility] pelvis x-rays.  Call with concerns or questions.  Follow-Up Instructions: Return in about 5 weeks (around 03/23/2022).   Orders:  Orders Placed This Encounter  Procedures   XR HIP UNILAT W OR W/O PELVIS 2-3 VIEWS LEFT   No orders of the defined types were placed in this encounter.   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[facility])     Family History  Problem Relation Age of Onset   Breast cancer Mother 34   Coronary artery disease Father    Stroke Father 13   Breast cancer Sister 29   Breast cancer Sister    Breast cancer Sister    Diabetes Brother    Heart attack Brother     Past Surgical History:  Procedure Laterality Date   ABDOMINAL HYSTERECTOMY     CESAREAN SECTION     CHOLECYSTECTOMY     COLONOSCOPY WITH PROPOFOL N/A 02/15/2019   Procedure: COLONOSCOPY WITH PROPOFOL;  Surgeon: Daneil Dolin, MD;  [facility];  Service: Endoscopy;  Laterality: N/A;  2:45pm   POLYPECTOMY  02/15/2019   Procedure: POLYPECTOMY;  Surgeon: Daneil Dolin, MD;  [facility];  Service: Endoscopy;;   TOTAL HIP ARTHROPLASTY Left 01/27/2022   Procedure: TOTAL HIP ARTHROPLASTY ANTERIOR APPROACH;  Surgeon: Frankey Shown  M, MD;  Location: [place];  Service: Orthopedics;  Laterality: Left;   Social History   Occupational History   Occupation: disabled  Tobacco Use   Smoking status: Never   Smokeless tobacco: Never  Vaping Use   Vaping Use: Never used  Substance and Sexual Activity   Alcohol use: No   Drug use: No   Sexual activity: Yes    Birth control/protection: Surgical

## 2022-02-17 ENCOUNTER — Encounter: Payer: Medicare Other | Admitting: Orthopaedic Surgery

## 2022-02-17 ENCOUNTER — Encounter: Payer: Self-pay | Admitting: Neurology

## 2022-02-17 ENCOUNTER — Ambulatory Visit (INDEPENDENT_AMBULATORY_CARE_PROVIDER_SITE_OTHER): Payer: 59 | Admitting: Neurology

## 2022-02-17 VITALS — BP 145/80 | HR 98

## 2022-02-17 DIAGNOSIS — G40909 Epilepsy, unspecified, not intractable, without status epilepticus: Secondary | ICD-10-CM | POA: Diagnosis not present

## 2022-02-17 DIAGNOSIS — Z5181 Encounter for therapeutic drug level monitoring: Secondary | ICD-10-CM

## 2022-02-17 NOTE — Progress Notes (Signed)
[facility]  PATIENT: Sandra Riley DOB: 1960/08/31  REQUESTING CLINICIAN: Fayrene Helper, MD HISTORY FROM: Patient, husband and chart review  REASON FOR VISIT: Seizure disorder, here to establish care    HISTORICAL  CHIEF COMPLAINT:  Chief Complaint  Patient presents with   New Patient (Initial Visit)    Rm 12 NP internal referral for Refractory epilepsy-TOC from Dr. Skipper Cliche records from [facility]/ Last seizure in 01/2022, keppra was decreased to '500mg'$  bid, states she is not sleeping well     HISTORY OF PRESENT ILLNESS:  This is a 62 year old woman past medical history of seizure disorder, anxiety, depression, osteoporosis, recent fall with hip fracture who is presenting to establish care.  Patient reports a long history of seizure diagnosed at the age of 52.  Initially she describes her seizures as generalized convulsion with tongue biting.  He was on phenobarbital and Dilantin.  She reported her seizure was uncontrolled until she initially follow-up with Dr. Merlene Laughter who added levetiracetam.  Since being on levetiracetam and phenobarbital she has not had any generalized convulsion and this is more than 20 years ago.  Now she is reporting seizure that she described as disorientation and losing track of time and confusion.  She still having those and they have been worse in the past year since her son died\.  Currently she is on phenobarbital 97.2 mg 2 tabs nightly and Keppra 1500 mg twice daily.  While in rehab, she was told that her phenobarbital level was elevated therefore it was decreased to 1 tablet on Saturday and Sunday.  She denies any symptoms  associated with phenobarb toxicity and she reports that her last seizure that she described as disorientation and losing track of time was in December 2023 since then she has not had any additional seizures.  She is tolerating the medications very well and denies any side effect. She denies any seizure  risk factors, denies any family history of seizures denies any injury from seizures. Her fall and hip fracture was not related to a seizure.   Handedness: Right handed   Onset: At the age of 20  Seizure Type: Disorientation, losing track of time, last convulsion more than 20 year ago   Current frequency: Last seiure was in Dec 2023  Any injuries from seizures: Tongue biting   Seizure risk factors: Denies   Previous ASMs: Phenobarbital, Levetiracetam, Dilantin,   Currenty ASMs: Phenobarbital, and Levetiracetam   ASMs side effects: None   Brain Images: None available for review   Previous EEGs: EEG 2013: Normal    OTHER MEDICAL CONDITIONS: Seizure disorder, recent fall and hip fracture, Hypertension, osteoporosis, anxiety/depression   REVIEW OF SYSTEMS: Full 14 system review of systems performed and negative with exception of: As noted in the HPI   ALLERGIES: Allergies  Allergen Reactions   Aspirin     Allergic to non coated ASA Caused gastric ulcers    Nsaids Other (See Comments)    Directed by MD to not take.    Singulair [Montelukast Sodium] Other (See Comments)    Became sweaty and nuaseated    HOME MEDICATIONS: Outpatient Medications Prior to Visit  Medication Sig Dispense Refill   acetaminophen (TYLENOL) 500 MG tablet Take 1,000 mg by mouth every 6 (six) hours as needed for moderate pain.     alendronate (FOSAMAX) 35 MG tablet Take 1 tablet (35 mg total) by mouth every 7 (seven) days. Take with a full glass of water on an empty stomach. 5  tablet 5   amLODipine (NORVASC) 5 MG tablet TAKE ONE TABLET BY MOUTH DAILY (Patient taking differently: Take 5 mg by mouth daily.) 30 tablet 3   Ascorbic Acid (VITAMIN C PO) Take 1 tablet by mouth daily.     BIOTIN 5000 PO Take 5,000 mcg by mouth once. daily     Calcium Carb-Cholecalciferol 1000-800 MG-UNIT TABS Take 1 tablet by mouth 3 (three) times daily.      Cholecalciferol (VITAMIN D) 50 MCG (2000 UT) CAPS Take 1 capsule  by mouth daily.     clonazePAM (KLONOPIN) 1 MG tablet Take 1 tablet (1 mg total) by mouth 2 (two) times daily as needed for anxiety. 10 tablet 0   ferrous sulfate 325 (65 FE) MG tablet Take 325 mg by mouth once. daily     Humidifier MISC Large humidifier. 1 each 0   levETIRAcetam (KEPPRA) 1000 MG tablet Take 1 tablet (1,000 mg total) by mouth 2 (two) times daily. (Patient taking differently: Take 500 mg by mouth 2 (two) times daily.)     MAGNESIUM PO Take 500 mg by mouth daily.      mirtazapine (REMERON) 45 MG tablet Take 1 tablet (45 mg total) by mouth at bedtime. 30 tablet 5   Multiple Vitamin (MULTIVITAMIN) tablet Take 1 tablet by mouth daily.     oxyCODONE (OXY IR/ROXICODONE) 5 MG immediate release tablet Take 1-2 tablets (5-10 mg total) by mouth every 6 (six) hours as needed for moderate pain (pain score 4-6). 30 tablet 0   PHENObarbital (LUMINAL) 100 MG tablet Take one tablet twice daily Monday through Friday , and take one tablet once daily Saturday and Sunday 10 tablet 0   polyethylene glycol (MIRALAX / GLYCOLAX) 17 g packet Take 17 g by mouth daily as needed for mild constipation. 14 each 0   Potassium 99 MG TABS Take 99 mg by mouth daily.      sorbitol 70 % SOLN Take 30 mLs by mouth daily as needed for moderate constipation.     spironolactone (ALDACTONE) 50 MG tablet Take 1 tablet (50 mg total) by mouth daily. 90 tablet 3   tamsulosin (FLOMAX) 0.4 MG CAPS capsule Take 1 capsule (0.4 mg total) by mouth daily after supper. 30 capsule    UNABLE TO FIND Bed rail x 1 DX: 56.9 1 each 0   UNABLE TO FIND Humidifier DX: 30.1 1 each 0   zinc gluconate 50 MG tablet Take 50 mg by mouth daily.     enoxaparin (LOVENOX) 40 MG/0.4ML injection Inject 0.4 mLs (40 mg total) into the skin daily for 14 doses. 5.6 mL 0   No facility-administered medications prior to visit.    PAST MEDICAL HISTORY: Past Medical History:  Diagnosis Date   Abnormal facial hair 05/11/2011   Anxiety and depression    GAD  (generalized anxiety disorder)    Insomnia    Psychotic disorder (HCC)    Seizure disorder ([place])    stress related seizures. 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[place])     PAST SURGICAL HISTORY: Past Surgical History:  Procedure Laterality Date   ABDOMINAL HYSTERECTOMY     CESAREAN SECTION     CHOLECYSTECTOMY     COLONOSCOPY WITH PROPOFOL N/A 02/15/2019   Procedure: COLONOSCOPY WITH PROPOFOL;  Surgeon: Daneil Dolin, MD;  [facility];  Service: Endoscopy;  Laterality: N/A;  2:45pm   POLYPECTOMY  02/15/2019   Procedure: POLYPECTOMY;  Surgeon: Daneil Dolin,  MD;  [facility];  Service: Endoscopy;;   TOTAL HIP ARTHROPLASTY Left 01/27/2022   Procedure: TOTAL HIP ARTHROPLASTY ANTERIOR APPROACH;  Surgeon: Leandrew Koyanagi, MD;  Location: [place];  Service: Orthopedics;  Laterality: Left;    FAMILY HISTORY: Family History  Problem Relation Age of Onset   Breast cancer Mother 63   Coronary artery disease Father    Stroke Father 78   Breast cancer Sister 73   Breast cancer Sister    Breast cancer Sister    Diabetes Brother    Heart attack Brother     SOCIAL HISTORY: Social History   Socioeconomic History   Marital status: Married    Spouse name: Coralyn Mark   Number of children: 3   Years of education: 12th   Highest education level: 12th grade  Occupational History   Occupation: disabled  Tobacco Use   Smoking status: Never   Smokeless tobacco: Never  Vaping Use   Vaping Use: Never used  Substance and Sexual Activity   Alcohol use: No   Drug use: No   Sexual activity: Yes    Birth control/protection: Surgical  Other Topics Concern   Not on file  Social History Narrative   Not on file   Social Determinants of Health   Financial Resource Strain: Low Risk  (01/28/2021)   Overall Financial Resource Strain (CARDIA)    Difficulty of Paying Living Expenses: Not hard at all  Recent Concern: Financial Resource Strain - Medium Risk  (11/13/2020)   Overall Financial Resource Strain (CARDIA)    Difficulty of Paying Living Expenses: Somewhat hard  Food Insecurity: No Food Insecurity (01/31/2022)   Hunger Vital Sign    Worried About Running Out of Food in the Last Year: Never true    [place] in the Last Year: Never true  Transportation Needs: No Transportation Needs (01/31/2022)   PRAPARE - Hydrologist (Medical): No    Lack of Transportation (Non-Medical): No  Physical Activity: Insufficiently Active (11/13/2020)   Exercise Vital Sign    Days of Exercise per Week: 3 days    Minutes of Exercise per Session: 30 min  Stress: Stress Concern Present (06/19/2021)   [place]    Feeling of Stress : Rather much  Social Connections: Moderately Integrated (01/28/2021)   Social Connection and Isolation Panel [NHANES]    Frequency of Communication with Friends and Family: More than three times a week    Frequency of Social Gatherings with Friends and Family: Once a week    Attends Religious Services: More than 4 times per year    Active Member of [employer] or Organizations: No    Attends Archivist Meetings: Never    Marital Status: Married  Human resources officer Violence: Not At Risk (01/31/2022)   Humiliation, Afraid, Rape, and Kick questionnaire    Fear of Current or Ex-Partner: No    Emotionally Abused: No    Physically Abused: No    Sexually Abused: No    PHYSICAL EXAM  GENERAL EXAM/CONSTITUTIONAL: Vitals:  Vitals:   02/17/22 1307  BP: (!) 145/80  Pulse: 98   There is no height or weight on file to calculate BMI. Wt Readings from Last 3 Encounters:  01/27/22 134 lb (60.8 kg)  12/09/21 139 lb (63 kg)  07/17/21 132 lb (59.9 kg)   Patient is in no distress; well developed, nourished and groomed; neck is supple  EYES: Visual fields full to confrontation, Extraocular movements intacts,  No results  found.  MUSCULOSKELETAL: Gait, strength, tone, movements noted in Neurologic exam below  NEUROLOGIC: MENTAL STATUS:      No data to display         awake, alert, oriented to person, place and time recent and remote memory intact normal attention and concentration language fluent, comprehension intact, naming intact fund of knowledge appropriate  CRANIAL NERVE:  2nd, 3rd, 4th, 6th - Visual fields full to confrontation, extraocular muscles intact, no nystagmus 5th - facial sensation symmetric 7th - facial strength symmetric 8th - hearing intact 9th - palate elevates symmetrically, uvula midline 11th - shoulder shrug symmetric 12th - tongue protrusion midline  MOTOR:  normal bulk and tone, at least antigravity in the BUE and BLEs  SENSORY:  normal and symmetric to light touch  COORDINATION:  finger-nose-finger, fine finger movements normal  GAIT/STATION:  Deferred, on a wheelchair     DIAGNOSTIC DATA (LABS, IMAGING, TESTING) - I reviewed patient records, labs, notes, testing and imaging myself where available.  Lab Results  Component Value Date   WBC 12.0 (H) 02/02/2022   HGB 9.5 (L) 02/02/2022   HCT 27.9 (L) 02/02/2022   MCV 98.6 02/02/2022   PLT 396 02/02/2022      Component Value Date/Time   NA 133 (L) 02/02/2022 0344   NA 134 12/09/2021 1142   K 4.1 02/02/2022 0344   CL 94 (L) 02/02/2022 0344   CO2 29 02/02/2022 0344   GLUCOSE 109 (H) 02/02/2022 0344   BUN <5 (L) 02/02/2022 0344   BUN 3 (L) 12/09/2021 1142   CREATININE 0.46 02/02/2022 0344   CREATININE 0.49 (L) 11/29/2019 0926   CALCIUM 9.1 02/02/2022 0344   PROT 7.6 12/09/2021 1142   ALBUMIN 4.7 12/09/2021 1142   AST 24 12/09/2021 1142   ALT 18 12/09/2021 1142   ALKPHOS 173 (H) 12/09/2021 1142   BILITOT <0.2 12/09/2021 1142   GFRNONAA >60 02/02/2022 0344   GFRNONAA 105 11/29/2019 0926   GFRAA 120 02/27/2020 1508   GFRAA 122 11/29/2019 0926   Lab Results  Component Value Date   CHOL 174  12/09/2021   HDL 64 12/09/2021   LDLCALC 96 12/09/2021   TRIG 72 12/09/2021   Lab Results  Component Value Date   HGBA1C 5.6 02/27/2020   Lab Results  Component Value Date   [id number] 814 12/09/2021   Lab Results  Component Value Date   TSH 1.550 07/17/2021    EEG 2013: Normal    ASSESSMENT AND PLAN  62 y.o. year old female  with history of longstanding seizure disorder, anxiety/depression, recent fall with left hip fracture who is presenting to establish care.  For her seizures, she reports episodes concerning of seizures that she describes as disorientation, losing track of time and inability to speak.  Her last episode was in December.  She is currently on Keppra 1500 mg twice daily and phenobarb 97.2 mg 2 tablets Monday to Friday and 1 tablet Saturday and Sunday.  As of now, I will continue patient on the same medications and also check levels.  If she continues to have these additional events that are concerning for seizures, will make some adjustment to her medication.  I will also obtain routine EEG.  Follow-up in 6 months.   1. Seizure disorder ([place])   2. Therapeutic drug monitoring     Patient Instructions  Routine EEG Will obtain phenobarbital level with levetiracetam and CMP, Vit  D  Continue current medications  Return in 6 months or sooner if worse      Per [facility] statutes, patients with seizures are not allowed to drive until they have been seizure-free for six months.  Other recommendations include using caution when using heavy equipment or power tools. Avoid working on ladders or at heights. Take showers instead of baths.  Do not swim alone.  Ensure the water temperature is not too high on the home water heater. Do not go swimming alone. Do not lock yourself in a room alone (i.e. bathroom). When caring for infants or small children, sit down when holding, feeding, or changing them to minimize risk of injury to the child in the event you have a  seizure. Maintain good sleep hygiene. Avoid alcohol.  Also recommend adequate sleep, hydration, good diet and minimize stress.   During the Seizure  - First, ensure adequate ventilation and place patients on the floor on their left side  Loosen clothing around the neck and ensure the airway is patent. If the patient is clenching the teeth, do not force the mouth open with any object as this can cause severe damage - Remove all items from the surrounding that can be hazardous. The patient may be oblivious to what's happening and may not even know what he or she is doing. If the patient is confused and wandering, either gently guide him/her away and block access to outside areas - Reassure the individual and be comforting - Call 911. In most cases, the seizure ends before EMS arrives. However, there are cases when seizures may last over 3 to 5 minutes. Or the individual may have developed breathing difficulties or severe injuries. If a pregnant patient or a person with diabetes develops a seizure, it is prudent to call an ambulance. - Finally, if the patient does not regain full consciousness, then call EMS. Most patients will remain confused for about 45 to 90 minutes after a seizure, so you must use judgment in calling for help. - Avoid restraints but make sure the patient is in a bed with padded side rails - Place the individual in a lateral position with the neck slightly flexed; this will help the saliva drain from the mouth and prevent the tongue from falling backward - Remove all nearby furniture and other hazards from the area - Provide verbal assurance as the individual is regaining consciousness - Provide the patient with privacy if possible - Call for help and start treatment as ordered by the caregiver   After the Seizure (Postictal Stage)  After a seizure, most patients experience confusion, fatigue, muscle pain and/or a headache. Thus, one should permit the individual to sleep. For  the next few days, reassurance is essential. Being calm and helping reorient the person is also of importance.  Most seizures are painless and end spontaneously. Seizures are not harmful to others but can lead to complications such as stress on the lungs, brain and the heart. Individuals with prior lung problems may develop labored breathing and respiratory distress.     Orders Placed This Encounter  Procedures   Levetiracetam level   Phenobarbital level   CMP   Vitamin D, 25-hydroxy   EEG adult    No orders of the defined types were placed in this encounter.   Return in about 6 months (around 08/18/2022).    Alric Ran, MD 02/17/2022, 5:19 PM  [facility] [address] [place],  [address] [phone]

## 2022-02-17 NOTE — Patient Instructions (Addendum)
Routine EEG Will obtain phenobarbital level with levetiracetam and CMP, Vit D  Continue current medications  Return in 6 months or sooner if worse

## 2022-02-18 ENCOUNTER — Telehealth: Payer: Self-pay | Admitting: Family Medicine

## 2022-02-18 LAB — COMPREHENSIVE METABOLIC PANEL
ALT: 30 IU/L (ref 0–32)
AST: 31 IU/L (ref 0–40)
Albumin/Globulin Ratio: 1.1 — ABNORMAL LOW (ref 1.2–2.2)
Albumin: 4.2 g/dL (ref 3.9–4.9)
Alkaline Phosphatase: 292 IU/L — ABNORMAL HIGH (ref 44–121)
BUN/Creatinine Ratio: 9 — ABNORMAL LOW (ref 12–28)
BUN: 4 mg/dL — ABNORMAL LOW (ref 8–27)
Bilirubin Total: <0.2 mg/dL (ref 0.0–1.2)
CO2: 25 mmol/L (ref 20–29)
Calcium: 10.4 mg/dL — ABNORMAL HIGH (ref 8.7–10.3)
Chloride: 90 mmol/L — ABNORMAL LOW (ref 96–106)
Creatinine, Ser: 0.46 mg/dL — ABNORMAL LOW (ref 0.57–1.00)
Globulin, Total: 3.7 g/dL (ref 1.5–4.5)
Glucose: 98 mg/dL (ref 70–99)
Potassium: 5 mmol/L (ref 3.5–5.2)
Sodium: 128 mmol/L — ABNORMAL LOW (ref 134–144)
Total Protein: 7.9 g/dL (ref 6.0–8.5)
eGFR: 107 mL/min/{1.73_m2} (ref >59)

## 2022-02-18 LAB — PHENOBARBITAL LEVEL: Phenobarbital, Serum: 43 ug/mL (ref 15–40)

## 2022-02-18 LAB — VITAMIN D 25 HYDROXY (VIT D DEFICIENCY, FRACTURES): Vit D, 25-Hydroxy: 54.5 ng/mL (ref 30.0–100.0)

## 2022-02-18 LAB — LEVETIRACETAM LEVEL: Levetiracetam Lvl: 23 ug/mL (ref 10.0–40.0)

## 2022-02-18 NOTE — Telephone Encounter (Signed)
[facility]  rehab and nursing called on  patient behalf.   Will be discharging on 02/19/22 '   TOC appt made , TOC call needed

## 2022-02-19 ENCOUNTER — Telehealth: Payer: Self-pay | Admitting: Family Medicine

## 2022-02-19 ENCOUNTER — Other Ambulatory Visit: Payer: Self-pay | Admitting: Family Medicine

## 2022-02-19 DIAGNOSIS — Z9181 History of falling: Secondary | ICD-10-CM | POA: Diagnosis not present

## 2022-02-19 DIAGNOSIS — R2689 Other abnormalities of gait and mobility: Secondary | ICD-10-CM | POA: Diagnosis not present

## 2022-02-19 DIAGNOSIS — M6281 Muscle weakness (generalized): Secondary | ICD-10-CM | POA: Diagnosis not present

## 2022-02-19 DIAGNOSIS — S72042D Displaced fracture of base of neck of left femur, subsequent encounter for closed fracture with routine healing: Secondary | ICD-10-CM | POA: Diagnosis not present

## 2022-02-19 NOTE — Telephone Encounter (Signed)
Sandra Riley [name] [name] stating pt is needing a refill on her pain med. She is being discharged today & is sche for next week for Dr. Moshe Cipro.      oxyCODONE (OXY IR/ROXICODONE) 5 MG immediate release tablet

## 2022-02-19 NOTE — Telephone Encounter (Signed)
Pt already has oxycodone prescribed today by another Provider

## 2022-02-19 NOTE — Telephone Encounter (Signed)
Tried to call pt back and no answer and no voicemail.

## 2022-02-22 ENCOUNTER — Telehealth: Payer: Self-pay | Admitting: Family Medicine

## 2022-02-22 DIAGNOSIS — Z9181 History of falling: Secondary | ICD-10-CM | POA: Diagnosis not present

## 2022-02-22 DIAGNOSIS — M80052D Age-related osteoporosis with current pathological fracture, left femur, subsequent encounter for fracture with routine healing: Secondary | ICD-10-CM | POA: Diagnosis not present

## 2022-02-22 DIAGNOSIS — R339 Retention of urine, unspecified: Secondary | ICD-10-CM | POA: Diagnosis not present

## 2022-02-22 DIAGNOSIS — Z7901 Long term (current) use of anticoagulants: Secondary | ICD-10-CM | POA: Diagnosis not present

## 2022-02-22 DIAGNOSIS — F32A Depression, unspecified: Secondary | ICD-10-CM | POA: Diagnosis not present

## 2022-02-22 DIAGNOSIS — D509 Iron deficiency anemia, unspecified: Secondary | ICD-10-CM | POA: Diagnosis not present

## 2022-02-22 DIAGNOSIS — K5900 Constipation, unspecified: Secondary | ICD-10-CM | POA: Diagnosis not present

## 2022-02-22 DIAGNOSIS — Z96642 Presence of left artificial hip joint: Secondary | ICD-10-CM | POA: Diagnosis not present

## 2022-02-22 DIAGNOSIS — I10 Essential (primary) hypertension: Secondary | ICD-10-CM | POA: Diagnosis not present

## 2022-02-22 NOTE — Telephone Encounter (Signed)
Pt called back in regards to the messages sent about pain medication. Can you please contact patient?

## 2022-02-23 ENCOUNTER — Telehealth: Payer: Self-pay | Admitting: Family Medicine

## 2022-02-23 NOTE — Telephone Encounter (Signed)
Verbal order given  

## 2022-02-23 NOTE — Telephone Encounter (Signed)
Sandra Riley, [phone]   Wants verbal order for skilled nursing 1x wk for 4 wks 1x wk every other wk for 4 wks 1 PRN

## 2022-02-23 NOTE — Telephone Encounter (Signed)
Call could not be completed.

## 2022-02-25 ENCOUNTER — Encounter: Payer: Self-pay | Admitting: Family Medicine

## 2022-02-25 ENCOUNTER — Telehealth: Payer: Self-pay

## 2022-02-25 ENCOUNTER — Ambulatory Visit (INDEPENDENT_AMBULATORY_CARE_PROVIDER_SITE_OTHER): Payer: 59 | Admitting: Family Medicine

## 2022-02-25 ENCOUNTER — Telehealth: Payer: Self-pay | Admitting: Family Medicine

## 2022-02-25 VITALS — BP 128/84 | HR 86 | Ht 62.0 in

## 2022-02-25 DIAGNOSIS — Z9181 History of falling: Secondary | ICD-10-CM | POA: Diagnosis not present

## 2022-02-25 DIAGNOSIS — S72002S Fracture of unspecified part of neck of left femur, sequela: Secondary | ICD-10-CM

## 2022-02-25 DIAGNOSIS — F32A Depression, unspecified: Secondary | ICD-10-CM | POA: Diagnosis not present

## 2022-02-25 DIAGNOSIS — K5900 Constipation, unspecified: Secondary | ICD-10-CM | POA: Diagnosis not present

## 2022-02-25 DIAGNOSIS — R339 Retention of urine, unspecified: Secondary | ICD-10-CM | POA: Diagnosis not present

## 2022-02-25 DIAGNOSIS — D509 Iron deficiency anemia, unspecified: Secondary | ICD-10-CM | POA: Diagnosis not present

## 2022-02-25 DIAGNOSIS — Z96642 Presence of left artificial hip joint: Secondary | ICD-10-CM | POA: Diagnosis not present

## 2022-02-25 DIAGNOSIS — S72002D Fracture of unspecified part of neck of left femur, subsequent encounter for closed fracture with routine healing: Secondary | ICD-10-CM | POA: Diagnosis not present

## 2022-02-25 DIAGNOSIS — Z7901 Long term (current) use of anticoagulants: Secondary | ICD-10-CM | POA: Diagnosis not present

## 2022-02-25 DIAGNOSIS — M80052D Age-related osteoporosis with current pathological fracture, left femur, subsequent encounter for fracture with routine healing: Secondary | ICD-10-CM | POA: Diagnosis not present

## 2022-02-25 DIAGNOSIS — I10 Essential (primary) hypertension: Secondary | ICD-10-CM | POA: Diagnosis not present

## 2022-02-25 DIAGNOSIS — Z7689 Persons encountering health services in other specified circumstances: Secondary | ICD-10-CM | POA: Insufficient documentation

## 2022-02-25 DIAGNOSIS — S72009A Fracture of unspecified part of neck of unspecified femur, initial encounter for closed fracture: Secondary | ICD-10-CM | POA: Insufficient documentation

## 2022-02-25 DIAGNOSIS — G40309 Generalized idiopathic epilepsy and epileptic syndromes, not intractable, without status epilepticus: Secondary | ICD-10-CM

## 2022-02-25 MED ORDER — SPIRONOLACTONE 50 MG PO TABS: 50.0000 mg | ORAL_TABLET | Freq: Every day | ORAL | 3 refills | Status: DC

## 2022-02-25 MED ORDER — OXYCODONE HCL 5 MG PO TABS: ORAL_TABLET | ORAL | 0 refills | Status: DC

## 2022-02-25 NOTE — Progress Notes (Signed)
Please call and advise the patient that her recent Phenobarb was slightly elevated but we do not need to make any changes to the medication now.  Please also inform her there were electrolytes abnormalities (low sodium, low chloride, high calcium) and she needs to follow up with PCP to address those abnormalities.   Please remind patient to keep any upcoming appointments or tests and to call us with any interim questions, concerns, problems or updates. Thanks,   Alric Ran, MD

## 2022-02-25 NOTE — Telephone Encounter (Signed)
Tried calling pt and no answer. 

## 2022-02-25 NOTE — Progress Notes (Signed)
   Sandra Riley     MRN: [id number]      DOB: May 04, 1960   HPI Sandra Riley is here for follow up of recent hospitalization from 01/26/2022 to 02/02/2022 dueto fracture of left femoral neck post fall, had surgical repair at [facility], was discharged to rehab/ SNF and has been homesince 12/19 /2024 Generally doing well, except needs medication for pain, managing BM fairly well, will take daily stool softner if needed onpain med which should be rediced to ending Home PT to start next week. Has all the equipment needed at home ROS Denies recent fever or chills. Denies sinus pressure, nasal congestion, ear pain or sore throat. Denies chest congestion, productive cough or wheezing. Denies chest pains, palpitations and leg swelling Denies abdominal pain, nausea, vomiting,diarrhea or constipation.   Denies dysuria, frequency, hesitancy or incontinence. . Denies depression, anxiety or insomnia. Denies skin break down or rash.   PE  BP 128/84   Pulse 86   Ht '5\' 2"'$  (1.575 m)   SpO2 96%   BMI 24.51 kg/m   Patient alert and oriented and in no cardiopulmonary distress.  HEENT: No facial asymmetry, EOMI,     Neck supple .  Chest: Clear to auscultation bilaterally.  CVS: S1, S2 no murmurs, no S3.Regular rate.  ABD: Soft non tender.   Ext: No edema  MS: Adequate ROM spine, shoulders,  and knees.reduced in left hip  Skin: Incision site clean and dry, no drainage or erythema, well healed. Psych: Good eye contact, normal affect. Memory intact not anxious or depressed appearing.  CNS: CN 2-12 intact, power,  normal throughout.no focal deficits noted.   Assessment & Plan  Femoral neck fracture (Sandra Riley) Recovering well, surgical site clean and well healed, no infection at site Ambulates with assistive device at home and is to start home PT nex week Needs pain medication and after discussion that one pi;l daily is prescribed and that Ortho ill continue if neded when assesed in next 3  weeks  Generalized convulsive epilepsy (Sandra Riley) Stable has new pt appt in next 2 weeks with new neurologist  Essential hypertension Controlled, no change in medication   Encounter for support and coordination of transition of care Patient in for follow up of recent hospitalization and SNF  Discharge summary, and laboratory and radiology data are reviewed, and any questions or concerns  are discussed. Specific issues requiring follow up are specifically addressed.

## 2022-02-25 NOTE — Assessment & Plan Note (Signed)
Controlled, no change in medication  

## 2022-02-25 NOTE — Telephone Encounter (Signed)
-----  Message from Alric Ran, MD sent at 02/25/2022 12:24 PM EST ----- Please call and advise the patient that her recent Phenobarb was slightly elevated but we do not need to make any changes to the medication now.  Please also inform her there were electrolytes abnormalities (low sodium, low chloride, high calcium) and she needs to follow up with PCP to address those abnormalities.   Please remind patient to keep any upcoming appointments or tests and to call us with any interim questions, concerns, problems or updates. Thanks,   Alric Ran, MD

## 2022-02-25 NOTE — Telephone Encounter (Signed)
Attempted to call pt, VOICE MAIL full

## 2022-02-25 NOTE — Telephone Encounter (Signed)
Patient called back in regard to pain meds. Wants a call back in regard .  No pain meds at pharm.

## 2022-02-25 NOTE — Telephone Encounter (Signed)
Pt verified by name and DOB,  normal results given per provider, pt voiced understanding all question answered. °

## 2022-02-25 NOTE — Patient Instructions (Signed)
Follow-up in 4 months, call if you need me sooner.  CMP and magnesium level today.  I will check on pain medication as we discussed and will prescribe 1 tablet once daily if you do need any refills at this time only to last until you see the orthopedic doctor.  The expectation is is that you will not need to continue this type of medication indefinitely.   Please do go ahead and make arrangements for transport to your upcoming 2 appointments in [place] as we discussed   Thanks for choosing [facility], we consider it a privelige to serve you.

## 2022-02-25 NOTE — Assessment & Plan Note (Signed)
Recovering well, surgical site clean and well healed, no infection at site Ambulates with assistive device at home and is to start home PT nex week Needs pain medication and after discussion that one pi;l daily is prescribed and that Ortho ill continue if neded when assesed in next 3 weeks

## 2022-02-25 NOTE — Telephone Encounter (Signed)
Pt was seen this am & is calling in regards to medications sent to phar

## 2022-02-25 NOTE — Assessment & Plan Note (Signed)
Stable has new pt appt in next 2 weeks with new neurologist

## 2022-02-25 NOTE — Assessment & Plan Note (Signed)
Patient in for follow up of recent hospitalization and SNF  Discharge summary, and laboratory and radiology data are reviewed, and any questions or concerns  are discussed. Specific issues requiring follow up are specifically addressed.

## 2022-02-26 LAB — CMP14+EGFR
ALT: 15 IU/L (ref 0–32)
AST: 20 IU/L (ref 0–40)
Albumin/Globulin Ratio: 1.4 (ref 1.2–2.2)
Albumin: 4.3 g/dL (ref 3.9–4.9)
Alkaline Phosphatase: 237 IU/L — ABNORMAL HIGH (ref 44–121)
BUN/Creatinine Ratio: 8 — ABNORMAL LOW (ref 12–28)
BUN: 4 mg/dL — ABNORMAL LOW (ref 8–27)
Bilirubin Total: 0.2 mg/dL (ref 0.0–1.2)
CO2: 23 mmol/L (ref 20–29)
Calcium: 10 mg/dL (ref 8.7–10.3)
Chloride: 95 mmol/L — ABNORMAL LOW (ref 96–106)
Creatinine, Ser: 0.51 mg/dL — ABNORMAL LOW (ref 0.57–1.00)
Globulin, Total: 3 g/dL (ref 1.5–4.5)
Glucose: 93 mg/dL (ref 70–99)
Potassium: 4.8 mmol/L (ref 3.5–5.2)
Sodium: 133 mmol/L — ABNORMAL LOW (ref 134–144)
Total Protein: 7.3 g/dL (ref 6.0–8.5)
eGFR: 105 mL/min/{1.73_m2} (ref >59)

## 2022-02-26 LAB — MAGNESIUM: Magnesium: 1.9 mg/dL (ref 1.6–2.3)

## 2022-02-26 NOTE — Telephone Encounter (Signed)
Pt called back to speak to Dr. Moshe Cipro

## 2022-02-26 NOTE — Telephone Encounter (Signed)
Patient aware pain medication sent to pharmacy

## 2022-03-01 ENCOUNTER — Telehealth: Payer: Self-pay

## 2022-03-01 NOTE — Telephone Encounter (Signed)
Error

## 2022-03-01 NOTE — Telephone Encounter (Signed)
-----  Message from Alric Ran, MD sent at 02/25/2022 12:24 PM EST ----- Please call and advise the patient that her recent Phenobarb was slightly elevated but we do not need to make any changes to the medication now.  Please also inform her there were electrolytes abnormalities (low sodium, low chloride, high calcium) and she needs to follow up with PCP to address those abnormalities.   Please remind patient to keep any upcoming appointments or tests and to call us with any interim questions, concerns, problems or updates. Thanks,   Alric Ran, MD

## 2022-03-04 ENCOUNTER — Telehealth: Payer: Self-pay | Admitting: Family Medicine

## 2022-03-04 DIAGNOSIS — Z9181 History of falling: Secondary | ICD-10-CM | POA: Diagnosis not present

## 2022-03-04 DIAGNOSIS — F32A Depression, unspecified: Secondary | ICD-10-CM | POA: Diagnosis not present

## 2022-03-04 DIAGNOSIS — I10 Essential (primary) hypertension: Secondary | ICD-10-CM | POA: Diagnosis not present

## 2022-03-04 DIAGNOSIS — Z7901 Long term (current) use of anticoagulants: Secondary | ICD-10-CM | POA: Diagnosis not present

## 2022-03-04 DIAGNOSIS — D509 Iron deficiency anemia, unspecified: Secondary | ICD-10-CM | POA: Diagnosis not present

## 2022-03-04 DIAGNOSIS — R339 Retention of urine, unspecified: Secondary | ICD-10-CM | POA: Diagnosis not present

## 2022-03-04 DIAGNOSIS — K5900 Constipation, unspecified: Secondary | ICD-10-CM | POA: Diagnosis not present

## 2022-03-04 DIAGNOSIS — M80052D Age-related osteoporosis with current pathological fracture, left femur, subsequent encounter for fracture with routine healing: Secondary | ICD-10-CM | POA: Diagnosis not present

## 2022-03-04 DIAGNOSIS — Z96642 Presence of left artificial hip joint: Secondary | ICD-10-CM | POA: Diagnosis not present

## 2022-03-04 NOTE — Telephone Encounter (Signed)
Stacy from [facility] called needs verbal orders for physical therapy 1 week for 1 week and 2 week for 3 weeks. Call back # [phone]

## 2022-03-04 NOTE — Telephone Encounter (Signed)
Verbal orders given  

## 2022-03-05 DIAGNOSIS — D509 Iron deficiency anemia, unspecified: Secondary | ICD-10-CM | POA: Diagnosis not present

## 2022-03-05 DIAGNOSIS — Z96642 Presence of left artificial hip joint: Secondary | ICD-10-CM | POA: Diagnosis not present

## 2022-03-05 DIAGNOSIS — Z7901 Long term (current) use of anticoagulants: Secondary | ICD-10-CM | POA: Diagnosis not present

## 2022-03-05 DIAGNOSIS — M80052D Age-related osteoporosis with current pathological fracture, left femur, subsequent encounter for fracture with routine healing: Secondary | ICD-10-CM | POA: Diagnosis not present

## 2022-03-05 DIAGNOSIS — I10 Essential (primary) hypertension: Secondary | ICD-10-CM | POA: Diagnosis not present

## 2022-03-05 DIAGNOSIS — Z9181 History of falling: Secondary | ICD-10-CM | POA: Diagnosis not present

## 2022-03-05 DIAGNOSIS — K5900 Constipation, unspecified: Secondary | ICD-10-CM | POA: Diagnosis not present

## 2022-03-05 DIAGNOSIS — F32A Depression, unspecified: Secondary | ICD-10-CM | POA: Diagnosis not present

## 2022-03-05 DIAGNOSIS — R339 Retention of urine, unspecified: Secondary | ICD-10-CM | POA: Diagnosis not present

## 2022-03-08 DIAGNOSIS — G40409 Other generalized epilepsy and epileptic syndromes, not intractable, without status epilepticus: Secondary | ICD-10-CM

## 2022-03-08 DIAGNOSIS — F32A Depression, unspecified: Secondary | ICD-10-CM | POA: Diagnosis not present

## 2022-03-08 DIAGNOSIS — F39 Unspecified mood [affective] disorder: Secondary | ICD-10-CM

## 2022-03-08 DIAGNOSIS — Z96642 Presence of left artificial hip joint: Secondary | ICD-10-CM | POA: Diagnosis not present

## 2022-03-08 DIAGNOSIS — D509 Iron deficiency anemia, unspecified: Secondary | ICD-10-CM | POA: Diagnosis not present

## 2022-03-08 DIAGNOSIS — K5900 Constipation, unspecified: Secondary | ICD-10-CM | POA: Diagnosis not present

## 2022-03-08 DIAGNOSIS — I10 Essential (primary) hypertension: Secondary | ICD-10-CM | POA: Diagnosis not present

## 2022-03-08 DIAGNOSIS — R339 Retention of urine, unspecified: Secondary | ICD-10-CM | POA: Diagnosis not present

## 2022-03-08 DIAGNOSIS — Z7901 Long term (current) use of anticoagulants: Secondary | ICD-10-CM | POA: Diagnosis not present

## 2022-03-08 DIAGNOSIS — M80052D Age-related osteoporosis with current pathological fracture, left femur, subsequent encounter for fracture with routine healing: Secondary | ICD-10-CM | POA: Diagnosis not present

## 2022-03-08 DIAGNOSIS — F419 Anxiety disorder, unspecified: Secondary | ICD-10-CM

## 2022-03-08 DIAGNOSIS — R41841 Cognitive communication deficit: Secondary | ICD-10-CM

## 2022-03-08 DIAGNOSIS — Z8701 Personal history of pneumonia (recurrent): Secondary | ICD-10-CM

## 2022-03-08 DIAGNOSIS — Z9181 History of falling: Secondary | ICD-10-CM

## 2022-03-10 ENCOUNTER — Ambulatory Visit (INDEPENDENT_AMBULATORY_CARE_PROVIDER_SITE_OTHER): Payer: 59 | Admitting: Neurology

## 2022-03-10 DIAGNOSIS — G40909 Epilepsy, unspecified, not intractable, without status epilepticus: Secondary | ICD-10-CM | POA: Diagnosis not present

## 2022-03-10 MED ORDER — PHENOBARBITAL 97.2 MG PO TABS: 194.4000 mg | ORAL_TABLET | Freq: Every day | ORAL | 3 refills | Status: DC

## 2022-03-10 MED ORDER — LEVETIRACETAM 750 MG PO TABS: 1500.0000 mg | ORAL_TABLET | Freq: Two times a day (BID) | ORAL | 3 refills | Status: DC

## 2022-03-10 NOTE — Procedures (Signed)
    History:  62 year old woman with seizure disorder  EEG classification: Awake and drowsy  Description of the recording: The background rhythms of this recording consists of a fairly well modulated medium amplitude alpha rhythm of 11-12 Hz that is reactive to eye opening and closure. Present in the anterior head region is a 15-20 Hz beta activity. Photic stimulation was performed, did not show any abnormalities. Hyperventilation was also performed, did not show any abnormalities. Drowsiness was manifested by background fragmentation.There were left temporal sharps seen during this recording. There was no focal slowing. There were no electrographic seizure identified.   Abnormality: Left temporal sharps.   Impression: This is an abnormal EEG recorded while drowsy and awake due to presence of left temporal sharps consistent with an area of increase epileptogenic potential.    Alric Ran, MD [facility]

## 2022-03-15 ENCOUNTER — Other Ambulatory Visit: Payer: Self-pay | Admitting: Neurology

## 2022-03-15 ENCOUNTER — Other Ambulatory Visit: Payer: Self-pay

## 2022-03-15 ENCOUNTER — Telehealth: Payer: Self-pay | Admitting: Family Medicine

## 2022-03-15 ENCOUNTER — Other Ambulatory Visit: Payer: Self-pay | Admitting: Family Medicine

## 2022-03-15 MED ORDER — CLONAZEPAM 1 MG PO TABS: 1.0000 mg | ORAL_TABLET | Freq: Two times a day (BID) | ORAL | 5 refills | Status: DC

## 2022-03-15 MED ORDER — PHENOBARBITAL 100 MG PO TABS: 100.0000 mg | ORAL_TABLET | Freq: Every day | ORAL | 1 refills | Status: DC

## 2022-03-15 MED ORDER — MIRTAZAPINE 45 MG PO TABS: 45.0000 mg | ORAL_TABLET | Freq: Every day | ORAL | 5 refills | Status: DC

## 2022-03-15 NOTE — Telephone Encounter (Signed)
Ok to send 178m.

## 2022-03-15 NOTE — Telephone Encounter (Signed)
Caryl Pina from [facility] called and LVM needing a call back to clarify the Rx they received for the pt's Phenobarbital.

## 2022-03-15 NOTE — Telephone Encounter (Signed)
Pt is calling. Requesting a refill on levETIRAcetam (KEPPRA) 750 MG tablet. Pt said pharmacy can't get PHENobarbital (LUMINAL) 97.2 MG tablet  but they can get the 100 mg. Refill should be sent to  [facility]

## 2022-03-15 NOTE — Telephone Encounter (Signed)
Patient called need refill  mirtazapine (REMERON) 45 MG tablet [id number]   Pharmacy  [place], [place] - [place] [address], [place] [address] Phone: [phone]  Fax: [phone]

## 2022-03-15 NOTE — Telephone Encounter (Signed)
Refill sent to dr. April Manson to sign off on

## 2022-03-15 NOTE — Telephone Encounter (Signed)
Refills sent to pharmacy. 

## 2022-03-15 NOTE — Telephone Encounter (Signed)
Patient called in requesting refill on   clonazePAM (KLONOPIN) 1 MG tablet   [facility], [place]

## 2022-03-15 NOTE — Progress Notes (Signed)
Done

## 2022-03-16 ENCOUNTER — Telehealth: Payer: Self-pay | Admitting: Family Medicine

## 2022-03-16 DIAGNOSIS — Z9181 History of falling: Secondary | ICD-10-CM | POA: Diagnosis not present

## 2022-03-16 DIAGNOSIS — F32A Depression, unspecified: Secondary | ICD-10-CM | POA: Diagnosis not present

## 2022-03-16 DIAGNOSIS — Z96642 Presence of left artificial hip joint: Secondary | ICD-10-CM | POA: Diagnosis not present

## 2022-03-16 DIAGNOSIS — I10 Essential (primary) hypertension: Secondary | ICD-10-CM | POA: Diagnosis not present

## 2022-03-16 DIAGNOSIS — M80052D Age-related osteoporosis with current pathological fracture, left femur, subsequent encounter for fracture with routine healing: Secondary | ICD-10-CM | POA: Diagnosis not present

## 2022-03-16 DIAGNOSIS — R339 Retention of urine, unspecified: Secondary | ICD-10-CM | POA: Diagnosis not present

## 2022-03-16 DIAGNOSIS — Z7901 Long term (current) use of anticoagulants: Secondary | ICD-10-CM | POA: Diagnosis not present

## 2022-03-16 DIAGNOSIS — D509 Iron deficiency anemia, unspecified: Secondary | ICD-10-CM | POA: Diagnosis not present

## 2022-03-16 DIAGNOSIS — K5900 Constipation, unspecified: Secondary | ICD-10-CM | POA: Diagnosis not present

## 2022-03-16 MED ORDER — LEVETIRACETAM 750 MG PO TABS: 1500.0000 mg | ORAL_TABLET | Freq: Two times a day (BID) | ORAL | 3 refills | Status: DC

## 2022-03-16 MED ORDER — PHENOBARBITAL 100 MG PO TABS: ORAL_TABLET | ORAL | 1 refills | Status: DC

## 2022-03-16 NOTE — Telephone Encounter (Signed)
Verbal order given  

## 2022-03-16 NOTE — Telephone Encounter (Signed)
Called pharmacy and they were not open yet, will resend prescription with correct dosing per Dr. April Manson

## 2022-03-16 NOTE — Telephone Encounter (Signed)
[facility] called in on patient behalf.  verbal orders for Home health Occupational Thrapy  1x week for 4 weeks  Call back [phone].

## 2022-03-16 NOTE — Addendum Note (Signed)
Addended by: Kristen Loader on: 03/16/2022 08:53 AM   Modules accepted: Orders

## 2022-03-19 DIAGNOSIS — K5900 Constipation, unspecified: Secondary | ICD-10-CM | POA: Diagnosis not present

## 2022-03-19 DIAGNOSIS — F32A Depression, unspecified: Secondary | ICD-10-CM | POA: Diagnosis not present

## 2022-03-19 DIAGNOSIS — Z96642 Presence of left artificial hip joint: Secondary | ICD-10-CM | POA: Diagnosis not present

## 2022-03-19 DIAGNOSIS — I10 Essential (primary) hypertension: Secondary | ICD-10-CM | POA: Diagnosis not present

## 2022-03-19 DIAGNOSIS — Z7901 Long term (current) use of anticoagulants: Secondary | ICD-10-CM | POA: Diagnosis not present

## 2022-03-19 DIAGNOSIS — Z9181 History of falling: Secondary | ICD-10-CM | POA: Diagnosis not present

## 2022-03-19 DIAGNOSIS — R339 Retention of urine, unspecified: Secondary | ICD-10-CM | POA: Diagnosis not present

## 2022-03-19 DIAGNOSIS — D509 Iron deficiency anemia, unspecified: Secondary | ICD-10-CM | POA: Diagnosis not present

## 2022-03-19 DIAGNOSIS — M80052D Age-related osteoporosis with current pathological fracture, left femur, subsequent encounter for fracture with routine healing: Secondary | ICD-10-CM | POA: Diagnosis not present

## 2022-03-23 ENCOUNTER — Ambulatory Visit (INDEPENDENT_AMBULATORY_CARE_PROVIDER_SITE_OTHER): Payer: 59 | Admitting: Orthopaedic Surgery

## 2022-03-23 ENCOUNTER — Ambulatory Visit (INDEPENDENT_AMBULATORY_CARE_PROVIDER_SITE_OTHER): Payer: 59

## 2022-03-23 DIAGNOSIS — Z96642 Presence of left artificial hip joint: Secondary | ICD-10-CM

## 2022-03-23 NOTE — Progress Notes (Signed)
Post-Op Visit Note   Patient: Sandra Riley           Date of Birth: 09/26/1960           MRN: [id number] Visit Date: 03/23/2022 PCP: Fayrene Helper, MD   Assessment & Plan:  Chief Complaint:  Chief Complaint  Patient presents with   Left Hip - Routine Post Op   Visit Diagnoses:  1. S/P total left hip arthroplasty     Plan: [name] is 6 weeks status post left total hip replacement for femoral neck fracture.  She is getting home health PT once a week.  Walking with a walker.  Reports thigh discomfort with increased exertion.  Examination left hip shows fully healed surgical scar.  Painless fluid range of motion of the hip.  [name] is recovering as expected from the femoral neck fracture.  She will continue to do her rehab.  Recheck in 6 weeks with standing [facility] pelvis x-rays.  Follow-Up Instructions: Return in about 6 weeks (around 05/04/2022).   Orders:  Orders Placed This Encounter  Procedures   XR Pelvis 1-2 Views   No orders of the defined types were placed in this encounter.   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[facility])     Family History  Problem Relation Age of Onset   Breast cancer Mother 27   Coronary artery disease Father    Stroke Father 25   Breast cancer Sister 64   Breast cancer Sister    Breast cancer Sister    Diabetes Brother    Heart attack Brother     Past Surgical History:  Procedure Laterality Date   ABDOMINAL HYSTERECTOMY     CESAREAN SECTION     CHOLECYSTECTOMY     COLONOSCOPY WITH PROPOFOL N/A 02/15/2019   Procedure: COLONOSCOPY WITH PROPOFOL;  Surgeon: Daneil Dolin, MD;  [facility];  Service: Endoscopy;  Laterality: N/A;  2:45pm   POLYPECTOMY  02/15/2019   Procedure: POLYPECTOMY;  Surgeon: Daneil Dolin, MD;  [facility];  Service: Endoscopy;;   TOTAL HIP ARTHROPLASTY Left 01/27/2022   Procedure: TOTAL HIP ARTHROPLASTY ANTERIOR APPROACH;  Surgeon: Leandrew Koyanagi, MD;  Location: [facility];  Service: Orthopedics;  Laterality: Left;   Social History   Occupational History   Occupation: disabled  Tobacco Use   Smoking  status: Never   Smokeless tobacco: Never  Vaping Use   Vaping Use: Never used  Substance and Sexual Activity   Alcohol use: No   Drug use: No   Sexual activity: Yes    Birth control/protection: Surgical

## 2022-03-25 DIAGNOSIS — Z7901 Long term (current) use of anticoagulants: Secondary | ICD-10-CM | POA: Diagnosis not present

## 2022-03-25 DIAGNOSIS — D509 Iron deficiency anemia, unspecified: Secondary | ICD-10-CM | POA: Diagnosis not present

## 2022-03-25 DIAGNOSIS — R339 Retention of urine, unspecified: Secondary | ICD-10-CM | POA: Diagnosis not present

## 2022-03-25 DIAGNOSIS — F32A Depression, unspecified: Secondary | ICD-10-CM | POA: Diagnosis not present

## 2022-03-25 DIAGNOSIS — I10 Essential (primary) hypertension: Secondary | ICD-10-CM | POA: Diagnosis not present

## 2022-03-25 DIAGNOSIS — Z96642 Presence of left artificial hip joint: Secondary | ICD-10-CM | POA: Diagnosis not present

## 2022-03-25 DIAGNOSIS — K5900 Constipation, unspecified: Secondary | ICD-10-CM | POA: Diagnosis not present

## 2022-03-25 DIAGNOSIS — M80052D Age-related osteoporosis with current pathological fracture, left femur, subsequent encounter for fracture with routine healing: Secondary | ICD-10-CM | POA: Diagnosis not present

## 2022-03-25 DIAGNOSIS — Z9181 History of falling: Secondary | ICD-10-CM | POA: Diagnosis not present

## 2022-04-01 DIAGNOSIS — Z96642 Presence of left artificial hip joint: Secondary | ICD-10-CM | POA: Diagnosis not present

## 2022-04-01 DIAGNOSIS — D509 Iron deficiency anemia, unspecified: Secondary | ICD-10-CM | POA: Diagnosis not present

## 2022-04-01 DIAGNOSIS — R339 Retention of urine, unspecified: Secondary | ICD-10-CM | POA: Diagnosis not present

## 2022-04-01 DIAGNOSIS — Z7901 Long term (current) use of anticoagulants: Secondary | ICD-10-CM | POA: Diagnosis not present

## 2022-04-01 DIAGNOSIS — K5900 Constipation, unspecified: Secondary | ICD-10-CM | POA: Diagnosis not present

## 2022-04-01 DIAGNOSIS — M80052D Age-related osteoporosis with current pathological fracture, left femur, subsequent encounter for fracture with routine healing: Secondary | ICD-10-CM | POA: Diagnosis not present

## 2022-04-01 DIAGNOSIS — I10 Essential (primary) hypertension: Secondary | ICD-10-CM | POA: Diagnosis not present

## 2022-04-01 DIAGNOSIS — F32A Depression, unspecified: Secondary | ICD-10-CM | POA: Diagnosis not present

## 2022-04-01 DIAGNOSIS — Z9181 History of falling: Secondary | ICD-10-CM | POA: Diagnosis not present

## 2022-04-02 DIAGNOSIS — Z7901 Long term (current) use of anticoagulants: Secondary | ICD-10-CM | POA: Diagnosis not present

## 2022-04-02 DIAGNOSIS — D509 Iron deficiency anemia, unspecified: Secondary | ICD-10-CM | POA: Diagnosis not present

## 2022-04-02 DIAGNOSIS — Z9181 History of falling: Secondary | ICD-10-CM | POA: Diagnosis not present

## 2022-04-02 DIAGNOSIS — R339 Retention of urine, unspecified: Secondary | ICD-10-CM | POA: Diagnosis not present

## 2022-04-02 DIAGNOSIS — F32A Depression, unspecified: Secondary | ICD-10-CM | POA: Diagnosis not present

## 2022-04-02 DIAGNOSIS — R2689 Other abnormalities of gait and mobility: Secondary | ICD-10-CM | POA: Diagnosis not present

## 2022-04-02 DIAGNOSIS — K5900 Constipation, unspecified: Secondary | ICD-10-CM | POA: Diagnosis not present

## 2022-04-02 DIAGNOSIS — Z96642 Presence of left artificial hip joint: Secondary | ICD-10-CM | POA: Diagnosis not present

## 2022-04-02 DIAGNOSIS — I10 Essential (primary) hypertension: Secondary | ICD-10-CM | POA: Diagnosis not present

## 2022-04-02 DIAGNOSIS — M6281 Muscle weakness (generalized): Secondary | ICD-10-CM | POA: Diagnosis not present

## 2022-04-02 DIAGNOSIS — S72042D Displaced fracture of base of neck of left femur, subsequent encounter for closed fracture with routine healing: Secondary | ICD-10-CM | POA: Diagnosis not present

## 2022-04-02 DIAGNOSIS — M80052D Age-related osteoporosis with current pathological fracture, left femur, subsequent encounter for fracture with routine healing: Secondary | ICD-10-CM | POA: Diagnosis not present

## 2022-04-05 DIAGNOSIS — Z96642 Presence of left artificial hip joint: Secondary | ICD-10-CM | POA: Diagnosis not present

## 2022-04-05 DIAGNOSIS — Z7901 Long term (current) use of anticoagulants: Secondary | ICD-10-CM | POA: Diagnosis not present

## 2022-04-05 DIAGNOSIS — I10 Essential (primary) hypertension: Secondary | ICD-10-CM | POA: Diagnosis not present

## 2022-04-05 DIAGNOSIS — M80052D Age-related osteoporosis with current pathological fracture, left femur, subsequent encounter for fracture with routine healing: Secondary | ICD-10-CM | POA: Diagnosis not present

## 2022-04-05 DIAGNOSIS — F32A Depression, unspecified: Secondary | ICD-10-CM | POA: Diagnosis not present

## 2022-04-05 DIAGNOSIS — K5900 Constipation, unspecified: Secondary | ICD-10-CM | POA: Diagnosis not present

## 2022-04-05 DIAGNOSIS — D509 Iron deficiency anemia, unspecified: Secondary | ICD-10-CM | POA: Diagnosis not present

## 2022-04-05 DIAGNOSIS — R339 Retention of urine, unspecified: Secondary | ICD-10-CM | POA: Diagnosis not present

## 2022-04-05 DIAGNOSIS — Z9181 History of falling: Secondary | ICD-10-CM | POA: Diagnosis not present

## 2022-04-08 DIAGNOSIS — R339 Retention of urine, unspecified: Secondary | ICD-10-CM | POA: Diagnosis not present

## 2022-04-08 DIAGNOSIS — Z7901 Long term (current) use of anticoagulants: Secondary | ICD-10-CM | POA: Diagnosis not present

## 2022-04-08 DIAGNOSIS — D509 Iron deficiency anemia, unspecified: Secondary | ICD-10-CM | POA: Diagnosis not present

## 2022-04-08 DIAGNOSIS — I10 Essential (primary) hypertension: Secondary | ICD-10-CM | POA: Diagnosis not present

## 2022-04-08 DIAGNOSIS — F32A Depression, unspecified: Secondary | ICD-10-CM | POA: Diagnosis not present

## 2022-04-08 DIAGNOSIS — Z96642 Presence of left artificial hip joint: Secondary | ICD-10-CM | POA: Diagnosis not present

## 2022-04-08 DIAGNOSIS — M80052D Age-related osteoporosis with current pathological fracture, left femur, subsequent encounter for fracture with routine healing: Secondary | ICD-10-CM | POA: Diagnosis not present

## 2022-04-08 DIAGNOSIS — Z9181 History of falling: Secondary | ICD-10-CM | POA: Diagnosis not present

## 2022-04-08 DIAGNOSIS — K5900 Constipation, unspecified: Secondary | ICD-10-CM | POA: Diagnosis not present

## 2022-04-13 ENCOUNTER — Other Ambulatory Visit: Payer: Self-pay | Admitting: Family Medicine

## 2022-04-14 DIAGNOSIS — K5900 Constipation, unspecified: Secondary | ICD-10-CM | POA: Diagnosis not present

## 2022-04-14 DIAGNOSIS — I10 Essential (primary) hypertension: Secondary | ICD-10-CM | POA: Diagnosis not present

## 2022-04-14 DIAGNOSIS — R339 Retention of urine, unspecified: Secondary | ICD-10-CM | POA: Diagnosis not present

## 2022-04-14 DIAGNOSIS — M80052D Age-related osteoporosis with current pathological fracture, left femur, subsequent encounter for fracture with routine healing: Secondary | ICD-10-CM | POA: Diagnosis not present

## 2022-04-14 DIAGNOSIS — Z7901 Long term (current) use of anticoagulants: Secondary | ICD-10-CM | POA: Diagnosis not present

## 2022-04-14 DIAGNOSIS — Z96642 Presence of left artificial hip joint: Secondary | ICD-10-CM | POA: Diagnosis not present

## 2022-04-14 DIAGNOSIS — F32A Depression, unspecified: Secondary | ICD-10-CM | POA: Diagnosis not present

## 2022-04-14 DIAGNOSIS — Z9181 History of falling: Secondary | ICD-10-CM | POA: Diagnosis not present

## 2022-04-14 DIAGNOSIS — D509 Iron deficiency anemia, unspecified: Secondary | ICD-10-CM | POA: Diagnosis not present

## 2022-04-16 DIAGNOSIS — F32A Depression, unspecified: Secondary | ICD-10-CM | POA: Diagnosis not present

## 2022-04-16 DIAGNOSIS — Z7901 Long term (current) use of anticoagulants: Secondary | ICD-10-CM | POA: Diagnosis not present

## 2022-04-16 DIAGNOSIS — I10 Essential (primary) hypertension: Secondary | ICD-10-CM | POA: Diagnosis not present

## 2022-04-16 DIAGNOSIS — K5900 Constipation, unspecified: Secondary | ICD-10-CM | POA: Diagnosis not present

## 2022-04-16 DIAGNOSIS — M80052D Age-related osteoporosis with current pathological fracture, left femur, subsequent encounter for fracture with routine healing: Secondary | ICD-10-CM | POA: Diagnosis not present

## 2022-04-16 DIAGNOSIS — Z9181 History of falling: Secondary | ICD-10-CM | POA: Diagnosis not present

## 2022-04-16 DIAGNOSIS — D509 Iron deficiency anemia, unspecified: Secondary | ICD-10-CM | POA: Diagnosis not present

## 2022-04-16 DIAGNOSIS — R339 Retention of urine, unspecified: Secondary | ICD-10-CM | POA: Diagnosis not present

## 2022-04-16 DIAGNOSIS — Z96642 Presence of left artificial hip joint: Secondary | ICD-10-CM | POA: Diagnosis not present

## 2022-04-22 DIAGNOSIS — I10 Essential (primary) hypertension: Secondary | ICD-10-CM | POA: Diagnosis not present

## 2022-04-22 DIAGNOSIS — K5900 Constipation, unspecified: Secondary | ICD-10-CM | POA: Diagnosis not present

## 2022-04-22 DIAGNOSIS — Z9181 History of falling: Secondary | ICD-10-CM | POA: Diagnosis not present

## 2022-04-22 DIAGNOSIS — F32A Depression, unspecified: Secondary | ICD-10-CM | POA: Diagnosis not present

## 2022-04-22 DIAGNOSIS — D509 Iron deficiency anemia, unspecified: Secondary | ICD-10-CM | POA: Diagnosis not present

## 2022-04-22 DIAGNOSIS — R339 Retention of urine, unspecified: Secondary | ICD-10-CM | POA: Diagnosis not present

## 2022-04-22 DIAGNOSIS — Z96642 Presence of left artificial hip joint: Secondary | ICD-10-CM | POA: Diagnosis not present

## 2022-04-22 DIAGNOSIS — Z7901 Long term (current) use of anticoagulants: Secondary | ICD-10-CM | POA: Diagnosis not present

## 2022-04-22 DIAGNOSIS — M80052D Age-related osteoporosis with current pathological fracture, left femur, subsequent encounter for fracture with routine healing: Secondary | ICD-10-CM | POA: Diagnosis not present

## 2022-05-04 ENCOUNTER — Ambulatory Visit: Payer: 59 | Admitting: Orthopaedic Surgery

## 2022-05-11 ENCOUNTER — Other Ambulatory Visit (HOSPITAL_COMMUNITY): Payer: Self-pay | Admitting: Family Medicine

## 2022-05-11 ENCOUNTER — Encounter: Payer: Self-pay | Admitting: Family Medicine

## 2022-05-11 ENCOUNTER — Other Ambulatory Visit: Payer: Self-pay | Admitting: Family Medicine

## 2022-05-11 ENCOUNTER — Ambulatory Visit (INDEPENDENT_AMBULATORY_CARE_PROVIDER_SITE_OTHER): Payer: 59 | Admitting: Family Medicine

## 2022-05-11 VITALS — BP 132/86 | HR 115 | Resp 16 | Ht 62.0 in | Wt 123.0 lb

## 2022-05-11 DIAGNOSIS — Z1231 Encounter for screening mammogram for malignant neoplasm of breast: Secondary | ICD-10-CM

## 2022-05-11 DIAGNOSIS — R110 Nausea: Secondary | ICD-10-CM

## 2022-05-11 DIAGNOSIS — I10 Essential (primary) hypertension: Secondary | ICD-10-CM

## 2022-05-11 DIAGNOSIS — G40309 Generalized idiopathic epilepsy and epileptic syndromes, not intractable, without status epilepticus: Secondary | ICD-10-CM

## 2022-05-11 DIAGNOSIS — F39 Unspecified mood [affective] disorder: Secondary | ICD-10-CM | POA: Diagnosis not present

## 2022-05-11 DIAGNOSIS — M810 Age-related osteoporosis without current pathological fracture: Secondary | ICD-10-CM

## 2022-05-11 DIAGNOSIS — R000 Tachycardia, unspecified: Secondary | ICD-10-CM | POA: Diagnosis not present

## 2022-05-11 MED ORDER — FLUTICASONE PROPIONATE 50 MCG/ACT NA SUSP: 2.0000 | Freq: Every day | NASAL | 6 refills | Status: DC

## 2022-05-11 MED ORDER — ONDANSETRON HCL 4 MG/2ML IJ SOLN
4.0000 mg | Freq: Once | INTRAMUSCULAR | Status: AC
Start: 2022-05-11 — End: 2022-05-11
  Administered 2022-05-11: 4 mg via INTRAMUSCULAR

## 2022-05-11 MED ORDER — AMLODIPINE BESYLATE 5 MG PO TABS: 5.0000 mg | ORAL_TABLET | Freq: Every day | ORAL | 3 refills | Status: DC

## 2022-05-11 MED ORDER — ONDANSETRON HCL 4 MG PO TABS: 4.0000 mg | ORAL_TABLET | Freq: Three times a day (TID) | ORAL | 0 refills | Status: DC | PRN

## 2022-05-11 NOTE — Patient Instructions (Addendum)
F/U in erly September,, call if you need me sooner  Zofran 4 mg Im in office for nausea and limited amount of tablets prescribed  Start flonase nose spray for allergies and continue the tablet you are taking   You are referred to Cardiology to assess rapid heart rate and blood pressure  Pls sched mammogram at checkout TSH, cmp and EGFR today and magnesium   Thanks for choosing [facility], we consider it a privelige to serve you.

## 2022-05-13 ENCOUNTER — Ambulatory Visit (INDEPENDENT_AMBULATORY_CARE_PROVIDER_SITE_OTHER): Payer: 59 | Admitting: Physician Assistant

## 2022-05-13 ENCOUNTER — Other Ambulatory Visit (INDEPENDENT_AMBULATORY_CARE_PROVIDER_SITE_OTHER): Payer: 59

## 2022-05-13 DIAGNOSIS — Z96642 Presence of left artificial hip joint: Secondary | ICD-10-CM

## 2022-05-13 LAB — CMP14+EGFR
ALT: 17 IU/L (ref 0–32)
AST: 24 IU/L (ref 0–40)
Albumin/Globulin Ratio: 1.2 (ref 1.2–2.2)
Albumin: 4.2 g/dL (ref 3.9–4.9)
Alkaline Phosphatase: 248 IU/L — ABNORMAL HIGH (ref 44–121)
BUN/Creatinine Ratio: 5 — ABNORMAL LOW (ref 12–28)
BUN: 3 mg/dL — ABNORMAL LOW (ref 8–27)
Bilirubin Total: 0.2 mg/dL (ref 0.0–1.2)
CO2: 22 mmol/L (ref 20–29)
Calcium: 10.5 mg/dL — ABNORMAL HIGH (ref 8.7–10.3)
Chloride: 98 mmol/L (ref 96–106)
Creatinine, Ser: 0.62 mg/dL (ref 0.57–1.00)
Globulin, Total: 3.4 g/dL (ref 1.5–4.5)
Glucose: 112 mg/dL — ABNORMAL HIGH (ref 70–99)
Potassium: 4.1 mmol/L (ref 3.5–5.2)
Sodium: 136 mmol/L (ref 134–144)
Total Protein: 7.6 g/dL (ref 6.0–8.5)
eGFR: 100 mL/min/{1.73_m2} (ref >59)

## 2022-05-13 LAB — TSH: TSH: 1.73 u[IU]/mL (ref 0.450–4.500)

## 2022-05-13 LAB — MAGNESIUM: Magnesium: 1.9 mg/dL (ref 1.6–2.3)

## 2022-05-13 NOTE — Progress Notes (Signed)
Post-Op Visit Note   Patient: Sandra Riley           Date of Birth: [birth year]           MRN: [id number] Visit Date: 05/13/2022 PCP: Kerri Perches, MD   Assessment & Plan:  Chief Complaint:  Chief Complaint  Patient presents with   Left Hip - Routine Post Op   Visit Diagnoses:  1. S/P total left hip arthroplasty     Plan: Patient is a pleasant 62 year old female who comes in today 3 months status post left total hip replacement from a femoral neck fracture, date of surgery 04/27/2021.  She has been doing well.  She notes slight discomfort to the left hip which is relieved with Tylenol.  She tells me she finished home health physical therapy 3 weeks ago.  She is ambulating with a rollator at home.  Left hip exam reveals a painless hip flexion and logroll.  She is neurovascularly intact distally.  At this point, she will continue to work on her home exercise program.  Advance with activity as tolerated.  Follow-up in 3 months for recheck.  Dental prophylaxis reinforced.  Follow-Up Instructions: Return in about 3 months (around 08/12/2022).   Orders:  Orders Placed This Encounter  Procedures   XR Pelvis 1-2 Views   No orders of the defined types were placed in this encounter.   Imaging: No results found.  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Family History  Problem Relation Age of Onset   Breast cancer Mother 6   Coronary artery disease Father    Stroke Father 95   Breast cancer Sister 4   Breast cancer Sister    Breast cancer Sister    Diabetes Brother    Heart attack Brother     Past Surgical History:  Procedure Laterality Date   ABDOMINAL HYSTERECTOMY     CESAREAN SECTION     CHOLECYSTECTOMY     COLONOSCOPY WITH PROPOFOL N/A 02/15/2019   Procedure: COLONOSCOPY WITH PROPOFOL;  Surgeon: Corbin Ade, MD;  [facility];  Service: Endoscopy;  Laterality: N/A;  2:45pm   POLYPECTOMY  02/15/2019   Procedure: POLYPECTOMY;  Surgeon: Corbin Ade, MD;  [facility];  Service: Endoscopy;;   TOTAL HIP ARTHROPLASTY Left 01/27/2022   Procedure: TOTAL HIP ARTHROPLASTY ANTERIOR APPROACH;  Surgeon: Tarry Kos, MD;  Location[facility];  Service: Orthopedics;  Laterality: Left;   Social History   Occupational  History   Occupation: disabled  Tobacco Use   Smoking status: Never   Smokeless tobacco: Never  Vaping Use   Vaping Use: Never used  Substance and Sexual Activity   Alcohol use: No   Drug use: No   Sexual activity: Yes    Birth control/protection: Surgical

## 2022-05-16 ENCOUNTER — Encounter: Payer: Self-pay | Admitting: Family Medicine

## 2022-05-16 DIAGNOSIS — R000 Tachycardia, unspecified: Secondary | ICD-10-CM | POA: Insufficient documentation

## 2022-05-16 DIAGNOSIS — R110 Nausea: Secondary | ICD-10-CM | POA: Insufficient documentation

## 2022-05-16 NOTE — Progress Notes (Signed)
   Sandra Riley     MRN: [id number]      DOB: 30-Oct-1960   HPI Sandra Riley is here for follow up and re-evaluation of chronic medical conditions, medication management and review of any available recent lab and radiology data.  Preventive health is updated, specifically  Cancer screening and Immunization.   Questions or concerns regarding consultations or procedures which the PT has had in the interim are  addressed. C/o nausea  C/o ROS Denies recent fever or chills. Denies sinus pressure, nasal congestion, ear pain or sore throat. Denies chest congestion, productive cough or wheezing. Denies chest pains, palpitations and leg swelling Denies abdominal pain,diarrhea or constipation.   Denies dysuria, frequency, hesitancy or incontinence. Denies joint pain, swelling and limitation in mobility. Denies headaches, seizures, numbness, or tingling. Denies uncontrolled depression, anxiety or insomnia. Denies skin break down or rash.   PE  BP 132/86   Pulse (!) 115   Resp 16   Ht 5\' 2"  (1.575 m)   Wt 123 lb (55.8 kg)   SpO2 97%   BMI 22.50 kg/m   Patient alert and oriented and in no cardiopulmonary distress.  HEENT: No facial asymmetry, EOMI,     Neck supple .  Chest: Clear to auscultation bilaterally.  CVS: S1, S2 no murmurs, no S3.Regular rate.  ABD: Soft non tender.   Ext: No edema  MS: Adequate ROM spine, shoulders, hips and knees.  Skin: Intact, no ulcerations or rash noted.  Psych: Good eye contact, normal affect. Memory intact not anxious or depressed appearing.  CNS: CN 2-12 intact, power,  normal throughout.no focal deficits noted.   Assessment & Plan  Tachycardia Persistent tachycardia, refer Cardiology  Essential hypertension DASH diet and commitment to daily physical activity for a minimum of 30 minutes discussed and encouraged, as a part of hypertension management. The importance of attaining a healthy weight is also discussed.     05/11/2022    10:06 AM 02/25/2022    9:53 AM 02/25/2022    9:26 AM 02/25/2022    9:21 AM 02/17/2022    1:07 PM 02/02/2022    7:27 AM 02/02/2022    3:56 AM  BP/Weight  Systolic BP 132 128 142 144 145 126 138  Diastolic BP 86 84 78 84 80 66 65  Wt. (Lbs) 123        BMI 22.5 kg/m2             Moderate mood disorder (HCC) Controlled, no change in medication   Generalized convulsive epilepsy (HCC) Controlled, no change in medication   Nausea Uncontrolled, zofran 4 mg IM , and limited about tabs as needed

## 2022-05-16 NOTE — Assessment & Plan Note (Signed)
DASH diet and commitment to daily physical activity for a minimum of 30 minutes discussed and encouraged, as a part of hypertension management. The importance of attaining a healthy weight is also discussed.     05/11/2022   10:06 AM 02/25/2022    9:53 AM 02/25/2022    9:26 AM 02/25/2022    9:21 AM 02/17/2022    1:07 PM 02/02/2022    7:27 AM 02/02/2022    3:56 AM  BP/Weight  Systolic BP 132 128 142 144 145 126 138  Diastolic BP 86 84 78 84 80 66 65  Wt. (Lbs) 123        BMI 22.5 kg/m2

## 2022-05-16 NOTE — Assessment & Plan Note (Signed)
Uncontrolled, zofran 4 mg IM , and limited about tabs as needed

## 2022-05-16 NOTE — Assessment & Plan Note (Signed)
Controlled, no change in medication  

## 2022-05-16 NOTE — Assessment & Plan Note (Signed)
Persistent tachycardia, refer Cardiology

## 2022-05-20 ENCOUNTER — Other Ambulatory Visit: Payer: Self-pay | Admitting: Internal Medicine

## 2022-05-20 ENCOUNTER — Encounter: Payer: Self-pay | Admitting: Internal Medicine

## 2022-05-20 ENCOUNTER — Ambulatory Visit (INDEPENDENT_AMBULATORY_CARE_PROVIDER_SITE_OTHER): Payer: 59

## 2022-05-20 ENCOUNTER — Ambulatory Visit: Payer: 59 | Attending: Internal Medicine | Admitting: Internal Medicine

## 2022-05-20 VITALS — BP 114/70 | HR 104 | Ht 62.0 in | Wt 124.6 lb

## 2022-05-20 DIAGNOSIS — I479 Paroxysmal tachycardia, unspecified: Secondary | ICD-10-CM | POA: Diagnosis not present

## 2022-05-20 DIAGNOSIS — R000 Tachycardia, unspecified: Secondary | ICD-10-CM | POA: Diagnosis not present

## 2022-05-20 DIAGNOSIS — I10 Essential (primary) hypertension: Secondary | ICD-10-CM | POA: Diagnosis not present

## 2022-05-20 DIAGNOSIS — R011 Cardiac murmur, unspecified: Secondary | ICD-10-CM | POA: Insufficient documentation

## 2022-05-20 NOTE — Patient Instructions (Addendum)
Medication Instructions:  Your physician recommends that you continue on your current medications as directed. Please refer to the Current Medication list given to you today.  Labwork: none  Testing/Procedures: Your physician has requested that you have an echocardiogram. Echocardiography is a painless test that uses sound waves to create images of your heart. It provides your doctor with information about the size and shape of your heart and how well your heart's chambers and valves are working. This procedure takes approximately one hour. There are no restrictions for this procedure. Please do NOT wear cologne, perfume, aftershave, or lotions (deodorant is allowed). Please arrive 15 minutes prior to your appointment time. Your physician has recommended that you wear a Zio monitor.   This monitor is a medical device that records the heart's electrical activity. Doctors most often use these monitors to diagnose arrhythmias. Arrhythmias are problems with the speed or rhythm of the heartbeat. The monitor is a small device applied to your chest. You can wear one while you do your normal daily activities. While wearing this monitor if you have any symptoms to push the button and record what you felt. Once you have worn this monitor for the period of time provider prescribed (for 7 days), you will return the monitor device in the postage paid box. Once it is returned they will download the data collected and provide [place] with a report which the provider will then review and we will call you with those results. Important tips:  Avoid showering during the first 24 hours of wearing the monitor. Avoid excessive sweating to help maximize wear time. Do not submerge the device, no hot tubs, and no swimming pools. Keep any lotions or oils away from the patch. After 24 hours you may shower with the patch on. Take brief showers with your back facing the shower head.  Do not remove patch once it has been placed  because that will interrupt data and decrease adhesive wear time. Push the button when you have any symptoms and write down what you were feeling. Once you have completed wearing your monitor, remove and place into box which has postage paid and place in your outgoing mailbox.  If for some reason you have misplaced your box then call our office and we can provide another box and/or mail it off for you.  Follow-Up: Your physician recommends that you schedule a follow-up appointment in: 3 months  Any Other Special Instructions Will Be Listed Below (If Applicable).  If you need a refill on your cardiac medications before your next appointment, please call your pharmacy.

## 2022-05-20 NOTE — Progress Notes (Signed)
Cardiology Office Note  Date: 05/20/2022   ID: Adreena, Willits March 25, 1960, MRN [id number]  PCP:  Kerri Perches, MD  Cardiologist:  Marjo Bicker, MD Electrophysiologist:  None   Reason for Office Visit: Evaluation of persistent tachycardia at the request of Dr. Lodema Hong   History of Present Illness: Sandra Riley is a 62 y.o. female known to have HTN, anxiety was referred to cardiology clinic for evaluation of persistent tachycardia.  Patient used to be active prior to the hip fracture in 01/2022, used to make 22 laps around her gym.  But after hip fracture, hip replacement surgery and undergoing physical therapy sessions, she is currently able to do 1 lap and slowly trying to catch her.  Not much active due to the hip fracture recently.  Denies any chest pain, SOB, palpitations, syncope.  She only gets fluttering of her heart when she gets scared or excited.  Otherwise denied any palpitations, skipped a beat or getting a beat feeling.  Past Medical History:  Diagnosis Date   Abnormal facial hair 05/11/2011   Anxiety and depression    GAD (generalized anxiety disorder)    Insomnia    Psychotic disorder    Seizure disorder    stress related seizures. 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Past Surgical History:  Procedure Laterality Date   ABDOMINAL HYSTERECTOMY     CESAREAN SECTION     CHOLECYSTECTOMY     COLONOSCOPY WITH PROPOFOL N/A 02/15/2019   Procedure: COLONOSCOPY WITH PROPOFOL;  Surgeon: Corbin Ade, MD;  Location: [facility];  Service: Endoscopy;  Laterality: N/A;  2:45pm   POLYPECTOMY  02/15/2019   Procedure: POLYPECTOMY;  Surgeon: Corbin Ade, MD;  Location: [facility];  Service: Endoscopy;;   TOTAL HIP ARTHROPLASTY Left 01/27/2022   Procedure: TOTAL HIP ARTHROPLASTY ANTERIOR APPROACH;  Surgeon: Tarry Kos, MD;  Location: [facility];  Service: Orthopedics;  Laterality: Left;    Current Outpatient Medications   Medication Sig Dispense Refill   acetaminophen (TYLENOL) 500 MG tablet Take 1,000 mg by mouth every 6 (six) hours as needed for moderate pain.     alendronate (FOSAMAX) 35 MG tablet TAKE ONE TABLET BY MOUTH ONCE A WEEK IN THE MORNING WITH A FULL GLASS OF WATER ON AN EMPTY STOMACH. DO NOT LAY DOWN FOR 30 MINUTES. 5 tablet 5   amLODipine (NORVASC) 5 MG tablet Take 1 tablet (5 mg total) by mouth daily. 30 tablet 3   Ascorbic Acid (VITAMIN C PO) Take 1 tablet by mouth daily.     BIOTIN 5000 PO Take 5,000 mcg by mouth once. daily     Calcium Carb-Cholecalciferol 1000-800 MG-UNIT TABS Take 1 tablet by mouth 3 (three) times daily.      Cholecalciferol (VITAMIN D) 50 MCG (2000 UT) CAPS Take 1 capsule by mouth daily.     clonazePAM (KLONOPIN) 1 MG tablet Take 1 tablet (1 mg total) by mouth 2 (two) times daily. 60 tablet 5   ferrous sulfate 325 (65 FE) MG tablet Take 325 mg by mouth once. daily     Humidifier MISC Large humidifier. 1 each 0   levETIRAcetam (KEPPRA) 750 MG tablet Take 2 tablets (1,500 mg total) by mouth 2 (two) times daily. 360 tablet 3   MAGNESIUM PO Take 500 mg by mouth daily.      mirtazapine (REMERON) 45 MG tablet Take 1 tablet (45 mg total) by mouth  at bedtime. 30 tablet 5   Multiple Vitamin (MULTIVITAMIN) tablet Take 1 tablet by mouth daily.     ondansetron (ZOFRAN) 4 MG tablet Take 1 tablet (4 mg total) by mouth every 8 (eight) hours as needed for nausea or vomiting. 20 tablet 0   PHENObarbital (LUMINAL) 100 MG tablet Take 2 tabs Monday-Friday and 1 tab Saturday/Sunday 180 tablet 1   Potassium 99 MG TABS Take 99 mg by mouth daily.      spironolactone (ALDACTONE) 50 MG tablet Take 1 tablet (50 mg total) by mouth daily. 90 tablet 3   zinc gluconate 50 MG tablet Take 50 mg by mouth daily.     No current facility-administered medications for this visit.   Allergies:  Aspirin, Nsaids, and Singulair [montelukast sodium]   Social History: The patient  reports that she has never  smoked. She has never used smokeless tobacco. She reports that she does not drink alcohol and does not use drugs.   Family History: The patient's family history includes Breast cancer in her sister and sister; Breast cancer (age of onset: 11) in her mother; Breast cancer (age of onset: 70) in her sister; Coronary artery disease in her father; Diabetes in her brother; Heart attack in her brother; Stroke (age of onset: 89) in her father.   ROS:  Please see the history of present illness. Otherwise, complete review of systems is positive for none  All other systems are reviewed and negative.   Physical Exam: VS:  BP 114/70   Pulse (!) 104   Ht  (1.575 m)   Wt 124 lb 9.6 oz (56.5 kg)   SpO2 96%   BMI 22.79 kg/m , BMI Body mass index is 22.79 kg/m.  Wt Readings from Last 3 Encounters:  05/20/22 124 lb 9.6 oz (56.5 kg)  05/11/22 123 lb (55.8 kg)  01/27/22 134 lb (60.8 kg)    General: Patient appears comfortable at rest. HEENT: Conjunctiva and lids normal, oropharynx clear with moist mucosa. Neck: Supple, no elevated JVP or carotid bruits, no thyromegaly. Lungs: Clear to auscultation, nonlabored breathing at rest. Cardiac: Regular rate and rhythm, holosystolic murmur, Grade 2-3/6 Abdomen: Soft, nontender, no hepatomegaly, bowel sounds present, no guarding or rebound. Extremities: No pitting edema, distal pulses 2+. Skin: Warm and dry. Musculoskeletal: No kyphosis. Neuropsychiatric: Alert and oriented x3, affect grossly appropriate.  Recent Labwork: 02/02/2022: Hemoglobin 9.5; Platelets 396 05/11/2022: ALT 17; AST 24; BUN 3; Creatinine, Ser 0.62; Magnesium 1.9; Potassium 4.1; Sodium 136; TSH 1.730     Component Value Date/Time   CHOL 174 12/09/2021 1142   TRIG 72 12/09/2021 1142   HDL 64 12/09/2021 1142   CHOLHDL 2.7 12/09/2021 1142   CHOLHDL 2.8 05/15/2019 0949   VLDL 17 01/07/2016 1153   LDLCALC 96 12/09/2021 1142   LDLCALC 86 05/15/2019 0949    Other Studies Reviewed  Today:   Assessment and Plan:  Patient is a 62 year old F known to have HTN, anxiety was referred to cardiology clinic for evaluation of persistent tachycardia.  # Sinus tachycardia likely secondary to deconditioning: Patient does have HR more than 90 BPM on orthostatic vitals but did not meet criteria for orthostatic hypotension and POTS.  EKG today showed normal sinus rhythm, HR 87 bpm.  I will obtain 1 week event monitor to correlate symptoms with the sinus tachycardia.  If she has symptoms, will start metoprolol tartrate 25 mg twice daily and decrease amlodipine dose from 5 mg to 2.5 mg once daily. # Holosystolic  murmur,Grade 2-3/6: This could be innocent murmur versus mild regurgitation across the valve. Will obtain 2D echocardiogram. # HTN, controlled: Continue amlodipine 5 mg once daily and spironolactone 50 mg once daily, HTN management per PCP.   I have spent a total of 45 minutes with patient reviewing chart, EKGs, labs and examining patient as well as establishing an assessment and plan that was discussed with the patient.  > 50% of time was spent in direct patient care.    Medication Adjustments/Labs and Tests Ordered: Current medicines are reviewed at length with the patient today.  Concerns regarding medicines are outlined above.   Tests Ordered: Orders Placed This Encounter  Procedures   EKG 12-Lead   ECHOCARDIOGRAM COMPLETE    Medication Changes: No orders of the defined types were placed in this encounter.   Disposition:  Follow up  3 months  Signed Jourdan Maldonado Verne Spurr, MD, 05/20/2022 3:59 PM    [facility] [address] [place], [place], [place] [address]

## 2022-05-21 ENCOUNTER — Telehealth: Payer: Self-pay | Admitting: Internal Medicine

## 2022-05-21 DIAGNOSIS — S72042D Displaced fracture of base of neck of left femur, subsequent encounter for closed fracture with routine healing: Secondary | ICD-10-CM | POA: Diagnosis not present

## 2022-05-21 DIAGNOSIS — R2689 Other abnormalities of gait and mobility: Secondary | ICD-10-CM | POA: Diagnosis not present

## 2022-05-21 DIAGNOSIS — M6281 Muscle weakness (generalized): Secondary | ICD-10-CM | POA: Diagnosis not present

## 2022-05-21 DIAGNOSIS — Z9181 History of falling: Secondary | ICD-10-CM | POA: Diagnosis not present

## 2022-05-21 NOTE — Telephone Encounter (Signed)
Patient states that she spoke to someone this morning who told her that "some test was ok." Informed patient that the call could possibly be in regards to her echo being approved by her insurance since there was no documentation of a telephone call in her chart. Patient verbalized understanding.

## 2022-05-21 NOTE — Telephone Encounter (Signed)
05/21/22 Returning a call to Sandra Riley. Mailbox is full -unable to leave a message Received message from call center that patient was returning a call about an upcoming test and authorization.

## 2022-05-21 NOTE — Telephone Encounter (Signed)
Patient returned staff call. 

## 2022-05-21 NOTE — Telephone Encounter (Signed)
Spoke to patient regarding this. States that she was given the ok to have test done.

## 2022-05-26 ENCOUNTER — Other Ambulatory Visit: Payer: Self-pay | Admitting: Internal Medicine

## 2022-05-26 DIAGNOSIS — I10 Essential (primary) hypertension: Secondary | ICD-10-CM

## 2022-05-26 DIAGNOSIS — R011 Cardiac murmur, unspecified: Secondary | ICD-10-CM

## 2022-05-26 DIAGNOSIS — R000 Tachycardia, unspecified: Secondary | ICD-10-CM

## 2022-05-27 ENCOUNTER — Telehealth: Payer: Self-pay | Admitting: Internal Medicine

## 2022-05-27 NOTE — Telephone Encounter (Signed)
Patient is wearing and a heart monitor. Patient is supposed to take her heart monitor off today, but she is having issues taking the monitor off. Please advise.

## 2022-05-27 NOTE — Telephone Encounter (Signed)
Patient states that her cousin is going to come to her home tomorrow and help remove the monitor. Instructed the patient to call or come by the office if she is unable to remove monitor. Verbalized understanding.

## 2022-06-01 DIAGNOSIS — R002 Palpitations: Secondary | ICD-10-CM | POA: Diagnosis not present

## 2022-06-15 ENCOUNTER — Ambulatory Visit: Payer: 59 | Attending: Internal Medicine

## 2022-06-15 DIAGNOSIS — R011 Cardiac murmur, unspecified: Secondary | ICD-10-CM

## 2022-06-15 DIAGNOSIS — R000 Tachycardia, unspecified: Secondary | ICD-10-CM | POA: Diagnosis not present

## 2022-06-16 LAB — ECHOCARDIOGRAM COMPLETE
AV Mean grad: 7 mmHg
AV Peak grad: 12.4 mmHg
Ao pk vel: 1.76 m/s
Area-P 1/2: 2.01 cm2
Calc EF: 79.2 %
Est EF: >75
MV M vel: 2.59 m/s
MV Peak grad: 26.8 mmHg
S' Lateral: 1.2 cm
Single Plane A2C EF: 77.4 %
Single Plane A4C EF: 81.2 %

## 2022-06-20 DIAGNOSIS — M6281 Muscle weakness (generalized): Secondary | ICD-10-CM | POA: Diagnosis not present

## 2022-06-20 DIAGNOSIS — S72042D Displaced fracture of base of neck of left femur, subsequent encounter for closed fracture with routine healing: Secondary | ICD-10-CM | POA: Diagnosis not present

## 2022-06-20 DIAGNOSIS — R2689 Other abnormalities of gait and mobility: Secondary | ICD-10-CM | POA: Diagnosis not present

## 2022-06-20 DIAGNOSIS — Z9181 History of falling: Secondary | ICD-10-CM | POA: Diagnosis not present

## 2022-06-22 ENCOUNTER — Telehealth: Payer: Self-pay | Admitting: Internal Medicine

## 2022-06-22 ENCOUNTER — Telehealth: Payer: Self-pay | Admitting: *Deleted

## 2022-06-22 DIAGNOSIS — I10 Essential (primary) hypertension: Secondary | ICD-10-CM

## 2022-06-22 MED ORDER — AMLODIPINE BESYLATE 2.5 MG PO TABS: 2.5000 mg | ORAL_TABLET | Freq: Every day | ORAL | 2 refills | Status: DC

## 2022-06-22 MED ORDER — METOPROLOL TARTRATE 25 MG PO TABS: 25.0000 mg | ORAL_TABLET | Freq: Two times a day (BID) | ORAL | 2 refills | Status: DC

## 2022-06-22 NOTE — Telephone Encounter (Signed)
Patient informed and verbalized understanding of plan. Copy sent to PCP 

## 2022-06-22 NOTE — Telephone Encounter (Signed)
-----   Message from Marjo Bicker, MD sent at 06/15/2022 11:24 AM EDT ----- Symptoms correlated with sinus tachycardia, will start metoprolol tartrate 25 mg twice daily and decrease the dose of amlodipine from 5 mg to 2.5 mg once daily.

## 2022-06-22 NOTE — Telephone Encounter (Signed)
New Message:    Patient said she talked to Sandra Riley earlier today about her results. She said dhe needs to talk to her again, about  all her medicine please.

## 2022-06-23 NOTE — Telephone Encounter (Signed)
Patient is calling back to follow up on Lydia returning her call due to not hearing back.   Please advise.

## 2022-06-23 NOTE — Telephone Encounter (Signed)
Expressed interest in switching to [facility] so prescriptions cold be mailed to her home address. Explained process in detailed. Says she will contact our office if she decides to switch from [facility] to [facility].

## 2022-07-06 ENCOUNTER — Telehealth: Payer: Self-pay | Admitting: Internal Medicine

## 2022-07-06 ENCOUNTER — Other Ambulatory Visit: Payer: Self-pay

## 2022-07-06 ENCOUNTER — Telehealth: Payer: Self-pay | Admitting: Neurology

## 2022-07-06 ENCOUNTER — Other Ambulatory Visit: Payer: Self-pay | Admitting: Internal Medicine

## 2022-07-06 ENCOUNTER — Telehealth: Payer: Self-pay | Admitting: Family Medicine

## 2022-07-06 DIAGNOSIS — F39 Unspecified mood [affective] disorder: Secondary | ICD-10-CM

## 2022-07-06 DIAGNOSIS — M810 Age-related osteoporosis without current pathological fracture: Secondary | ICD-10-CM

## 2022-07-06 MED ORDER — ALENDRONATE SODIUM 35 MG PO TABS
ORAL_TABLET | ORAL | 5 refills | Status: DC
Start: 2022-07-06 — End: 2023-03-28

## 2022-07-06 MED ORDER — AMLODIPINE BESYLATE 2.5 MG PO TABS: 2.5000 mg | ORAL_TABLET | Freq: Every day | ORAL | 1 refills | Status: DC

## 2022-07-06 MED ORDER — METOPROLOL TARTRATE 25 MG PO TABS: 25.0000 mg | ORAL_TABLET | Freq: Two times a day (BID) | ORAL | 1 refills | Status: DC

## 2022-07-06 MED ORDER — CLONAZEPAM 1 MG PO TABS: 1.0000 mg | ORAL_TABLET | Freq: Two times a day (BID) | ORAL | 0 refills | Status: DC

## 2022-07-06 MED ORDER — LEVETIRACETAM 750 MG PO TABS: 1500.0000 mg | ORAL_TABLET | Freq: Two times a day (BID) | ORAL | 3 refills | Status: DC

## 2022-07-06 MED ORDER — MIRTAZAPINE 45 MG PO TABS: 45.0000 mg | ORAL_TABLET | Freq: Every day | ORAL | 5 refills | Status: DC

## 2022-07-06 MED ORDER — PHENOBARBITAL 100 MG PO TABS: ORAL_TABLET | ORAL | 1 refills | Status: DC

## 2022-07-06 NOTE — Telephone Encounter (Signed)
*  STAT* If patient is at the pharmacy, call can be transferred to refill team.   1. Which medications need to be refilled? (please list name of each medication and dose if known) amLODipine (NORVASC) 2.5 MG tablet   metoprolol tartrate (LOPRESSOR) 25 MG tablet   2. Which pharmacy/location (including street and city if local pharmacy) is medication to be sent to?  [facility] - [place], [place] - [address]    3. Do they need a 30 day or 90 day supply? 90

## 2022-07-06 NOTE — Telephone Encounter (Signed)
No voice mail.

## 2022-07-06 NOTE — Telephone Encounter (Signed)
Done. Thanks.

## 2022-07-06 NOTE — Telephone Encounter (Signed)
Done

## 2022-07-06 NOTE — Telephone Encounter (Signed)
Pt requesting refill on PHENObarbital (LUMINAL) 100 MG table and  levETIRAcetam (KEPPRA) 750 MG tablet. Please send to  [facility]  Stated her other pharmacy just up and closed.

## 2022-07-06 NOTE — Telephone Encounter (Signed)
Requested Prescriptions   Pending Prescriptions Disp Refills   PHENObarbital (LUMINAL) 100 MG tablet 180 tablet 1    Sig: Take 2 tabs Monday-Friday and 1 tab Saturday/Sunday   levETIRAcetam (KEPPRA) 750 MG tablet 360 tablet 3    Sig: Take 2 tablets (1,500 mg total) by mouth 2 (two) times daily.   Last seen 02/17/22, next appt scheduled for 11/18/22  Dispenses   Dispensed Days Supply Quantity Provider Pharmacy  phenobarbital 100 mg tablet 06/08/2022 28 48 each Windell Norfolk, MD [facility]  phenobarbital 100 mg tablet 06/01/2022 9 15 each Windell Norfolk, MD [facility]  phenobarbital 100 mg tablet 05/11/2022 28 48 each Windell Norfolk, MD [facility]  phenobarbital 100 mg tablet 04/13/2022 28 48 each Windell Norfolk, MD [facility]  phenobarbital 100 mg tablet 03/19/2022 28 48 each Windell Norfolk, MD [facility]  phenobarbital 100 mg tablet 02/17/2022 30 60 each Beryle Beams, MD [facility]  PHENOBARBITAL 100MG  TABLETS 02/03/2022 2 4 each Hasanaj, Myra Gianotti, MD [facility]...  phenobarbital 100 mg tablet 01/16/2022 30 60 each Beryle Beams, MD [facility]  phenobarbital 100 mg tablet 12/18/2021 30 60 each Beryle Beams, MD [facility]  phenobarbital 100 mg tablet 11/17/2021 30 60 each Beryle Beams, MD [facility]  phenobarbital 97.2 mg tablet 11/11/2021 10 20 each Jorge Mandril, NP [facility]  phenobarbital 97.2 mg tablet 10/12/2021 30 60 each Jorge Mandril, NP [facility]  phenobarbital 100 mg tablet 10/09/2021 7 14 each Beryle Beams, MD [facility]  phenobarbital 97.2 mg tablet 09/11/2021 30 60 each Jorge Mandril, NP [facility]  phenobarbital 97.2 mg tablet 08/11/2021 30 60 each Jorge Mandril, NP [facility]  PHENOBARBITAL  100 MG TABS 08/10/2021 3 6 tablet Jorge Mandril, NP [facility]  phenobarbital 100 mg tablet 08/10/2021 30 60 each Jorge Mandril, NP [facility]  phenobarbital 100 mg tablet 07/11/2021 30 60 each Jorge Mandril, NP [facility]Marland Kitchen

## 2022-07-06 NOTE — Telephone Encounter (Signed)
Patient called said [facility] is closing needs to update pharmacy to  [facility].  Update patient file to [facility].  mirtazapine (REMERON) 45 MG tablet [[id number]]   clonazePAM (KLONOPIN) 1 MG tablet [[id number]]   alendronate (FOSAMAX) 35 MG tablet [[id number]]    Pharmacy: [facility]

## 2022-07-08 ENCOUNTER — Ambulatory Visit (INDEPENDENT_AMBULATORY_CARE_PROVIDER_SITE_OTHER): Payer: 59

## 2022-07-08 DIAGNOSIS — Z0000 Encounter for general adult medical examination without abnormal findings: Secondary | ICD-10-CM

## 2022-07-08 NOTE — Progress Notes (Signed)
Subjective:   Sandra Riley is a 62 y.o. female who presents for Medicare Annual (Subsequent) preventive examination.  Review of Systems    I connected with  Jonathan Dangel on 07/08/22 by a audio enabled telemedicine application and verified that I am speaking with the correct person using two identifiers.  Patient Location: Home  Provider Location: Office/Clinic  I discussed the limitations of evaluation and management by telemedicine. The patient expressed understanding and agreed to proceed.        Objective:    There were no vitals filed for this visit. There is no height or weight on file to calculate BMI.     01/31/2022    8:25 PM 01/26/2022    2:01 AM 01/28/2021    9:52 AM 01/24/2020    2:58 PM 02/15/2019    1:10 PM 02/14/2019    2:42 PM 09/19/2017   10:57 AM  Advanced Directives  Does Patient Have a Medical Advance Directive? No No No No No No Yes;No  Does patient want to make changes to medical advance directive?       Yes (ED - Information included in AVS)  Would patient like information on creating a medical advance directive? No - Patient declined No - Patient declined Yes (ED - Information included in AVS) No - Patient declined No - Patient declined No - Patient declined     Current Medications (verified) Outpatient Encounter Medications as of 07/08/2022  Medication Sig   acetaminophen (TYLENOL) 500 MG tablet Take 1,000 mg by mouth every 6 (six) hours as needed for moderate pain.   alendronate (FOSAMAX) 35 MG tablet TAKE ONE TABLET BY MOUTH ONCE A WEEK IN THE MORNING WITH A FULL GLASS OF WATER ON AN EMPTY STOMACH. DO NOT LAY DOWN FOR 30 MINUTES.   amLODipine (NORVASC) 2.5 MG tablet Take 1 tablet (2.5 mg total) by mouth daily.   Ascorbic Acid (VITAMIN C PO) Take 1 tablet by mouth daily.   BIOTIN 5000 PO Take 5,000 mcg by mouth once. daily   Calcium Carb-Cholecalciferol 1000-800 MG-UNIT TABS Take 1 tablet by mouth 3 (three) times daily.    Cholecalciferol  (VITAMIN D) 50 MCG (2000 UT) CAPS Take 1 capsule by mouth daily.   clonazePAM (KLONOPIN) 1 MG tablet Take 1 tablet (1 mg total) by mouth 2 (two) times daily.   ferrous sulfate 325 (65 FE) MG tablet Take 325 mg by mouth once. daily   Humidifier MISC Large humidifier.   levETIRAcetam (KEPPRA) 750 MG tablet Take 2 tablets (1,500 mg total) by mouth 2 (two) times daily.   MAGNESIUM PO Take 500 mg by mouth daily.    metoprolol tartrate (LOPRESSOR) 25 MG tablet Take 1 tablet (25 mg total) by mouth 2 (two) times daily.   mirtazapine (REMERON) 45 MG tablet Take 1 tablet (45 mg total) by mouth at bedtime.   Multiple Vitamin (MULTIVITAMIN) tablet Take 1 tablet by mouth daily.   ondansetron (ZOFRAN) 4 MG tablet Take 1 tablet (4 mg total) by mouth every 8 (eight) hours as needed for nausea or vomiting.   PHENObarbital (LUMINAL) 100 MG tablet Take 2 tabs Monday-Friday and 1 tab Saturday/Sunday   Potassium 99 MG TABS Take 99 mg by mouth daily.    spironolactone (ALDACTONE) 50 MG tablet Take 1 tablet (50 mg total) by mouth daily.   zinc gluconate 50 MG tablet Take 50 mg by mouth daily.   No facility-administered encounter medications on file as of 07/08/2022.  Allergies (verified) Aspirin, Nsaids, and Singulair [montelukast sodium]   History: Past Medical History:  Diagnosis Date   Abnormal facial hair 05/11/2011   Anxiety and depression    GAD (generalized anxiety disorder)    Insomnia    Psychotic disorder (HCC)    Seizure disorder (HCC)    stress related seizures. 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[facility])    Past Surgical History:  Procedure Laterality Date   ABDOMINAL HYSTERECTOMY     CESAREAN SECTION     CHOLECYSTECTOMY     COLONOSCOPY WITH PROPOFOL N/A 02/15/2019   Procedure: COLONOSCOPY WITH PROPOFOL;  Surgeon: Corbin Ade, MD;  [facility];  Service: Endoscopy;  Laterality: N/A;  2:45pm   POLYPECTOMY  02/15/2019   Procedure: POLYPECTOMY;  Surgeon:  Corbin Ade, MD;  Location: [facility];  Service: Endoscopy;;   TOTAL HIP ARTHROPLASTY Left 01/27/2022   Procedure: TOTAL HIP ARTHROPLASTY ANTERIOR APPROACH;  Surgeon: Tarry Kos, MD;  Location: [facility] OR;  Service: Orthopedics;  Laterality: Left;   Family History  Problem Relation Age of Onset   Breast cancer Mother 31   Coronary artery disease Father    Stroke Father 60   Breast cancer Sister 110   Breast cancer Sister    Breast cancer Sister    Diabetes Brother    Heart attack Brother    Social History   Socioeconomic History   Marital status: Married    Spouse name: Aurther Loft   Number of children: 3   Years of education: 12th   Highest education level: 12th grade  Occupational History   Occupation: disabled  Tobacco Use   Smoking status: Never   Smokeless tobacco: Never  Vaping Use   Vaping Use: Never used  Substance and Sexual Activity   Alcohol use: No   Drug use: No   Sexual activity: Yes    Birth control/protection: Surgical  Other Topics Concern   Not on file  Social History Narrative   Not on file   Social Determinants of Health   Financial Resource Strain: Low Risk  (01/28/2021)   Overall Financial Resource Strain (CARDIA)    Difficulty of Paying Living Expenses: Not hard at all  Recent Concern: Financial Resource Strain - Medium Risk (11/13/2020)   Overall Financial Resource Strain (CARDIA)    Difficulty of Paying Living Expenses: Somewhat hard  Food Insecurity: No Food Insecurity (01/31/2022)   Hunger Vital Sign    Worried About Running Out of Food in the Last Year: Never true    Ran Out of Food in the Last Year: Never true  Transportation Needs: No Transportation Needs (01/31/2022)   PRAPARE - Administrator, Civil Service (Medical): No    Lack of Transportation (Non-Medical): No  Physical Activity: Insufficiently Active (11/13/2020)   Exercise Vital Sign    Days of Exercise per Week: 3 days    Minutes of Exercise per Session: 30  min  Stress: Stress Concern Present (06/19/2021)   [employer] of Occupational Health - Occupational Stress Questionnaire    Feeling of Stress : Rather much  Social Connections: Moderately Integrated (01/28/2021)   Social Connection and Isolation Panel [NHANES]    Frequency of Communication with Friends and Family: More than three times a week    Frequency of Social Gatherings with Friends and Family: Once a week    Attends Religious Services: More than 4 times per year    Active Member of  Clubs or Organizations: No    Attends Banker Meetings: Never    Marital Status: Married    Tobacco Counseling Counseling given: Not Answered   Clinical Intake:  Diabetic?no  Activities of Daily Living    01/31/2022    8:25 PM  In your present state of health, do you have any difficulty performing the following activities:  Hearing? 0  Vision? 0  Difficulty concentrating or making decisions? 0  Walking or climbing stairs? 0  Dressing or bathing? 0  Doing errands, shopping? 1    Patient Care Team: Kerri Perches, MD as PCP - General Mallipeddi, Orion Modest, MD as PCP - Cardiology (Cardiology) Beryle Beams, MD as Consulting Physician (Neurology) Randa Spike Kelton Pillar, LCSW as [facility] Care Management (Licensed Clinical Social Worker)  Indicate any recent Medical Services you may have received from other than [facility] providers in the past year (date may be approximate).     Assessment:   This is a routine wellness examination for [facility].  Hearing/Vision screen No results found.  Dietary issues and exercise activities discussed:     Goals Addressed   None   Depression Screen    05/11/2022   10:10 AM 02/25/2022    9:24 AM 12/09/2021   10:16 AM 07/17/2021   10:26 AM 06/19/2021   11:05 AM 04/10/2021   11:15 AM 03/26/2021   11:44 AM  PHQ 2/9 Scores  PHQ - 2 Score 0 0 2 2 3 4 5   PHQ- 9 Score 4  3 6 10 13 9     Fall Risk    05/11/2022   10:10 AM  02/25/2022    9:24 AM 12/09/2021   10:15 AM 07/17/2021   10:00 AM 03/26/2021   11:44 AM  Fall Risk   Falls in the past year? 1 1 1  0 0  Number falls in past yr: 0 0 0 0 0  Injury with Fall? 0 1 0 0 0  Risk for fall due to :  History of fall(s);Impaired balance/gait History of fall(s)  No Fall Risks  Follow up  Falls evaluation completed Falls evaluation completed  Falls evaluation completed    FALL RISK PREVENTION PERTAINING TO THE HOME:  Any stairs in or around the home? No  If so, are there any without handrails? No  Home free of loose throw rugs in walkways, pet beds, electrical cords, etc? Yes  Adequate lighting in your home to reduce risk of falls? yes  ASSISTIVE DEVICES UTILIZED TO PREVENT FALLS:  Life alert? No  Use of a cane, walker or w/c? Yes  Grab bars in the bathroom? Yes  Shower chair or bench in shower? Yes  Elevated toilet seat or a handicapped toilet? Yes    Cognitive Function:        01/28/2021    9:56 AM 01/24/2020    3:01 PM 09/21/2018   10:16 AM 09/19/2017   10:58 AM 09/20/2016    1:30 PM  6CIT Screen  What Year? 0 points 0 points 0 points 0 points 0 points  What month? 0 points 0 points 0 points 0 points 0 points  What time? 0 points 0 points 0 points 0 points 0 points  Count back from 20 0 points 0 points 0 points 0 points 0 points  Months in reverse  0 points 0 points 0 points 0 points  Repeat phrase 6 points 2 points 0 points 4 points 0 points  Total Score  2 points 0 points  4 points 0 points    Immunizations Immunization History  Administered Date(s) Administered   H1N1 12/05/2007   Influenza Split 12/15/2010, 11/23/2011, 12/02/2014   Influenza Whole 12/20/2006, 12/10/2009   Influenza, High Dose Seasonal PF 10/13/2020   Influenza,inj,Quad PF,6+ Mos 12/04/2012, 10/16/2021   Influenza-Unspecified 11/06/2019   Moderna Covid-19 Vaccine Bivalent Booster 27yrs & up 12/24/2020, 12/11/2021   Moderna SARS-COV2 Booster Vaccination 07/22/2020    Moderna Sars-Covid-2 Vaccination 04/23/2019, 05/21/2019, 12/05/2019, 10/27/2020   PNEUMOCOCCAL CONJUGATE-20 02/20/2020   Pneumococcal Conjugate-13 03/12/2014   Pneumococcal Polysaccharide-23 03/03/2009   Rsv, Bivalent, Protein Subunit Rsvpref,pf Verdis Frederickson) 12/11/2021   Td 02/01/2001   Td (Adult),5 Lf Tetanus Toxid, Preservative Free 11/25/1997   Tdap 09/14/2010   Zoster Recombinat (Shingrix) 09/27/2018, 01/17/2019    TDAP status: Due, Education has been provided regarding the importance of this vaccine. Advised may receive this vaccine at local pharmacy or Health Dept. Aware to provide a copy of the vaccination record if obtained from local pharmacy or Health Dept. Verbalized acceptance and understanding.  Flu Vaccine status: Up to date    Covid-19 vaccine status: Completed vaccines  Qualifies for Shingles Vaccine? Yes   Zostavax completed Yes   Shingrix Completed?: Yes  Screening Tests Health Maintenance  Topic Date Due   DTaP/Tdap/Td (3 - Td or Tdap) 09/13/2020   COVID-19 Vaccine (7 - 2023-24 season) 02/05/2022   INFLUENZA VACCINE  09/02/2022   Medicare Annual Wellness (AWV)  07/08/2023   MAMMOGRAM  07/30/2023   PAP SMEAR-Modifier  10/22/2023   Colonoscopy  02/14/2029   Hepatitis C Screening  Completed   HIV Screening  Completed   Zoster Vaccines- Shingrix  Completed   HPV VACCINES  Aged Out    Health Maintenance  Health Maintenance Due  Topic Date Due   DTaP/Tdap/Td (3 - Td or Tdap) 09/13/2020   COVID-19 Vaccine (7 - 2023-24 season) 02/05/2022    Colorectal cancer screening: Type of screening: Colonoscopy. Completed 02/15/19. Repeat every 10 years  Mammogram status: Completed 07/29/21. Repeat every year    Lung Cancer Screening: (Low Dose CT Chest recommended if Age 33-80 years, 30 pack-year currently smoking OR have quit w/in 15years.) does not qualify.   Lung Cancer Screening Referral: no  Additional Screening:  Hepatitis C Screening:does qualify;  Completed 02/07/21  Vision Screening: Recommended annual ophthalmology exams for early detection of glaucoma and other disorders of the eye. Is the patient up to date with their annual eye exam?  Yes  Who is the provider or what is the name of the office in which the patient attends annual eye exams? N/a If pt is not established with a provider, would they like to be referred to a provider to establish care? No .   Dental Screening: Recommended annual dental exams for proper oral hygiene  Community Resource Referral / Chronic Care Management: CRR required this visit?  No   CCM required this visit?  No      Plan:     I have personally reviewed and noted the following in the patient's chart:   Medical and social history Use of alcohol, tobacco or illicit drugs  Current medications and supplements including opioid prescriptions. Patient is not currently taking opioid prescriptions. Functional ability and status Nutritional status Physical activity Advanced directives List of other physicians Hospitalizations, surgeries, and ER visits in previous 12 months Vitals Screenings to include cognitive, depression, and falls Referrals and appointments  In addition, I have reviewed and discussed with patient certain preventive protocols, quality metrics, and  best practice recommendations. A written personalized care plan for preventive services as well as general preventive health recommendations were provided to patient.     Jasper Riling, CMA   07/08/2022

## 2022-07-08 NOTE — Patient Instructions (Signed)
  Sandra Riley , Thank you for taking time to come for your Medicare Wellness Visit. I appreciate your ongoing commitment to your health goals. Please review the following plan we discussed and let me know if I can assist you in the future.   These are the goals we discussed:  Goals       Depressive Symptoms Identified. Manage Depression issues. Manage Grief issues. Manage anxiety issues (pt-stated)      Time Frame:  Short Term Goal Priority:  High Progress: Not On Track  Start Date:  06/19/21 End Date: 09/22/21  Follow Up Date:  07/20/21 at 1:00 PM  Depressive Symptoms Identified.  Manage Depression issues. Manage Grief issues. Manage anxiety issues  Patient  Coping Skills:  Has support of her spouse, Camelia Eng Has support from her 2 sons. Attends scheduled medical appointments Has no transport needs at present  Patient Self Care Deficits:  Grief issues experienced Depression issues Anxiety issues  Patient Goals:  - spend time or talk with others at least 2 to 3 times per week - practice relaxation or meditation daily - keep a calendar with appointment dates  Follow Up Plan: LCSW to call client on 07/20/21 at 1:00 PM to assess needs of client        Exercise 3x per week (30 min per time)      Recommend increasing your exercise routine at least 3 days a week for 30-45 minutes at a time as tolerated.        Patient Stated      Write poem book         This is a list of the screening recommended for you and due dates:  Health Maintenance  Topic Date Due   DTaP/Tdap/Td vaccine (3 - Td or Tdap) 09/13/2020   COVID-19 Vaccine (7 - 2023-24 season) 02/05/2022   Flu Shot  09/02/2022   Medicare Annual Wellness Visit  07/08/2023   Mammogram  07/30/2023   Pap Smear  10/22/2023   Colon Cancer Screening  02/14/2029   Hepatitis C Screening  Completed   HIV Screening  Completed   Zoster (Shingles) Vaccine  Completed   HPV Vaccine  Aged Out

## 2022-07-21 DIAGNOSIS — R2689 Other abnormalities of gait and mobility: Secondary | ICD-10-CM | POA: Diagnosis not present

## 2022-07-21 DIAGNOSIS — S72042D Displaced fracture of base of neck of left femur, subsequent encounter for closed fracture with routine healing: Secondary | ICD-10-CM | POA: Diagnosis not present

## 2022-07-21 DIAGNOSIS — M6281 Muscle weakness (generalized): Secondary | ICD-10-CM | POA: Diagnosis not present

## 2022-07-21 DIAGNOSIS — Z9181 History of falling: Secondary | ICD-10-CM | POA: Diagnosis not present

## 2022-08-04 ENCOUNTER — Ambulatory Visit (HOSPITAL_COMMUNITY)
Admission: RE | Admit: 2022-08-04 | Discharge: 2022-08-04 | Disposition: A | Discharge status: Home / Self Care | Payer: 59 | Source: Ambulatory Visit | Attending: Family Medicine | Admitting: Family Medicine

## 2022-08-04 DIAGNOSIS — Z1231 Encounter for screening mammogram for malignant neoplasm of breast: Secondary | ICD-10-CM | POA: Diagnosis not present

## 2022-08-10 ENCOUNTER — Other Ambulatory Visit (HOSPITAL_COMMUNITY): Payer: Self-pay | Admitting: Family Medicine

## 2022-08-10 DIAGNOSIS — R928 Other abnormal and inconclusive findings on diagnostic imaging of breast: Secondary | ICD-10-CM

## 2022-08-12 ENCOUNTER — Encounter: Payer: Self-pay | Admitting: Physician Assistant

## 2022-08-12 ENCOUNTER — Ambulatory Visit (INDEPENDENT_AMBULATORY_CARE_PROVIDER_SITE_OTHER): Payer: 59 | Admitting: Physician Assistant

## 2022-08-12 DIAGNOSIS — Z96642 Presence of left artificial hip joint: Secondary | ICD-10-CM

## 2022-08-12 NOTE — Progress Notes (Signed)
Post-Op Visit Note   Patient: Sandra Riley           Date of Birth: 1960/09/17           MRN: [id number] Visit Date: 08/12/2022 PCP: Kerri Perches, MD   Assessment & Plan:  Chief Complaint:  Chief Complaint  Patient presents with   Left Hip - Follow-up    Left total hip arthroplasty 04/27/2021   Visit Diagnoses:  1. S/P total left hip arthroplasty     Plan: Patient is a pleasant 63 year old female who comes in today approximately 6 months status post left total hip replacement from a femoral neck fracture, date of surgery 01/27/2022.  She has been doing well.  She has finished physical therapy.  Examination of her left hip reveals painless hip flexion and logroll.  At this point, she will continue to advance with activity as tolerated.  Follow-up with [place] as needed.  Follow-Up Instructions: Return if symptoms worsen or fail to improve.   Orders:  No orders of the defined types were placed in this encounter.  No orders of the defined types were placed in this encounter.   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[facility])     Family History  Problem Relation Age of Onset   Breast cancer Mother 73   Coronary artery disease Father    Stroke Father 81   Breast cancer Sister 79   Breast cancer Sister    Breast cancer Sister    Diabetes Brother    Heart attack Brother     Past Surgical History:  Procedure Laterality Date   ABDOMINAL HYSTERECTOMY     CESAREAN SECTION     CHOLECYSTECTOMY     COLONOSCOPY WITH PROPOFOL N/A 02/15/2019   Procedure: COLONOSCOPY WITH PROPOFOL;  Surgeon: Corbin Ade, MD;  [facility];  Service: Endoscopy;  Laterality: N/A;  2:45pm   POLYPECTOMY  02/15/2019   Procedure: POLYPECTOMY;  Surgeon: Corbin Ade, MD;  [facility];  Service: Endoscopy;;   TOTAL HIP ARTHROPLASTY Left 01/27/2022   Procedure: TOTAL HIP ARTHROPLASTY ANTERIOR APPROACH;  Surgeon: Tarry Kos, MD;  Location: [facility] OR;  Service: Orthopedics;  Laterality: Left;   Social History   Occupational History   Occupation: disabled  Tobacco Use   Smoking status: Never   Smokeless tobacco: Never  Vaping Use   Vaping status: Never Used  Substance and Sexual Activity   Alcohol use: No   Drug use: No   Sexual activity: Yes    Birth control/protection: Surgical

## 2022-08-13 ENCOUNTER — Telehealth: Payer: Self-pay

## 2022-08-13 NOTE — Telephone Encounter (Signed)
Patient informed.  Verbalized understanding.

## 2022-08-13 NOTE — Telephone Encounter (Signed)
-----   Message from Vishnu P Mallipeddi sent at 08/13/2022  2:42 PM EDT ----- Echo showed hyperdynamic pumping function of the heart and no valvular heart disease.  Mild LVOT gradient in the heart of 24 mmHg.  Continue metoprolol at the current dose.

## 2022-08-17 ENCOUNTER — Other Ambulatory Visit: Payer: Self-pay | Admitting: Internal Medicine

## 2022-08-17 DIAGNOSIS — F39 Unspecified mood [affective] disorder: Secondary | ICD-10-CM

## 2022-08-18 ENCOUNTER — Other Ambulatory Visit (HOSPITAL_COMMUNITY): Payer: Self-pay | Admitting: Family Medicine

## 2022-08-18 ENCOUNTER — Ambulatory Visit (HOSPITAL_COMMUNITY)
Admission: RE | Admit: 2022-08-18 | Discharge: 2022-08-18 | Disposition: A | Discharge status: Home / Self Care | Payer: 59 | Source: Ambulatory Visit | Attending: Family Medicine | Admitting: Family Medicine

## 2022-08-18 DIAGNOSIS — N6321 Unspecified lump in the left breast, upper outer quadrant: Secondary | ICD-10-CM | POA: Diagnosis not present

## 2022-08-18 DIAGNOSIS — R928 Other abnormal and inconclusive findings on diagnostic imaging of breast: Secondary | ICD-10-CM | POA: Insufficient documentation

## 2022-08-18 DIAGNOSIS — Z803 Family history of malignant neoplasm of breast: Secondary | ICD-10-CM | POA: Diagnosis not present

## 2022-08-18 DIAGNOSIS — R92333 Mammographic heterogeneous density, bilateral breasts: Secondary | ICD-10-CM | POA: Diagnosis not present

## 2022-08-18 DIAGNOSIS — R921 Mammographic calcification found on diagnostic imaging of breast: Secondary | ICD-10-CM | POA: Diagnosis not present

## 2022-08-20 DIAGNOSIS — R2689 Other abnormalities of gait and mobility: Secondary | ICD-10-CM | POA: Diagnosis not present

## 2022-08-20 DIAGNOSIS — M6281 Muscle weakness (generalized): Secondary | ICD-10-CM | POA: Diagnosis not present

## 2022-08-20 DIAGNOSIS — S72042D Displaced fracture of base of neck of left femur, subsequent encounter for closed fracture with routine healing: Secondary | ICD-10-CM | POA: Diagnosis not present

## 2022-08-20 DIAGNOSIS — Z9181 History of falling: Secondary | ICD-10-CM | POA: Diagnosis not present

## 2022-08-24 ENCOUNTER — Ambulatory Visit (HOSPITAL_COMMUNITY)
Admission: RE | Admit: 2022-08-24 | Discharge: 2022-08-24 | Disposition: A | Discharge status: Home / Self Care | Payer: 59 | Source: Ambulatory Visit | Attending: Family Medicine | Admitting: Family Medicine

## 2022-08-24 ENCOUNTER — Encounter (HOSPITAL_COMMUNITY): Payer: Self-pay

## 2022-08-24 ENCOUNTER — Other Ambulatory Visit (HOSPITAL_COMMUNITY): Payer: Self-pay | Admitting: Family Medicine

## 2022-08-24 DIAGNOSIS — R928 Other abnormal and inconclusive findings on diagnostic imaging of breast: Secondary | ICD-10-CM | POA: Insufficient documentation

## 2022-08-24 DIAGNOSIS — N6022 Fibroadenosis of left breast: Secondary | ICD-10-CM | POA: Diagnosis not present

## 2022-08-24 DIAGNOSIS — R921 Mammographic calcification found on diagnostic imaging of breast: Secondary | ICD-10-CM | POA: Diagnosis not present

## 2022-08-24 HISTORY — PX: BREAST BIOPSY: SHX20

## 2022-08-24 MED ORDER — LIDOCAINE HCL (PF) 2 % IJ SOLN
INTRAMUSCULAR | Status: AC
  Filled 2022-08-24: qty 10

## 2022-08-24 MED ORDER — LIDOCAINE HCL (PF) 2 % IJ SOLN
10.0000 mL | Freq: Once | INTRAMUSCULAR | Status: AC
  Administered 2022-08-24: 10 mL

## 2022-08-24 MED ORDER — LIDOCAINE-EPINEPHRINE (PF) 1 %-1:200000 IJ SOLN
INTRAMUSCULAR | Status: AC
  Filled 2022-08-24: qty 30

## 2022-08-24 MED ORDER — LIDOCAINE-EPINEPHRINE (PF) 1 %-1:200000 IJ SOLN
10.0000 mL | Freq: Once | INTRAMUSCULAR | Status: AC
  Administered 2022-08-24: 10 mL via INTRADERMAL

## 2022-08-24 NOTE — Progress Notes (Signed)
PT tolerated left breast biopsy well today with NAD noted. PT verbalized understanding of discharge instructions. PT ambulated back to the mammogram area this time with walker and given ice packs to take home.

## 2022-08-25 LAB — SURGICAL PATHOLOGY

## 2022-08-31 ENCOUNTER — Ambulatory Visit: Payer: 59 | Attending: Internal Medicine | Admitting: Internal Medicine

## 2022-08-31 ENCOUNTER — Encounter: Payer: Self-pay | Admitting: Internal Medicine

## 2022-08-31 VITALS — BP 114/70 | HR 59 | Ht 62.0 in | Wt 127.8 lb

## 2022-08-31 DIAGNOSIS — I5189 Other ill-defined heart diseases: Secondary | ICD-10-CM | POA: Diagnosis not present

## 2022-08-31 DIAGNOSIS — R002 Palpitations: Secondary | ICD-10-CM

## 2022-08-31 NOTE — Progress Notes (Signed)
Cardiology Office Note  Date: 08/31/2022   ID: Sandra, Riley 1961/01/21, MRN [id number]  PCP:  Kerri Perches, MD  Cardiologist:  Marjo Bicker, MD Electrophysiologist:  None    History of Present Illness: Sandra Riley is a 62 y.o. female known to have HTN, anxiety is here for follow-up visit.  Patient used to be active prior to the hip fracture in 01/2022, used to make 22 laps around her gym.  But after hip fracture, hip replacement surgery and undergoing physical therapy sessions, she is currently able to do 1 lap and slowly trying to catch her.  Not much active due to the hip fracture recently.  Event monitor from 4/24 showed NSR ranging between 59 and 151 bpm with average HR 80 bpm, patient symptoms correlated with NSR (87 to 139 bpm) and ventricular ectopy. <1% PAC and <1% PVC burden. Echocardiogram from 5/24 showed SAM with a dynamic LVOT gradient in the setting of moderate symmetrical LVH and hyperdynamic LV function with peak resting gradient 24 mmHg, mild.  She was placed on metoprolol tartrate 25 mg twice daily.  She is here for follow-up visit.  No symptoms of angina, palpitations, SOB, syncope, presyncope, leg swelling.  Past Medical History:  Diagnosis Date   Abnormal facial hair 05/11/2011   Anxiety and depression    GAD (generalized anxiety disorder)    Insomnia    Psychotic disorder (HCC)    Seizure disorder (HCC)    stress related seizures. 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[facility])     Past Surgical History:  Procedure Laterality Date   ABDOMINAL HYSTERECTOMY     BREAST BIOPSY Left 08/24/2022   [place] LT BREAST BX W LOC DEV 1ST LESION IMG BX SPEC US GUIDE 08/24/2022 [facility]-ULTRASOUND   CESAREAN SECTION     CHOLECYSTECTOMY     COLONOSCOPY WITH PROPOFOL N/A 02/15/2019   Procedure: COLONOSCOPY WITH PROPOFOL;  Surgeon: Corbin Ade, MD;  Location: [facility] ENDO SU[facility];  Service: Endoscopy;  Laterality: N/A;  2:45pm   POLYPECTOMY   02/15/2019   Procedure: POLYPECTOMY;  Surgeon: Corbin Ade, MD;  Location: [facility];  Service: Endoscopy;;   TOTAL HIP ARTHROPLASTY Left 01/27/2022   Procedure: TOTAL HIP ARTHROPLASTY ANTERIOR APPROACH;  Surgeon: Tarry Kos, MD;  Location: [facility] OR;  Service: Orthopedics;  Laterality: Left;    Current Outpatient Medications  Medication Sig Dispense Refill   acetaminophen (TYLENOL) 500 MG tablet Take 1,000 mg by mouth every 6 (six) hours as needed for moderate pain.     alendronate (FOSAMAX) 35 MG tablet TAKE ONE TABLET BY MOUTH ONCE A WEEK IN THE MORNING WITH A FULL GLASS OF WATER ON AN EMPTY STOMACH. DO NOT LAY DOWN FOR 30 MINUTES. 5 tablet 5   amLODipine (NORVASC) 2.5 MG tablet Take 1 tablet (2.5 mg total) by mouth daily. 90 tablet 1   Ascorbic Acid (VITAMIN C PO) Take 1 tablet by mouth daily.     BIOTIN 5000 PO Take 5,000 mcg by mouth once. daily     Calcium Carb-Cholecalciferol 1000-800 MG-UNIT TABS Take 1 tablet by mouth 3 (three) times daily.      Cholecalciferol (VITAMIN D) 50 MCG (2000 UT) CAPS Take 1 capsule by mouth daily.     clonazePAM (KLONOPIN) 1 MG tablet Take 1 tablet (1 mg total) by mouth 2 (two) times daily. 60 tablet 5   ferrous sulfate 325 (65 FE) MG tablet Take 325 mg  by mouth once. daily     Humidifier MISC Large humidifier. 1 each 0   levETIRAcetam (KEPPRA) 750 MG tablet Take 2 tablets (1,500 mg total) by mouth 2 (two) times daily. 360 tablet 3   MAGNESIUM PO Take 500 mg by mouth daily.      metoprolol tartrate (LOPRESSOR) 25 MG tablet Take 1 tablet (25 mg total) by mouth 2 (two) times daily. 180 tablet 1   mirtazapine (REMERON) 45 MG tablet Take 1 tablet (45 mg total) by mouth at bedtime. 30 tablet 5   Multiple Vitamin (MULTIVITAMIN) tablet Take 1 tablet by mouth daily.     PHENObarbital (LUMINAL) 100 MG tablet Take 2 tabs Monday-Friday and 1 tab Saturday/Sunday 180 tablet 1   Potassium 99 MG TABS Take 99 mg by mouth daily.      spironolactone (ALDACTONE) 50  MG tablet Take 1 tablet (50 mg total) by mouth daily. 90 tablet 3   zinc gluconate 50 MG tablet Take 50 mg by mouth daily.     No current facility-administered medications for this visit.   Allergies:  Aspirin, Nsaids, and Singulair [montelukast sodium]   Social History: The patient  reports that she has never smoked. She has never used smokeless tobacco. She reports that she does not drink alcohol and does not use drugs.   Family History: The patient's family history includes Breast cancer in her sister and sister; Breast cancer (age of onset: 70) in her mother; Breast cancer (age of onset: 27) in her sister; Coronary artery disease in her father; Diabetes in her brother; Heart attack in her brother; Stroke (age of onset: 28) in her father.   ROS:  Please see the history of present illness. Otherwise, complete review of systems is positive for none  All other systems are reviewed and negative.   Physical Exam: VS:  BP 114/70   Pulse (!) 59   Ht 5\' 2"  (1.575 m)   Wt 127 lb 12.8 oz (58 kg)   SpO2 99%   BMI 23.37 kg/m , BMI Body mass index is 23.37 kg/m.  Wt Readings from Last 3 Encounters:  08/31/22 127 lb 12.8 oz (58 kg)  05/20/22 124 lb 9.6 oz (56.5 kg)  05/11/22 123 lb (55.8 kg)    General: Patient appears comfortable at rest. HEENT: Conjunctiva and lids normal, oropharynx clear with moist mucosa. Neck: Supple, no elevated JVP or carotid bruits, no thyromegaly. Lungs: Clear to auscultation, nonlabored breathing at rest. Cardiac: Regular rate and rhythm, holosystolic murmur, Grade 2-3/6 Abdomen: Soft, nontender, no hepatomegaly, bowel sounds present, no guarding or rebound. Extremities: No pitting edema, distal pulses 2+. Skin: Warm and dry. Musculoskeletal: No kyphosis. Neuropsychiatric: Alert and oriented x3, affect grossly appropriate.  Recent Labwork: 02/02/2022: Hemoglobin 9.5; Platelets 396 05/11/2022: ALT 17; AST 24; BUN 3; Creatinine, Ser 0.62; Magnesium 1.9; Potassium  4.1; Sodium 136; TSH 1.730     Component Value Date/Time   CHOL 174 12/09/2021 1142   TRIG 72 12/09/2021 1142   HDL 64 12/09/2021 1142   CHOLHDL 2.7 12/09/2021 1142   CHOLHDL 2.8 05/15/2019 0949   VLDL 17 01/07/2016 1153   LDLCALC 96 12/09/2021 1142   LDLCALC 86 05/15/2019 0949    Other Studies Reviewed Today:   Assessment and Plan:  Patient is a 62 year old F known to have HTN, anxiety was referred to cardiology clinic for evaluation of persistent tachycardia.  # Palpitations # Dynamic LVOT gradient in the setting of moderate LVH -Event monitor from 4/24 showed NSR  ranging between 59 and 151 bpm with average HR 80 bpm, patient symptoms correlated with NSR (87 to 139 bpm) and ventricular ectopy. <1% PAC and <1% PVC burden. Echocardiogram from 5/24 showed SAM with a dynamic LVOT gradient in the setting of moderate symmetrical LVH and hyperdynamic LV function with peak resting gradient 24 mmHg, mild.  Currently on metoprolol tartrate 25 mg twice daily with resolution of palpitations and improvement in symptoms.  # HTN, controlled: Decreased amlodipine from 5 mg to 2.5 mg after metoprolol was added and continue spironolactone 50 mg once daily.  Follows with PCP.   I have spent a total of 30 minutes with patient reviewing chart, EKGs, labs and examining patient as well as establishing an assessment and plan that was discussed with the patient.  > 50% of time was spent in direct patient care.    Medication Adjustments/Labs and Tests Ordered: Current medicines are reviewed at length with the patient today.  Concerns regarding medicines are outlined above.   Tests Ordered: No orders of the defined types were placed in this encounter.   Medication Changes: No orders of the defined types were placed in this encounter.   Disposition:  Follow up  1 year  Signed Jospeh Mangel Verne Spurr, MD, 08/31/2022 2:45 PM    [facility] [address] [place],  [place], [place] [address]

## 2022-08-31 NOTE — Patient Instructions (Signed)
Medication Instructions:  Your physician recommends that you continue on your current medications as directed. Please refer to the Current Medication list given to you today.  *If you need a refill on your cardiac medications before your next appointment, please call your pharmacy*   Lab Work: None If you have labs (blood work) drawn today and your tests are completely normal, you will receive your results only by: MyChart Message (if you have MyChart) OR A paper copy in the mail If you have any lab test that is abnormal or we need to change your treatment, we will call you to review the results.   Testing/Procedures: None   Follow-Up: At [facility], you and your health needs are our priority.  As part of our continuing mission to provide you with exceptional heart care, we have created designated Provider Care Teams.  These Care Teams include your primary Cardiologist (physician) and Advanced Practice Providers (APPs -  Physician Assistants and Nurse Practitioners) who all work together to provide you with the care you need, when you need it.  We recommend signing up for the patient portal called "MyChart".  Sign up information is provided on this After Visit Summary.  MyChart is used to connect with patients for Virtual Visits (Telemedicine).  Patients are able to view lab/test results, encounter notes, upcoming appointments, etc.  Non-urgent messages can be sent to your provider as well.   To learn more about what you can do with MyChart, go to [email or web address].    Your next appointment:   1 year(s)  Provider:   You may see Vishnu Norton Pastel, MD or the following Advanced Practice Provider on your designated Care Team:   Sharlene Dory, NP  Other Instructions

## 2022-09-07 DIAGNOSIS — U071 COVID-19: Secondary | ICD-10-CM | POA: Diagnosis not present

## 2022-09-07 DIAGNOSIS — Z20822 Contact with and (suspected) exposure to covid-19: Secondary | ICD-10-CM | POA: Diagnosis not present

## 2022-09-20 DIAGNOSIS — Z9181 History of falling: Secondary | ICD-10-CM | POA: Diagnosis not present

## 2022-09-20 DIAGNOSIS — R2689 Other abnormalities of gait and mobility: Secondary | ICD-10-CM | POA: Diagnosis not present

## 2022-09-20 DIAGNOSIS — S72042D Displaced fracture of base of neck of left femur, subsequent encounter for closed fracture with routine healing: Secondary | ICD-10-CM | POA: Diagnosis not present

## 2022-09-20 DIAGNOSIS — M6281 Muscle weakness (generalized): Secondary | ICD-10-CM | POA: Diagnosis not present

## 2022-10-05 ENCOUNTER — Ambulatory Visit (INDEPENDENT_AMBULATORY_CARE_PROVIDER_SITE_OTHER): Payer: 59 | Admitting: Family Medicine

## 2022-10-05 ENCOUNTER — Encounter: Payer: Self-pay | Admitting: Family Medicine

## 2022-10-05 VITALS — BP 134/80 | HR 64 | Ht 62.0 in | Wt 117.1 lb

## 2022-10-05 DIAGNOSIS — R748 Abnormal levels of other serum enzymes: Secondary | ICD-10-CM | POA: Diagnosis not present

## 2022-10-05 DIAGNOSIS — F322 Major depressive disorder, single episode, severe without psychotic features: Secondary | ICD-10-CM

## 2022-10-05 DIAGNOSIS — G40309 Generalized idiopathic epilepsy and epileptic syndromes, not intractable, without status epilepticus: Secondary | ICD-10-CM | POA: Diagnosis not present

## 2022-10-05 DIAGNOSIS — I10 Essential (primary) hypertension: Secondary | ICD-10-CM | POA: Diagnosis not present

## 2022-10-05 DIAGNOSIS — F39 Unspecified mood [affective] disorder: Secondary | ICD-10-CM | POA: Diagnosis not present

## 2022-10-05 NOTE — Progress Notes (Signed)
   Sandra Riley     MRN: [id number]      DOB: 1960-10-11  Chief Complaint  Patient presents with   Follow-up    Follow up had covid beginning of August , nerves, covid vaccine/flu ok to take together    HPI Sandra Riley is here for follow up and re-evaluation of chronic medical conditions, medication management and review of any available recent lab and radiology data.  Preventive health is updated, specifically  Cancer screening and Immunization.   Pt seen in urgent Care in [place] in August and dx with covid, had fatigue and poor appetite , also  cough , no fever , or SOB, recovering slowly, wants to know when she can get the covid vaccine , I advised wait 3 months.will get flu vaccine at pharmacy today ( [facility]) Feeling shocking pains/ discomfort, in arms  and legs for over 1 year on avg twice per month, no seizure activity noted    ROS Denies recent fever or chills. Denies sinus pressure, nasal congestion, ear pain or sore throat. Denies chest congestion, productive cough or wheezing. Denies chest pains, palpitations and leg swelling Denies abdominal pain, nausea, vomiting,diarrhea or constipation.   Denies dysuria, frequency, hesitancy or incontinence. . Denies uncontrolled  depression, anxiety or insomnia. Denies skin break down or rash.   PE  BP 134/80 (BP Location: Right Arm, Patient Position: Sitting, Cuff Size: Normal)   Pulse 64   Ht 5\' 2"  (1.575 m)   Wt 117 lb 1.9 oz (53.1 kg)   SpO2 96%   BMI 21.42 kg/m   Patient alert and oriented and in no cardiopulmonary distress.  HEENT: No facial asymmetry, EOMI,     Neck supple .  Chest: Clear to auscultation bilaterally.  CVS: S1, S2 no murmurs, no S3.Regular rate.  ABD: Soft non tender.   Ext: No edema  MS: Adequate ROM spine, shoulders, hips and knees.  Skin: Intact, no ulcerations or rash noted.  Psych: Good eye contact, normal affect. Memory intact not anxious or depressed appearing.  CNS: CN 2-12  intact, power,  normal throughout.no focal deficits noted.   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

## 2022-10-05 NOTE — Patient Instructions (Addendum)
Annual exam 11/08 or after, call if you need me sooner  Nurse pls document tdaP states she got at [facility] this year  Please get flu vaccine at your pharmacy today as discussed or asap  Since you  had covid in August ,I recommend  get the vaccine in November  Labs today CBC, cmp and eGFR  Happy anniversary  and many more

## 2022-10-06 DIAGNOSIS — R748 Abnormal levels of other serum enzymes: Secondary | ICD-10-CM | POA: Insufficient documentation

## 2022-10-06 LAB — CBC
Hematocrit: 35.6 % (ref 34.0–46.6)
Hemoglobin: 12.1 g/dL (ref 11.1–15.9)
MCH: 33.3 pg — ABNORMAL HIGH (ref 26.6–33.0)
MCHC: 34 g/dL (ref 31.5–35.7)
MCV: 98 fL — ABNORMAL HIGH (ref 79–97)
Platelets: 418 10*3/uL (ref 150–450)
RBC: 3.63 x10E6/uL — ABNORMAL LOW (ref 3.77–5.28)
RDW: 12.7 % (ref 11.7–15.4)
WBC: 6.7 10*3/uL (ref 3.4–10.8)

## 2022-10-06 LAB — CMP14+EGFR
ALT: 12 IU/L (ref 0–32)
AST: 25 IU/L (ref 0–40)
Albumin: 3.6 g/dL — ABNORMAL LOW (ref 3.9–4.9)
Alkaline Phosphatase: 200 IU/L — ABNORMAL HIGH (ref 44–121)
BUN/Creatinine Ratio: 9 — ABNORMAL LOW (ref 12–28)
BUN: 4 mg/dL — ABNORMAL LOW (ref 8–27)
Bilirubin Total: <0.2 mg/dL (ref 0.0–1.2)
CO2: 27 mmol/L (ref 20–29)
Calcium: 9.1 mg/dL (ref 8.7–10.3)
Chloride: 101 mmol/L (ref 96–106)
Creatinine, Ser: 0.47 mg/dL — ABNORMAL LOW (ref 0.57–1.00)
Globulin, Total: 2.7 g/dL (ref 1.5–4.5)
Glucose: 88 mg/dL (ref 70–99)
Potassium: 3.6 mmol/L (ref 3.5–5.2)
Sodium: 142 mmol/L (ref 134–144)
Total Protein: 6.3 g/dL (ref 6.0–8.5)
eGFR: 106 mL/min/{1.73_m2} (ref >59)

## 2022-10-06 NOTE — Assessment & Plan Note (Signed)
Needs [place] of liver and is also referred to gI

## 2022-10-10 NOTE — Assessment & Plan Note (Signed)
Controlled, no change in medication  

## 2022-10-10 NOTE — Assessment & Plan Note (Signed)
Controlled on current medication, no change

## 2022-10-10 NOTE — Assessment & Plan Note (Signed)
Managed by Neurology, no breakthrough seizures since last visit reported

## 2022-10-13 ENCOUNTER — Ambulatory Visit (HOSPITAL_COMMUNITY)
Admission: RE | Admit: 2022-10-13 | Discharge: 2022-10-13 | Disposition: A | Discharge status: Home / Self Care | Payer: 59 | Source: Ambulatory Visit | Attending: Family Medicine | Admitting: Family Medicine

## 2022-10-13 DIAGNOSIS — R748 Abnormal levels of other serum enzymes: Secondary | ICD-10-CM | POA: Diagnosis not present

## 2022-10-13 DIAGNOSIS — K760 Fatty (change of) liver, not elsewhere classified: Secondary | ICD-10-CM | POA: Diagnosis not present

## 2022-10-18 NOTE — Progress Notes (Unsigned)
GI Office Note    Referring Provider: Kerri Perches, MD Primary Care Physician:  Kerri Perches, MD  Primary Gastroenterologist:  Chief Complaint   No chief complaint on file.    History of Present Illness   Sandra Riley is a 62 y.o. female presenting today     Component     Latest Ref Rng 07/17/2021 12/09/2021 01/26/2022 01/27/2022 01/28/2022  AST     0 - 40 IU/L 40  24      ALT     0 - 32 IU/L 30  18      eGFR     >59 mL/min/1.73 99  100      Albumin     3.9 - 4.9 g/dL 4.4  4.7      Globulin, Total     1.5 - 4.5 g/dL 3.3  2.9      Alkaline Phosphatase     44 - 121 IU/L 205 (H)  173 (H)       Component     Latest Ref Rng 01/29/2022 01/30/2022 01/31/2022 02/01/2022 02/02/2022  AST     0 - 40 IU/L       ALT     0 - 32 IU/L       eGFR     >59 mL/min/1.73       Albumin     3.9 - 4.9 g/dL       Globulin, Total     1.5 - 4.5 g/dL       Alkaline Phosphatase     44 - 121 IU/L        Component     Latest Ref Rng 02/17/2022 02/25/2022 05/11/2022 10/05/2022  AST     0 - 40 IU/L 31  20  24  25    ALT     0 - 32 IU/L 30  15  17  12    eGFR     >59 mL/min/1.73 107  105  100  106   Albumin     3.9 - 4.9 g/dL 4.2  4.3  4.2  3.6 (L)   Globulin, Total     1.5 - 4.5 g/dL 3.7  3.0  3.4  2.7   Alkaline Phosphatase     44 - 121 IU/L 292 (H)  237 (H)  248 (H)  200 (H)     RUQ U/S: with hypoechoic materian in CBD. CBD 9.22mm. Fatty liver. Consider MRCP.  Colonoscopy 02/2019: -diverticulosis -one 5mm polyp at hepatic flexure removed -next colonoscopy five years.   Medications   Current Outpatient Medications  Medication Sig Dispense Refill   acetaminophen (TYLENOL) 500 MG tablet Take 1,000 mg by mouth every 6 (six) hours as needed for moderate pain.     alendronate (FOSAMAX) 35 MG tablet TAKE ONE TABLET BY MOUTH ONCE A WEEK IN THE MORNING WITH A FULL GLASS OF WATER ON AN EMPTY STOMACH. DO NOT LAY DOWN FOR 30 MINUTES. 5 tablet 5   amLODipine (NORVASC) 2.5 MG  tablet Take 1 tablet (2.5 mg total) by mouth daily. 90 tablet 1   Ascorbic Acid (VITAMIN C PO) Take 1 tablet by mouth daily.     B Complex-C (VITAMIN B + C COMPLEX PO) Take by mouth.     BIOTIN 5000 PO Take 5,000 mcg by mouth once. daily     Calcium Carb-Cholecalciferol 1000-800 MG-UNIT TABS Take 1 tablet by mouth 3 (three) times daily.      Cholecalciferol (VITAMIN  D) 50 MCG (2000 UT) CAPS Take 1 capsule by mouth daily.     clonazePAM (KLONOPIN) 1 MG tablet Take 1 tablet (1 mg total) by mouth 2 (two) times daily. 60 tablet 5   ferrous sulfate 325 (65 FE) MG tablet Take 325 mg by mouth once. daily     Humidifier MISC Large humidifier. 1 each 0   levETIRAcetam (KEPPRA) 750 MG tablet Take 2 tablets (1,500 mg total) by mouth 2 (two) times daily. 360 tablet 3   MAGNESIUM PO Take 500 mg by mouth daily.      metoprolol tartrate (LOPRESSOR) 25 MG tablet Take 1 tablet (25 mg total) by mouth 2 (two) times daily. 180 tablet 1   mirtazapine (REMERON) 45 MG tablet Take 1 tablet (45 mg total) by mouth at bedtime. 30 tablet 5   Multiple Vitamin (MULTIVITAMIN) tablet Take 1 tablet by mouth daily.     PHENObarbital (LUMINAL) 100 MG tablet Take 2 tabs Monday-Friday and 1 tab Saturday/Sunday 180 tablet 1   Potassium 99 MG TABS Take 99 mg by mouth daily.      spironolactone (ALDACTONE) 50 MG tablet Take 1 tablet (50 mg total) by mouth daily. (Patient not taking: Reported on 10/05/2022) 90 tablet 3   zinc gluconate 50 MG tablet Take 50 mg by mouth daily.     No current facility-administered medications for this visit.    Allergies   Allergies as of 10/19/2022 - Review Complete 10/05/2022  Allergen Reaction Noted   Aspirin  02/21/2007   Nsaids Other (See Comments) 01/26/2022   Singulair [montelukast sodium] Other (See Comments) 03/30/2016    Past Medical History   Past Medical History:  Diagnosis Date   Abnormal facial hair 05/11/2011   Anxiety and depression    GAD (generalized anxiety disorder)     Insomnia    Psychotic disorder (HCC)    Seizure disorder (HCC)    stress related seizures. 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[facility])     Past Surgical History   Past Surgical History:  Procedure Laterality Date   ABDOMINAL HYSTERECTOMY     BREAST BIOPSY Left 08/24/2022   [place] LT BREAST BX W LOC DEV 1ST LESION IMG BX SPEC US GUIDE 08/24/2022 [facility]-ULTRASOUND   CESAREAN SECTION     CHOLECYSTECTOMY     COLONOSCOPY WITH PROPOFOL N/A 02/15/2019   Procedure: COLONOSCOPY WITH PROPOFOL;  Surgeon: Corbin Ade, MD;  Location: [facility] ENDO SUITE;  Service: Endoscopy;  Laterality: N/A;  2:45pm   POLYPECTOMY  02/15/2019   Procedure: POLYPECTOMY;  Surgeon: Corbin Ade, MD;  Location: [facility];  Service: Endoscopy;;   TOTAL HIP ARTHROPLASTY Left 01/27/2022   Procedure: TOTAL HIP ARTHROPLASTY ANTERIOR APPROACH;  Surgeon: Tarry Kos, MD;  Location: [facility] OR;  Service: Orthopedics;  Laterality: Left;    Past Family History   Family History  Problem Relation Age of Onset   Breast cancer Mother 40   Coronary artery disease Father    Stroke Father 79   Breast cancer Sister 39   Breast cancer Sister    Breast cancer Sister    Diabetes Brother    Heart attack Brother     Past Social History   Social History   Socioeconomic History   Marital status: Married    Spouse name: Aurther Loft   Number of children: 3   Years of education: 12th   Highest education level: 12th grade  Occupational History   Occupation: disabled  Tobacco Use   Smoking status: Never   Smokeless tobacco: Never  Vaping Use   Vaping status: Never Used  Substance and Sexual Activity   Alcohol use: No   Drug use: No   Sexual activity: Yes    Birth control/protection: Surgical  Other Topics Concern   Not on file  Social History Narrative   Not on file   Social Determinants of Health   Financial Resource Strain: Low Risk  (07/08/2022)   Overall Financial Resource Strain (CARDIA)    Difficulty of  Paying Living Expenses: Not hard at all  Food Insecurity: No Food Insecurity (07/08/2022)   Hunger Vital Sign    Worried About Running Out of Food in the Last Year: Never true    Ran Out of Food in the Last Year: Never true  Transportation Needs: No Transportation Needs (07/08/2022)   PRAPARE - Administrator, Civil Service (Medical): No    Lack of Transportation (Non-Medical): No  Physical Activity: Inactive (07/08/2022)   Exercise Vital Sign    Days of Exercise per Week: 0 days    Minutes of Exercise per Session: 0 min  Stress: No Stress Concern Present (07/08/2022)   [employer] of Occupational Health - Occupational Stress Questionnaire    Feeling of Stress : Not at all  Social Connections: Moderately Isolated (07/08/2022)   Social Connection and Isolation Panel [NHANES]    Frequency of Communication with Friends and Family: Three times a week    Frequency of Social Gatherings with Friends and Family: Twice a week    Attends Religious Services: Never    Database administrator or Organizations: No    Attends Banker Meetings: Never    Marital Status: Married  Catering manager Violence: Not At Risk (07/08/2022)   Humiliation, Afraid, Rape, and Kick questionnaire    Fear of Current or Ex-Partner: No    Emotionally Abused: No    Physically Abused: No    Sexually Abused: No    Review of Systems   General: Negative for anorexia, weight loss, fever, chills, fatigue, weakness. Eyes: Negative for vision changes.  ENT: Negative for hoarseness, difficulty swallowing , nasal congestion. CV: Negative for chest pain, angina, palpitations, dyspnea on exertion, peripheral edema.  Respiratory: Negative for dyspnea at rest, dyspnea on exertion, cough, sputum, wheezing.  GI: See history of present illness. GU:  Negative for dysuria, hematuria, urinary incontinence, urinary frequency, nocturnal urination.  MS: Negative for joint pain, low back pain.  Derm: Negative for rash  or itching.  Neuro: Negative for weakness, abnormal sensation, seizure, frequent headaches, memory loss,  confusion.  Psych: Negative for anxiety, depression, suicidal ideation, hallucinations.  Endo: Negative for unusual weight change.  Heme: Negative for bruising or bleeding. Allergy: Negative for rash or hives.  Physical Exam   There were no vitals taken for this visit.   General: Well-nourished, well-developed in no acute distress.  Head: Normocephalic, atraumatic.   Eyes: Conjunctiva pink, no icterus. Mouth: Oropharyngeal mucosa moist and pink , no lesions erythema or exudate. Neck: Supple without thyromegaly, masses, or lymphadenopathy.  Lungs: Clear to auscultation bilaterally.  Heart: Regular rate and rhythm, no murmurs rubs or gallops.  Abdomen: Bowel sounds are normal, nontender, nondistended, no hepatosplenomegaly or masses,  no abdominal bruits or hernia, no rebound or guarding.   Rectal: *** Extremities: No lower extremity edema. No clubbing or deformities.  Neuro: Alert and oriented x 4 , grossly normal neurologically.  Skin: Warm and dry,  no rash or jaundice.   Psych: Alert and cooperative, normal mood and affect.  Labs   Lab Results  Component Value Date   NA 142 10/05/2022   CL 101 10/05/2022   K 3.6 10/05/2022   CO2 27 10/05/2022   BUN 4 (L) 10/05/2022   CREATININE 0.47 (L) 10/05/2022   EGFR 106 10/05/2022   CALCIUM 9.1 10/05/2022   ALBUMIN 3.6 (L) 10/05/2022   GLUCOSE 88 10/05/2022   Lab Results  Component Value Date   ALT 12 10/05/2022   AST 25 10/05/2022   ALKPHOS 200 (H) 10/05/2022   BILITOT <0.2 10/05/2022   Lab Results  Component Value Date   WBC 6.7 10/05/2022   HGB 12.1 10/05/2022   HCT 35.6 10/05/2022   MCV 98 (H) 10/05/2022   PLT 418 10/05/2022   Lab Results  Component Value Date   TSH 1.730 05/11/2022   Lab Results  Component Value Date   VITAMINB12 495 12/09/2021   No results found for: "FOLATE".  Lab Results  Component  Value Date   IRON 62 12/09/2021   HCV Ab negative 02/2021.   Imaging Studies   US Abdomen Limited RUQ (LIVER/GB)  Result Date: 10/13/2022 CLINICAL DATA:  Elevated alkaline phosphatase. EXAM: ULTRASOUND ABDOMEN LIMITED RIGHT UPPER QUADRANT COMPARISON:  None Available. FINDINGS: Gallbladder: Prior cholecystectomy. Common bile duct: Diameter: 9.8 mm, favor postsurgical change. Hypoechoic material noted in the common bile duct. Liver: No focal lesion identified. Increased echotexture. Portal vein is patent on color Doppler imaging with normal direction of blood flow towards the liver. Other: None. IMPRESSION: 1. Prior cholecystectomy. 2. Hypoechoic material noted in the common bile duct. This is nonspecific. Although this may represent debris, intra ductal mass is not excluded. Consider further visualization with MRCP. 3. Fatty infiltration of the liver. Electronically Signed   By: Sherian Rein M.D.   On: 10/13/2022 11:00    Assessment       PLAN   ***   Leanna Battles. Melvyn Neth, MHS, PA-C [facility]

## 2022-10-19 ENCOUNTER — Encounter: Payer: Self-pay | Admitting: Gastroenterology

## 2022-10-19 ENCOUNTER — Ambulatory Visit (INDEPENDENT_AMBULATORY_CARE_PROVIDER_SITE_OTHER): Payer: 59 | Admitting: Gastroenterology

## 2022-10-19 VITALS — BP 145/85 | HR 56 | Temp 98.1°F | Ht 62.0 in | Wt 119.0 lb

## 2022-10-19 DIAGNOSIS — R748 Abnormal levels of other serum enzymes: Secondary | ICD-10-CM

## 2022-10-19 DIAGNOSIS — K839 Disease of biliary tract, unspecified: Secondary | ICD-10-CM | POA: Insufficient documentation

## 2022-10-19 NOTE — Patient Instructions (Signed)
Please complete labs at [facility].  You may be able to do this at Dr. Anthony Sar office or at the [facility] at [address], [place]. Complete MRI as scheduled. Call if you have any fever, abdominal pain, nausea/vomiting.

## 2022-10-20 ENCOUNTER — Encounter: Payer: Self-pay | Admitting: Gastroenterology

## 2022-10-21 DIAGNOSIS — S72042D Displaced fracture of base of neck of left femur, subsequent encounter for closed fracture with routine healing: Secondary | ICD-10-CM | POA: Diagnosis not present

## 2022-10-21 DIAGNOSIS — Z9181 History of falling: Secondary | ICD-10-CM | POA: Diagnosis not present

## 2022-10-21 DIAGNOSIS — R2689 Other abnormalities of gait and mobility: Secondary | ICD-10-CM | POA: Diagnosis not present

## 2022-10-21 DIAGNOSIS — M6281 Muscle weakness (generalized): Secondary | ICD-10-CM | POA: Diagnosis not present

## 2022-10-21 LAB — MITOCHONDRIAL ANTIBODIES: Mitochondrial Ab: <20 Units (ref 0.0–20.0)

## 2022-10-21 LAB — HEPATIC FUNCTION PANEL
ALT: 16 IU/L (ref 0–32)
AST: 35 IU/L (ref 0–40)
Albumin: 3.9 g/dL (ref 3.9–4.9)
Alkaline Phosphatase: 210 IU/L — ABNORMAL HIGH (ref 44–121)
Bilirubin Total: <0.2 mg/dL (ref 0.0–1.2)
Bilirubin, Direct: <0.1 mg/dL (ref 0.00–0.40)
Total Protein: 7 g/dL (ref 6.0–8.5)

## 2022-10-21 LAB — ALKALINE PHOSPHATASE, ISOENZYMES
BONE FRACTION: 23 % (ref 14–68)
INTESTINAL FRAC.: 0 % (ref 0–18)
LIVER FRACTION: 77 % (ref 18–85)

## 2022-10-21 LAB — GAMMA GT: GGT: 379 IU/L — ABNORMAL HIGH (ref 0–60)

## 2022-10-28 ENCOUNTER — Ambulatory Visit (HOSPITAL_COMMUNITY)
Admission: RE | Admit: 2022-10-28 | Discharge: 2022-10-28 | Disposition: A | Discharge status: Home / Self Care | Payer: 59 | Source: Ambulatory Visit | Attending: Gastroenterology | Admitting: Gastroenterology

## 2022-10-28 ENCOUNTER — Other Ambulatory Visit (HOSPITAL_COMMUNITY): Payer: Self-pay | Admitting: Gastroenterology

## 2022-10-28 DIAGNOSIS — R748 Abnormal levels of other serum enzymes: Secondary | ICD-10-CM | POA: Diagnosis not present

## 2022-10-28 DIAGNOSIS — R52 Pain, unspecified: Secondary | ICD-10-CM | POA: Insufficient documentation

## 2022-10-28 DIAGNOSIS — Z9049 Acquired absence of other specified parts of digestive tract: Secondary | ICD-10-CM | POA: Diagnosis not present

## 2022-10-28 DIAGNOSIS — R109 Unspecified abdominal pain: Secondary | ICD-10-CM | POA: Diagnosis not present

## 2022-10-28 DIAGNOSIS — I700 Atherosclerosis of aorta: Secondary | ICD-10-CM | POA: Diagnosis not present

## 2022-10-28 DIAGNOSIS — K839 Disease of biliary tract, unspecified: Secondary | ICD-10-CM | POA: Insufficient documentation

## 2022-10-28 DIAGNOSIS — K838 Other specified diseases of biliary tract: Secondary | ICD-10-CM | POA: Diagnosis not present

## 2022-10-28 MED ORDER — GADOPICLENOL 0.5 MMOL/ML IV SOLN
5.5000 mL | Freq: Once | INTRAVENOUS | Status: AC | PRN
  Administered 2022-10-28: 5.5 mL via INTRAVENOUS

## 2022-11-18 ENCOUNTER — Ambulatory Visit (INDEPENDENT_AMBULATORY_CARE_PROVIDER_SITE_OTHER): Payer: 59 | Admitting: Neurology

## 2022-11-18 ENCOUNTER — Telehealth: Payer: Self-pay | Admitting: Neurology

## 2022-11-18 ENCOUNTER — Encounter: Payer: Self-pay | Admitting: Neurology

## 2022-11-18 VITALS — BP 112/68 | Ht 62.0 in | Wt 122.0 lb

## 2022-11-18 DIAGNOSIS — Z5181 Encounter for therapeutic drug level monitoring: Secondary | ICD-10-CM | POA: Diagnosis not present

## 2022-11-18 DIAGNOSIS — G40219 Localization-related (focal) (partial) symptomatic epilepsy and epileptic syndromes with complex partial seizures, intractable, without status epilepticus: Secondary | ICD-10-CM | POA: Diagnosis not present

## 2022-11-18 DIAGNOSIS — G40909 Epilepsy, unspecified, not intractable, without status epilepticus: Secondary | ICD-10-CM

## 2022-11-18 MED ORDER — PHENOBARBITAL 100 MG PO TABS: 200.0000 mg | ORAL_TABLET | Freq: Every day | ORAL | 3 refills | Status: DC

## 2022-11-18 MED ORDER — LEVETIRACETAM 1000 MG PO TABS: 2000.0000 mg | ORAL_TABLET | Freq: Two times a day (BID) | ORAL | 3 refills | Status: DC

## 2022-11-18 NOTE — Patient Instructions (Addendum)
Increase Keppra to 2000 mg twice daily  Increase Phenobarbital to 200 mg nightly  Will check labs today  MRI Brain with and without contrast  Follow up in 6 months or sooner if worse

## 2022-11-18 NOTE — Progress Notes (Signed)
[facility]  PATIENT: Sandra Riley DOB: 1960-06-23  REQUESTING CLINICIAN: Kerri Perches, MD HISTORY FROM: Patient, husband and chart review  REASON FOR VISIT: Seizure disorder, here to establish care    HISTORICAL  CHIEF COMPLAINT:  Chief Complaint  Patient presents with   Follow-up    Rm 13, sz f/u 2 sz in sleep since last visit, no missed medication doses, denies SI/HI   INTERVAL HISTORY 11/18/2022 Patient presents today for follow-up, last visit was in January, since then, she tells me that she had her hip surgery but continues to have breakthrough seizures.  She reports at least 2 seizures during her sleep.  Husband is not aware of the seizures but patient tells me that when she wakes up in the morning, she feels tired, feels drowsy, weak and has a bad taste in her mouth and that is how she knows she had a seizure.   Husband tells me about once a month she will have episode of behavioral arrest, unresponsiveness, looking like she is in a trance.  She is on phenobarbital 200 mg nightly Monday to Friday and 100 mg nightly Saturday and Sunday and Keppra 1500 mg twice daily but still having breakthrough seizure. Her routine EEG showed left temporal epileptiform discharges.     HISTORY OF PRESENT ILLNESS:  This is a 62 year old woman past medical history of seizure disorder, anxiety, depression, osteoporosis, recent fall with hip fracture who is presenting to establish care.  Patient reports a long history of seizure diagnosed at the age of 61.  Initially she describes her seizures as generalized convulsion with tongue biting.  He was on phenobarbital and Dilantin.  She reported her seizure was uncontrolled until she initially follow-up with Dr. Gerilyn Pilgrim who added levetiracetam.  Since being on levetiracetam and phenobarbital she has not had any generalized convulsion and this is more than 20 years ago.  Now she is reporting seizure that she described as  disorientation and losing track of time and confusion.  She still having those and they have been worse in the past year since her son died\.  Currently she is on phenobarbital 97.2 mg 2 tabs nightly and Keppra 1500 mg twice daily.  While in rehab, she was told that her phenobarbital level was elevated therefore it was decreased to 1 tablet on Saturday and Sunday.  She denies any symptoms  associated with phenobarb toxicity and she reports that her last seizure that she described as disorientation and losing track of time was in December 2023 since then she has not had any additional seizures.  She is tolerating the medications very well and denies any side effect. She denies any seizure risk factors, denies any family history of seizures denies any injury from seizures. Her fall and hip fracture was not related to a seizure.   Handedness: Right handed   Onset: At the age of 42  Seizure Type: Disorientation, losing track of time, last convulsion more than 20 year ago   Current frequency: Last seiure was in Dec 2023  Any injuries from seizures: Tongue biting   Seizure risk factors: Denies   Previous ASMs: Phenobarbital, Levetiracetam, Dilantin,   Currenty ASMs: Phenobarbital, and Levetiracetam   ASMs side effects: None   Brain Images: None available for review   Previous EEGs: EEG 2013: Normal    OTHER MEDICAL CONDITIONS: Seizure disorder, recent fall and hip fracture, Hypertension, osteoporosis, anxiety/depression   REVIEW OF SYSTEMS: Full 14 system review of systems performed and negative  with exception of: As noted in the HPI   ALLERGIES: Allergies  Allergen Reactions   Aspirin     Allergic to non coated ASA Caused gastric ulcers    Nsaids Other (See Comments)    Directed by MD to not take.    Singulair [Montelukast Sodium] Other (See Comments)    Became sweaty and nuaseated    HOME MEDICATIONS: Outpatient Medications Prior to Visit  Medication Sig Dispense Refill    acetaminophen (TYLENOL) 500 MG tablet Take 1,000 mg by mouth every 6 (six) hours as needed for moderate pain.     alendronate (FOSAMAX) 35 MG tablet TAKE ONE TABLET BY MOUTH ONCE A WEEK IN THE MORNING WITH A FULL GLASS OF WATER ON AN EMPTY STOMACH. DO NOT LAY DOWN FOR 30 MINUTES. 5 tablet 5   amLODipine (NORVASC) 2.5 MG tablet Take 1 tablet (2.5 mg total) by mouth daily. 90 tablet 1   Ascorbic Acid (VITAMIN C PO) Take 1 tablet by mouth daily.     BIOTIN 5000 PO Take 5,000 mcg by mouth once. daily     Calcium Carb-Cholecalciferol 1000-800 MG-UNIT TABS Take 1 tablet by mouth 3 (three) times daily.      Cholecalciferol (VITAMIN D) 50 MCG (2000 UT) CAPS Take 1 capsule by mouth daily.     clonazePAM (KLONOPIN) 1 MG tablet Take 1 tablet (1 mg total) by mouth 2 (two) times daily. 60 tablet 5   ferrous sulfate 325 (65 FE) MG tablet Take 325 mg by mouth once. daily     Humidifier MISC Large humidifier. 1 each 0   MAGNESIUM PO Take 500 mg by mouth daily.      metoprolol tartrate (LOPRESSOR) 25 MG tablet Take 1 tablet (25 mg total) by mouth 2 (two) times daily. 180 tablet 1   mirtazapine (REMERON) 45 MG tablet Take 1 tablet (45 mg total) by mouth at bedtime. 30 tablet 5   Multiple Vitamin (MULTIVITAMIN) tablet Take 1 tablet by mouth daily.     zinc gluconate 50 MG tablet Take 50 mg by mouth daily.     levETIRAcetam (KEPPRA) 750 MG tablet Take 2 tablets (1,500 mg total) by mouth 2 (two) times daily. 360 tablet 3   PHENObarbital (LUMINAL) 100 MG tablet Take 2 tabs Monday-Friday and 1 tab Saturday/Sunday 180 tablet 1   No facility-administered medications prior to visit.    PAST MEDICAL HISTORY: Past Medical History:  Diagnosis Date   Abnormal facial hair 05/11/2011   Anxiety and depression    GAD (generalized anxiety disorder)    Insomnia    Psychotic disorder (HCC)    Seizure disorder (HCC)    stress related seizures. 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[facility])     PAST SURGICAL  HISTORY: Past Surgical History:  Procedure Laterality Date   ABDOMINAL HYSTERECTOMY     BREAST BIOPSY Left 08/24/2022   [place] LT BREAST BX W LOC DEV 1ST LESION IMG BX SPEC US GUIDE 08/24/2022 [facility]UL[facility]   CESAREAN SECTION     CHOLECYSTECTOMY     COLONOSCOPY WITH PROPOFOL N/A 02/15/2019   Procedure: COLONOSCOPY WITH PROPOFOL;  Surgeon: Corbin Ade, MD;  Location: [facility];  Service: Endoscopy;  Laterality: N/A;  2:45pm   POLYPECTOMY  02/15/2019   Procedure: POLYPECTOMY;  Surgeon: Corbin Ade, MD;  Location: [facility];  Service: Endoscopy;;   TOTAL HIP ARTHROPLASTY Left 01/27/2022   Procedure: TOTAL HIP ARTHROPLASTY ANTERIOR APPROACH;  Surgeon: Gershon Mussel  M, MD;  Location: [facility] OR;  Service: Orthopedics;  Laterality: Left;    FAMILY HISTORY: Family History  Problem Relation Age of Onset   Breast cancer Mother 66   Coronary artery disease Father    Stroke Father 24   Breast cancer Sister 56   Breast cancer Sister    Breast cancer Sister    Diabetes Brother    Heart attack Brother    Colon cancer Neg Hx     SOCIAL HISTORY: Social History   Socioeconomic History   Marital status: Married    Spouse name: Aurther Loft   Number of children: 3   Years of education: 12th   Highest education level: 12th grade  Occupational History   Occupation: disabled  Tobacco Use   Smoking status: Never   Smokeless tobacco: Never  Vaping Use   Vaping status: Never Used  Substance and Sexual Activity   Alcohol use: No   Drug use: No   Sexual activity: Yes    Birth control/protection: Surgical  Other Topics Concern   Not on file  Social History Narrative   Right handed   Caffeine-1 cup daily   Lives with husband   Social Determinants of Health   Financial Resource Strain: Low Risk  (07/08/2022)   Overall Financial Resource Strain (CARDIA)    Difficulty of Paying Living Expenses: Not hard at all  Food Insecurity: No Food Insecurity (07/08/2022)   Hunger Vital Sign    Worried  About Running Out of Food in the Last Year: Never true    Ran Out of Food in the Last Year: Never true  Transportation Needs: No Transportation Needs (07/08/2022)   PRAPARE - Administrator, Civil Service (Medical): No    Lack of Transportation (Non-Medical): No  Physical Activity: Inactive (07/08/2022)   Exercise Vital Sign    Days of Exercise per Week: 0 days    Minutes of Exercise per Session: 0 min  Stress: No Stress Concern Present (07/08/2022)   [employer] of Occupational Health - Occupational Stress Questionnaire    Feeling of Stress : Not at all  Social Connections: Moderately Isolated (07/08/2022)   Social Connection and Isolation Panel [NHANES]    Frequency of Communication with Friends and Family: Three times a week    Frequency of Social Gatherings with Friends and Family: Twice a week    Attends Religious Services: Never    Database administrator or Organizations: No    Attends Banker Meetings: Never    Marital Status: Married  Catering manager Violence: Not At Risk (07/08/2022)   Humiliation, Afraid, Rape, and Kick questionnaire    Fear of Current or Ex-Partner: No    Emotionally Abused: No    Physically Abused: No    Sexually Abused: No    PHYSICAL EXAM  GENERAL EXAM/CONSTITUTIONAL: Vitals:  Vitals:   11/18/22 1304  BP: 112/68  Weight: 122 lb (55.3 kg)  Height: 5\' 2"  (1.575 m)   Body mass index is 22.31 kg/m. Wt Readings from Last 3 Encounters:  11/18/22 122 lb (55.3 kg)  10/19/22 119 lb (54 kg)  10/05/22 117 lb 1.9 oz (53.1 kg)   Patient is in no distress; well developed, nourished and groomed; neck is supple  MUSCULOSKELETAL: Gait, strength, tone, movements noted in Neurologic exam below  NEUROLOGIC: MENTAL STATUS:     07/08/2022   11:02 AM  MMSE - Mini Mental State Exam  Not completed: Unable to complete   awake, alert,  oriented to person, place and time recent and remote memory intact normal attention and  concentration language fluent, comprehension intact, naming intact fund of knowledge appropriate  CRANIAL NERVE:  2nd, 3rd, 4th, 6th - Visual fields full to confrontation, extraocular muscles intact, no nystagmus 5th - facial sensation symmetric 7th - facial strength symmetric 8th - hearing intact 9th - palate elevates symmetrically, uvula midline 11th - shoulder shrug symmetric 12th - tongue protrusion midline  MOTOR:  normal bulk and tone, at least antigravity in the BUE and BLEs  SENSORY:  normal and symmetric to light touch  COORDINATION:  finger-nose-finger, fine finger movements normal  GAIT/STATION:  Using a walking     DIAGNOSTIC DATA (LABS, IMAGING, TESTING) - I reviewed patient records, labs, notes, testing and imaging myself where available.  Lab Results  Component Value Date   WBC 6.7 10/05/2022   HGB 12.1 10/05/2022   HCT 35.6 10/05/2022   MCV 98 (H) 10/05/2022   PLT 418 10/05/2022      Component Value Date/Time   NA 142 10/05/2022 1111   K 3.6 10/05/2022 1111   CL 101 10/05/2022 1111   CO2 27 10/05/2022 1111   GLUCOSE 88 10/05/2022 1111   GLUCOSE 109 (H) 02/02/2022 0344   BUN 4 (L) 10/05/2022 1111   CREATININE 0.47 (L) 10/05/2022 1111   CREATININE 0.49 (L) 11/29/2019 0926   CALCIUM 9.1 10/05/2022 1111   PROT 7.0 10/19/2022 1140   ALBUMIN 3.9 10/19/2022 1140   AST 35 10/19/2022 1140   ALT 16 10/19/2022 1140   ALKPHOS 210 (H) 10/19/2022 1140   BILITOT <0.2 10/19/2022 1140   GFRNONAA >60 02/02/2022 0344   GFRNONAA 105 11/29/2019 0926   GFRAA 120 02/27/2020 1508   GFRAA 122 11/29/2019 0926   Lab Results  Component Value Date   CHOL 174 12/09/2021   HDL 64 12/09/2021   LDLCALC 96 12/09/2021   TRIG 72 12/09/2021   Lab Results  Component Value Date   HGBA1C 5.6 02/27/2020   Lab Results  Component Value Date   VITAMINB12 495 12/09/2021   Lab Results  Component Value Date   TSH 1.730 05/11/2022    EEG 2013: Normal   EEG 2024:  Left temporal sharps    ASSESSMENT AND PLAN  62 y.o. year old female  with history of longstanding seizure disorder, anxiety/depression, recent fall with left hip fracture s/p surgery who is presenting for follow up.  Her recent EEG showed left temporal discharges.  She continues to have breakthrough seizures despite phenobarbital 200 mg Monday to Friday and 100 mg Saturday and Sunday and Keppra 1500 mg twice daily.  My plan will be to increase Keppra to 2000 mg twice daily and increase her phenobarbital to 200 mg daily.  We also discussed VNS therapy and additional information given to patient.  Due to her uncontrolled seizures and abnormal EEG, will obtain MRI brain w wo epilepsy protocol.  I will see the patient in 6 months for follow-up but she understands to contact me sooner if she is having worsening seizure frequency.    1. Seizure disorder (HCC)   2. Therapeutic drug monitoring      Patient Instructions  Increase Keppra to 2000 mg twice daily  Increase Phenobarbital to 200 mg nightly  Will check labs today  MRI Brain with and without contrast  Follow up in 6 months or sooner if worse    Per [facility] statutes, patients with seizures are not allowed to drive until  they have been seizure-free for six months.  Other recommendations include using caution when using heavy equipment or power tools. Avoid working on ladders or at heights. Take showers instead of baths.  Do not swim alone.  Ensure the water temperature is not too high on the home water heater. Do not go swimming alone. Do not lock yourself in a room alone (i.e. bathroom). When caring for infants or small children, sit down when holding, feeding, or changing them to minimize risk of injury to the child in the event you have a seizure. Maintain good sleep hygiene. Avoid alcohol.  Also recommend adequate sleep, hydration, good diet and minimize stress.   During the Seizure  - First, ensure adequate ventilation and  place patients on the floor on their left side  Loosen clothing around the neck and ensure the airway is patent. If the patient is clenching the teeth, do not force the mouth open with any object as this can cause severe damage - Remove all items from the surrounding that can be hazardous. The patient may be oblivious to what's happening and may not even know what he or she is doing. If the patient is confused and wandering, either gently guide him/her away and block access to outside areas - Reassure the individual and be comforting - Call 911. In most cases, the seizure ends before EMS arrives. However, there are cases when seizures may last over 3 to 5 minutes. Or the individual may have developed breathing difficulties or severe injuries. If a pregnant patient or a person with diabetes develops a seizure, it is prudent to call an ambulance. - Finally, if the patient does not regain full consciousness, then call EMS. Most patients will remain confused for about 45 to 90 minutes after a seizure, so you must use judgment in calling for help. - Avoid restraints but make sure the patient is in a bed with padded side rails - Place the individual in a lateral position with the neck slightly flexed; this will help the saliva drain from the mouth and prevent the tongue from falling backward - Remove all nearby furniture and other hazards from the area - Provide verbal assurance as the individual is regaining consciousness - Provide the patient with privacy if possible - Call for help and start treatment as ordered by the caregiver   After the Seizure (Postictal Stage)  After a seizure, most patients experience confusion, fatigue, muscle pain and/or a headache. Thus, one should permit the individual to sleep. For the next few days, reassurance is essential. Being calm and helping reorient the person is also of importance.  Most seizures are painless and end spontaneously. Seizures are not harmful to  others but can lead to complications such as stress on the lungs, brain and the heart. Individuals with prior lung problems may develop labored breathing and respiratory distress.     Orders Placed This Encounter  Procedures   MR BRAIN W WO CONTRAST   CMP   Phenobarbital level   Levetiracetam level    Meds ordered this encounter  Medications   levETIRAcetam (KEPPRA) 1000 MG tablet    Sig: Take 2 tablets (2,000 mg total) by mouth 2 (two) times daily.    Dispense:  360 tablet    Refill:  3   PHENObarbital (LUMINAL) 100 MG tablet    Sig: Take 2 tablets (200 mg total) by mouth at bedtime. Take 2 tabs Monday-Friday and 1 tab Saturday/Sunday    Dispense:  180 tablet  Refill:  3    Please updated this refill. Disregard the earlier refill of this medication    Return in about 6 months (around 05/19/2023).    Windell Norfolk, MD 11/18/2022, 2:14 PM  [facility] [address] [place], [place] [address] [phone]

## 2022-11-18 NOTE — Telephone Encounter (Signed)
[employer] medicare/[place] medicaid NPR sent to [facility] [phone]

## 2022-11-20 DIAGNOSIS — M6281 Muscle weakness (generalized): Secondary | ICD-10-CM | POA: Diagnosis not present

## 2022-11-20 DIAGNOSIS — S72042D Displaced fracture of base of neck of left femur, subsequent encounter for closed fracture with routine healing: Secondary | ICD-10-CM | POA: Diagnosis not present

## 2022-11-20 DIAGNOSIS — Z9181 History of falling: Secondary | ICD-10-CM | POA: Diagnosis not present

## 2022-11-20 DIAGNOSIS — R2689 Other abnormalities of gait and mobility: Secondary | ICD-10-CM | POA: Diagnosis not present

## 2022-11-20 LAB — COMPREHENSIVE METABOLIC PANEL
ALT: 23 [IU]/L (ref 0–32)
AST: 41 [IU]/L — ABNORMAL HIGH (ref 0–40)
Albumin: 4.4 g/dL (ref 3.9–4.9)
Alkaline Phosphatase: 189 [IU]/L — ABNORMAL HIGH (ref 44–121)
BUN/Creatinine Ratio: 8 — ABNORMAL LOW (ref 12–28)
BUN: 4 mg/dL — ABNORMAL LOW (ref 8–27)
Bilirubin Total: 0.2 mg/dL (ref 0.0–1.2)
CO2: 25 mmol/L (ref 20–29)
Calcium: 9.9 mg/dL (ref 8.7–10.3)
Chloride: 102 mmol/L (ref 96–106)
Creatinine, Ser: 0.52 mg/dL — ABNORMAL LOW (ref 0.57–1.00)
Globulin, Total: 3.1 g/dL (ref 1.5–4.5)
Glucose: 91 mg/dL (ref 70–99)
Potassium: 3.6 mmol/L (ref 3.5–5.2)
Sodium: 143 mmol/L (ref 134–144)
Total Protein: 7.5 g/dL (ref 6.0–8.5)
eGFR: 104 mL/min/{1.73_m2} (ref >59)

## 2022-11-20 LAB — LEVETIRACETAM LEVEL: Levetiracetam Lvl: 76.1 ug/mL — ABNORMAL HIGH (ref 10.0–40.0)

## 2022-11-20 LAB — PHENOBARBITAL LEVEL: Phenobarbital, Serum: 37 ug/mL (ref 15–40)

## 2022-11-23 ENCOUNTER — Telehealth: Payer: Self-pay | Admitting: Neurology

## 2022-11-23 MED ORDER — PHENOBARBITAL 100 MG PO TABS: 200.0000 mg | ORAL_TABLET | Freq: Every day | ORAL | 3 refills | Status: DC

## 2022-11-23 NOTE — Telephone Encounter (Signed)
Pt called wanting to discuss how she is supposed to take her levETIRAcetam (KEPPRA) 1000 MG tablet and her PHENObarbital (LUMINAL) 100 MG tablet Please advise.

## 2022-11-23 NOTE — Telephone Encounter (Signed)
Discussed with Dr. Teresa Coombs patients keppra and he did advise her to take (3) 750 mg Keppra tablets 2x daily until gone and then pick up new script for 1000 mg Keppra and take 2 tablets (2000 mg) 2x daily. He also increased her Phenobarbital to 200 mg nightly and she stated there was a problem at the pharmacy with it. Advised I would follow up with her pharmacy. I also discussed and reviewed VNS with her and she was in agreement to have [facility] reach out with additional information. Consent signed and faxed. Call to pharmacy and they state script still reflects old prescription. Advised to cancel that and new script would be sent over. Patient is fine with a mychart message follow up.

## 2022-11-24 ENCOUNTER — Other Ambulatory Visit: Payer: Self-pay

## 2022-11-24 DIAGNOSIS — R748 Abnormal levels of other serum enzymes: Secondary | ICD-10-CM

## 2022-11-25 NOTE — Addendum Note (Signed)
Addended by: Tiffany Kocher on: 11/25/2022 05:51 AM   Modules accepted: Orders

## 2022-11-29 ENCOUNTER — Telehealth: Payer: Self-pay

## 2022-11-29 NOTE — Telephone Encounter (Signed)
[facility] sent fax requesting updated insurance and ICD codes. Form faxed

## 2022-12-01 NOTE — Telephone Encounter (Signed)
Surgical and updated notes faxed to Jacklynn Bue [phone]

## 2022-12-03 DIAGNOSIS — S32009A Unspecified fracture of unspecified lumbar vertebra, initial encounter for closed fracture: Secondary | ICD-10-CM | POA: Diagnosis not present

## 2022-12-03 DIAGNOSIS — R29890 Loss of height: Secondary | ICD-10-CM | POA: Diagnosis not present

## 2022-12-03 DIAGNOSIS — S3992XA Unspecified injury of lower back, initial encounter: Secondary | ICD-10-CM | POA: Diagnosis not present

## 2022-12-03 DIAGNOSIS — M4856XD Collapsed vertebra, not elsewhere classified, lumbar region, subsequent encounter for fracture with routine healing: Secondary | ICD-10-CM | POA: Diagnosis not present

## 2022-12-03 DIAGNOSIS — M545 Low back pain, unspecified: Secondary | ICD-10-CM | POA: Diagnosis not present

## 2022-12-03 DIAGNOSIS — M47816 Spondylosis without myelopathy or radiculopathy, lumbar region: Secondary | ICD-10-CM | POA: Diagnosis not present

## 2022-12-03 DIAGNOSIS — W010XXA Fall on same level from slipping, tripping and stumbling without subsequent striking against object, initial encounter: Secondary | ICD-10-CM | POA: Diagnosis not present

## 2022-12-03 DIAGNOSIS — Z96642 Presence of left artificial hip joint: Secondary | ICD-10-CM | POA: Diagnosis not present

## 2022-12-07 ENCOUNTER — Other Ambulatory Visit: Payer: Self-pay | Admitting: Family Medicine

## 2022-12-08 NOTE — Telephone Encounter (Signed)
Returned call to patient, she said she got a call from a lady named [place] but the phone number wasn't working. Call back to patient and informed of Dr. Patric Dykes number and that Raoul Pitch is his referral coordinator. Reviewed correct contact number and seh said she just called and left a message for her.

## 2022-12-08 NOTE — Telephone Encounter (Signed)
Pt asking for a call  from the nurse to give the phone number for place having VNS. Have called the number given and the phone number will not go through.

## 2022-12-17 ENCOUNTER — Other Ambulatory Visit: Payer: Self-pay

## 2022-12-17 ENCOUNTER — Telehealth: Payer: Self-pay

## 2022-12-17 ENCOUNTER — Ambulatory Visit (INDEPENDENT_AMBULATORY_CARE_PROVIDER_SITE_OTHER): Payer: 59 | Admitting: Family Medicine

## 2022-12-17 ENCOUNTER — Encounter: Payer: Self-pay | Admitting: Family Medicine

## 2022-12-17 VITALS — BP 141/84 | HR 71 | Ht 62.0 in | Wt 118.0 lb

## 2022-12-17 DIAGNOSIS — R748 Abnormal levels of other serum enzymes: Secondary | ICD-10-CM

## 2022-12-17 DIAGNOSIS — R2681 Unsteadiness on feet: Secondary | ICD-10-CM | POA: Diagnosis not present

## 2022-12-17 DIAGNOSIS — S32040D Wedge compression fracture of fourth lumbar vertebra, subsequent encounter for fracture with routine healing: Secondary | ICD-10-CM | POA: Diagnosis not present

## 2022-12-17 DIAGNOSIS — S32040S Wedge compression fracture of fourth lumbar vertebra, sequela: Secondary | ICD-10-CM

## 2022-12-17 DIAGNOSIS — G40309 Generalized idiopathic epilepsy and epileptic syndromes, not intractable, without status epilepticus: Secondary | ICD-10-CM

## 2022-12-17 DIAGNOSIS — Z9181 History of falling: Secondary | ICD-10-CM | POA: Diagnosis not present

## 2022-12-17 DIAGNOSIS — Z0001 Encounter for general adult medical examination with abnormal findings: Secondary | ICD-10-CM | POA: Diagnosis not present

## 2022-12-17 MED ORDER — OXYCODONE-ACETAMINOPHEN 5-325 MG PO TABS: ORAL_TABLET | ORAL | 0 refills | Status: DC

## 2022-12-17 NOTE — Progress Notes (Signed)
    Sandra Riley     MRN: [id number]      DOB: 02-Jun-1960  Chief Complaint  Patient presents with   Annual Exam    CPE     HPI: Patient is in for annual physical exam. Immunization is reviewed , and  updated , she is UTD Recent compression fracture of L4 on 11/01, has ongoing pain med needs  Requests bed rail due to seizure activity being during sleep which increases risk of falls and trauma while in bed sleeping which she has also consistently reported Recent testing substantiates her history and she is referred for VNS  PE: BP (!) 141/84 (BP Location: Left Arm, Patient Position: Sitting, Cuff Size: Normal)   Pulse 71   Ht 5\' 2"  (1.575 m)   Wt 118 lb 0.6 oz (53.5 kg)   SpO2 98%   BMI 21.59 kg/m   Pleasant  female, alert and oriented x 3, in no cardio-pulmonary distress.Pt in pain Afebrile. HEENT No facial trauma or asymetry. Sinuses non tender.  Extra occullar muscles intact.. External ears normal, . Neck: decreased ROM, no adenopathy,JVD or thyromegaly.No bruits.  Chest: Clear to ascultation bilaterally.No crackles or wheezes. Non tender to palpation  Cardiovascular system; Heart sounds normal,  S1 and  S2 ,no S3.  No murmur, or thrill. Peripheral pulses normal.  Abdomen: Soft, non tender   Musculoskeletal exam: Decreased  ROM of lumbar spine, adequate in shoulders and knees. No deformity ,swelling or crepitus noted. muscle wasting noted.   Neurologic: Cranial nerves 2 to 12 intact. Power, tone ,sensation  normal throughout. disturbance in gait. No tremor.  Skin: Intact, no ulceration, erythema , scaling or rash noted. Pigmentation normal throughout  Psych; Normal mood and affect. Judgement and concentration normal   Assessment & Plan:  Encounter for Medicare annual examination with abnormal findings Annual exam as documented.  Immunization and cancer screening needs are specifically addressed at this visit.   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 PPDM reviewed. Discussed fact that I will not rx narcotic med indefinitely, and have reduced dose to 1 daily, fact that she  is on klonopin chronically , increasing risk of resp depression is also discussed . 20 tabs prescribed, also referred to PT

## 2022-12-17 NOTE — Telephone Encounter (Signed)
Copied from CRM [id number]. Topic: General - Other >> Dec 17, 2022 12:53 PM Sandra Riley wrote: Reason for CRM: Pt called in regarding her surgery and needing assistance and curious if Dr. Lodema Hong called anything in for pain

## 2022-12-17 NOTE — Patient Instructions (Addendum)
F/U mid March , call if you need me sooner  Nurse pls document recent Flu vaccine and Covid vaccines, [facility]   You should hear from GI re when you next need blood work and visit  We will send request for bed rails to [place]   Please schedule dexa at checkout  You are referred to  PT twice weekly for 4 weeks due to unsteady gait and repeat falls  Very important that you use your  assistive device all the time   Please increase food intake so weight stabilizes or you gain weight  I do recommend going ahead with new treatment option , VNS, for better seizure control, Doctor doing it wkll provide all the info you need  Please message when you need in home help

## 2022-12-21 DIAGNOSIS — M6281 Muscle weakness (generalized): Secondary | ICD-10-CM | POA: Diagnosis not present

## 2022-12-21 DIAGNOSIS — Z9181 History of falling: Secondary | ICD-10-CM | POA: Diagnosis not present

## 2022-12-21 DIAGNOSIS — S72042D Displaced fracture of base of neck of left femur, subsequent encounter for closed fracture with routine healing: Secondary | ICD-10-CM | POA: Diagnosis not present

## 2022-12-21 DIAGNOSIS — R2689 Other abnormalities of gait and mobility: Secondary | ICD-10-CM | POA: Diagnosis not present

## 2022-12-27 ENCOUNTER — Other Ambulatory Visit: Payer: Self-pay | Admitting: Family Medicine

## 2022-12-27 DIAGNOSIS — S32040S Wedge compression fracture of fourth lumbar vertebra, sequela: Secondary | ICD-10-CM | POA: Insufficient documentation

## 2022-12-27 DIAGNOSIS — R2681 Unsteadiness on feet: Secondary | ICD-10-CM | POA: Insufficient documentation

## 2022-12-27 DIAGNOSIS — Z0001 Encounter for general adult medical examination with abnormal findings: Secondary | ICD-10-CM | POA: Insufficient documentation

## 2022-12-27 NOTE — Assessment & Plan Note (Signed)
Annual exam as documented. . Immunization and cancer screening needs are specifically addressed at this visit.

## 2022-12-27 NOTE — Assessment & Plan Note (Signed)
Has h/o repeated falls with unsteady gait, refer to pT

## 2022-12-27 NOTE — Assessment & Plan Note (Signed)
Uncontrolled and occur during sleep Request for bed rails to [place] in hope that ins will cover

## 2022-12-27 NOTE — Assessment & Plan Note (Signed)
Still c/o significant pain. PPDM reviewed. Discussed fact that I will not rx narcotic med indefinitely, and have reduced dose to 1 daily, fact that she  is on klonopin chronically , increasing risk of resp depression is also discussed . 20 tabs prescribed, also referred to PT

## 2022-12-31 ENCOUNTER — Other Ambulatory Visit (HOSPITAL_COMMUNITY)
Admission: RE | Admit: 2022-12-31 | Discharge: 2022-12-31 | Disposition: A | Discharge status: Home / Self Care | Payer: 59 | Attending: Gastroenterology | Admitting: Gastroenterology

## 2022-12-31 DIAGNOSIS — R748 Abnormal levels of other serum enzymes: Secondary | ICD-10-CM | POA: Insufficient documentation

## 2022-12-31 LAB — FERRITIN: Ferritin: 46 ng/mL (ref 11–307)

## 2022-12-31 LAB — IRON AND TIBC
Iron: 55 ug/dL (ref 28–170)
Saturation Ratios: 16 % (ref 10.4–31.8)
TIBC: 342 ug/dL (ref 250–450)
UIBC: 287 ug/dL

## 2022-12-31 LAB — HEPATITIS B SURFACE ANTIGEN: Hepatitis B Surface Ag: NONREACTIVE

## 2023-01-01 LAB — HEPATIC FUNCTION PANEL
ALT: 24 U/L (ref 0–44)
AST: 27 U/L (ref 15–41)
Albumin: 4.1 g/dL (ref 3.5–5.0)
Alkaline Phosphatase: 179 U/L — ABNORMAL HIGH (ref 38–126)
Bilirubin, Direct: 0 mg/dL (ref 0.0–0.2)
Total Bilirubin: 0 mg/dL (ref <1.2)
Total Protein: 8 g/dL (ref 6.5–8.1)

## 2023-01-01 LAB — IGG, IGA, IGM
IgA: 235 mg/dL (ref 87–352)
IgG (Immunoglobin G), Serum: 1680 mg/dL — ABNORMAL HIGH (ref 586–1602)
IgM (Immunoglobulin M), Srm: 96 mg/dL (ref 26–217)

## 2023-01-01 LAB — GAMMA GT: GGT: 320 U/L — ABNORMAL HIGH (ref 7–50)

## 2023-01-02 LAB — ANTI-SMOOTH MUSCLE ANTIBODY, IGG: F-Actin IgG: 13 U (ref 0–19)

## 2023-01-02 LAB — TISSUE TRANSGLUTAMINASE, IGA: Tissue Transglutaminase Ab, IgA: <2 U/mL (ref 0–3)

## 2023-01-06 ENCOUNTER — Ambulatory Visit
Admission: RE | Admit: 2023-01-06 | Discharge: 2023-01-06 | Disposition: A | Discharge status: Home / Self Care | Payer: 59 | Source: Ambulatory Visit | Attending: Neurology | Admitting: Neurology

## 2023-01-06 DIAGNOSIS — G40219 Localization-related (focal) (partial) symptomatic epilepsy and epileptic syndromes with complex partial seizures, intractable, without status epilepticus: Secondary | ICD-10-CM | POA: Diagnosis not present

## 2023-01-06 MED ORDER — GADOPICLENOL 0.5 MMOL/ML IV SOLN
5.0000 mL | Freq: Once | INTRAVENOUS | Status: AC | PRN
  Administered 2023-01-06: 5 mL via INTRAVENOUS

## 2023-01-10 ENCOUNTER — Other Ambulatory Visit: Payer: Self-pay | Admitting: Family Medicine

## 2023-01-11 ENCOUNTER — Other Ambulatory Visit: Payer: Self-pay | Admitting: Neurosurgery

## 2023-01-12 ENCOUNTER — Other Ambulatory Visit: Payer: Self-pay | Admitting: Gastroenterology

## 2023-01-12 DIAGNOSIS — R748 Abnormal levels of other serum enzymes: Secondary | ICD-10-CM

## 2023-01-30 ENCOUNTER — Other Ambulatory Visit: Payer: Self-pay | Admitting: Internal Medicine

## 2023-01-31 ENCOUNTER — Other Ambulatory Visit: Payer: Self-pay | Admitting: Family Medicine

## 2023-01-31 DIAGNOSIS — F39 Unspecified mood [affective] disorder: Secondary | ICD-10-CM

## 2023-02-10 ENCOUNTER — Other Ambulatory Visit: Payer: Self-pay | Admitting: Family Medicine

## 2023-02-10 ENCOUNTER — Telehealth: Payer: Self-pay | Admitting: Neurology

## 2023-02-10 ENCOUNTER — Encounter: Payer: Self-pay | Admitting: Internal Medicine

## 2023-02-10 ENCOUNTER — Other Ambulatory Visit: Payer: Self-pay | Admitting: Neurosurgery

## 2023-02-10 DIAGNOSIS — F39 Unspecified mood [affective] disorder: Secondary | ICD-10-CM

## 2023-02-10 NOTE — Telephone Encounter (Signed)
 Copied from CRM [id number]. Topic: Clinical - Medication Refill >> Feb 10, 2023  4:17 PM Tonda B wrote: Most Recent Primary Care Visit:  Provider: ANTONETTA ROLLENE BRAVO  Department: [facility]  Visit Type: PHYSICAL  Date: 12/17/2022  Medication: ***  Has the patient contacted their pharmacy?  (Agent: If no, request that the patient contact the pharmacy for the refill. If patient does not wish to contact the pharmacy document the reason why and proceed with request.) (Agent: If yes, when and what did the pharmacy advise?)  Is this the correct pharmacy for this prescription?  If no, delete pharmacy and type the correct one.  This is the patient's preferred pharmacy:  [facility] - [place], [place] - [address] [place] [address] Phone: [phone] Fax: [phone]   Has the prescription been filled recently?   Is the patient out of the medication?   Has the patient been seen for an appointment in the last year OR does the patient have an upcoming appointment?   Can we respond through MyChart?   Agent: Please be advised that Rx refills may take up to 3 business days. We ask that you follow-up with your pharmacy.

## 2023-02-10 NOTE — Progress Notes (Signed)
 Surgical Instructions   Your procedure is scheduled on Thursday, 02/17/23. Report to [facility] Main Entrance A at 10:30 A.M., then check in with the Admitting office. Any questions or running late day of surgery: call [phone]  Questions prior to your surgery date: call [phone], Monday-Friday, 8am-4pm. If you experience any cold or flu symptoms such as cough, fever, chills, shortness of breath, etc. between now and your scheduled surgery, please notify us  at the above number.     Remember:  Do not eat or drink after midnight the night before your surgery   Take these medicines the morning of surgery with A SIP OF WATER   amLODipine  (NORVASC )  clonazePAM  (KLONOPIN )  levETIRAcetam  (KEPPRA )  metoprolol  tartrate (LOPRESSOR )   May take these medicines IF NEEDED: acetaminophen  (TYLENOL )    One week prior to surgery, STOP taking any Aspirin (unless otherwise instructed by your surgeon) Aleve, Naproxen, Ibuprofen, Motrin, Advil, Goody's, BC's, all herbal medications, fish oil, and non-prescription vitamins.                     Do NOT Smoke (Tobacco/Vaping) for 24 hours prior to your procedure.  If you use a CPAP at night, you may bring your mask/headgear for your overnight stay.   You will be asked to remove any contacts, glasses, piercing's, hearing aid's, dentures/partials prior to surgery. Please bring cases for these items if needed.    Patients discharged the day of surgery will not be allowed to drive home, and someone needs to stay with them for 24 hours.  SURGICAL WAITING ROOM VISITATION Patients may have no more than 2 support people in the waiting area - these visitors may rotate.   Pre-op nurse will coordinate an appropriate time for 1 ADULT support person, who may not rotate, to accompany patient in pre-op.  Children under the age of 40 must have an adult with them who is not the patient and must remain in the main waiting area with an adult.  If the patient  needs to stay at the hospital during part of their recovery, the visitor guidelines for inpatient rooms apply.  Please refer to the [facility] website for the visitor guidelines for any additional information.   If you received a COVID test during your pre-op visit  it is requested that you wear a mask when out in public, stay away from anyone that may not be feeling well and notify your surgeon if you develop symptoms. If you have been in contact with anyone that has tested positive in the last 10 days please notify you surgeon.      Pre-operative CHG Bathing Instructions   You can play a key role in reducing the risk of infection after surgery. Your skin needs to be as free of germs as possible. You can reduce the number of germs on your skin by washing with CHG (chlorhexidine  gluconate) soap before surgery. CHG is an antiseptic soap that kills germs and continues to kill germs even after washing.   DO NOT use if you have an allergy to chlorhexidine /CHG or antibacterial soaps. If your skin becomes reddened or irritated, stop using the CHG and notify one of our RNs at [phone].              TAKE A SHOWER THE NIGHT BEFORE SURGERY AND THE DAY OF SURGERY    Please keep in mind the following:  DO NOT shave, including legs and underarms, 48 hours prior to surgery.   You  may shave your face before/day of surgery.  Place clean sheets on your bed the night before surgery Use a clean washcloth (not used since being washed) for each shower. DO NOT sleep with pet's night before surgery.  CHG Shower Instructions:  Wash your face and private area with normal soap. If you choose to wash your hair, wash first with your normal shampoo.  After you use shampoo/soap, rinse your hair and body thoroughly to remove shampoo/soap residue.  Turn the water  OFF and apply half the bottle of CHG soap to a CLEAN washcloth.  Apply CHG soap ONLY FROM YOUR NECK DOWN TO YOUR TOES (washing for 3-5 minutes)  DO NOT  use CHG soap on face, private areas, open wounds, or sores.  Pay special attention to the area where your surgery is being performed.  If you are having back surgery, having someone wash your back for you may be helpful. Wait 2 minutes after CHG soap is applied, then you may rinse off the CHG soap.  Pat dry with a clean towel  Put on clean pajamas    Additional instructions for the day of surgery: DO NOT APPLY any lotions, deodorants, cologne, or perfumes.   Do not wear jewelry or makeup Do not wear nail polish, gel polish, artificial nails, or any other type of covering on natural nails (fingers and toes) Do not bring valuables to the hospital. [facility] is not responsible for valuables/personal belongings. Put on clean/comfortable clothes.  Please brush your teeth.  Ask your nurse before applying any prescription medications to the skin.

## 2023-02-10 NOTE — Telephone Encounter (Unsigned)
 Copied from CRM [id number]. Topic: Clinical - Medication Refill >> Feb 10, 2023  1:04 PM Carlatta H wrote: Most Recent Primary Care Visit:  Provider: ANTONETTA ROLLENE BRAVO  Department: [facility]  Visit Type: PHYSICAL  Date: 12/17/2022  Medication: clonazePAM  (KLONOPIN ) 1 MG tablet [[id number]]  Has the patient contacted their pharmacy? Yes (Agent: If no, request that the patient contact the pharmacy for the refill. If patient does not wish to contact the pharmacy document the reason why and proceed with request.) (Agent: If yes, when and what did the pharmacy advise?)  Is this the correct pharmacy for this prescription? Yes If no, delete pharmacy and type the correct one.  This is the patient's preferred pharmacy:  [facility] - [place], [place] - [address] [place] [address] Phone: [phone] Fax: [phone]   Has the prescription been filled recently? No  Is the patient out of the medication? Yes  Has the patient been seen for an appointment in the last year OR does the patient have an upcoming appointment? Yes  Can we respond through MyChart? Yes  Agent: Please be advised that Rx refills may take up to 3 business days. We ask that you follow-up with your pharmacy.

## 2023-02-10 NOTE — Telephone Encounter (Signed)
 Call to PCP and they are closed today and tomorrow but patient has already called and requested this medication and medication request will be handled per phone staff at PCP. Mychart message sent to patient to inform

## 2023-02-10 NOTE — Telephone Encounter (Unsigned)
 Copied from CRM [id number]. Topic: Clinical - Medication Refill >> Feb 10, 2023  4:28 PM [name] wrote: Most Recent Primary Care Visit:  Provider: ANTONETTA QUANT E  Department: [facility]  Visit Type: PHYSICAL  Date: 12/17/2022  Medication: clonazePAM  (KLONOPIN ) 1 MG tablet   Has the patient contacted their pharmacy? Yes (Agent: If no, request that the patient contact the pharmacy for the refill. If patient does not wish to contact the pharmacy document the reason why and proceed with request.) (Agent: If yes, when and what did the pharmacy advise?)  Is this the correct pharmacy for this prescription? Yes If no, delete pharmacy and type the correct one.  This is the patient's preferred pharmacy:  [facility] - [place], [place] - [address] [place] [address] Phone: [phone] Fax: [phone]   Has the prescription been filled recently? No  Is the patient out of the medication? Yes  Has the patient been seen for an appointment in the last year OR does the patient have an upcoming appointment? Yes  Can we respond through MyChart? Yes  Agent: Please be advised that Rx refills may take up to 3 business days. We ask that you follow-up with your pharmacy.

## 2023-02-10 NOTE — Telephone Encounter (Signed)
 Pt states her PCP will be closed until the week of the 20th. Asking if Dr. Teresa Coombs can do a one time refill of clonazePAM (KLONOPIN) 1 MG tablet and send it to [facility] Requesting call back to discuss

## 2023-02-11 ENCOUNTER — Ambulatory Visit: Payer: Self-pay | Admitting: Family Medicine

## 2023-02-11 ENCOUNTER — Other Ambulatory Visit: Payer: Self-pay

## 2023-02-11 ENCOUNTER — Encounter (HOSPITAL_COMMUNITY): Payer: Self-pay

## 2023-02-11 ENCOUNTER — Encounter (HOSPITAL_COMMUNITY)
Admission: RE | Admit: 2023-02-11 | Discharge: 2023-02-11 | Disposition: A | Discharge status: Home / Self Care | Payer: 59 | Source: Ambulatory Visit | Attending: Neurosurgery | Admitting: Neurosurgery

## 2023-02-11 ENCOUNTER — Other Ambulatory Visit: Payer: Self-pay | Admitting: Family Medicine

## 2023-02-11 VITALS — BP 157/83 | HR 73 | Temp 98.3°F | Resp 18 | Ht 62.0 in | Wt 121.0 lb

## 2023-02-11 DIAGNOSIS — I10 Essential (primary) hypertension: Secondary | ICD-10-CM | POA: Diagnosis not present

## 2023-02-11 DIAGNOSIS — Z01812 Encounter for preprocedural laboratory examination: Secondary | ICD-10-CM | POA: Diagnosis not present

## 2023-02-11 DIAGNOSIS — Z01818 Encounter for other preprocedural examination: Secondary | ICD-10-CM

## 2023-02-11 HISTORY — DX: Essential (primary) hypertension: I10

## 2023-02-11 HISTORY — DX: Cardiac murmur, unspecified: R01.1

## 2023-02-11 HISTORY — DX: Headache, unspecified: R51.9

## 2023-02-11 LAB — CBC
HCT: 44.8 % (ref 36.0–46.0)
Hemoglobin: 14.4 g/dL (ref 12.0–15.0)
MCH: 32.7 pg (ref 26.0–34.0)
MCHC: 32.1 g/dL (ref 30.0–36.0)
MCV: 101.8 fL — ABNORMAL HIGH (ref 80.0–100.0)
Platelets: 425 10*3/uL — ABNORMAL HIGH (ref 150–400)
RBC: 4.4 MIL/uL (ref 3.87–5.11)
RDW: 11.9 % (ref 11.5–15.5)
WBC: 5.7 10*3/uL (ref 4.0–10.5)
nRBC: 0 % (ref 0.0–0.2)

## 2023-02-11 LAB — BASIC METABOLIC PANEL
Anion gap: 13 (ref 5–15)
BUN: <5 mg/dL — ABNORMAL LOW (ref 8–23)
CO2: 29 mmol/L (ref 22–32)
Calcium: 9.8 mg/dL (ref 8.9–10.3)
Chloride: 97 mmol/L — ABNORMAL LOW (ref 98–111)
Creatinine, Ser: 0.6 mg/dL (ref 0.44–1.00)
GFR, Estimated: >60 mL/min (ref >60)
Glucose, Bld: 113 mg/dL — ABNORMAL HIGH (ref 70–99)
Potassium: 3.4 mmol/L — ABNORMAL LOW (ref 3.5–5.1)
Sodium: 139 mmol/L (ref 135–145)

## 2023-02-11 NOTE — Telephone Encounter (Signed)
 Copied from CRM [id number]. Topic: Clinical - Medication Refill >> Feb 10, 2023  4:28 PM [name] wrote: Most Recent Primary Care Visit:  Provider: ANTONETTA QUANT E  Department: [facility]  Visit Type: PHYSICAL  Date: 12/17/2022  Medication: clonazePAM  (KLONOPIN ) 1 MG tablet   Has the patient contacted their pharmacy? Yes (Agent: If no, request that the patient contact the pharmacy for the refill. If patient does not wish to contact the pharmacy document the reason why and proceed with request.) (Agent: If yes, when and what did the pharmacy advise?)  Is this the correct pharmacy for this prescription? Yes If no, delete pharmacy and type the correct one.  This is the patient's preferred pharmacy:  [facility] - [place], [place] - [address] [place] [address] Phone: [phone] Fax: [phone]   Has the prescription been filled recently? No  Is the patient out of the medication? Yes  Has the patient been seen for an appointment in the last year OR does the patient have an upcoming appointment? Yes  Can we respond through MyChart? Yes  Agent: Please be advised that Rx refills may take up to 3 business days. We ask that you follow-up with your pharmacy.  02/11/23 1245- Call made to oncall provider, no answer 02/11/23 2:37p- Call made to on call provider, no answer

## 2023-02-11 NOTE — Progress Notes (Signed)
 PCP - Dr. Rollene Pesa Cardiologist - Dr. Vishnu Mallipeddi Neurologist- Dr. Pastor Falling  PPM/ICD - denies   Chest x-ray - 01/29/22 EKG - 05/20/22 Stress Test - denies ECHO - 06/15/22 Cardiac Cath - denies  Sleep Study - denies   DM- denies  Last dose of GLP1 agonist-  n/a   ASA/Blood Thinner Instructions: n/a   ERAS Protcol - no, NPO   COVID TEST- n/a   Anesthesia review: yes, cardiac hx, sees neurology for seizures  Patient denies shortness of breath, fever, cough and chest pain at PAT appointment   All instructions explained to the patient, with a verbal understanding of the material. Patient agrees to go over the instructions while at home for a better understanding.  The opportunity to ask questions was provided.

## 2023-02-11 NOTE — Telephone Encounter (Signed)
 Copied from CRM [id number]. Topic: Clinical - Medication Refill >> Feb 10, 2023  4:28 PM [name] wrote: Most Recent Primary Care Visit:  Provider: ANTONETTA QUANT E  Department: [facility]  Visit Type: PHYSICAL  Date: 12/17/2022  Medication: clonazePAM  (KLONOPIN ) 1 MG tablet   Has the patient contacted their pharmacy? Yes (Agent: If no, request that the patient contact the pharmacy for the refill. If patient does not wish to contact the pharmacy document the reason why and proceed with request.) (Agent: If yes, when and what did the pharmacy advise?)  Is this the correct pharmacy for this prescription? Yes If no, delete pharmacy and type the correct one.  This is the patient's preferred pharmacy:  [facility] - [place], [place] - [address] [place] [address] Phone: [phone] Fax: [phone]   Has the prescription been filled recently? No  Is the patient out of the medication? Yes  Has the patient been seen for an appointment in the last year OR does the patient have an upcoming appointment? Yes  Can we respond through MyChart? Yes  Agent: Please be advised that Rx refills may take up to 3 business days. We ask that you follow-up with your pharmacy.

## 2023-02-14 ENCOUNTER — Other Ambulatory Visit: Payer: Self-pay | Admitting: Family Medicine

## 2023-02-14 MED ORDER — CLONAZEPAM 1 MG PO TABS: 1.0000 mg | ORAL_TABLET | Freq: Two times a day (BID) | ORAL | 5 refills | Status: DC

## 2023-02-14 NOTE — Telephone Encounter (Signed)
 I will see if Dr. Teresa Coombs will send in a 1 week supply. You will need to follow up with your PCP.   Thank you, Chinonso Linker

## 2023-02-14 NOTE — Addendum Note (Signed)
 Addended by: Syliva Overman E on: 02/14/2023 10:26 AM   Modules accepted: Orders

## 2023-02-14 NOTE — Telephone Encounter (Unsigned)
 Copied from CRM [id number]. Topic: Clinical - Medication Refill >> Feb 14, 2023 10:22 AM Powell NOVAK wrote: Most Recent Primary Care Visit:  Provider: ANTONETTA QUANT E  Department: [facility]  Visit Type: PHYSICAL  Date: 12/17/2022  Medication: clonazePAM  (KLONOPIN ) 1 MG tablet  Has the patient contacted their pharmacy? Yes-refill needed (Agent: If no, request that the patient contact the pharmacy for the refill. If patient does not wish to contact the pharmacy document the reason why and proceed with request.) (Agent: If yes, when and what did the pharmacy advise?)  Is this the correct pharmacy for this prescription? yes If no, delete pharmacy and type the correct one.  This is the patient's preferred pharmacy:  [facility] - [place], [place] - [address] [place] [address] Phone: [phone] Fax: [phone]   Has the prescription been filled recently? yes  Is the patient out of the medication? yes  Has the patient been seen for an appointment in the last year OR does the patient have an upcoming appointment? yes  Can we respond through MyChart? yes  Agent: Please be advised that Rx refills may take up to 3 business days. We ask that you follow-up with your pharmacy.

## 2023-02-14 NOTE — Telephone Encounter (Signed)
 It seems like medication was sent by PCP

## 2023-02-15 ENCOUNTER — Encounter (HOSPITAL_COMMUNITY): Payer: Self-pay

## 2023-02-15 ENCOUNTER — Telehealth: Payer: Self-pay

## 2023-02-15 NOTE — Telephone Encounter (Signed)
 Mychart message sent.

## 2023-02-15 NOTE — Anesthesia Preprocedure Evaluation (Addendum)
 Anesthesia Evaluation  Patient identified by MRN, date of birth, ID band Patient awake    Reviewed: Allergy & Precautions, NPO status , Patient's Chart, lab work & pertinent test results  History of Anesthesia Complications Negative for: history of anesthetic complications  Airway Mallampati: III  TM Distance: >3 FB Neck ROM: Full   Comment: Previous grade I view with MAC 3, easy mask Dental  (+) Edentulous Upper, Edentulous Lower   Pulmonary neg pulmonary ROS   Pulmonary exam normal breath sounds clear to auscultation       Cardiovascular hypertension (amlodipine , metoprolol ), Pt. on medications and Pt. on home beta blockers (-) angina (-) Past MI, (-) Cardiac Stents and (-) CABG + dysrhythmias (palpitations) + Valvular Problems/Murmurs  Rhythm:Regular Rate:Normal  Echo 06/15/22: IMPRESSIONS  1. There is systolic anterior motion of the anterior mitral valve leaflet  with dynamic LVOT gradient in the setting of moderate symmetrical LVH and  hyperdynamic LV function. Peak resting gradient mild at 24 mmHg. Limited  Valsalva imaging, not able to  assess gradient. . Left ventricular ejection fraction, by estimation, is  >75%. The left ventricle has hyperdynamic function. The left ventricle has  no regional wall motion abnormalities. There is moderate left ventricular  hypertrophy. Left ventricular  diastolic parameters are consistent with Grade I diastolic dysfunction  (impaired relaxation). The average left ventricular global longitudinal  strain is -25.3 %. The global longitudinal strain is normal.  2. Right ventricular systolic function is normal. The right ventricular  size is normal. Tricuspid regurgitation signal is inadequate for assessing  PA pressure.  3. The mitral valve is normal in structure. No evidence of mitral valve  regurgitation. No evidence of mitral stenosis.  4. The aortic valve is tricuspid. Aortic valve  regurgitation is not  visualized. No aortic stenosis is present.  5. The inferior vena cava is normal in size with greater than 50%  respiratory variability, suggesting right atrial pressure of 3 mmHg.     Neuro/Psych  Headaches, Seizures - (on Keppra  and phenobarbital  (took both this morning); last seizure yesterday), Poorly Controlled,  PSYCHIATRIC DISORDERS Anxiety Depression       GI/Hepatic negative GI ROS, Neg liver ROS,,,  Endo/Other  negative endocrine ROS    Renal/GU negative Renal ROS     Musculoskeletal Osteoporosis    Abdominal   Peds  Hematology negative hematology ROS (+) Lab Results      Component                Value               Date                      WBC                      5.7                 02/11/2023                HGB                      14.4                02/11/2023                HCT                      44.8  02/11/2023                MCV                      101.8 (H)           02/11/2023                PLT                      425 (H)             02/11/2023              Anesthesia Other Findings   Reproductive/Obstetrics                             Anesthesia Physical Anesthesia Plan  ASA: 3  Anesthesia Plan: General   Post-op Pain Management: Tylenol  PO (pre-op)*   Induction: Intravenous  PONV Risk Score and Plan: 3 and Ondansetron , Dexamethasone  and Treatment may vary due to age or medical condition  Airway Management Planned: Oral ETT  Additional Equipment:   Intra-op Plan:   Post-operative Plan: Extubation in OR  Informed Consent: I have reviewed the patients History and Physical, chart, labs and discussed the procedure including the risks, benefits and alternatives for the proposed anesthesia with the patient or authorized representative who has indicated his/her understanding and acceptance.     Dental advisory given  Plan Discussed with: CRNA and  Anesthesiologist  Anesthesia Plan Comments: (PAT note written 02/15/2023 by Allison Zelenak, PA-C. Cardiologist is Dr. Mallipeddi with [facility].   Risks of general anesthesia discussed including, but not limited to, sore throat, hoarse voice, chipped/damaged teeth, injury to vocal cords, nausea and vomiting, allergic reactions, lung infection, heart attack, stroke, and death. All questions answered.   )       Anesthesia Quick Evaluation

## 2023-02-15 NOTE — Progress Notes (Signed)
 Anesthesia Chart Review:  Case: [id number] Date/Time: 02/17/23 1215   Procedure: VAGAL NERVE STIMULATOR IMPLANT (Left) - 3C   Anesthesia type: General   Pre-op diagnosis: PARTIAL SYMPTOMATIC EPILEPSY WITH COMPLEX PARTIAL SEIZURES, INTRACTABLE, WITHOUT STATUS EPILEPTICUS   [facility]: [facility] / [facility]   Surgeons: Lanis Pupa, MD       DISCUSSION: Patient is a 63 year old female scheduled for the above procedure.  She was referred for VNS due to continued breakthrough seizures despite phenobarbital  200 mg Monday to Friday and 100 mg Saturday and Sunday and Keppra  1500 mg twice daily.  Dr. Gregg increased Keppra  to 2000 mg twice daily and phenobarbital  to 200 mg daily in October. No acute findings on brain MRI in 01/2023.    History includes never smoker, HTN, murmur (06/15/22 echo: SAM of anterior MV leaflet with dynamic LVOT gradient in setting of moderate symmetrical LVH and hyperdynamic LVF, peak resting gradient mild at 24 mmHg), anxiety, seizure disorder, left THA (01/27/22, for femoral neck fracture), compression fracture L4 (12/03/22 xray).  Last evaluated by cardiologist Dr. Mallipeddi on 08/31/22 for follow-up HTN, tachycardia, anxiety. Event monitor from 05/2022 showed NSR ranging between 59 and 151 bpm with average HR 80 bpm, patient symptoms correlated with NSR (87 to 139 bpm) and ventricular ectopy. <1% PAC and <1% PVC burden. Echocardiogram from 06/2022 showed SAM with a dynamic LVOT gradient in the setting of moderate symmetrical LVH and hyperdynamic LV function with peak resting gradient 24 mmHg, mild.  She was placed on metoprolol  tartrate 25 mg twice daily. Doing well at last visit without angina, palpitations, SOB, syncope, presyncope, leg swelling. One year follow-up recommended.   Anesthesia team to evaluate on the day of surgery.    VS: BP (!) 157/83   Pulse 73   Temp 36.8 C (Oral)   Resp 18   Ht 5' 2 (1.575 m)   Wt 54.9 kg   SpO2 100%   BMI 22.13 kg/m     PROVIDERS: Antonetta Rollene BRAVO, MD is PCP  Gregg Lek, MD is neurologist Stacia Beauvais, MD is cardiologist Ezzard Raring, PA-C is GI provider. Evaluated on 10/19/22 for elevated alkaline phosphatase. Abd MRI and additional labs ordered. She wrote, elevated alkaline phosphatase appears to be coming from liver origin based on elevated GGT and 77% liver fraction on isoenzymes. Labs for primary biliary cholangitis (destruction of bile ducts due to autoimmune disorder) was negative. MRI showed mild dilation of the CBD without stone/mass seen, and felt likely a benign finding post cholecystectomy. Following labs. She was going to refer to  cardiology for cardiomegaly on imaging, but patient already established with cardiologist.   LABS: Labs reviewed: Acceptable for surgery. HFT on 12/31/22 was normal other than alk phos 179 which is known to be elevated (previously 189-292 within the past year, followed by GI). (all labs ordered are listed, but only abnormal results are displayed)  Labs Reviewed  BASIC METABOLIC PANEL - Abnormal; Notable for the following components:      Result Value   Potassium 3.4 (*)    Chloride 97 (*)    Glucose, Bld 113 (*)    BUN <5 (*)    All other components within normal limits  CBC - Abnormal; Notable for the following components:   MCV 101.8 (*)    Platelets 425 (*)    All other components within normal limits     IMAGES: MRI Brain 01/06/23: IMPRESSION: T2/FLAIR hyperintense foci in the subcortical deep white matter  of the cerebral hemispheres.  This is nonspecific but most consistent with chronic microvascular ischemic change, more than typical for age. Structures of the medial temporal lobes appear normal. Normal enhancement pattern.  No acute findings.  Xray L-spine 12/03/22 ([facility]): IMPRESSION: 1. Age indeterminate superior endplate fracture of L4 with 10-20%  loss of height. This does, however, appear new from MRI examination  of  10/28/2022. Correlation for point tenderness is recommended.   MRI Abd 10/28/19: IMPRESSION: 1. Status post cholecystectomy. 2. Mild dilatation of the common bile duct measuring up to 0.8 cm in caliber, duct tapering smoothly to the ampulla. No biliary ductal filling defect on today's examination. Findings are most consistent with benign postoperative biliary ductal dilatation. 3. Cardiomegaly. - Aortic Atherosclerosis (ICD10-I70.0).   EKG: 05/20/22: Sinus Rhythm  -Combined atrial enlargement. -Negative precordial T-waves. BORDERLINE   CV: Echo 06/15/22: IMPRESSIONS   1. There is systolic anterior motion of the anterior mitral valve leaflet  with dynamic LVOT gradient in the setting of moderate symmetrical LVH and  hyperdynamic LV function. Peak resting gradient mild at 24 mmHg. Limited  Valsalva imaging, not able to  assess gradient. . Left ventricular ejection fraction, by estimation, is  >75%. The left ventricle has hyperdynamic function. The left ventricle has  no regional wall motion abnormalities. There is moderate left ventricular  hypertrophy. Left ventricular  diastolic parameters are consistent with Grade I diastolic dysfunction  (impaired relaxation). The average left ventricular global longitudinal  strain is -25.3 %. The global longitudinal strain is normal.   2. Right ventricular systolic function is normal. The right ventricular  size is normal. Tricuspid regurgitation signal is inadequate for assessing  PA pressure.   3. The mitral valve is normal in structure. No evidence of mitral valve  regurgitation. No evidence of mitral stenosis.   4. The aortic valve is tricuspid. Aortic valve regurgitation is not  visualized. No aortic stenosis is present.   5. The inferior vena cava is normal in size with greater than 50%  respiratory variability, suggesting right atrial pressure of 3 mmHg.    Zio Monitor 05/20/22 - 05/28/22:   Normal sinus rhythm predominantly  ranging between 59 to 151 bpm with average HR 80 bpm   1 asymptomatic SVT run lasting 1.9 sec   No atrial or ventricular arrythmias   No AV block or pauses   <1% PAC and <1% PVC burden   Patient triggered events correlated with NSR (87-139 bpm) and ventricular ectopy   Past Medical History:  Diagnosis Date   Abnormal facial hair 05/11/2011   Anxiety and depression    GAD (generalized anxiety disorder)    Headache    Heart murmur    ECHO 06/15/22: SAM of anterior MV leaflet with dynamic LVOT gradient in setting of moderate symmetrical LVH and hyperdynamic LVF, Peak resting gradient mild at 24 mmHg   Hypertension    Insomnia    Psychotic disorder (HCC)    Seizure disorder (HCC)    stress related seizures. 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[facility])     Past Surgical History:  Procedure Laterality Date   ABDOMINAL HYSTERECTOMY     BREAST BIOPSY Left 08/24/2022   US  LT BREAST BX W LOC DEV 1ST LESION IMG BX SPEC US  GUIDE 08/24/2022 [facility]-ULTRASOUND   CESAREAN SECTION     CHOLECYSTECTOMY     COLONOSCOPY WITH PROPOFOL  N/A 02/15/2019   Procedure: COLONOSCOPY WITH PROPOFOL ;  Surgeon: Shaaron Lamar HERO, MD;  Location: [facility];  Service: Endoscopy;  Laterality: N/A;  2:45pm   POLYPECTOMY  02/15/2019   Procedure: POLYPECTOMY;  Surgeon: Shaaron Lamar HERO, MD;  Location: [facility];  Service: Endoscopy;;   TOTAL HIP ARTHROPLASTY Left 01/27/2022   Procedure: TOTAL HIP ARTHROPLASTY ANTERIOR APPROACH;  Surgeon: Jerri Kay HERO, MD;  Location: [facility] OR;  Service: Orthopedics;  Laterality: Left;    MEDICATIONS:  acetaminophen  (TYLENOL ) 500 MG tablet   alendronate  (FOSAMAX ) 35 MG tablet   amLODipine  (NORVASC ) 2.5 MG tablet   BIOTIN 5000 PO   Calcium Carb-Cholecalciferol 1000-800 MG-UNIT TABS   Camphor-Eucalyptus-Menthol  (VICKS VAPORUB EX)   Chlorphen-Phenyleph-APAP (ALLERGY MULTI-SYMPTOM) 2-5-325 MG TABS   Cholecalciferol (VITAMIN D ) 50 MCG (2000 UT) CAPS   clonazePAM  (KLONOPIN )  1 MG tablet   clonazePAM  (KLONOPIN ) 1 MG tablet   ferrous sulfate 325 (65 FE) MG tablet   Humidifier MISC   levETIRAcetam  (KEPPRA ) 1000 MG tablet   MAGNESIUM  PO   metoprolol  tartrate (LOPRESSOR ) 25 MG tablet   mirtazapine  (REMERON ) 45 MG tablet   Multiple Vitamin (MULTIVITAMIN) tablet   PHENObarbital  (LUMINAL) 100 MG tablet   zinc gluconate 50 MG tablet   No current facility-administered medications for this encounter.    Isaiah Ruder, PA-C Surgical Short Stay/Anesthesiology [facility] Phone [phone] [facility] Phone [phone] 02/15/2023 1:58 PM

## 2023-02-17 ENCOUNTER — Encounter (HOSPITAL_COMMUNITY): Payer: Self-pay | Admitting: Neurosurgery

## 2023-02-17 ENCOUNTER — Ambulatory Visit (HOSPITAL_COMMUNITY): Payer: 59 | Admitting: Vascular Surgery

## 2023-02-17 ENCOUNTER — Other Ambulatory Visit: Payer: Self-pay

## 2023-02-17 ENCOUNTER — Encounter (HOSPITAL_COMMUNITY)
Admission: RE | Disposition: A | Discharge status: Home / Self Care | Payer: Self-pay | Source: Ambulatory Visit | Attending: Neurosurgery

## 2023-02-17 ENCOUNTER — Ambulatory Visit (HOSPITAL_BASED_OUTPATIENT_CLINIC_OR_DEPARTMENT_OTHER): Payer: 59 | Admitting: Anesthesiology

## 2023-02-17 ENCOUNTER — Ambulatory Visit (HOSPITAL_COMMUNITY)
Admission: RE | Admit: 2023-02-17 | Discharge: 2023-02-17 | Disposition: A | Discharge status: Home / Self Care | Payer: 59 | Source: Ambulatory Visit | Attending: Neurosurgery | Admitting: Neurosurgery

## 2023-02-17 DIAGNOSIS — G40209 Localization-related (focal) (partial) symptomatic epilepsy and epileptic syndromes with complex partial seizures, not intractable, without status epilepticus: Secondary | ICD-10-CM | POA: Diagnosis not present

## 2023-02-17 DIAGNOSIS — I10 Essential (primary) hypertension: Secondary | ICD-10-CM | POA: Insufficient documentation

## 2023-02-17 DIAGNOSIS — M810 Age-related osteoporosis without current pathological fracture: Secondary | ICD-10-CM | POA: Insufficient documentation

## 2023-02-17 DIAGNOSIS — Z79899 Other long term (current) drug therapy: Secondary | ICD-10-CM | POA: Insufficient documentation

## 2023-02-17 DIAGNOSIS — F419 Anxiety disorder, unspecified: Secondary | ICD-10-CM | POA: Insufficient documentation

## 2023-02-17 DIAGNOSIS — R7303 Prediabetes: Secondary | ICD-10-CM | POA: Diagnosis not present

## 2023-02-17 DIAGNOSIS — G40919 Epilepsy, unspecified, intractable, without status epilepticus: Secondary | ICD-10-CM | POA: Insufficient documentation

## 2023-02-17 HISTORY — PX: VAGUS NERVE STIMULATOR INSERTION: SHX348

## 2023-02-17 SURGERY — VAGAL NERVE STIMULATOR IMPLANT
Anesthesia: General | Laterality: Left

## 2023-02-17 MED ORDER — CHLORHEXIDINE GLUCONATE 0.12 % MT SOLN
15.0000 mL | Freq: Once | OROMUCOSAL | Status: AC
  Administered 2023-02-17: 15 mL via OROMUCOSAL
  Filled 2023-02-17: qty 15

## 2023-02-17 MED ORDER — OXYCODONE HCL 5 MG PO TABS
ORAL_TABLET | ORAL | Status: AC
  Filled 2023-02-17: qty 1

## 2023-02-17 MED ORDER — PROPOFOL 10 MG/ML IV BOLUS
INTRAVENOUS | Status: DC | PRN
  Administered 2023-02-17: 130 mg via INTRAVENOUS

## 2023-02-17 MED ORDER — LIDOCAINE 2% (20 MG/ML) 5 ML SYRINGE
INTRAMUSCULAR | Status: AC
  Filled 2023-02-17: qty 5

## 2023-02-17 MED ORDER — BUPIVACAINE HCL (PF) 0.5 % IJ SOLN
INTRAMUSCULAR | Status: AC
  Filled 2023-02-17: qty 30

## 2023-02-17 MED ORDER — LIDOCAINE-EPINEPHRINE 1 %-1:100000 IJ SOLN
INTRAMUSCULAR | Status: AC
  Filled 2023-02-17: qty 1

## 2023-02-17 MED ORDER — EPHEDRINE SULFATE-NACL 50-0.9 MG/10ML-% IV SOSY
PREFILLED_SYRINGE | INTRAVENOUS | Status: DC | PRN
  Administered 2023-02-17: 5 mg via INTRAVENOUS
  Administered 2023-02-17: 10 mg via INTRAVENOUS
  Administered 2023-02-17 (×2): 5 mg via INTRAVENOUS

## 2023-02-17 MED ORDER — THROMBIN 5000 UNITS EX SOLR
CUTANEOUS | Status: AC
  Filled 2023-02-17: qty 5000

## 2023-02-17 MED ORDER — ACETAMINOPHEN 500 MG PO TABS
1000.0000 mg | ORAL_TABLET | Freq: Once | ORAL | Status: DC
  Filled 2023-02-17: qty 2

## 2023-02-17 MED ORDER — CHLORHEXIDINE GLUCONATE CLOTH 2 % EX PADS: 6.0000 | MEDICATED_PAD | Freq: Once | CUTANEOUS | Status: DC

## 2023-02-17 MED ORDER — LACTATED RINGERS IV SOLN: INTRAVENOUS | Status: DC

## 2023-02-17 MED ORDER — HYDROMORPHONE HCL 1 MG/ML IJ SOLN
INTRAMUSCULAR | Status: AC
  Filled 2023-02-17: qty 0.5

## 2023-02-17 MED ORDER — PROPOFOL 10 MG/ML IV BOLUS
INTRAVENOUS | Status: AC
  Filled 2023-02-17: qty 20

## 2023-02-17 MED ORDER — PHENYLEPHRINE HCL-NACL 20-0.9 MG/250ML-% IV SOLN: INTRAVENOUS | Status: DC | PRN

## 2023-02-17 MED ORDER — CEFAZOLIN SODIUM-DEXTROSE 2-4 GM/100ML-% IV SOLN
2.0000 g | INTRAVENOUS | Status: AC
  Administered 2023-02-17: 2 g via INTRAVENOUS
  Filled 2023-02-17: qty 100

## 2023-02-17 MED ORDER — HYDROCODONE-ACETAMINOPHEN 5-325 MG PO TABS: 1.0000 | ORAL_TABLET | Freq: Four times a day (QID) | ORAL | 0 refills | Status: AC | PRN

## 2023-02-17 MED ORDER — FENTANYL CITRATE (PF) 100 MCG/2ML IJ SOLN
INTRAMUSCULAR | Status: AC
  Filled 2023-02-17: qty 2

## 2023-02-17 MED ORDER — FENTANYL CITRATE (PF) 250 MCG/5ML IJ SOLN
INTRAMUSCULAR | Status: AC
  Filled 2023-02-17: qty 5

## 2023-02-17 MED ORDER — AMISULPRIDE (ANTIEMETIC) 5 MG/2ML IV SOLN: 10.0000 mg | Freq: Once | INTRAVENOUS | Status: DC | PRN

## 2023-02-17 MED ORDER — BUPIVACAINE HCL 0.5 % IJ SOLN
INTRAMUSCULAR | Status: DC | PRN
  Administered 2023-02-17: 9 mL

## 2023-02-17 MED ORDER — ONDANSETRON HCL 4 MG/2ML IJ SOLN
INTRAMUSCULAR | Status: AC
  Filled 2023-02-17: qty 2

## 2023-02-17 MED ORDER — THROMBIN 5000 UNITS EX SOLR
OROMUCOSAL | Status: DC | PRN
  Administered 2023-02-17: 5 mL via TOPICAL

## 2023-02-17 MED ORDER — ORAL CARE MOUTH RINSE: 15.0000 mL | Freq: Once | OROMUCOSAL | Status: AC

## 2023-02-17 MED ORDER — FENTANYL CITRATE (PF) 250 MCG/5ML IJ SOLN
INTRAMUSCULAR | Status: DC | PRN
  Administered 2023-02-17: 25 ug via INTRAVENOUS
  Administered 2023-02-17: 75 ug via INTRAVENOUS
  Administered 2023-02-17: 100 ug via INTRAVENOUS
  Administered 2023-02-17 (×2): 25 ug via INTRAVENOUS

## 2023-02-17 MED ORDER — SUGAMMADEX SODIUM 200 MG/2ML IV SOLN
INTRAVENOUS | Status: DC | PRN
  Administered 2023-02-17: 110 mg via INTRAVENOUS

## 2023-02-17 MED ORDER — ROCURONIUM BROMIDE 10 MG/ML (PF) SYRINGE
PREFILLED_SYRINGE | INTRAVENOUS | Status: DC | PRN
  Administered 2023-02-17 (×2): 40 mg via INTRAVENOUS

## 2023-02-17 MED ORDER — MIDAZOLAM HCL 2 MG/2ML IJ SOLN
INTRAMUSCULAR | Status: AC
  Filled 2023-02-17: qty 2

## 2023-02-17 MED ORDER — MIDAZOLAM HCL 2 MG/2ML IJ SOLN
INTRAMUSCULAR | Status: DC | PRN
  Administered 2023-02-17: 2 mg via INTRAVENOUS

## 2023-02-17 MED ORDER — OXYCODONE HCL 5 MG/5ML PO SOLN
5.0000 mg | Freq: Once | ORAL | Status: AC | PRN
Start: 2023-02-17 — End: 2023-02-17

## 2023-02-17 MED ORDER — ONDANSETRON HCL 4 MG/2ML IJ SOLN
INTRAMUSCULAR | Status: DC | PRN
  Administered 2023-02-17: 4 mg via INTRAVENOUS

## 2023-02-17 MED ORDER — 0.9 % SODIUM CHLORIDE (POUR BTL) OPTIME
TOPICAL | Status: DC | PRN
  Administered 2023-02-17: 1000 mL

## 2023-02-17 MED ORDER — PHENYLEPHRINE 80 MCG/ML (10ML) SYRINGE FOR IV PUSH (FOR BLOOD PRESSURE SUPPORT)
PREFILLED_SYRINGE | INTRAVENOUS | Status: DC | PRN
  Administered 2023-02-17 (×2): 120 ug via INTRAVENOUS
  Administered 2023-02-17: 80 ug via INTRAVENOUS

## 2023-02-17 MED ORDER — LIDOCAINE 2% (20 MG/ML) 5 ML SYRINGE
INTRAMUSCULAR | Status: DC | PRN
  Administered 2023-02-17: 60 mg via INTRAVENOUS

## 2023-02-17 MED ORDER — ROCURONIUM BROMIDE 10 MG/ML (PF) SYRINGE
PREFILLED_SYRINGE | INTRAVENOUS | Status: AC
  Filled 2023-02-17: qty 10

## 2023-02-17 MED ORDER — FENTANYL CITRATE (PF) 100 MCG/2ML IJ SOLN
25.0000 ug | INTRAMUSCULAR | Status: DC | PRN
  Administered 2023-02-17: 50 ug via INTRAVENOUS

## 2023-02-17 MED ORDER — LIDOCAINE-EPINEPHRINE 1 %-1:100000 IJ SOLN
INTRAMUSCULAR | Status: DC | PRN
  Administered 2023-02-17: 9 mL

## 2023-02-17 MED ORDER — DEXAMETHASONE SODIUM PHOSPHATE 10 MG/ML IJ SOLN
INTRAMUSCULAR | Status: AC
  Filled 2023-02-17: qty 1

## 2023-02-17 MED ORDER — OXYCODONE HCL 5 MG PO TABS
5.0000 mg | ORAL_TABLET | Freq: Once | ORAL | Status: AC | PRN
  Administered 2023-02-17: 5 mg via ORAL

## 2023-02-17 MED ORDER — DEXAMETHASONE SODIUM PHOSPHATE 10 MG/ML IJ SOLN
INTRAMUSCULAR | Status: DC | PRN
  Administered 2023-02-17: 5 mg via INTRAVENOUS

## 2023-02-17 MED ORDER — HYDROMORPHONE HCL 1 MG/ML IJ SOLN
INTRAMUSCULAR | Status: DC | PRN
Start: 2023-02-17 — End: 2023-02-17
  Administered 2023-02-17: .5 mg via INTRAVENOUS

## 2023-02-17 SURGICAL SUPPLY — 50 items
BAG COUNTER SPONGE SURGICOUNT (BAG) ×1 IMPLANT
BENZOIN TINCTURE PRP APPL 2/3 (GAUZE/BANDAGES/DRESSINGS) IMPLANT
BLADE CLIPPER SURG (BLADE) IMPLANT
BLADE SURG 11 STRL SS (BLADE) ×1 IMPLANT
CANISTER SUCT 3000ML PPV (MISCELLANEOUS) ×1 IMPLANT
DERMABOND ADVANCED .7 DNX12 (GAUZE/BANDAGES/DRESSINGS) ×1 IMPLANT
DEVICE PATIENT MAGNET VNS CTRL (MISCELLANEOUS) IMPLANT
DRAPE CAMERA VIDEO/LASER (DRAPES) ×1 IMPLANT
DRAPE HALF SHEET 40X57 (DRAPES) IMPLANT
DRAPE LAPAROTOMY 100X72 PEDS (DRAPES) ×1 IMPLANT
DRAPE MICROSCOPE SLANT 54X150 (MISCELLANEOUS) ×1 IMPLANT
DRSG OPSITE POSTOP 3X4 (GAUZE/BANDAGES/DRESSINGS) IMPLANT
DRSG TEGADERM 4X4.75 (GAUZE/BANDAGES/DRESSINGS) ×4 IMPLANT
DURAPREP 6ML APPLICATOR 50/CS (WOUND CARE) ×1 IMPLANT
ELECT COATED BLADE 2.86 ST (ELECTRODE) ×1 IMPLANT
ELECT REM PT RETURN 9FT ADLT (ELECTROSURGICAL) ×1 IMPLANT
ELECTRODE REM PT RTRN 9FT ADLT (ELECTROSURGICAL) ×1 IMPLANT
GAUZE 4X4 16PLY ~~LOC~~+RFID DBL (SPONGE) IMPLANT
GENERATOR M1000 SENTIVA (Generator) IMPLANT
GLOVE BIOGEL PI IND STRL 7.5 (GLOVE) ×1 IMPLANT
GLOVE ECLIPSE 7.0 STRL STRAW (GLOVE) ×1 IMPLANT
GOWN STRL REUS W/ TWL LRG LVL3 (GOWN DISPOSABLE) ×2 IMPLANT
GOWN STRL REUS W/ TWL XL LVL3 (GOWN DISPOSABLE) IMPLANT
GOWN STRL REUS W/TWL 2XL LVL3 (GOWN DISPOSABLE) IMPLANT
HEMOSTAT POWDER KIT SURGIFOAM (HEMOSTASIS) ×1 IMPLANT
KIT BASIN OR (CUSTOM PROCEDURE TRAY) ×1 IMPLANT
KIT TURNOVER KIT B (KITS) ×1 IMPLANT
LEAD PERENNIAFLEX 2-3 304 (Neuro Prosthesis/Implant) IMPLANT
LOOP VASCLR MAXI BLUE 18IN ST (MISCELLANEOUS) IMPLANT
LOOPS VASCLR MAXI BLUE 18IN ST (MISCELLANEOUS) IMPLANT
NDL HYPO 22X1.5 SAFETY MO (MISCELLANEOUS) ×1 IMPLANT
NEEDLE HYPO 22X1.5 SAFETY MO (MISCELLANEOUS) ×1 IMPLANT
NS IRRIG 1000ML POUR BTL (IV SOLUTION) ×1 IMPLANT
PACK LAMINECTOMY NEURO (CUSTOM PROCEDURE TRAY) ×1 IMPLANT
PAD ARMBOARD 7.5X6 YLW CONV (MISCELLANEOUS) ×3 IMPLANT
SPIKE FLUID TRANSFER (MISCELLANEOUS) ×1 IMPLANT
SPONGE INTESTINAL PEANUT (DISPOSABLE) IMPLANT
SPONGE SURGIFOAM ABS GEL SZ50 (HEMOSTASIS) IMPLANT
SUT ETHILON 3 0 FSL (SUTURE) IMPLANT
SUT NURALON 4 0 TR CR/8 (SUTURE) ×1 IMPLANT
SUT VIC AB 0 CT1 27XBRD ANBCTR (SUTURE) ×1 IMPLANT
SUT VIC AB 3-0 SH 8-18 (SUTURE) ×1 IMPLANT
SUT VICRYL 3-0 RB1 18 ABS (SUTURE) ×2 IMPLANT
TIE VASCULAR MAXI BLUE 18IN ST (MISCELLANEOUS) IMPLANT
TOWEL GREEN STERILE (TOWEL DISPOSABLE) ×1 IMPLANT
TOWEL GREEN STERILE FF (TOWEL DISPOSABLE) ×1 IMPLANT
TUNNELING TOOL (MISCELLANEOUS) IMPLANT
VASCULAR TIE MAXI BLUE 18IN ST (MISCELLANEOUS)
VASCULAR TIE MINI RED 18IN STL (MISCELLANEOUS) IMPLANT
WATER STERILE IRR 1000ML POUR (IV SOLUTION) ×1 IMPLANT

## 2023-02-17 NOTE — Discharge Summary (Signed)
Physician Discharge Summary  Patient ID: Sandra Riley MRN: [id number] DOB/AGE: 65-Apr-1962 65 y.o.  Admit date: 02/17/2023 Discharge date: 02/17/2023  Admission Diagnoses:  Medically intractable epilepsy  Discharge Diagnoses:  Same Active Problems:   * No active hospital problems. *   Discharged Condition: Stable  Hospital Course:  Sandra Riley is a 63 y.o. female who underwent uncomplicated placement of a left VNS. She was at baseline postop and discharged home from PACU in stable condition.  Treatments: Surgery - Placement of left VNS  Discharge Exam: Blood pressure (!) 152/71, pulse 95, temperature 99.2 F (37.3 C), resp. rate 12, height 5\' 2"  (1.575 m), weight 55.3 kg, SpO2 95%. Awake, alert, oriented Speech fluent, appropriate CN grossly intact 5/5 BUE/BLE Wound c/d/i  Disposition: Discharge disposition: 01-Home or Self Care       Discharge Instructions     Call MD for:  redness, tenderness, or signs of infection (pain, swelling, redness, odor or green/yellow discharge around incision site)   Complete by: As directed    Call MD for:  temperature >100.4   Complete by: As directed    Diet - low sodium heart healthy   Complete by: As directed    Discharge instructions   Complete by: As directed    Walk at home as much as possible, at least 4 times / day   Increase activity slowly   Complete by: As directed    Lifting restrictions   Complete by: As directed    No lifting > 10 lbs   May shower / Bathe   Complete by: As directed    48 hours after surgery   May walk up steps   Complete by: As directed    Other Restrictions   Complete by: As directed    No bending/twisting at waist   Remove dressing in 48 hours   Complete by: As directed       Allergies as of 02/17/2023       Reactions   Aspirin    Allergic to non coated ASA Caused gastric ulcers   Nsaids Other (See Comments)   Directed by MD to not take.    Singulair [montelukast  Sodium] Other (See Comments)   Became sweaty and nuaseated        Medication List     TAKE these medications    acetaminophen 500 MG tablet Commonly known as: TYLENOL Take 1,000 mg by mouth every 6 (six) hours as needed for moderate pain.   alendronate 35 MG tablet Commonly known as: FOSAMAX TAKE ONE TABLET BY MOUTH ONCE A WEEK IN THE MORNING WITH A FULL GLASS OF WATER ON AN EMPTY STOMACH. DO NOT LAY DOWN FOR 30 MINUTES. What changed:  how much to take how to take this when to take this   Allergy Multi-Symptom 2-5-325 MG Tabs Generic drug: Chlorphen-Phenyleph-APAP Take 2 tablets by mouth 2 (two) times daily.   amLODipine 2.5 MG tablet Commonly known as: NORVASC TAKE 1 TABLET BY MOUTH DAILY   BIOTIN 5000 PO Take 5,000 mcg by mouth in the morning.   Calcium Carb-Cholecalciferol 1000-800 MG-UNIT Tabs Take 2 tablets by mouth in the morning.   clonazePAM 1 MG tablet Commonly known as: KLONOPIN Take 1 tablet (1 mg total) by mouth 2 (two) times daily.   clonazePAM 1 MG tablet Commonly known as: KLONOPIN Take 1 tablet (1 mg total) by mouth 2 (two) times daily.   ferrous sulfate 325 (65 FE) MG tablet Take 325 mg by  mouth in the morning.   Humidifier Misc Large humidifier.   HYDROcodone-acetaminophen 5-325 MG tablet Commonly known as: NORCO/VICODIN Take 1 tablet by mouth every 6 (six) hours as needed for up to 3 days.   levETIRAcetam 1000 MG tablet Commonly known as: KEPPRA Take 2 tablets (2,000 mg total) by mouth 2 (two) times daily.   MAGNESIUM PO Take 500 mg by mouth in the morning.   metoprolol tartrate 25 MG tablet Commonly known as: LOPRESSOR TAKE 1 TABLET BY MOUTH TWICE DAILY   mirtazapine 45 MG tablet Commonly known as: REMERON TAKE 1 TABLET BY MOUTH AT BEDTIME   multivitamin tablet Take 1 tablet by mouth in the morning.   PHENObarbital 100 MG tablet Commonly known as: LUMINAL Take 2 tablets (200 mg total) by mouth at bedtime.   VICKS VAPORUB  EX Apply 1 Application topically as needed (congestion.).   Vitamin D 50 MCG (2000 UT) Caps Take 1 capsule by mouth in the morning.   zinc gluconate 50 MG tablet Take 50 mg by mouth in the morning.        Follow-up Information     Lisbeth Renshaw, MD Follow up in 3 week(s).   Specialty: Neurosurgery Contact information: [address] [place] [address] [phone]                 Signed: Jackelyn Hoehn 02/17/2023, 3:50 PM

## 2023-02-17 NOTE — H&P (Signed)
Chief Complaint   Seizures  History of Present Illness  Sandra Riley is a 63 year old woman I am seeing for initial consultation at the request of Dr. Teresa Coombs. She is referred for evaluation of medically intractable epilepsy and possible implantation of a vagus nerve stimulator. Briefly, the patient reports onset of seizures when she was age 36 around the time her mother passed. She has been dealing with epilepsy since that time. She has been trialed on multiple different antiepileptic medications. She is currently maintained on Keppra, phenobarbital, and clobazam. Despite this, patient reports having seizures primarily while she is sleeping at least 1-2 times per month. She also reports what sound like complex partial seizures during the day.   Of note, the patient reports a history of medically controlled hypertension. She is prediabetic. There is no history of heart attack or stroke. No known lung, liver, kidney disease. No cancer history. She is not on any blood thinners or antiplatelet agents. She is a nonsmoker. She has not had any previous neck surgeries or radiation treatments.   Past Medical History   Past Medical History:  Diagnosis Date   Abnormal facial hair 05/11/2011   Anxiety and depression    GAD (generalized anxiety disorder)    Headache    Heart murmur    ECHO 06/15/22: SAM of anterior MV leaflet with dynamic LVOT gradient in setting of moderate symmetrical LVH and hyperdynamic LVF, Peak resting gradient mild at 24 mmHg   Hypertension    Insomnia    Psychotic disorder (HCC)    Seizure disorder (HCC)    stress related seizures. 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[facility])     Past Surgical History   Past Surgical History:  Procedure Laterality Date   ABDOMINAL HYSTERECTOMY     BREAST BIOPSY Left 08/24/2022   [place] LT BREAST BX W LOC DEV 1ST LESION IMG BX SPEC US GUIDE 08/24/2022 [facility]-ULTRASOUND   CESAREAN SECTION     CHOLECYSTECTOMY     COLONOSCOPY  WITH PROPOFOL N/A 02/15/2019   Procedure: COLONOSCOPY WITH PROPOFOL;  Surgeon: Corbin Ade, MD;  Location[facility];  Service: Endoscopy;  Laterality: N/A;  2:45pm   POLYPECTOMY  02/15/2019   Procedure: POLYPECTOMY;  Surgeon: Corbin Ade, MD;  Location: [facility];  Service: Endoscopy;;   TOTAL HIP ARTHROPLASTY Left 01/27/2022   Procedure: TOTAL HIP ARTHROPLASTY ANTERIOR APPROACH;  Surgeon: Tarry Kos, MD;  Location: [facility];  Service: Orthopedics;  Laterality: Left;    Social History   Social History   Tobacco Use   Smoking status: Never   Smokeless tobacco: Never  Vaping Use   Vaping status: Never Used  Substance Use Topics   Alcohol use: No   Drug use: No    Medications   Prior to Admission medications   Medication Sig Start Date End Date Taking? Authorizing Provider  acetaminophen (TYLENOL) 500 MG tablet Take 1,000 mg by mouth every 6 (six) hours as needed for moderate pain.   Yes [provider]  alendronate (FOSAMAX) 35 MG tablet TAKE ONE TABLET BY MOUTH ONCE A WEEK IN THE MORNING WITH A FULL GLASS OF WATER ON AN EMPTY STOMACH. DO NOT LAY DOWN FOR 30 MINUTES. Patient taking differently: Take 35 mg by mouth every Wednesday. TAKE ONE TABLET BY MOUTH ONCE A WEEK IN THE MORNING WITH A FULL GLASS OF WATER ON AN EMPTY STOMACH. DO NOT LAY DOWN FOR 30 MINUTES. 07/06/22  Yes Syliva Overman  E, MD  amLODipine (NORVASC) 2.5 MG tablet TAKE 1 TABLET BY MOUTH DAILY 01/31/23  Yes Mallipeddi, Vishnu P, MD  BIOTIN 5000 PO Take 5,000 mcg by mouth in the morning.   Yes [provider]  Calcium Carb-Cholecalciferol 1000-800 MG-UNIT TABS Take 2 tablets by mouth in the morning.   Yes [provider]  Camphor-Eucalyptus-Menthol (VICKS VAPORUB EX) Apply 1 Application topically as needed (congestion.).   Yes [provider]  Chlorphen-Phenyleph-APAP (ALLERGY MULTI-SYMPTOM) 2-5-325 MG TABS Take 2 tablets by mouth 2 (two) times daily.   Yes [provider]  Cholecalciferol (VITAMIN D) 50 MCG (2000 UT) CAPS Take 1 capsule by mouth in the morning.   Yes [provider]  clonazePAM (KLONOPIN) 1 MG tablet Take 1 tablet (1 mg total) by mouth 2 (two) times daily. 08/23/22  Yes Kerri Perches, MD  ferrous sulfate 325 (65 FE) MG tablet Take 325 mg by mouth in the morning.   Yes [provider]  levETIRAcetam (KEPPRA) 1000 MG tablet Take 2 tablets (2,000 mg total) by mouth 2 (two) times daily. 11/18/22 11/13/23 Yes Camara, Amalia Hailey, MD  MAGNESIUM PO Take 500 mg by mouth in the morning.   Yes [provider]  metoprolol tartrate (LOPRESSOR) 25 MG tablet TAKE 1 TABLET BY MOUTH TWICE DAILY 01/31/23  Yes Mallipeddi, Vishnu P, MD  mirtazapine (REMERON) 45 MG tablet TAKE 1 TABLET BY MOUTH AT BEDTIME 12/07/22  Yes Kerri Perches, MD  Multiple Vitamin (MULTIVITAMIN) tablet Take 1 tablet by mouth in the morning.   Yes [provider]  PHENObarbital (LUMINAL) 100 MG tablet Take 2 tablets (200 mg total) by mouth at bedtime. 11/23/22 11/18/23 Yes Windell Norfolk, MD  zinc gluconate 50 MG tablet Take 50 mg by mouth in the morning.   Yes [provider]  clonazePAM (KLONOPIN) 1 MG tablet Take 1 tablet (1 mg total) by mouth 2 (two) times daily. 02/14/23   Kerri Perches, MD  Humidifier MISC Large humidifier. 01/13/22   Kerri Perches, MD    Allergies   Allergies  Allergen Reactions   Aspirin     Allergic to non coated ASA Caused gastric ulcers    Nsaids Other (See Comments)    Directed by MD to not take.    Singulair [Montelukast Sodium] Other (See Comments)    Became sweaty and nuaseated    Review of Systems  ROS  Neurologic Exam  Awake, alert, oriented Memory and concentration grossly intact Speech fluent, appropriate CN grossly intact Motor exam: Upper Extremities Deltoid Bicep Tricep Grip  Right 5/5 5/5 5/5 5/5  Left 5/5 5/5 5/5 5/5   Lower Extremities IP Quad PF DF EHL   Right 5/5 5/5 5/5 5/5 5/5  Left 5/5 5/5 5/5 5/5 5/5   Sensation grossly intact to LT  Impression  - 64 y.o. female with medically intractable epilepsy, appears to be a good candidate for VNS  Plan  - Will proceed with placement of left VNS  I have reviewed the indications for the procedure as well as the details of the procedure and the expected postoperative course and recovery at length with the patient in the office. We have also reviewed in detail the risks, benefits, and alternatives to the procedure. All questions were answered and Sandra Riley provided informed consent to proceed.  Sandra Renshaw, MD [facility]

## 2023-02-17 NOTE — Op Note (Signed)
  NEUROSURGERY OPERATIVE NOTE   PREOP DIAGNOSIS: Medically intractable epilepsy   POSTOP DIAGNOSIS: Same  PROCEDURE: 1. Placement of left Vagal nerve stimulator  SURGEON: Dr. Lisbeth Renshaw, MD  ASSISTANT: Dr. Hoyt Koch, MD  ANESTHESIA: General Endotracheal  EBL: Minimal  SPECIMENS: None  DRAINS: None  COMPLICATIONS: None immediate  CONDITION: Hemodynamically stable to PACU  HISTORY: Sandra Riley is a 63 y.o.  female who was initially seen in the outpatient clinic as a referral from neurology for medically intractable epilepsy. She continues to have seizures despite AEDs. The patient appeared to be a good candidate for the procedure. The risks and benefits of the surgery were reviewed in detail. After all questions were answered, informed consent was obtained.  PROCEDURE IN DETAIL: After informed consent was obtained and witnessed, the patient was brought to the operating room. After induction of general anesthesia, the patient was positioned on the operative table in the supine position. All pressure points were meticulously padded. Skin incisions were then marked out and prepped and draped in the usual sterile fashion.  After timeout was conducted, left transverse neck skin incision was infiltrated with local anesthetic with epinephrine. Skin incision was then made sharply, and Bovie electrocautery was used to dissect the subcutaneous tissue. The platysma muscle was identified and incised. The platysma was then undermined. The medial border of the sternocleidomastoid muscle was identified, and dissection was carried out utilizing natural tissue planes of the neck until the carotid sheath was identified. The jugular vein was initially identified and the carotid sheath was incised. The vagus nerve was then identified running between the carotid and jugular. The vagus nerve was circumferentially dissected for length of a proximally 2-1/2 cm. The infraclavicular incision  was then infiltrated with local and opened sharply, and Bovie electrocautery was used to dissect the subcutaneous tissue. A subcutaneous pocket was then made. A subcutaneous tunneler was then passed from the neck to the infraclavicular incision. The vagal stimulator leads were then tunneled. The leads were then placed on the vagus nerve. A relaxing curl was then placed, and the leaves were tacked to the sternocleidomastoid muscle. The generator was then connected to the leads, and placed in the pocket.  Testing was done to confirm normal impedance, and good placement and heart rate detection.   Wounds were then irrigated with copious amounts of normal saline irrigation. The platysma was closed using interrupted 3-0 Vicryl stitches. Subcutaneous layer and the subclavicular incision was closed using interrupted 3-0 Vicryl stitches. Skin was then closed using interrupted 3-0 Vicryl, and a layer of Dermabond was applied.   At the end of the case all sponge, needle, cottonoid, and instrument counts were correct. The patient was then extubated, transferred to the stretcher, and taken to the postanesthesia care unit in stable hemodynamic condition.   Lisbeth Renshaw, MD [facility]

## 2023-02-17 NOTE — Transfer of Care (Signed)
Immediate Anesthesia Transfer of Care Note  Patient: Sandra Riley  Procedure(s) Performed: VAGAL NERVE STIMULATOR IMPLANT (Left)  Patient Location: PACU  Anesthesia Type:General  Level of Consciousness: awake, alert , and oriented  Airway & Oxygen Therapy: Patient Spontanous Breathing and Patient connected to nasal cannula oxygen  Post-op Assessment: Report given to RN and Post -op Vital signs reviewed and stable  Post vital signs: Reviewed and stable  Last Vitals:  Vitals Value Taken Time  BP    Temp    Pulse    Resp    SpO2      Last Pain:  Vitals:   02/17/23 1035  TempSrc:   PainSc: 0-No pain         Complications: No notable events documented.

## 2023-02-17 NOTE — Anesthesia Procedure Notes (Signed)
Procedure Name: Intubation Date/Time: 02/17/2023 1:11 PM  Performed by: Allyn Kenner, CRNAPre-anesthesia Checklist: Patient identified, Emergency Drugs available, Suction available and Patient being monitored Patient Re-evaluated:Patient Re-evaluated prior to induction Oxygen Delivery Method: Circle System Utilized Preoxygenation: Pre-oxygenation with 100% oxygen Induction Type: IV induction Ventilation: Mask ventilation without difficulty Laryngoscope Size: Miller and 2 Grade View: Grade I Tube type: Oral Tube size: 7.0 mm Number of attempts: 1 Airway Equipment and Method: Stylet and Oral airway Placement Confirmation: ETT inserted through vocal cords under direct vision, positive ETCO2 and breath sounds checked- equal and bilateral Secured at: 22 cm Tube secured with: Tape Dental Injury: Teeth and Oropharynx as per pre-operative assessment

## 2023-02-18 ENCOUNTER — Encounter (HOSPITAL_COMMUNITY): Payer: Self-pay | Admitting: Neurosurgery

## 2023-02-18 NOTE — Anesthesia Postprocedure Evaluation (Signed)
Anesthesia Post Note  Patient: Danine Boughner  Procedure(s) Performed: VAGAL NERVE STIMULATOR IMPLANT (Left)     Patient location during evaluation: PACU Anesthesia Type: General Level of consciousness: awake Pain management: pain level controlled Vital Signs Assessment: post-procedure vital signs reviewed and stable Respiratory status: spontaneous breathing, nonlabored ventilation and respiratory function stable Cardiovascular status: blood pressure returned to baseline and stable Postop Assessment: no apparent nausea or vomiting Anesthetic complications: no   No notable events documented.  Last Vitals:  Vitals:   02/17/23 1600 02/17/23 1608  BP: (!) 152/76 (!) 157/72  Pulse: 96 94  Resp: 12 17  Temp:  36.6 C  SpO2: 94% 95%    Last Pain:  Vitals:   02/17/23 1608  TempSrc:   PainSc: 5                  Linton Rump

## 2023-03-08 ENCOUNTER — Encounter: Payer: Self-pay | Admitting: Neurology

## 2023-03-08 ENCOUNTER — Ambulatory Visit: Payer: 59 | Admitting: Neurology

## 2023-03-08 VITALS — BP 158/81 | HR 64 | Ht 62.0 in | Wt 121.9 lb

## 2023-03-08 DIAGNOSIS — Z9689 Presence of other specified functional implants: Secondary | ICD-10-CM

## 2023-03-08 DIAGNOSIS — G40219 Localization-related (focal) (partial) symptomatic epilepsy and epileptic syndromes with complex partial seizures, intractable, without status epilepticus: Secondary | ICD-10-CM

## 2023-03-09 ENCOUNTER — Encounter: Payer: Self-pay | Admitting: Neurology

## 2023-03-09 NOTE — Patient Instructions (Addendum)
 Continue current medications  VNS turn on and setting changed. Patient tolerated procedure well  Return in 6 weeks for additional setting changes.  Please contact us  for any concerns or any breakthrough seizure.

## 2023-03-09 NOTE — Progress Notes (Signed)
 [facility]  PATIENT: Sandra Riley DOB: 1960/12/25  REQUESTING CLINICIAN: Antonetta Rollene BRAVO, MD HISTORY FROM: Patient, husband and chart review  REASON FOR VISIT: Seizure disorder, here to establish care    HISTORICAL  CHIEF COMPLAINT:  Chief Complaint  Patient presents with   VNS REPROGRAMMING    Rm13, husband present, VNS REPROGRAMMING: pt reported last sz episode was one day last week according to pt and she stated that it occurred due to missing medication. Pt reported repetitive bending causes her to have ha's since implantation of vns     INTERVAL HISTORY 03/08/2023:  Patient presents today for follow up. She is accompanied by her husband. Last visit was in [place], at that time we have increase her Phenobarbital  to 200 mg nightly and Keppra  to 2000 mg twice daily. She continued to have breakthrough seizures. She had her VNS implanted on January 16, reports one seizure since implantation of VNS, possibly due to missed medication.   INTERVAL HISTORY 11/18/2022 Patient presents today for follow-up, last visit was in January, since then, she tells me that she had her hip surgery but continues to have breakthrough seizures.  She reports at least 2 seizures during her sleep.  Husband is not aware of the seizures but patient tells me that when she wakes up in the morning, she feels tired, feels drowsy, weak and has a bad taste in her mouth and that is how she knows she had a seizure.   Husband tells me about once a month she will have episode of behavioral arrest, unresponsiveness, looking like she is in a trance.  She is on phenobarbital  200 mg nightly Monday to Friday and 100 mg nightly Saturday and Sunday and Keppra  1500 mg twice daily but still having breakthrough seizure. Her routine EEG showed left temporal epileptiform discharges.     HISTORY OF PRESENT ILLNESS:  This is a 63 year old woman past medical history of seizure disorder, anxiety, depression,  osteoporosis, recent fall with hip fracture who is presenting to establish care.  Patient reports a long history of seizure diagnosed at the age of 34.  Initially she describes her seizures as generalized convulsion with tongue biting.  He was on phenobarbital  and Dilantin .  She reported her seizure was uncontrolled until she initially follow-up with Dr. Milton who added levetiracetam .  Since being on levetiracetam  and phenobarbital  she has not had any generalized convulsion and this is more than 20 years ago.  Now she is reporting seizure that she described as disorientation and losing track of time and confusion.  She still having those and they have been worse in the past year since her son died\.  Currently she is on phenobarbital  97.2 mg 2 tabs nightly and Keppra  1500 mg twice daily.  While in rehab, she was told that her phenobarbital  level was elevated therefore it was decreased to 1 tablet on Saturday and Sunday.  She denies any symptoms  associated with phenobarb toxicity and she reports that her last seizure that she described as disorientation and losing track of time was in December 2023 since then she has not had any additional seizures.  She is tolerating the medications very well and denies any side effect. She denies any seizure risk factors, denies any family history of seizures denies any injury from seizures. Her fall and hip fracture was not related to a seizure.   Handedness: Right handed   Onset: At the age of 1  Seizure Type: Disorientation, losing track of time,  last convulsion more than 20 year ago   Current frequency: Last seiure was in Dec 2023  Any injuries from seizures: Tongue biting   Seizure risk factors: Denies   Previous ASMs: Phenobarbital , Levetiracetam , Dilantin ,   Currenty ASMs: Phenobarbital  200 mg nightly, and Levetiracetam  2000 mg twice daily  ASMs side effects: None   Brain Images: None available for review   Previous EEGs: EEG 2013: Normal     OTHER MEDICAL CONDITIONS: Seizure disorder, recent fall and hip fracture, Hypertension, osteoporosis, anxiety/depression   REVIEW OF SYSTEMS: Full 14 system review of systems performed and negative with exception of: As noted in the HPI   ALLERGIES: Allergies  Allergen Reactions   Aspirin     Allergic to non coated ASA Caused gastric ulcers    Nsaids Other (See Comments)    Directed by MD to not take.    Singulair  [Montelukast  Sodium] Other (See Comments)    Became sweaty and nuaseated    HOME MEDICATIONS: Outpatient Medications Prior to Visit  Medication Sig Dispense Refill   acetaminophen  (TYLENOL ) 500 MG tablet Take 1,000 mg by mouth every 6 (six) hours as needed for moderate pain.     alendronate  (FOSAMAX ) 35 MG tablet TAKE ONE TABLET BY MOUTH ONCE A WEEK IN THE MORNING WITH A FULL GLASS OF WATER  ON AN EMPTY STOMACH. DO NOT LAY DOWN FOR 30 MINUTES. (Patient taking differently: Take 35 mg by mouth every Wednesday. TAKE ONE TABLET BY MOUTH ONCE A WEEK IN THE MORNING WITH A FULL GLASS OF WATER  ON AN EMPTY STOMACH. DO NOT LAY DOWN FOR 30 MINUTES.) 5 tablet 5   amLODipine  (NORVASC ) 2.5 MG tablet TAKE 1 TABLET BY MOUTH DAILY 90 tablet 1   BIOTIN 5000 PO Take 5,000 mcg by mouth in the morning.     Calcium Carb-Cholecalciferol 1000-800 MG-UNIT TABS Take 2 tablets by mouth in the morning.     Camphor-Eucalyptus-Menthol  (VICKS VAPORUB EX) Apply 1 Application topically as needed (congestion.).     Chlorphen-Phenyleph-APAP (ALLERGY MULTI-SYMPTOM) 2-5-325 MG TABS Take 2 tablets by mouth 2 (two) times daily.     Cholecalciferol (VITAMIN D ) 50 MCG (2000 UT) CAPS Take 1 capsule by mouth in the morning.     clonazePAM  (KLONOPIN ) 1 MG tablet Take 1 tablet (1 mg total) by mouth 2 (two) times daily. 60 tablet 5   ferrous sulfate 325 (65 FE) MG tablet Take 325 mg by mouth in the morning.     Humidifier MISC Large humidifier. 1 each 0   levETIRAcetam  (KEPPRA ) 1000 MG tablet Take 2 tablets (2,000  mg total) by mouth 2 (two) times daily. 360 tablet 3   MAGNESIUM  PO Take 500 mg by mouth in the morning.     metoprolol  tartrate (LOPRESSOR ) 25 MG tablet TAKE 1 TABLET BY MOUTH TWICE DAILY 180 tablet 1   mirtazapine  (REMERON ) 45 MG tablet TAKE 1 TABLET BY MOUTH AT BEDTIME 30 tablet 5   Multiple Vitamin (MULTIVITAMIN) tablet Take 1 tablet by mouth in the morning.     zinc gluconate 50 MG tablet Take 50 mg by mouth in the morning.     PHENObarbital  (LUMINAL) 100 MG tablet Take 2 tablets (200 mg total) by mouth at bedtime. 180 tablet 3   clonazePAM  (KLONOPIN ) 1 MG tablet Take 1 tablet (1 mg total) by mouth 2 (two) times daily. 60 tablet 5   No facility-administered medications prior to visit.    PAST MEDICAL HISTORY: Past Medical History:  Diagnosis Date   Abnormal  facial hair 05/11/2011   Anxiety and depression    GAD (generalized anxiety disorder)    Headache    Heart murmur    ECHO 06/15/22: SAM of anterior MV leaflet with dynamic LVOT gradient in setting of moderate symmetrical LVH and hyperdynamic LVF, Peak resting gradient mild at 24 mmHg   Hypertension    Insomnia    Psychotic disorder (HCC)    Seizure disorder (HCC)    stress related seizures. 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[facility])     PAST SURGICAL HISTORY: Past Surgical History:  Procedure Laterality Date   ABDOMINAL HYSTERECTOMY     BREAST BIOPSY Left 08/24/2022   US  LT BREAST BX W LOC DEV 1ST LESION IMG BX SPEC US  GUIDE 08/24/2022 [facility]-ULTRASOUND   CESAREAN SECTION     CHOLECYSTECTOMY     COLONOSCOPY WITH PROPOFOL  N/A 02/15/2019   Procedure: COLONOSCOPY WITH PROPOFOL ;  Surgeon: Shaaron Lamar HERO, MD;  Location: [facility];  Service: Endoscopy;  Laterality: N/A;  2:45pm   POLYPECTOMY  02/15/2019   Procedure: POLYPECTOMY;  Surgeon: Shaaron Lamar HERO, MD;  Location: [facility];  Service: Endoscopy;;   TOTAL HIP ARTHROPLASTY Left 01/27/2022   Procedure: TOTAL HIP ARTHROPLASTY ANTERIOR APPROACH;   Surgeon: Jerri Kay HERO, MD;  Location: [facility] OR;  Service: Orthopedics;  Laterality: Left;   VAGUS NERVE STIMULATOR INSERTION Left 02/17/2023   Procedure: VAGAL NERVE STIMULATOR IMPLANT;  Surgeon: Lanis Pupa, MD;  Location: [facility] OR;  Service: Neurosurgery;  Laterality: Left;  3C    FAMILY HISTORY: Family History  Problem Relation Age of Onset   Breast cancer Mother 57   Coronary artery disease Father    Stroke Father 104   Breast cancer Sister 70   Breast cancer Sister    Breast cancer Sister    Diabetes Brother    Heart attack Brother    Colon cancer Neg Hx     SOCIAL HISTORY: Social History   Socioeconomic History   Marital status: Married    Spouse name: Jerel   Number of children: 2   Years of education: 12th   Highest education level: 12th grade  Occupational History   Occupation: disabled  Tobacco Use   Smoking status: Never   Smokeless tobacco: Never  Vaping Use   Vaping status: Never Used  Substance and Sexual Activity   Alcohol use: No   Drug use: No   Sexual activity: Yes    Birth control/protection: Surgical  Other Topics Concern   Not on file  Social History Narrative   Right handed   Caffeine-1 cup daily   Lives with husband      Oldest son passed away in January 09, 2022   Social Drivers of Health   Financial Resource Strain: Low Risk  (07/08/2022)   Overall Financial Resource Strain (CARDIA)    Difficulty of Paying Living Expenses: Not hard at all  Food Insecurity: No Food Insecurity (07/08/2022)   Hunger Vital Sign    Worried About Running Out of Food in the Last Year: Never true    Ran Out of Food in the Last Year: Never true  Transportation Needs: No Transportation Needs (07/08/2022)   PRAPARE - Administrator, Civil Service (Medical): No    Lack of Transportation (Non-Medical): No  Physical Activity: Inactive (07/08/2022)   Exercise Vital Sign    Days of Exercise per Week: 0 days    Minutes of Exercise per Session: 0 min  Stress:  No Stress  Concern Present (07/08/2022)   [employer] of Occupational Health - Occupational Stress Questionnaire    Feeling of Stress : Not at all  Social Connections: Moderately Isolated (07/08/2022)   Social Connection and Isolation Panel [NHANES]    Frequency of Communication with Friends and Family: Three times a week    Frequency of Social Gatherings with Friends and Family: Twice a week    Attends Religious Services: Never    Database Administrator or Organizations: No    Attends Banker Meetings: Never    Marital Status: Married  Catering Manager Violence: Not At Risk (07/08/2022)   Humiliation, Afraid, Rape, and Kick questionnaire    Fear of Current or Ex-Partner: No    Emotionally Abused: No    Physically Abused: No    Sexually Abused: No    PHYSICAL EXAM  GENERAL EXAM/CONSTITUTIONAL: Vitals:  Vitals:   03/08/23 1457  BP: (!) 158/81  Pulse: 64  Weight: 121 lb 14.6 oz (55.3 kg)  Height: 5' 2 (1.575 m)   Body mass index is 22.3 kg/m. Wt Readings from Last 3 Encounters:  03/08/23 121 lb 14.6 oz (55.3 kg)  02/17/23 122 lb (55.3 kg)  02/11/23 121 lb (54.9 kg)   Patient is in no distress; well developed, nourished and groomed; neck is supple  MUSCULOSKELETAL: Gait, strength, tone, movements noted in Neurologic exam below  NEUROLOGIC: MENTAL STATUS:     07/08/2022   11:02 AM  MMSE - Mini Mental State Exam  Not completed: Unable to complete   awake, alert, oriented to person, place and time recent and remote memory intact normal attention and concentration language fluent, comprehension intact, naming intact fund of knowledge appropriate  CRANIAL NERVE:  2nd, 3rd, 4th, 6th - Visual fields full to confrontation, extraocular muscles intact, no nystagmus 5th - facial sensation symmetric 7th - facial strength symmetric 8th - hearing intact 9th - palate elevates symmetrically, uvula midline 11th - shoulder shrug symmetric 12th - tongue protrusion  midline  MOTOR:  normal bulk and tone, at least antigravity in the BUE and BLEs  SENSORY:  normal and symmetric to light touch  COORDINATION:  finger-nose-finger, fine finger movements normal  GAIT/STATION:  Using a walking     DIAGNOSTIC DATA (LABS, IMAGING, TESTING) - I reviewed patient records, labs, notes, testing and imaging myself where available.  Lab Results  Component Value Date   WBC 5.7 02/11/2023   HGB 14.4 02/11/2023   HCT 44.8 02/11/2023   MCV 101.8 (H) 02/11/2023   PLT 425 (H) 02/11/2023      Component Value Date/Time   NA 139 02/11/2023 1319   NA 143 11/18/2022 1333   K 3.4 (L) 02/11/2023 1319   CL 97 (L) 02/11/2023 1319   CO2 29 02/11/2023 1319   GLUCOSE 113 (H) 02/11/2023 1319   BUN <5 (L) 02/11/2023 1319   BUN 4 (L) 11/18/2022 1333   CREATININE 0.60 02/11/2023 1319   CREATININE 0.49 (L) 11/29/2019 0926   CALCIUM 9.8 02/11/2023 1319   PROT 8.0 12/31/2022 1437   PROT 7.5 11/18/2022 1333   ALBUMIN 4.1 12/31/2022 1437   ALBUMIN 4.4 11/18/2022 1333   AST 27 12/31/2022 1437   ALT 24 12/31/2022 1437   ALKPHOS 179 (H) 12/31/2022 1437   BILITOT 0.0 12/31/2022 1437   BILITOT 0.2 11/18/2022 1333   GFRNONAA >60 02/11/2023 1319   GFRNONAA 105 11/29/2019 0926   GFRAA 120 02/27/2020 1508   GFRAA 122 11/29/2019 0926  Lab Results  Component Value Date   CHOL 174 12/09/2021   HDL 64 12/09/2021   LDLCALC 96 12/09/2021   TRIG 72 12/09/2021   Lab Results  Component Value Date   HGBA1C 5.6 02/27/2020   Lab Results  Component Value Date   VITAMINB12 495 12/09/2021   Lab Results  Component Value Date   TSH 1.730 05/11/2022    EEG 2013: Normal   EEG 2024: Left temporal sharps    ASSESSMENT AND PLAN  63 y.o. year old female  with history of longstanding seizure disorder, anxiety/depression, recent fall with left hip fracture s/p surgery who is presenting for follow up.  Her recent EEG showed left temporal discharges. She continues to have  breakthrough seizures despite phenobarbital  200 mg nightly and Keppra  2000 mg twice daily. She is now status post VNS implantation on January 16 , 2025. VNS setting were changes today, patient tolerated procedure well. She also has a guided programming schedule for the next few months.  I will see her in 6 weeks for follow up or sooner if worse.    1. Localization-related symptomatic epilepsy and epileptic syndromes with complex partial seizures, intractable, without status epilepticus (HCC)   2. S/P placement of VNS (vagus nerve stimulation) device      Patient Instructions  Continue current medications  VNS turn on and setting changed. Patient tolerated procedure well  Return in 6 weeks for additional setting changes.  Please contact us  for any concerns or any breakthrough seizure.   Per [place] statutes, patients with seizures are not allowed to drive until they have been seizure-free for six months.  Other recommendations include using caution when using heavy equipment or power tools. Avoid working on ladders or at heights. Take showers instead of baths.  Do not swim alone.  Ensure the water  temperature is not too high on the home water  heater. Do not go swimming alone. Do not lock yourself in a room alone (i.e. bathroom). When caring for infants or small children, sit down when holding, feeding, or changing them to minimize risk of injury to the child in the event you have a seizure. Maintain good sleep hygiene. Avoid alcohol.  Also recommend adequate sleep, hydration, good diet and minimize stress.   During the Seizure  - First, ensure adequate ventilation and place patients on the floor on their left side  Loosen clothing around the neck and ensure the airway is patent. If the patient is clenching the teeth, do not force the mouth open with any object as this can cause severe damage - Remove all items from the surrounding that can be hazardous. The patient may be oblivious to  what's happening and may not even know what he or she is doing. If the patient is confused and wandering, either gently guide him/her away and block access to outside areas - Reassure the individual and be comforting - Call 911. In most cases, the seizure ends before EMS arrives. However, there are cases when seizures may last over 3 to 5 minutes. Or the individual may have developed breathing difficulties or severe injuries. If a pregnant patient or a person with diabetes develops a seizure, it is prudent to call an ambulance. - Finally, if the patient does not regain full consciousness, then call EMS. Most patients will remain confused for about 45 to 90 minutes after a seizure, so you must use judgment in calling for help. - Avoid restraints but make sure the patient is in a  bed with padded side rails - Place the individual in a lateral position with the neck slightly flexed; this will help the saliva drain from the mouth and prevent the tongue from falling backward - Remove all nearby furniture and other hazards from the area - Provide verbal assurance as the individual is regaining consciousness - Provide the patient with privacy if possible - Call for help and start treatment as ordered by the caregiver   After the Seizure (Postictal Stage)  After a seizure, most patients experience confusion, fatigue, muscle pain and/or a headache. Thus, one should permit the individual to sleep. For the next few days, reassurance is essential. Being calm and helping reorient the person is also of importance.  Most seizures are painless and end spontaneously. Seizures are not harmful to others but can lead to complications such as stress on the lungs, brain and the heart. Individuals with prior lung problems may develop labored breathing and respiratory distress.     Orders Placed This Encounter  Procedures   Vagal Nerve Stimulation    No orders of the defined types were placed in this  encounter.   Return in about 6 weeks (around 04/19/2023).    Pastor Falling, MD 03/13/2023, 2:45 PM  [facility] [address] [place], [place] [address] [phone]

## 2023-03-10 ENCOUNTER — Telehealth: Payer: Self-pay | Admitting: Neurology

## 2023-03-10 MED ORDER — PHENOBARBITAL 100 MG PO TABS: 200.0000 mg | ORAL_TABLET | Freq: Every day | ORAL | 3 refills | Status: DC

## 2023-03-10 NOTE — Telephone Encounter (Signed)
 Pt called wanting to know if the provider can write an updated Rx for her PHENObarbital  (LUMINAL) 100 MG tablet. She states she is running out of the medication before the next refill date. Please advise.

## 2023-03-10 NOTE — Telephone Encounter (Signed)
 Last seen 03/08/23, next appt 04/19/23 Dispenses   Dispensed Days Supply Quantity Provider Pharmacy  phenobarbital  100 mg tablet 12/29/2022 90 180 each Gregg Lek, MD [facility] [place]  phenobarbital  100 mg tablet 10/10/2022 98 168 each Gregg Lek, MD [facility] [place] ...  phenobarbital  100 mg tablet 07/06/2022 98 168 each Gregg Lek, MD [facility] [place], ...  phenobarbital  100 mg tablet 06/08/2022 28 48 each Gregg Lek, MD [facility]  phenobarbital  100 mg tablet 06/01/2022 9 15 each Gregg Lek, MD [facility]  phenobarbital  100 mg tablet 05/11/2022 28 48 each Gregg Lek, MD [facility]  phenobarbital  100 mg tablet 04/13/2022 28 48 each Gregg Lek, MD [facility]  phenobarbital  100 mg tablet 03/19/2022 28 48 each Gregg Lek, MD [facility]      Routing to work in provider to refill

## 2023-03-10 NOTE — Telephone Encounter (Signed)
 Meds ordered this encounter  Medications   PHENObarbital  (LUMINAL) 100 MG tablet    Sig: Take 2 tablets (200 mg total) by mouth at bedtime.    Dispense:  180 tablet    Refill:  3    Please updated this refill. Disregard the earlier refill of this medication

## 2023-03-15 DIAGNOSIS — R059 Cough, unspecified: Secondary | ICD-10-CM | POA: Diagnosis not present

## 2023-03-15 DIAGNOSIS — R197 Diarrhea, unspecified: Secondary | ICD-10-CM | POA: Diagnosis not present

## 2023-03-15 DIAGNOSIS — R030 Elevated blood-pressure reading, without diagnosis of hypertension: Secondary | ICD-10-CM | POA: Diagnosis not present

## 2023-03-15 DIAGNOSIS — R1110 Vomiting, unspecified: Secondary | ICD-10-CM | POA: Diagnosis not present

## 2023-03-25 DIAGNOSIS — R0602 Shortness of breath: Secondary | ICD-10-CM | POA: Diagnosis not present

## 2023-03-28 ENCOUNTER — Other Ambulatory Visit: Payer: Self-pay | Admitting: Family Medicine

## 2023-03-28 ENCOUNTER — Telehealth: Payer: Self-pay | Admitting: Neurology

## 2023-03-28 DIAGNOSIS — M810 Age-related osteoporosis without current pathological fracture: Secondary | ICD-10-CM

## 2023-03-28 NOTE — Telephone Encounter (Signed)
 Pt called in asking to cb from nurse asap, did not elaborate further on VM.

## 2023-03-28 NOTE — Telephone Encounter (Signed)
 Thank you :)

## 2023-03-28 NOTE — Telephone Encounter (Signed)
 Returned call to patient, she complains of shortness of breath after having the flu. She went to urgent care and everything checked fine. Explained that common side effect of VNS is hoarse voice, triggering a cough. We reviewed setting and patient stated she will swipe more to get body accustomed to stimulation. Advised I will inform Dr. Teresa Coombs.

## 2023-04-19 ENCOUNTER — Ambulatory Visit (INDEPENDENT_AMBULATORY_CARE_PROVIDER_SITE_OTHER): Payer: 59 | Admitting: Neurology

## 2023-04-19 ENCOUNTER — Encounter: Payer: Self-pay | Admitting: Neurology

## 2023-04-19 VITALS — BP 138/86 | HR 84

## 2023-04-19 DIAGNOSIS — Z9689 Presence of other specified functional implants: Secondary | ICD-10-CM

## 2023-04-19 DIAGNOSIS — G40219 Localization-related (focal) (partial) symptomatic epilepsy and epileptic syndromes with complex partial seizures, intractable, without status epilepticus: Secondary | ICD-10-CM

## 2023-04-19 NOTE — Patient Instructions (Signed)
 Continue current medications including phenobarbital 200 mg nightly and Keppra 2000 mg twice daily Please call if you do have another seizure Follow-up on May 5 as scheduled.

## 2023-04-19 NOTE — Progress Notes (Signed)
 [facility]  PATIENT: Sandra Riley DOB: 11-09-1960  REQUESTING CLINICIAN: Kerri Perches, MD HISTORY FROM: Patient, husband and chart review  REASON FOR VISIT: Seizure disorder follow up    HISTORICAL  CHIEF COMPLAINT:  Chief Complaint  Patient presents with   Follow-up    Pt in 13,   Pt is here for VNS reprogramming. Reviewed VNS settings and side effects.     INTERVAL HISTORY 04/19/2023:  Patient presents today for follow-up, last visit was in February 4.  Since then she has been doing well, she tells me that she had 2 light seizures, on February 15 and the last 1 on March 3.  With the seizures she feels lightheaded, dizzy and sometimes there is confusion.  She is tolerating the VNS settings well, also on swipe at least once a day.  No other complaints, no other concerns.   INTERVAL HISTORY 03/08/2023:  Patient presents today for follow up. She is accompanied by her husband. Last visit was in OctobeR, at that time we have increase her Phenobarbital to 200 mg nightly and Keppra to 2000 mg twice daily. She continued to have breakthrough seizures. She had her VNS implanted on January 16, reports one seizure since implantation of VNS, possibly due to missed medication.   INTERVAL HISTORY 11/18/2022 Patient presents today for follow-up, last visit was in January, since then, she tells me that she had her hip surgery but continues to have breakthrough seizures.  She reports at least 2 seizures during her sleep.  Husband is not aware of the seizures but patient tells me that when she wakes up in the morning, she feels tired, feels drowsy, weak and has a bad taste in her mouth and that is how she knows she had a seizure.   Husband tells me about once a month she will have episode of behavioral arrest, unresponsiveness, looking like she is in a trance.  She is on phenobarbital 200 mg nightly Monday to Friday and 100 mg nightly Saturday and Sunday and Keppra 1500 mg  twice daily but still having breakthrough seizure. Her routine EEG showed left temporal epileptiform discharges.    HISTORY OF PRESENT ILLNESS:  This is a 63 year old woman past medical history of seizure disorder, anxiety, depression, osteoporosis, recent fall with hip fracture who is presenting to establish care.  Patient reports a long history of seizure diagnosed at the age of 36.  Initially she describes her seizures as generalized convulsion with tongue biting.  He was on phenobarbital and Dilantin.  She reported her seizure was uncontrolled until she initially follow-up with Dr. Gerilyn Pilgrim who added levetiracetam.  Since being on levetiracetam and phenobarbital she has not had any generalized convulsion and this is more than 20 years ago.  Now she is reporting seizure that she described as disorientation and losing track of time and confusion.  She still having those and they have been worse in the past year since her son died\.  Currently she is on phenobarbital 97.2 mg 2 tabs nightly and Keppra 1500 mg twice daily.  While in rehab, she was told that her phenobarbital level was elevated therefore it was decreased to 1 tablet on Saturday and Sunday.  She denies any symptoms  associated with phenobarb toxicity and she reports that her last seizure that she described as disorientation and losing track of time was in December 2023 since then she has not had any additional seizures.  She is tolerating the medications very well and denies  any side effect. She denies any seizure risk factors, denies any family history of seizures denies any injury from seizures. Her fall and hip fracture was not related to a seizure.   Handedness: Right handed   Onset: At the age of 5  Seizure Type: Disorientation, losing track of time, last convulsion more than 20 year ago   Current frequency: Last seiure was in Dec 2023  Any injuries from seizures: Tongue biting   Seizure risk factors: Denies   Previous ASMs:  Phenobarbital, Levetiracetam, Dilantin,   Currenty ASMs: Phenobarbital 200 mg nightly, and Levetiracetam 2000 mg twice daily  ASMs side effects: None   Brain Images: None available for review   Previous EEGs: EEG 2013: Normal    OTHER MEDICAL CONDITIONS: Seizure disorder, recent fall and hip fracture, Hypertension, osteoporosis, anxiety/depression   REVIEW OF SYSTEMS: Full 14 system review of systems performed and negative with exception of: As noted in the HPI   ALLERGIES: Allergies  Allergen Reactions   Aspirin     Allergic to non coated ASA Caused gastric ulcers    Nsaids Other (See Comments)    Directed by MD to not take.    Singulair [Montelukast Sodium] Other (See Comments)    Became sweaty and nuaseated    HOME MEDICATIONS: Outpatient Medications Prior to Visit  Medication Sig Dispense Refill   acetaminophen (TYLENOL) 500 MG tablet Take 1,000 mg by mouth every 6 (six) hours as needed for moderate pain.     alendronate (FOSAMAX) 35 MG tablet TAKE 1 TABLET BY MOUTH ONCE WEEKLY IN THE MORNING with full GLASS of water ON an EMPTY stomach DO not lay down FOR 30 mins 5 tablet 5   amLODipine (NORVASC) 2.5 MG tablet TAKE 1 TABLET BY MOUTH DAILY 90 tablet 1   BIOTIN 5000 PO Take 5,000 mcg by mouth in the morning.     Calcium Carb-Cholecalciferol 1000-800 MG-UNIT TABS Take 2 tablets by mouth in the morning.     Camphor-Eucalyptus-Menthol (VICKS VAPORUB EX) Apply 1 Application topically as needed (congestion.).     Chlorphen-Phenyleph-APAP (ALLERGY MULTI-SYMPTOM) 2-5-325 MG TABS Take 2 tablets by mouth 2 (two) times daily.     Cholecalciferol (VITAMIN D) 50 MCG (2000 UT) CAPS Take 1 capsule by mouth in the morning.     clonazePAM (KLONOPIN) 1 MG tablet Take 1 tablet (1 mg total) by mouth 2 (two) times daily. 60 tablet 5   ferrous sulfate 325 (65 FE) MG tablet Take 325 mg by mouth in the morning.     Humidifier MISC Large humidifier. 1 each 0   levETIRAcetam (KEPPRA) 1000 MG  tablet Take 2 tablets (2,000 mg total) by mouth 2 (two) times daily. 360 tablet 3   MAGNESIUM PO Take 500 mg by mouth in the morning.     metoprolol tartrate (LOPRESSOR) 25 MG tablet TAKE 1 TABLET BY MOUTH TWICE DAILY 180 tablet 1   mirtazapine (REMERON) 45 MG tablet TAKE 1 TABLET BY MOUTH AT BEDTIME 30 tablet 5   Multiple Vitamin (MULTIVITAMIN) tablet Take 1 tablet by mouth in the morning.     PHENObarbital (LUMINAL) 100 MG tablet Take 2 tablets (200 mg total) by mouth at bedtime. 180 tablet 3   zinc gluconate 50 MG tablet Take 50 mg by mouth in the morning.     No facility-administered medications prior to visit.    PAST MEDICAL HISTORY: Past Medical History:  Diagnosis Date   Abnormal facial hair 05/11/2011   Anxiety and depression  GAD (generalized anxiety disorder)    Headache    Heart murmur    ECHO 06/15/22: SAM of anterior MV leaflet with dynamic LVOT gradient in setting of moderate symmetrical LVH and hyperdynamic LVF, Peak resting gradient mild at 24 mmHg   Hypertension    Insomnia    Psychotic disorder (HCC)    Seizure disorder (HCC)    stress related seizures. 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[facility])     PAST SURGICAL HISTORY: Past Surgical History:  Procedure Laterality Date   ABDOMINAL HYSTERECTOMY     BREAST BIOPSY Left 08/24/2022   [place] LT BREAST BX W LOC DEV 1ST LESION IMG BX SPEC US GUIDE 08/24/2022 [facility]-ULTRASOUND   CESAREAN SECTION     CHOLECYSTECTOMY     COLONOSCOPY WITH PROPOFOL N/A 02/15/2019   Procedure: COLONOSCOPY WITH PROPOFOL;  Surgeon: Corbin Ade, MD;  Location: [facility];  Service: Endoscopy;  Laterality: N/A;  2:45pm   POLYPECTOMY  02/15/2019   Procedure: POLYPECTOMY;  Surgeon: Corbin Ade, MD;  Location: [facility];  Service: Endoscopy;;   TOTAL HIP ARTHROPLASTY Left 01/27/2022   Procedure: TOTAL HIP ARTHROPLASTY ANTERIOR APPROACH;  Surgeon: Tarry Kos, MD;  Location: [facility] OR;  Service: Orthopedics;  Laterality:  Left;   VAGUS NERVE STIMULATOR INSERTION Left 02/17/2023   Procedure: VAGAL NERVE STIMULATOR IMPLANT;  Surgeon: Lisbeth Renshaw, MD;  Location: [facility] OR;  Service: Neurosurgery;  Laterality: Left;  3C    FAMILY HISTORY: Family History  Problem Relation Age of Onset   Breast cancer Mother 64   Coronary artery disease Father    Stroke Father 74   Breast cancer Sister 7   Breast cancer Sister    Breast cancer Sister    Diabetes Brother    Heart attack Brother    Colon cancer Neg Hx     SOCIAL HISTORY: Social History   Socioeconomic History   Marital status: Married    Spouse name: Aurther Loft   Number of children: 2   Years of education: 12th   Highest education level: 12th grade  Occupational History   Occupation: disabled  Tobacco Use   Smoking status: Never   Smokeless tobacco: Never  Vaping Use   Vaping status: Never Used  Substance and Sexual Activity   Alcohol use: No   Drug use: No   Sexual activity: Yes    Birth control/protection: Surgical  Other Topics Concern   Not on file  Social History Narrative   Right handed   Caffeine-1 cup daily   Lives with husband      Oldest son passed away in April 29, 2021   Social Drivers of Health   Financial Resource Strain: Low Risk  (07/08/2022)   Overall Financial Resource Strain (CARDIA)    Difficulty of Paying Living Expenses: Not hard at all  Food Insecurity: No Food Insecurity (07/08/2022)   Hunger Vital Sign    Worried About Running Out of Food in the Last Year: Never true    Ran Out of Food in the Last Year: Never true  Transportation Needs: No Transportation Needs (07/08/2022)   PRAPARE - Administrator, Civil Service (Medical): No    Lack of Transportation (Non-Medical): No  Physical Activity: Inactive (07/08/2022)   Exercise Vital Sign    Days of Exercise per Week: 0 days    Minutes of Exercise per Session: 0 min  Stress: No Stress Concern Present (07/08/2022)   [employer] of Occupational  Health -  Occupational Stress Questionnaire    Feeling of Stress : Not at all  Social Connections: Moderately Isolated (07/08/2022)   Social Connection and Isolation Panel [NHANES]    Frequency of Communication with Friends and Family: Three times a week    Frequency of Social Gatherings with Friends and Family: Twice a week    Attends Religious Services: Never    Database administrator or Organizations: No    Attends Banker Meetings: Never    Marital Status: Married  Catering manager Violence: Not At Risk (07/08/2022)   Humiliation, Afraid, Rape, and Kick questionnaire    Fear of Current or Ex-Partner: No    Emotionally Abused: No    Physically Abused: No    Sexually Abused: No    PHYSICAL EXAM  GENERAL EXAM/CONSTITUTIONAL: Vitals:  Vitals:   04/19/23 1521  BP: 138/86  Pulse: 84   There is no height or weight on file to calculate BMI. Wt Readings from Last 3 Encounters:  03/08/23 121 lb 14.6 oz (55.3 kg)  02/17/23 122 lb (55.3 kg)  02/11/23 121 lb (54.9 kg)   Patient is in no distress; well developed, nourished and groomed; neck is supple  MUSCULOSKELETAL: Gait, strength, tone, movements noted in Neurologic exam below  NEUROLOGIC: MENTAL STATUS:     07/08/2022   11:02 AM  MMSE - Mini Mental State Exam  Not completed: Unable to complete   awake, alert, oriented to person, place and time recent and remote memory intact normal attention and concentration language fluent, comprehension intact, naming intact fund of knowledge appropriate  CRANIAL NERVE:  2nd, 3rd, 4th, 6th - Visual fields full to confrontation, extraocular muscles intact, no nystagmus 5th - facial sensation symmetric 7th - facial strength symmetric 8th - hearing intact 9th - palate elevates symmetrically, uvula midline 11th - shoulder shrug symmetric 12th - tongue protrusion midline  MOTOR:  normal bulk and tone, at least antigravity in the BUE and BLEs  SENSORY:  normal and symmetric to  light touch  COORDINATION:  finger-nose-finger, fine finger movements normal  GAIT/STATION:  Using a rollator      DIAGNOSTIC DATA (LABS, IMAGING, TESTING) - I reviewed patient records, labs, notes, testing and imaging myself where available.  Lab Results  Component Value Date   WBC 5.7 02/11/2023   HGB 14.4 02/11/2023   HCT 44.8 02/11/2023   MCV 101.8 (H) 02/11/2023   PLT 425 (H) 02/11/2023      Component Value Date/Time   NA 139 02/11/2023 1319   NA 143 11/18/2022 1333   K 3.4 (L) 02/11/2023 1319   CL 97 (L) 02/11/2023 1319   CO2 29 02/11/2023 1319   GLUCOSE 113 (H) 02/11/2023 1319   BUN <5 (L) 02/11/2023 1319   BUN 4 (L) 11/18/2022 1333   CREATININE 0.60 02/11/2023 1319   CREATININE 0.49 (L) 11/29/2019 0926   CALCIUM 9.8 02/11/2023 1319   PROT 8.0 12/31/2022 1437   PROT 7.5 11/18/2022 1333   ALBUMIN 4.1 12/31/2022 1437   ALBUMIN 4.4 11/18/2022 1333   AST 27 12/31/2022 1437   ALT 24 12/31/2022 1437   ALKPHOS 179 (H) 12/31/2022 1437   BILITOT 0.0 12/31/2022 1437   BILITOT 0.2 11/18/2022 1333   GFRNONAA >60 02/11/2023 1319   GFRNONAA 105 11/29/2019 0926   GFRAA 120 02/27/2020 1508   GFRAA 122 11/29/2019 0926   Lab Results  Component Value Date   CHOL 174 12/09/2021   HDL 64 12/09/2021   LDLCALC  96 12/09/2021   TRIG 72 12/09/2021   Lab Results  Component Value Date   HGBA1C 5.6 02/27/2020   Lab Results  Component Value Date   VITAMINB12 495 12/09/2021   Lab Results  Component Value Date   TSH 1.730 05/11/2022    EEG 2013: Normal   EEG 2024: Left temporal sharps    ASSESSMENT AND PLAN  63 y.o. year old female  with history of longstanding seizure disorder, anxiety/depression, recent fall with left hip fracture s/p surgery who is presenting for follow up.  Her recent EEG showed left temporal discharges. She continues to have breakthrough seizures despite phenobarbital 200 mg nightly and Keppra 2000 mg twice daily. She is now status post VNS  implantation on January 16 , 2025.  She is tolerating the schedule VNS titration well, on occasion has hoarseness but no major cough, no pain.  Will continue titration and patient will present on May 5, at that time we will make additional changes to the VNS.  She is comfortable with plans.   1. Localization-related symptomatic epilepsy and epileptic syndromes with complex partial seizures, intractable, without status epilepticus (HCC)   2. S/P placement of VNS (vagus nerve stimulation) device       Patient Instructions  Continue current medications including phenobarbital 200 mg nightly and Keppra 2000 mg twice daily Please call if you do have another seizure Follow-up on May 5 as scheduled.   Per [facility] statutes, patients with seizures are not allowed to drive until they have been seizure-free for six months.  Other recommendations include using caution when using heavy equipment or power tools. Avoid working on ladders or at heights. Take showers instead of baths.  Do not swim alone.  Ensure the water temperature is not too high on the home water heater. Do not go swimming alone. Do not lock yourself in a room alone (i.e. bathroom). When caring for infants or small children, sit down when holding, feeding, or changing them to minimize risk of injury to the child in the event you have a seizure. Maintain good sleep hygiene. Avoid alcohol.  Also recommend adequate sleep, hydration, good diet and minimize stress.   During the Seizure  - First, ensure adequate ventilation and place patients on the floor on their left side  Loosen clothing around the neck and ensure the airway is patent. If the patient is clenching the teeth, do not force the mouth open with any object as this can cause severe damage - Remove all items from the surrounding that can be hazardous. The patient may be oblivious to what's happening and may not even know what he or she is doing. If the patient is confused  and wandering, either gently guide him/her away and block access to outside areas - Reassure the individual and be comforting - Call 911. In most cases, the seizure ends before EMS arrives. However, there are cases when seizures may last over 3 to 5 minutes. Or the individual may have developed breathing difficulties or severe injuries. If a pregnant patient or a person with diabetes develops a seizure, it is prudent to call an ambulance. - Finally, if the patient does not regain full consciousness, then call EMS. Most patients will remain confused for about 45 to 90 minutes after a seizure, so you must use judgment in calling for help. - Avoid restraints but make sure the patient is in a bed with padded side rails - Place the individual in a lateral position with  the neck slightly flexed; this will help the saliva drain from the mouth and prevent the tongue from falling backward - Remove all nearby furniture and other hazards from the area - Provide verbal assurance as the individual is regaining consciousness - Provide the patient with privacy if possible - Call for help and start treatment as ordered by the caregiver   After the Seizure (Postictal Stage)  After a seizure, most patients experience confusion, fatigue, muscle pain and/or a headache. Thus, one should permit the individual to sleep. For the next few days, reassurance is essential. Being calm and helping reorient the person is also of importance.  Most seizures are painless and end spontaneously. Seizures are not harmful to others but can lead to complications such as stress on the lungs, brain and the heart. Individuals with prior lung problems may develop labored breathing and respiratory distress.     Orders Placed This Encounter  Procedures   Vagal Nerve Stimulation    No orders of the defined types were placed in this encounter.   Return in about 7 weeks (around 06/06/2023).    Windell Norfolk, MD 04/19/2023, 3:57  PM  [facility] [address] [place], [place] [address] [phone]

## 2023-04-20 ENCOUNTER — Ambulatory Visit: Payer: Self-pay | Admitting: Family Medicine

## 2023-04-20 ENCOUNTER — Ambulatory Visit: Payer: 59 | Admitting: Family Medicine

## 2023-04-20 NOTE — Telephone Encounter (Signed)
  Chief Complaint: cough , congestion, ear pain Symptoms: cough , congestion  ear pain , left  ear less hearing the right , reports feeling too tired to come for OV today wanted to reschedule Frequency: yesterday  Pertinent Negatives: Patient denies chest pain no difficulty breathing no SOB no fever reported.  Disposition: [] ED /[] Urgent Care (no appt availability in office) / [x] Appointment(In office/virtual)/ []  [facility]/ [] Home Care/ [] Refused Recommended Disposition /[] [facility]/ []  Follow-up with PCP Additional Notes:   Patient requesting to cancel appt today  "Feeling too tired".  Attempted to motivate patient to keep appt if possible to address sx with PCP . Patient declined and appt rescheduled for 04/22/23 for sx of cough, congestion ear pain. Patient did not want to reschedule f/u March appt at this time.  Patient did not want to reschedule any earlier.  Recommended if sx worsen call back .      Copied from CRM [id number]. Topic: Clinical - Red Word Triage >> Apr 20, 2023  8:05 AM Elle L wrote: Red Word that prompted transfer to Nurse Triage: The patient states she has a cough, congestion and her ears are hurting. Reason for Disposition  Earache is present  Answer Assessment - Initial Assessment Questions 1. ONSET: "When did the cough begin?"      Yesterday  2. SEVERITY: "How bad is the cough today?"      Na  3. SPUTUM: "Describe the color of your sputum" (none, dry cough; clear, white, yellow, green)     None  4. HEMOPTYSIS: "Are you coughing up any blood?" If so ask: "How much?" (flecks, streaks, tablespoons, etc.)     Na 5. DIFFICULTY BREATHING: "Are you having difficulty breathing?" If Yes, ask: "How bad is it?" (e.g., mild, moderate, severe)    - MILD: No SOB at rest, mild SOB with walking, speaks normally in sentences, can lie down, no retractions, pulse < 100.    - MODERATE: SOB at rest, SOB with minimal exertion and prefers to sit, cannot lie  down flat, speaks in phrases, mild retractions, audible wheezing, pulse 100-120.    - SEVERE: Very SOB at rest, speaks in single words, struggling to breathe, sitting hunched forward, retractions, pulse > 120      Denies  6. FEVER: "Do you have a fever?" If Yes, ask: "What is your temperature, how was it measured, and when did it start?"     na 7. CARDIAC HISTORY: "Do you have any history of heart disease?" (e.g., heart attack, congestive heart failure)      Hx  8. LUNG HISTORY: "Do you have any history of lung disease?"  (e.g., pulmonary embolus, asthma, emphysema)     na 9. PE RISK FACTORS: "Do you have a history of blood clots?" (or: recent major surgery, recent prolonged travel, bedridden)     na 10. OTHER SYMPTOMS: "Do you have any other symptoms?" (e.g., runny nose, wheezing, chest pain)       Left ear soreness more than right cough , congestion 11. PREGNANCY: "Is there any chance you are pregnant?" "When was your last menstrual period?"       na 12. TRAVEL: "Have you traveled out of the country in the last month?" (e.g., travel history, exposures)       na  Protocols used: Cough - Acute Non-Productive-A-AH

## 2023-04-20 NOTE — Telephone Encounter (Signed)
 noted

## 2023-04-22 ENCOUNTER — Encounter: Payer: Self-pay | Admitting: Family Medicine

## 2023-04-22 ENCOUNTER — Ambulatory Visit: Admitting: Family Medicine

## 2023-04-22 VITALS — BP 158/94 | HR 70 | Ht 62.0 in | Wt 120.1 lb

## 2023-04-22 DIAGNOSIS — B349 Viral infection, unspecified: Secondary | ICD-10-CM | POA: Diagnosis not present

## 2023-04-22 DIAGNOSIS — G40209 Localization-related (focal) (partial) symptomatic epilepsy and epileptic syndromes with complex partial seizures, not intractable, without status epilepticus: Secondary | ICD-10-CM | POA: Insufficient documentation

## 2023-04-22 MED ORDER — PROMETHAZINE-DM 6.25-15 MG/5ML PO SYRP
5.0000 mL | ORAL_SOLUTION | Freq: Four times a day (QID) | ORAL | 0 refills | Status: DC | PRN
Start: 2023-04-22 — End: 2023-06-30

## 2023-04-22 NOTE — Patient Instructions (Signed)

## 2023-04-22 NOTE — Progress Notes (Signed)
 Established Patient Office Visit   Subjective  Patient ID: Sandra Riley, female    DOB: March 07, 1960  Age: 63 y.o. MRN: [id number]  Chief Complaint  Patient presents with   Acute Visit    Nasal congestion along w/ cough, and ear pain, mainly in the left ear  that started Sunday     She  has a past medical history of Abnormal facial hair (05/11/2011), Anxiety and depression, GAD (generalized anxiety disorder), Headache, Heart murmur, Hypertension, Insomnia, Psychotic disorder (HCC), Seizure disorder (HCC), and Shared psychotic disorder (HCC).  Patient complains of cough. Patient describes symptoms of shortness of breath, left ear pain cough, fatigue, headache, malaise, myalgias, and sore throat. Symptoms began several days ago and are unchanged since that time. Patient denies chest pain or nausea and vomiting. Treatment thus far includes OTC analgesics/antipyretics: somewhat effective and anti-tussive: somewhat effective Past pulmonary history is significant for no history of pneumonia or bronchitis     Review of Systems  Constitutional:  Positive for malaise/fatigue. Negative for chills and fever.  HENT:  Positive for ear pain.   Respiratory:  Positive for cough and shortness of breath.   Cardiovascular:  Negative for chest pain.  Neurological:  Positive for headaches. Negative for dizziness.      Objective:     BP (!) 158/94   Pulse 70   Ht 5\' 2"  (1.575 m)   Wt 120 lb 1.9 oz (54.5 kg)   SpO2 95%   BMI 21.97 kg/m  BP Readings from Last 3 Encounters:  04/22/23 (!) 158/94  04/19/23 138/86  03/08/23 (!) 158/81      Physical Exam Vitals reviewed.  Constitutional:      General: She is not in acute distress.    Appearance: Normal appearance. She is not ill-appearing, toxic-appearing or diaphoretic.  HENT:     Head: Normocephalic.     Right Ear: Tympanic membrane normal.     Left Ear: Tympanic membrane normal.     Nose: Congestion present.     Mouth/Throat:      Pharynx: Posterior oropharyngeal erythema present.  Eyes:     General:        Right eye: No discharge.        Left eye: No discharge.     Conjunctiva/sclera: Conjunctivae normal.  Cardiovascular:     Rate and Rhythm: Normal rate.     Pulses: Normal pulses.     Heart sounds: Normal heart sounds.  Pulmonary:     Effort: Pulmonary effort is normal. No respiratory distress.     Breath sounds: Normal breath sounds.  Skin:    General: Skin is warm and dry.     Capillary Refill: Capillary refill takes less than 2 seconds.  Neurological:     Mental Status: She is alert.  Psychiatric:        Mood and Affect: Mood normal.        Behavior: Behavior normal.      No results found for any visits on 04/22/23.  The 10-year ASCVD risk score (Arnett DK, et al., 2019) is: 12.1%    Assessment & Plan:  Viral illness Assessment & Plan: COVID-19, Flu A+B RSV  ordered   promethazine syrup PRN, Xyzal 5 mg at bedtime. Advise patient to rest to support your body's recovery. Stay hydrated by drinking water, tea, or broth. Using a humidifier can help soothe throat irritation and ease nasal congestion. For fever or pain, acetaminophen (Tylenol) is recommended. To relieve other symptoms, try  saline nasal sprays, throat lozenges, or gargling with saltwater. Focus on eating light, healthy meals like fruits and vegetables to keep your strength up. Practice good hygiene by washing your hands frequently and covering your mouth when coughing or sneezing.Follow-up for worsening or persistent symptoms. Patient verbalizes understanding regarding plan of care and all questions answered    Orders: -     COVID-19, Flu A+B and RSV -     Promethazine-DM; Take 5 mLs by mouth 4 (four) times daily as needed.  Dispense: 118 mL; Refill: 0    Return if symptoms worsen or fail to improve.   Cruzita Lederer Newman Nip, FNP

## 2023-04-22 NOTE — Assessment & Plan Note (Signed)
 COVID-19, Flu A+B RSV  ordered   promethazine syrup PRN, Xyzal 5 mg at bedtime. Advise patient to rest to support your body's recovery. Stay hydrated by drinking water, tea, or broth. Using a humidifier can help soothe throat irritation and ease nasal congestion. For fever or pain, acetaminophen (Tylenol) is recommended. To relieve other symptoms, try saline nasal sprays, throat lozenges, or gargling with saltwater. Focus on eating light, healthy meals like fruits and vegetables to keep your strength up. Practice good hygiene by washing your hands frequently and covering your mouth when coughing or sneezing.Follow-up for worsening or persistent symptoms. Patient verbalizes understanding regarding plan of care and all questions answered

## 2023-04-25 ENCOUNTER — Telehealth: Payer: Self-pay | Admitting: Family Medicine

## 2023-04-25 NOTE — Telephone Encounter (Unsigned)
 Copied from CRM [id number]. Topic: Clinical - Red Word Triage >> Apr 20, 2023  8:05 AM Elle L wrote: Red Word that prompted transfer to Nurse Triage: The patient states she has a cough, congestion and her ears are hurting. >> Apr 22, 2023  3:44 PM Emylou G wrote: Patient adv -- doesn't have 20 to pay for medication.. medicaid won't cover - do you have alternative medication she can take.. pls call patient back.. [phone].VHQIO'N remember the medication.

## 2023-04-26 LAB — COVID-19, FLU A+B AND RSV
Influenza A, NAA: NOT DETECTED
Influenza B, NAA: NOT DETECTED
RSV, NAA: NOT DETECTED
SARS-CoV-2, NAA: NOT DETECTED

## 2023-04-27 NOTE — Telephone Encounter (Signed)
Pt aware.

## 2023-05-24 ENCOUNTER — Other Ambulatory Visit: Payer: Self-pay | Admitting: Family Medicine

## 2023-06-04 DIAGNOSIS — Z886 Allergy status to analgesic agent status: Secondary | ICD-10-CM | POA: Diagnosis not present

## 2023-06-04 DIAGNOSIS — Z6821 Body mass index (BMI) 21.0-21.9, adult: Secondary | ICD-10-CM | POA: Diagnosis not present

## 2023-06-04 DIAGNOSIS — Z760 Encounter for issue of repeat prescription: Secondary | ICD-10-CM | POA: Diagnosis not present

## 2023-06-04 DIAGNOSIS — Z79899 Other long term (current) drug therapy: Secondary | ICD-10-CM | POA: Diagnosis not present

## 2023-06-04 DIAGNOSIS — I10 Essential (primary) hypertension: Secondary | ICD-10-CM | POA: Diagnosis not present

## 2023-06-04 DIAGNOSIS — Z7982 Long term (current) use of aspirin: Secondary | ICD-10-CM | POA: Diagnosis not present

## 2023-06-06 ENCOUNTER — Ambulatory Visit: Payer: Self-pay | Admitting: *Deleted

## 2023-06-06 ENCOUNTER — Telehealth: Payer: Self-pay

## 2023-06-06 ENCOUNTER — Other Ambulatory Visit: Payer: Self-pay | Admitting: Family Medicine

## 2023-06-06 ENCOUNTER — Telehealth: Payer: Self-pay | Admitting: Family Medicine

## 2023-06-06 ENCOUNTER — Encounter: Payer: Self-pay | Admitting: Neurology

## 2023-06-06 ENCOUNTER — Ambulatory Visit (INDEPENDENT_AMBULATORY_CARE_PROVIDER_SITE_OTHER): Payer: 59 | Admitting: Neurology

## 2023-06-06 VITALS — BP 118/66 | Ht 62.0 in | Wt 121.0 lb

## 2023-06-06 DIAGNOSIS — G40219 Localization-related (focal) (partial) symptomatic epilepsy and epileptic syndromes with complex partial seizures, intractable, without status epilepticus: Secondary | ICD-10-CM

## 2023-06-06 DIAGNOSIS — Z9689 Presence of other specified functional implants: Secondary | ICD-10-CM | POA: Diagnosis not present

## 2023-06-06 MED ORDER — CLONAZEPAM 1 MG PO TABS
1.0000 mg | ORAL_TABLET | Freq: Two times a day (BID) | ORAL | 0 refills | Status: DC
Start: 2023-06-06 — End: 2023-06-30

## 2023-06-06 NOTE — Telephone Encounter (Signed)
 Called patient to review sx of cold sx and patient reports she has "missplaced" her medication and needs a refill on klonopin  1 mg. Went to ED and was given enough meds until she could contact PCP today . Patient also reports medicare will not pay for "other" medications but could not give specific med names to NT.  Please advise. Patient is requesting call back or text on #[phone] when she can get medication from pharmacy due to transportation needs.

## 2023-06-06 NOTE — Telephone Encounter (Signed)
 Lvm informing

## 2023-06-06 NOTE — Progress Notes (Signed)
 [facility]  PATIENT: Sandra Riley DOB: Aug 05, 1960  REQUESTING CLINICIAN: Towanda Fret, MD HISTORY FROM: Patient, husband and chart review  REASON FOR VISIT: Seizure disorder follow up    HISTORICAL  CHIEF COMPLAINT:  Chief Complaint  Patient presents with   Follow-up    Rm 13, VNS, reports medication compliance. Reports 2 sz events since last visit, VNS interrogated     INTERVAL HISTORY 06/06/2023:  Patient presents today for follow up. Last visit was in March, at that time, we continue the VNS titration, tolerated the procedure well. She tells me that she has 2 light seizures, last one a last week. Her main complaint at this time is throat pain due to seasonal allergies. She confirms with me that it is not related to her VNS.    INTERVAL HISTORY 04/19/2023:  Patient presents today for follow-up, last visit was in February 4.  Since then she has been doing well, she tells me that she had 2 light seizures, on February 15 and the last 1 on March 3.  With the seizures she feels lightheaded, dizzy and sometimes there is confusion.  She is tolerating the VNS settings well, also on swipe at least once a day.  No other complaints, no other concerns.   INTERVAL HISTORY 03/08/2023:  Patient presents today for follow up. She is accompanied by her husband. Last visit was in OctobeR, at that time we have increase her Phenobarbital  to 200 mg nightly and Keppra  to 2000 mg twice daily. She continued to have breakthrough seizures. She had her VNS implanted on January 16, reports one seizure since implantation of VNS, possibly due to missed medication.   INTERVAL HISTORY 11/18/2022 Patient presents today for follow-up, last visit was in January, since then, she tells me that she had her hip surgery but continues to have breakthrough seizures.  She reports at least 2 seizures during her sleep.  Husband is not aware of the seizures but patient tells me that when she wakes  up in the morning, she feels tired, feels drowsy, weak and has a bad taste in her mouth and that is how she knows she had a seizure.   Husband tells me about once a month she will have episode of behavioral arrest, unresponsiveness, looking like she is in a trance.  She is on phenobarbital  200 mg nightly Monday to Friday and 100 mg nightly Saturday and Sunday and Keppra  1500 mg twice daily but still having breakthrough seizure. Her routine EEG showed left temporal epileptiform discharges.    HISTORY OF PRESENT ILLNESS:  This is a 63 year old woman past medical history of seizure disorder, anxiety, depression, osteoporosis, recent fall with hip fracture who is presenting to establish care.  Patient reports a long history of seizure diagnosed at the age of 63.  Initially she describes her seizures as generalized convulsion with tongue biting.  He was on phenobarbital  and Dilantin .  She reported her seizure was uncontrolled until she initially follow-up with Dr. Joleen Navy who added levetiracetam .  Since being on levetiracetam  and phenobarbital  she has not had any generalized convulsion and this is more than 20 years ago.  Now she is reporting seizure that she described as disorientation and losing track of time and confusion.  She still having those and they have been worse in the past year since her son died\.  Currently she is on phenobarbital  97.2 mg 2 tabs nightly and Keppra  1500 mg twice daily.  While in rehab, she was told  that her phenobarbital  level was elevated therefore it was decreased to 1 tablet on Saturday and Sunday.  She denies any symptoms  associated with phenobarb toxicity and she reports that her last seizure that she described as disorientation and losing track of time was in December 2023 since then she has not had any additional seizures.  She is tolerating the medications very well and denies any side effect. She denies any seizure risk factors, denies any family history of seizures denies  any injury from seizures. Her fall and hip fracture was not related to a seizure.   Handedness: Right handed   Onset: At the age of 1  Seizure Type: Disorientation, losing track of time, last convulsion more than 20 year ago   Current frequency: Last seiure was in Dec 2023  Any injuries from seizures: Tongue biting   Seizure risk factors: Denies   Previous ASMs: Phenobarbital , Levetiracetam , Dilantin ,   Currenty ASMs: Phenobarbital  200 mg nightly, and Levetiracetam  2000 mg twice daily  ASMs side effects: None   Brain Images: None available for review   Previous EEGs: EEG 2013: Normal    OTHER MEDICAL CONDITIONS: Seizure disorder, recent fall and hip fracture, Hypertension, osteoporosis, anxiety/depression   REVIEW OF SYSTEMS: Full 14 system review of systems performed and negative with exception of: As noted in the HPI   ALLERGIES: Allergies  Allergen Reactions   Aspirin     Allergic to non coated ASA Caused gastric ulcers    Nsaids Other (See Comments)    Directed by MD to not take.    Singulair  [Montelukast  Sodium] Other (See Comments)    Became sweaty and nuaseated    HOME MEDICATIONS: Outpatient Medications Prior to Visit  Medication Sig Dispense Refill   acetaminophen  (TYLENOL ) 500 MG tablet Take 1,000 mg by mouth every 6 (six) hours as needed for moderate pain.     alendronate  (FOSAMAX ) 35 MG tablet TAKE 1 TABLET BY MOUTH ONCE WEEKLY IN THE MORNING with full GLASS of water  ON an EMPTY stomach DO not lay down FOR 30 mins 5 tablet 5   amLODipine  (NORVASC ) 2.5 MG tablet TAKE 1 TABLET BY MOUTH DAILY 90 tablet 1   BIOTIN 5000 PO Take 5,000 mcg by mouth in the morning.     Calcium Carb-Cholecalciferol 1000-800 MG-UNIT TABS Take 2 tablets by mouth in the morning.     Camphor-Eucalyptus-Menthol  (VICKS VAPORUB EX) Apply 1 Application topically as needed (congestion.).     Chlorphen-Phenyleph-APAP (ALLERGY MULTI-SYMPTOM) 2-5-325 MG TABS Take 2 tablets by mouth 2 (two)  times daily.     Cholecalciferol (VITAMIN D ) 50 MCG (2000 UT) CAPS Take 1 capsule by mouth in the morning.     clonazePAM  (KLONOPIN ) 1 MG tablet Take 1 tablet (1 mg total) by mouth 2 (two) times daily. 60 tablet 5   clonazePAM  (KLONOPIN ) 1 MG tablet Take 1 tablet (1 mg total) by mouth 2 (two) times daily. 60 tablet 0   ferrous sulfate 325 (65 FE) MG tablet Take 325 mg by mouth in the morning.     Humidifier MISC Large humidifier. 1 each 0   levETIRAcetam  (KEPPRA ) 1000 MG tablet Take 2 tablets (2,000 mg total) by mouth 2 (two) times daily. 360 tablet 3   MAGNESIUM  PO Take 500 mg by mouth in the morning.     metoprolol  tartrate (LOPRESSOR ) 25 MG tablet TAKE 1 TABLET BY MOUTH TWICE DAILY 180 tablet 1   mirtazapine  (REMERON ) 45 MG tablet TAKE 1 TABLET BY MOUTH AT BEDTIME  30 tablet 5   Multiple Vitamin (MULTIVITAMIN) tablet Take 1 tablet by mouth in the morning.     oseltamivir  (TAMIFLU ) 75 MG capsule Take 75 mg by mouth 2 (two) times daily.     PHENObarbital  (LUMINAL) 100 MG tablet Take 2 tablets (200 mg total) by mouth at bedtime. 180 tablet 3   zinc gluconate 50 MG tablet Take 50 mg by mouth in the morning.     ondansetron  (ZOFRAN -ODT) 4 MG disintegrating tablet Take 4 mg by mouth every 6 (six) hours. (Patient not taking: Reported on 06/06/2023)     promethazine -dextromethorphan (PROMETHAZINE -DM) 6.25-15 MG/5ML syrup Take 5 mLs by mouth 4 (four) times daily as needed. (Patient not taking: Reported on 06/06/2023) 118 mL 0   No facility-administered medications prior to visit.    PAST MEDICAL HISTORY: Past Medical History:  Diagnosis Date   Abnormal facial hair 05/11/2011   Anxiety and depression    GAD (generalized anxiety disorder)    Headache    Heart murmur    ECHO 06/15/22: SAM of anterior MV leaflet with dynamic LVOT gradient in setting of moderate symmetrical LVH and hyperdynamic LVF, Peak resting gradient mild at 24 mmHg   Hypertension    Insomnia    Psychotic disorder (HCC)    Seizure  disorder (HCC)    stress related seizures. 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[facility])     PAST SURGICAL HISTORY: Past Surgical History:  Procedure Laterality Date   ABDOMINAL HYSTERECTOMY     BREAST BIOPSY Left 08/24/2022   US  LT BREAST BX W LOC DEV 1ST LESION IMG BX SPEC US  GUIDE 08/24/2022 [facility]-ULTRASOUND   CESAREAN SECTION     CHOLECYSTECTOMY     COLONOSCOPY WITH PROPOFOL  N/A 02/15/2019   Procedure: COLONOSCOPY WITH PROPOFOL ;  Surgeon: Suzette Espy, MD;  Location: [facility];  Service: Endoscopy;  Laterality: N/A;  2:45pm   POLYPECTOMY  02/15/2019   Procedure: POLYPECTOMY;  Surgeon: Suzette Espy, MD;  Location: [facility];  Service: Endoscopy;;   TOTAL HIP ARTHROPLASTY Left 01/27/2022   Procedure: TOTAL HIP ARTHROPLASTY ANTERIOR APPROACH;  Surgeon: Wes Hamman, MD;  Location: [facility] OR;  Service: Orthopedics;  Laterality: Left;   VAGUS NERVE STIMULATOR INSERTION Left 02/17/2023   Procedure: VAGAL NERVE STIMULATOR IMPLANT;  Surgeon: Augusto Blonder, MD;  Location: [facility] OR;  Service: Neurosurgery;  Laterality: Left;  3C    FAMILY HISTORY: Family History  Problem Relation Age of Onset   Breast cancer Mother 87   Coronary artery disease Father    Stroke Father 52   Breast cancer Sister 38   Breast cancer Sister    Breast cancer Sister    Diabetes Brother    Heart attack Brother    Colon cancer Neg Hx     SOCIAL HISTORY: Social History   Socioeconomic History   Marital status: Married    Spouse name: Blaise Bumps   Number of children: 2   Years of education: 12th   Highest education level: 12th grade  Occupational History   Occupation: disabled  Tobacco Use   Smoking status: Never   Smokeless tobacco: Never  Vaping Use   Vaping status: Never Used  Substance and Sexual Activity   Alcohol use: No   Drug use: No   Sexual activity: Yes    Birth control/protection: Surgical  Other Topics Concern   Not on file  Social History Narrative   Right  handed   Caffeine-1 cup daily  Lives with husband      Oldest son passed away in July 08, 2021   Social Drivers of Health   Financial Resource Strain: Low Risk  (07/08/2022)   Overall Financial Resource Strain (CARDIA)    Difficulty of Paying Living Expenses: Not hard at all  Food Insecurity: No Food Insecurity (07/08/2022)   Hunger Vital Sign    Worried About Running Out of Food in the Last Year: Never true    Ran Out of Food in the Last Year: Never true  Transportation Needs: No Transportation Needs (07/08/2022)   PRAPARE - Administrator, Civil Service (Medical): No    Lack of Transportation (Non-Medical): No  Physical Activity: Inactive (07/08/2022)   Exercise Vital Sign    Days of Exercise per Week: 0 days    Minutes of Exercise per Session: 0 min  Stress: No Stress Concern Present (07/08/2022)   [employer] of Occupational Health - Occupational Stress Questionnaire    Feeling of Stress : Not at all  Social Connections: Moderately Isolated (07/08/2022)   Social Connection and Isolation Panel [NHANES]    Frequency of Communication with Friends and Family: Three times a week    Frequency of Social Gatherings with Friends and Family: Twice a week    Attends Religious Services: Never    Database administrator or Organizations: No    Attends Banker Meetings: Never    Marital Status: Married  Catering manager Violence: Not At Risk (07/08/2022)   Humiliation, Afraid, Rape, and Kick questionnaire    Fear of Current or Ex-Partner: No    Emotionally Abused: No    Physically Abused: No    Sexually Abused: No    PHYSICAL EXAM  GENERAL EXAM/CONSTITUTIONAL: Vitals:  Vitals:   06/06/23 1312  BP: 118/66  Weight: 121 lb (54.9 kg)  Height: 5\' 2"  (1.575 m)   Body mass index is 22.13 kg/m. Wt Readings from Last 3 Encounters:  06/06/23 121 lb (54.9 kg)  04/22/23 120 lb 1.9 oz (54.5 kg)  03/08/23 121 lb 14.6 oz (55.3 kg)   Patient is in no distress; well developed,  nourished and groomed; neck is supple  MUSCULOSKELETAL: Gait, strength, tone, movements noted in Neurologic exam below  NEUROLOGIC: MENTAL STATUS:     07/08/2022   11:02 AM  MMSE - Mini Mental State Exam  Not completed: Unable to complete   awake, alert, oriented to person, place and time recent and remote memory intact normal attention and concentration language fluent, comprehension intact, naming intact fund of knowledge appropriate  CRANIAL NERVE:  2nd, 3rd, 4th, 6th - Visual fields full to confrontation, extraocular muscles intact, no nystagmus 5th - facial sensation symmetric 7th - facial strength symmetric 8th - hearing intact 9th - palate elevates symmetrically, uvula midline 11th - shoulder shrug symmetric 12th - tongue protrusion midline  MOTOR:  normal bulk and tone, at least antigravity in the BUE and BLEs  SENSORY:  normal and symmetric to light touch  COORDINATION:  finger-nose-finger, fine finger movements normal  GAIT/STATION:  Using a rollator      DIAGNOSTIC DATA (LABS, IMAGING, TESTING) - I reviewed patient records, labs, notes, testing and imaging myself where available.  Lab Results  Component Value Date   WBC 5.7 02/11/2023   HGB 14.4 02/11/2023   HCT 44.8 02/11/2023   MCV 101.8 (H) 02/11/2023   PLT 425 (H) 02/11/2023      Component Value Date/Time   NA 139 02/11/2023 1319  NA 143 11/18/2022 1333   K 3.4 (L) 02/11/2023 1319   CL 97 (L) 02/11/2023 1319   CO2 29 02/11/2023 1319   GLUCOSE 113 (H) 02/11/2023 1319   BUN <5 (L) 02/11/2023 1319   BUN 4 (L) 11/18/2022 1333   CREATININE 0.60 02/11/2023 1319   CREATININE 0.49 (L) 11/29/2019 0926   CALCIUM 9.8 02/11/2023 1319   PROT 8.0 12/31/2022 1437   PROT 7.5 11/18/2022 1333   ALBUMIN 4.1 12/31/2022 1437   ALBUMIN 4.4 11/18/2022 1333   AST 27 12/31/2022 1437   ALT 24 12/31/2022 1437   ALKPHOS 179 (H) 12/31/2022 1437   BILITOT 0.0 12/31/2022 1437   BILITOT 0.2 11/18/2022 1333    GFRNONAA >60 02/11/2023 1319   GFRNONAA 105 11/29/2019 0926   GFRAA 120 02/27/2020 1508   GFRAA 122 11/29/2019 0926   Lab Results  Component Value Date   CHOL 174 12/09/2021   HDL 64 12/09/2021   LDLCALC 96 12/09/2021   TRIG 72 12/09/2021   Lab Results  Component Value Date   HGBA1C 5.6 02/27/2020   Lab Results  Component Value Date   VITAMINB12 495 12/09/2021   Lab Results  Component Value Date   TSH 1.730 05/11/2022    EEG 2013: Normal   EEG 2024: Left temporal sharps    ASSESSMENT AND PLAN  63 y.o. year old female  with history of longstanding seizure disorder, anxiety/depression, recent fall with left hip fracture s/p surgery who is presenting for follow up.  Her recent EEG showed left temporal discharges. She continues to have breakthrough seizures despite phenobarbital  200 mg nightly and Keppra  2000 mg twice daily. She is now status post VNS implantation on January 16 , 2025.  She is tolerating the schedule VNS titration well. VNS setting updated today, patient tolerated procedure well, and programmed titrated set up in place. I will see her in 3 months for follow up or sooner if worse.  She is comfortable with plans.   1. Localization-related symptomatic epilepsy and epileptic syndromes with complex partial seizures, intractable, without status epilepticus (HCC)   2. S/P placement of VNS (vagus nerve stimulation) device       Patient Instructions  Continue current medications  VNS setting changes today, tolerated procedure well.   I will see her in 3 months for follow up or sooner if worse    Per [place]  DMV statutes, patients with seizures are not allowed to drive until they have been seizure-free for six months.  Other recommendations include using caution when using heavy equipment or power tools. Avoid working on ladders or at heights. Take showers instead of baths.  Do not swim alone.  Ensure the water  temperature is not too high on the home water   heater. Do not go swimming alone. Do not lock yourself in a room alone (i.e. bathroom). When caring for infants or small children, sit down when holding, feeding, or changing them to minimize risk of injury to the child in the event you have a seizure. Maintain good sleep hygiene. Avoid alcohol.  Also recommend adequate sleep, hydration, good diet and minimize stress.   During the Seizure  - First, ensure adequate ventilation and place patients on the floor on their left side  Loosen clothing around the neck and ensure the airway is patent. If the patient is clenching the teeth, do not force the mouth open with any object as this can cause severe damage - Remove all items from the surrounding that can be  hazardous. The patient may be oblivious to what's happening and may not even know what he or she is doing. If the patient is confused and wandering, either gently guide him/her away and block access to outside areas - Reassure the individual and be comforting - Call 911. In most cases, the seizure ends before EMS arrives. However, there are cases when seizures may last over 3 to 5 minutes. Or the individual may have developed breathing difficulties or severe injuries. If a pregnant patient or a person with diabetes develops a seizure, it is prudent to call an ambulance. - Finally, if the patient does not regain full consciousness, then call EMS. Most patients will remain confused for about 45 to 90 minutes after a seizure, so you must use judgment in calling for help. - Avoid restraints but make sure the patient is in a bed with padded side rails - Place the individual in a lateral position with the neck slightly flexed; this will help the saliva drain from the mouth and prevent the tongue from falling backward - Remove all nearby furniture and other hazards from the area - Provide verbal assurance as the individual is regaining consciousness - Provide the patient with privacy if possible - Call for  help and start treatment as ordered by the caregiver   After the Seizure (Postictal Stage)  After a seizure, most patients experience confusion, fatigue, muscle pain and/or a headache. Thus, one should permit the individual to sleep. For the next few days, reassurance is essential. Being calm and helping reorient the person is also of importance.  Most seizures are painless and end spontaneously. Seizures are not harmful to others but can lead to complications such as stress on the lungs, brain and the heart. Individuals with prior lung problems may develop labored breathing and respiratory distress.     Orders Placed This Encounter  Procedures   Vagal Nerve Stimulation    No orders of the defined types were placed in this encounter.   Return in about 3 months (around 09/06/2023).    Cassandra Cleveland, MD 06/06/2023, 2:11 PM  [facility] [address] [place], [place] [address] [phone]

## 2023-06-06 NOTE — Telephone Encounter (Signed)
 Copied from CRM [id number]. Topic: Clinical - Medication Refill >> Jun 06, 2023  7:38 AM Baldemar Lev wrote: Most Recent Primary Care Visit:  Provider: Rosanna Comment  Department: [facility]  Visit Type: ACUTE  Date: 04/22/2023  Medication: clonazePAM  (KLONOPIN ) 1 MG tablet   Pt states that she has LOST her current supply  Has the patient contacted their pharmacy? No (Agent: If no, request that the patient contact the pharmacy for the refill. If patient does not wish to contact the pharmacy document the reason why and proceed with request.) (Agent: If yes, when and what did the pharmacy advise?)  Is this the correct pharmacy for this prescription? Yes If no, delete pharmacy and type the correct one.  This is the patient's preferred pharmacy:   [facility] - [place], [place] - [address] [place] [address] Phone: [phone] Fax: [phone]   Has the prescription been filled recently? Yes  Is the patient out of the medication? Yes  Has the patient been seen for an appointment in the last year OR does the patient have an upcoming appointment? Yes  Can we respond through MyChart? No.   Agent: Please be advised that Rx refills may take up to 3 business days. We ask that you follow-up with your pharmacy.

## 2023-06-06 NOTE — Telephone Encounter (Signed)
 See other note

## 2023-06-06 NOTE — Patient Instructions (Signed)
 Continue current medications  VNS setting changes today, tolerated procedure well.   I will see her in 3 months for follow up or sooner if worse

## 2023-06-06 NOTE — Telephone Encounter (Signed)
 Sandra Riley given Engineer, site

## 2023-06-06 NOTE — Telephone Encounter (Signed)
 One month klonoin  prescribed, needs to keep appt already scheduled with me at month end for additional refills

## 2023-06-06 NOTE — Telephone Encounter (Signed)
  Chief Complaint: "allergy sx" sore throat, ear pain.  Symptoms: sore throat, left ear pain decreased balance at times. Drowsiness, sneezing, cough. Taking mucinex, nasal spray. Using humidifier Frequency: 1 1/2 weeks  Pertinent Negatives: Patient denies fever no chest pain or difficulty breathing reported. No weakness or either side of body related to balance. Disposition: [] ED /[] Urgent Care (no appt availability in office) / [x] Appointment(In office/virtual)/ []  [facility]/ [] Home Care/ [] Refused Recommended Disposition /[] [facility]/ []  Follow-up with PCP Additional Notes:   No available appt with PCP until 06/30/23. Scheduled appt with other provider in office for tomorrow.            Reason for Disposition  Earache also present  Answer Assessment - Initial Assessment Questions 1. ONSET: "When did the throat start hurting?" (Hours or days ago)      1 1/2 weeks  2. SEVERITY: "How bad is the sore throat?" (Scale 1-10; mild, moderate or severe)   - MILD (1-3):  Doesn't interfere with eating or normal activities.   - MODERATE (4-7): Interferes with eating some solids and normal activities.   - SEVERE (8-10):  Excruciating pain, interferes with most normal activities.   - SEVERE WITH DYSPHAGIA (10): Can't swallow liquids, drooling.     Na  3. STREP EXPOSURE: "Has there been any exposure to strep within the past week?" If Yes, ask: "What type of contact occurred?"      na 4.  VIRAL SYMPTOMS: "Are there any symptoms of a cold, such as a runny nose, cough, hoarse voice or red eyes?"      Sore throat pain, left ear pain, decreased balance at times. Cough sneezing, drowsiness.  5. FEVER: "Do you have a fever?" If Yes, ask: "What is your temperature, how was it measured, and when did it start?"     no 6. PUS ON THE TONSILS: "Is there pus on the tonsils in the back of your throat?"     na 7. OTHER SYMPTOMS: "Do you have any other symptoms?" (e.g., difficulty  breathing, headache, rash)     Unbalanced at times. Taking mucinex and nasal spray  8. PREGNANCY: "Is there any chance you are pregnant?" "When was your last menstrual period?"     na  Protocols used: Sore Throat-A-AH

## 2023-06-06 NOTE — Telephone Encounter (Signed)
 Patient called after call hours left a message patient spilled her liquid medication and the doctor at the ER is not able to give a full refill.  Patient said she will go into withdrawal need medicine refilled.  Called the patient left a voicemail to reach our office need to know name of this medication.

## 2023-06-06 NOTE — Telephone Encounter (Signed)
 Noted. Pt seeing Rice Chamorro tomorrow

## 2023-06-06 NOTE — Telephone Encounter (Signed)
 Copied from CRM [id number]. Topic: Clinical - Prescription Issue >> Jun 06, 2023 10:48 AM Lizabeth Riggs wrote: Odilia Bennett from [facility] is calling back to speak with a nurse. Odilia Bennett needs Dr. Doylene Genet nurse to call and give her a verbal okay to refill clonazePAM  (KLONOPIN ) 1 MG tablet.  Please call Odilia Bennett at [phone]. Thanks

## 2023-06-06 NOTE — Telephone Encounter (Signed)
 Copied from CRM [id number]. Topic: Clinical - Prescription Issue >> Jun 06, 2023  9:02 AM Everlene Hobby D wrote: Patient called to provide office with the name of the prescription that she spilled- clonazePAM  (KLONOPIN ) 1 MG tablet  Patient said to send her a text when it's ready for pickup   [facility], [place] - [address] [place] [address] Phone: [phone] Fax: [phone] Hours: Not open 24 hours

## 2023-06-07 ENCOUNTER — Ambulatory Visit: Payer: Self-pay

## 2023-06-14 DIAGNOSIS — B349 Viral infection, unspecified: Secondary | ICD-10-CM | POA: Diagnosis not present

## 2023-06-22 ENCOUNTER — Encounter: Payer: Self-pay | Admitting: Internal Medicine

## 2023-06-22 ENCOUNTER — Ambulatory Visit (INDEPENDENT_AMBULATORY_CARE_PROVIDER_SITE_OTHER): Admitting: Internal Medicine

## 2023-06-22 VITALS — BP 138/84 | HR 71 | Ht 62.0 in | Wt 117.8 lb

## 2023-06-22 DIAGNOSIS — J0110 Acute frontal sinusitis, unspecified: Secondary | ICD-10-CM | POA: Diagnosis not present

## 2023-06-22 MED ORDER — AMOXICILLIN-POT CLAVULANATE 875-125 MG PO TABS: 1.0000 | ORAL_TABLET | Freq: Two times a day (BID) | ORAL | 0 refills | Status: DC

## 2023-06-22 NOTE — Assessment & Plan Note (Signed)
 Nasal congestion with postnasal drip, with normal ear exam - likely acute sinusitis Started empiric Augmentin as she has persistent symptoms despite symptomatic treatment Advised to use Flonase  for nasal congestion Phenol throat spray as needed for sore throat, advised to perform warm water  gargling for sore throat Maintain adequate hydration

## 2023-06-22 NOTE — Progress Notes (Signed)
 Acute Office Visit  Subjective:    Patient ID: Sandra Riley, female    DOB: 1960/03/05, 63 y.o.   MRN: [id number]  Chief Complaint  Patient presents with   Ear Pain    Ongoing x 1 week.   Sore Throat    Ongoing x 1 week    HPI Patient is in today for complaint of nasal congestion, postnasal drip, sore throat, left-sided ear pain and fever for the last 1 week.  Her fever has resolved now.  She went to urgent care in the last week, was given Zofran  as needed for nausea and was told to continue symptomatic treatment.  She denies dyspnea or wheezing currently.  Past Medical History:  Diagnosis Date   Abnormal facial hair 05/11/2011   Anxiety and depression    GAD (generalized anxiety disorder)    Headache    Heart murmur    ECHO 06/15/22: SAM of anterior MV leaflet with dynamic LVOT gradient in setting of moderate symmetrical LVH and hyperdynamic LVF, Peak resting gradient mild at 24 mmHg   Hypertension    Insomnia    Psychotic disorder (HCC)    Seizure disorder (HCC)    stress related seizures. 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[facility])     Past Surgical History:  Procedure Laterality Date   ABDOMINAL HYSTERECTOMY     BREAST BIOPSY Left 08/24/2022   US  LT BREAST BX W LOC DEV 1ST LESION IMG BX SPEC US  GUIDE 08/24/2022 [facility]-ULTRASOUND   CESAREAN SECTION     CHOLECYSTECTOMY     COLONOSCOPY WITH PROPOFOL  N/A 02/15/2019   Procedure: COLONOSCOPY WITH PROPOFOL ;  Surgeon: Suzette Espy, MD;  Location: [facility] SU[facility];  Service: Endoscopy;  Laterality: N/A;  2:45pm   POLYPECTOMY  02/15/2019   Procedure: POLYPECTOMY;  Surgeon: Suzette Espy, MD;  Location: [facility];  Service: Endoscopy;;   TOTAL HIP ARTHROPLASTY Left 01/27/2022   Procedure: TOTAL HIP ARTHROPLASTY ANTERIOR APPROACH;  Surgeon: Wes Hamman, MD;  Location: [facility] OR;  Service: Orthopedics;  Laterality: Left;   VAGUS NERVE STIMULATOR INSERTION Left 02/17/2023   Procedure: VAGAL NERVE STIMULATOR  IMPLANT;  Surgeon: Augusto Blonder, MD;  Location: [facility] OR;  Service: Neurosurgery;  Laterality: Left;  3C    Family History  Problem Relation Age of Onset   Breast cancer Mother 45   Coronary artery disease Father    Stroke Father 52   Breast cancer Sister 74   Breast cancer Sister    Breast cancer Sister    Diabetes Brother    Heart attack Brother    Colon cancer Neg Hx     Social History   Socioeconomic History   Marital status: Married    Spouse name: Blaise Bumps   Number of children: 2   Years of education: 12th   Highest education level: 12th grade  Occupational History   Occupation: disabled  Tobacco Use   Smoking status: Never   Smokeless tobacco: Never  Vaping Use   Vaping status: Never Used  Substance and Sexual Activity   Alcohol use: No   Drug use: No   Sexual activity: Yes    Birth control/protection: Surgical  Other Topics Concern   Not on file  Social History Narrative   Right handed   Caffeine-1 cup daily   Lives with husband      Oldest son passed away in 2021/07/22   Social Drivers of Health   Financial Resource Strain: Low  Risk  (07/08/2022)   Overall Financial Resource Strain (CARDIA)    Difficulty of Paying Living Expenses: Not hard at all  Food Insecurity: No Food Insecurity (07/08/2022)   Hunger Vital Sign    Worried About Running Out of Food in the Last Year: Never true    Ran Out of Food in the Last Year: Never true  Transportation Needs: No Transportation Needs (07/08/2022)   PRAPARE - Administrator, Civil Service (Medical): No    Lack of Transportation (Non-Medical): No  Physical Activity: Inactive (07/08/2022)   Exercise Vital Sign    Days of Exercise per Week: 0 days    Minutes of Exercise per Session: 0 min  Stress: No Stress Concern Present (07/08/2022)   [employer] of Occupational Health - Occupational Stress Questionnaire    Feeling of Stress : Not at all  Social Connections: Moderately Isolated (07/08/2022)   Social  Connection and Isolation Panel [NHANES]    Frequency of Communication with Friends and Family: Three times a week    Frequency of Social Gatherings with Friends and Family: Twice a week    Attends Religious Services: Never    Database administrator or Organizations: No    Attends Banker Meetings: Never    Marital Status: Married  Catering manager Violence: Not At Risk (07/08/2022)   Humiliation, Afraid, Rape, and Kick questionnaire    Fear of Current or Ex-Partner: No    Emotionally Abused: No    Physically Abused: No    Sexually Abused: No    Outpatient Medications Prior to Visit  Medication Sig Dispense Refill   acetaminophen  (TYLENOL ) 500 MG tablet Take 1,000 mg by mouth every 6 (six) hours as needed for moderate pain.     alendronate  (FOSAMAX ) 35 MG tablet TAKE 1 TABLET BY MOUTH ONCE WEEKLY IN THE MORNING with full GLASS of water  ON an EMPTY stomach DO not lay down FOR 30 mins 5 tablet 5   amLODipine  (NORVASC ) 2.5 MG tablet TAKE 1 TABLET BY MOUTH DAILY 90 tablet 1   BIOTIN 5000 PO Take 5,000 mcg by mouth in the morning.     Calcium Carb-Cholecalciferol 1000-800 MG-UNIT TABS Take 2 tablets by mouth in the morning.     Camphor-Eucalyptus-Menthol  (VICKS VAPORUB EX) Apply 1 Application topically as needed (congestion.).     Chlorphen-Phenyleph-APAP (ALLERGY MULTI-SYMPTOM) 2-5-325 MG TABS Take 2 tablets by mouth 2 (two) times daily.     Cholecalciferol (VITAMIN D ) 50 MCG (2000 UT) CAPS Take 1 capsule by mouth in the morning.     clonazePAM  (KLONOPIN ) 1 MG tablet Take 1 tablet (1 mg total) by mouth 2 (two) times daily. 60 tablet 5   clonazePAM  (KLONOPIN ) 1 MG tablet Take 1 tablet (1 mg total) by mouth 2 (two) times daily. 60 tablet 0   ferrous sulfate 325 (65 FE) MG tablet Take 325 mg by mouth in the morning.     Humidifier MISC Large humidifier. 1 each 0   levETIRAcetam  (KEPPRA ) 1000 MG tablet Take 2 tablets (2,000 mg total) by mouth 2 (two) times daily. 360 tablet 3    MAGNESIUM  PO Take 500 mg by mouth in the morning.     metoprolol  tartrate (LOPRESSOR ) 25 MG tablet TAKE 1 TABLET BY MOUTH TWICE DAILY 180 tablet 1   mirtazapine  (REMERON ) 45 MG tablet TAKE 1 TABLET BY MOUTH AT BEDTIME 30 tablet 5   Multiple Vitamin (MULTIVITAMIN) tablet Take 1 tablet by mouth in the morning.  ondansetron  (ZOFRAN -ODT) 4 MG disintegrating tablet Take 4 mg by mouth every 6 (six) hours.     PHENObarbital  (LUMINAL) 100 MG tablet Take 2 tablets (200 mg total) by mouth at bedtime. 180 tablet 3   promethazine -dextromethorphan (PROMETHAZINE -DM) 6.25-15 MG/5ML syrup Take 5 mLs by mouth 4 (four) times daily as needed. 118 mL 0   zinc gluconate 50 MG tablet Take 50 mg by mouth in the morning.     oseltamivir  (TAMIFLU ) 75 MG capsule Take 75 mg by mouth 2 (two) times daily.     No facility-administered medications prior to visit.    Allergies  Allergen Reactions   Aspirin     Allergic to non coated ASA Caused gastric ulcers    Nsaids Other (See Comments)    Directed by MD to not take.    Singulair  [Montelukast  Sodium] Other (See Comments)    Became sweaty and nuaseated    Review of Systems  Constitutional:  Positive for fatigue. Negative for chills.  HENT:  Positive for congestion, postnasal drip, sinus pressure, sinus pain and sore throat.   Eyes:  Negative for pain and discharge.  Respiratory:  Negative for cough and shortness of breath.   Cardiovascular:  Negative for chest pain and palpitations.  Gastrointestinal:  Negative for abdominal pain, diarrhea, nausea and vomiting.  Endocrine: Negative for polydipsia and polyuria.  Genitourinary:  Negative for dysuria and hematuria.  Musculoskeletal:  Negative for neck pain and neck stiffness.  Skin:  Negative for rash.  Neurological:  Negative for dizziness and weakness.  Psychiatric/Behavioral:  Negative for agitation and behavioral problems.        Objective:     Physical Exam Vitals reviewed.  Constitutional:       General: She is not in acute distress.    Appearance: She is not diaphoretic.  HENT:     Head: Normocephalic and atraumatic.     Right Ear: Ear canal normal. There is no impacted cerumen.     Left Ear: Ear canal normal. There is no impacted cerumen.     Nose: No congestion.     Right Sinus: Frontal sinus tenderness present.     Left Sinus: Frontal sinus tenderness present.     Mouth/Throat:     Mouth: Mucous membranes are moist.     Pharynx: Posterior oropharyngeal erythema present.  Eyes:     General: No scleral icterus.    Extraocular Movements: Extraocular movements intact.  Cardiovascular:     Rate and Rhythm: Normal rate and regular rhythm.     Heart sounds: Normal heart sounds. No murmur heard. Pulmonary:     Breath sounds: Normal breath sounds. No wheezing or rales.  Musculoskeletal:     Cervical back: Neck supple. No tenderness.     Right lower leg: No edema.     Left lower leg: No edema.  Skin:    General: Skin is warm.     Findings: No rash.  Neurological:     General: No focal deficit present.     Mental Status: She is alert and oriented to person, place, and time.     Gait: Gait abnormal.  Psychiatric:        Mood and Affect: Mood normal.        Behavior: Behavior normal.     BP 138/84 (BP Location: Left Arm)   Pulse 71   Ht 5\' 2"  (1.575 m)   Wt 117 lb 12.8 oz (53.4 kg)   SpO2 98%   BMI 21.55  kg/m  Wt Readings from Last 3 Encounters:  06/22/23 117 lb 12.8 oz (53.4 kg)  06/06/23 121 lb (54.9 kg)  04/22/23 120 lb 1.9 oz (54.5 kg)        Assessment & Plan:   Problem List Items Addressed This Visit       Respiratory   Acute non-recurrent frontal sinusitis - Primary   Nasal congestion with postnasal drip, with normal ear exam - likely acute sinusitis Started empiric Augmentin as she has persistent symptoms despite symptomatic treatment Advised to use Flonase  for nasal congestion Phenol throat spray as needed for sore throat, advised to perform warm  water  gargling for sore throat Maintain adequate hydration      Relevant Medications   amoxicillin-clavulanate (AUGMENTIN) 875-125 MG tablet     Meds ordered this encounter  Medications   amoxicillin-clavulanate (AUGMENTIN) 875-125 MG tablet    Sig: Take 1 tablet by mouth 2 (two) times daily.    Dispense:  14 tablet    Refill:  0     Navada Osterhout Alyssa Backbone, MD

## 2023-06-22 NOTE — Patient Instructions (Addendum)
 Please start taking Augmentin as prescribed.  Please use Flonase  for nasal congestion.  Please use Phenol throat spray for sore throat. Please perform warm water  gargling at least 4 times in a day for sore throat.

## 2023-06-30 ENCOUNTER — Ambulatory Visit (INDEPENDENT_AMBULATORY_CARE_PROVIDER_SITE_OTHER): Admitting: Family Medicine

## 2023-06-30 ENCOUNTER — Encounter: Payer: Self-pay | Admitting: Family Medicine

## 2023-06-30 VITALS — BP 154/92 | HR 77 | Resp 16 | Ht 62.0 in | Wt 110.1 lb

## 2023-06-30 DIAGNOSIS — I10 Essential (primary) hypertension: Secondary | ICD-10-CM

## 2023-06-30 DIAGNOSIS — Z1231 Encounter for screening mammogram for malignant neoplasm of breast: Secondary | ICD-10-CM | POA: Insufficient documentation

## 2023-06-30 DIAGNOSIS — G40309 Generalized idiopathic epilepsy and epileptic syndromes, not intractable, without status epilepticus: Secondary | ICD-10-CM

## 2023-06-30 DIAGNOSIS — R110 Nausea: Secondary | ICD-10-CM | POA: Diagnosis not present

## 2023-06-30 DIAGNOSIS — J0100 Acute maxillary sinusitis, unspecified: Secondary | ICD-10-CM | POA: Insufficient documentation

## 2023-06-30 DIAGNOSIS — F322 Major depressive disorder, single episode, severe without psychotic features: Secondary | ICD-10-CM

## 2023-06-30 DIAGNOSIS — M810 Age-related osteoporosis without current pathological fracture: Secondary | ICD-10-CM

## 2023-06-30 DIAGNOSIS — J320 Chronic maxillary sinusitis: Secondary | ICD-10-CM | POA: Insufficient documentation

## 2023-06-30 MED ORDER — ONDANSETRON HCL 4 MG/2ML IJ SOLN
4.0000 mg | Freq: Once | INTRAMUSCULAR | Status: AC
  Administered 2023-06-30: 4 mg via INTRAMUSCULAR

## 2023-06-30 MED ORDER — SPIRONOLACTONE 25 MG PO TABS: 25.0000 mg | ORAL_TABLET | Freq: Every day | ORAL | 3 refills | Status: DC

## 2023-06-30 MED ORDER — CLONAZEPAM 1 MG PO TABS: 1.0000 mg | ORAL_TABLET | Freq: Two times a day (BID) | ORAL | 5 refills | Status: DC

## 2023-06-30 MED ORDER — AMOXICILLIN-POT CLAVULANATE 875-125 MG PO TABS: 1.0000 | ORAL_TABLET | Freq: Two times a day (BID) | ORAL | 0 refills | Status: DC

## 2023-06-30 NOTE — Assessment & Plan Note (Signed)
 Controlled, no change in medication

## 2023-06-30 NOTE — Assessment & Plan Note (Signed)
 Add spironolactone  25 mg daily DASH diet and commitment to daily physical activity for a minimum of 30 minutes discussed and encouraged, as a part of hypertension management. The importance of attaining a healthy weight is also discussed.     06/30/2023    1:38 PM 06/22/2023    2:07 PM 06/22/2023    1:55 PM 06/22/2023    1:45 PM 06/06/2023    1:12 PM 04/22/2023    2:16 PM 04/22/2023    2:15 PM  BP/Weight  Systolic BP 154 138 149 144 118 158 172  Diastolic BP 92 84 83 76 66 94 94  Wt. (Lbs) 110.12   117.8 121  120.12  BMI 20.14 kg/m2   21.55 kg/m2 22.13 kg/m2  21.97 kg/m2

## 2023-06-30 NOTE — Assessment & Plan Note (Signed)
 Uncontrolled zofran  4 mg IM in office

## 2023-06-30 NOTE — Addendum Note (Signed)
 Addended by: Unice Gant on: 06/30/2023 03:19 PM   Modules accepted: Orders

## 2023-06-30 NOTE — Progress Notes (Signed)
   Sandra Riley     MRN: [id number]      DOB: Jan 17, 1961  Chief Complaint  Patient presents with   Hypertension    Follow up    Cough    Complains of cough, sinus congestion/drainage, and sore throat for last couple weeks. Was seen last week and prescribed abx. Finished abx but still having sx's     HPI Sandra Riley is here complaining of bilateral sinus pressure left greater than right with brown drainage from her nostrils and tender swollen glands in the neck.  She also reports throat feels sore like a knife is cutting it.  She also states that her symptoms have improved since starting the Augmentin but still persist.  She reports chills and does not report any fever.  Patient also complains of significant nausea and is requesting medication for this.  She denies diarrhea.  She still reports having breakthrough seizures   ROS  Denies chest congestion, she does have a  cough she denies wheezing wheezing. Denies chest pains, palpitations and leg swelling Denies ,diarrhea or constipation.   Denies dysuria, frequency, hesitancy or incontinence. Denies joint pain, swelling and limitation in mobility. Denies depression, anxiety or insomnia. Denies skin break down or rash.   PE  BP (!) 154/92   Pulse 77   Resp 16   Ht 5\' 2"  (1.575 m)   Wt 110 lb 1.9 oz (50 kg)   SpO2 93%   BMI 20.14 kg/m   Patient alert and oriented   HEENT: No facial asymmetry, EOMI,     Neck supple .bilateral cervical adenitis, right maxillary tenderness  Chest: Clear to auscultation bilaterally.  CVS: S1, S2 no murmurs, no S3.Regular rate.  ABD: Soft non tender.   Ext: No edema  MS: Adequate thoughj reduced  ROM spine, shoulders, hips and knees.  Skin: Intact, no ulcerations or rash noted.  Psych: Good eye contact, normal affect. Memory intact not anxious or depressed appearing.  CNS: CN 2-12 intact, power,  normal throughout.no focal deficits noted.   Assessment & Plan  Maxillary  sinusitis Additionakl 5 days augmentin, need to submit specimen if symtoms persist  Essential hypertension Add spironolactone  25 mg daily DASH diet and commitment to daily physical activity for a minimum of 30 minutes discussed and encouraged, as a part of hypertension management. The importance of attaining a healthy weight is also discussed.     06/30/2023    1:38 PM 06/22/2023    2:07 PM 06/22/2023    1:55 PM 06/22/2023    1:45 PM 06/06/2023    1:12 PM 04/22/2023    2:16 PM 04/22/2023    2:15 PM  BP/Weight  Systolic BP 154 138 149 144 118 158 172  Diastolic BP 92 84 83 76 66 94 94  Wt. (Lbs) 110.12   117.8 121  120.12  BMI 20.14 kg/m2   21.55 kg/m2 22.13 kg/m2  21.97 kg/m2       Nausea Uncontrolled zofran  4 mg IM in office  Depression, major, single episode, severe (HCC) Controlled, no change in medication   Generalized convulsive epilepsy (HCC) Improved control and managed by neurology  Osteoporosis without current pathological fracture Continue weekly fosamax

## 2023-06-30 NOTE — Assessment & Plan Note (Signed)
 Additionakl 5 days augmentin , need to submit specimen if symtoms persist

## 2023-06-30 NOTE — Patient Instructions (Signed)
 Follow-up in 6 weeks to review.  Blood pressure.  Call if you need me sooner.  5 additional days of Augmentin is prescribed for your sinus infection.   If symptoms persist please submit specimen ffor testing, nurse pls provide order and cup for same or have her call in if needs to be sent  Zofran  4 mg IM in the office for nausea and you already have medication for nausea at home.  Please schedule mammogram at checkout, nurse please order same.  Fasting lipid CMP and EGFR to be obtained 3 to 5 days before your next visit.  New additional medicine spironolactone  25 mg daily is added as your blood pressure is uncontrolled and you have been on this in the past.  Klonopin  can be filled on June 4 and a prescription has been sent in. Thanks for choosing [facility], we consider it a privelige to serve you.

## 2023-06-30 NOTE — Assessment & Plan Note (Signed)
Continue weekly fosamax 

## 2023-06-30 NOTE — Assessment & Plan Note (Signed)
 Improved control and managed by neurology

## 2023-07-05 ENCOUNTER — Telehealth: Payer: Self-pay

## 2023-07-05 NOTE — Telephone Encounter (Signed)
 Copied from CRM [id number]. Topic: Clinical - Medication Question >> Jul 05, 2023  1:18 PM Antwanette L wrote: Reason for CRM: The patient saw Dr. Rodolph Clap on 5/29 for a follow up. The patient was prescribed amoxicillin -clavulanate (AUGMENTIN ) 875-125 MG but it doesn't work at night. The patient said when she takes the amoxicillin -clavulante with mucinex it works at night. The patient is requesting a callback at [phone].

## 2023-07-07 ENCOUNTER — Other Ambulatory Visit: Payer: Self-pay

## 2023-07-07 DIAGNOSIS — J0100 Acute maxillary sinusitis, unspecified: Secondary | ICD-10-CM

## 2023-07-07 NOTE — Telephone Encounter (Signed)
 Pt informed. Ordered. Pt coming tmr

## 2023-07-07 NOTE — Telephone Encounter (Signed)
 Lvm to cb

## 2023-07-12 ENCOUNTER — Ambulatory Visit

## 2023-07-13 ENCOUNTER — Ambulatory Visit: Payer: Self-pay

## 2023-07-14 ENCOUNTER — Ambulatory Visit: Admitting: Internal Medicine

## 2023-07-14 ENCOUNTER — Encounter: Payer: Self-pay | Admitting: Internal Medicine

## 2023-07-14 VITALS — BP 144/80 | HR 75 | Ht 62.0 in | Wt 114.4 lb

## 2023-07-14 DIAGNOSIS — H8110 Benign paroxysmal vertigo, unspecified ear: Secondary | ICD-10-CM | POA: Diagnosis not present

## 2023-07-14 DIAGNOSIS — I10 Essential (primary) hypertension: Secondary | ICD-10-CM | POA: Diagnosis not present

## 2023-07-14 DIAGNOSIS — J0100 Acute maxillary sinusitis, unspecified: Secondary | ICD-10-CM

## 2023-07-14 DIAGNOSIS — R42 Dizziness and giddiness: Secondary | ICD-10-CM | POA: Insufficient documentation

## 2023-07-14 DIAGNOSIS — R0981 Nasal congestion: Secondary | ICD-10-CM

## 2023-07-14 MED ORDER — MECLIZINE HCL 25 MG PO TABS: 25.0000 mg | ORAL_TABLET | Freq: Three times a day (TID) | ORAL | 2 refills | Status: DC | PRN

## 2023-07-14 MED ORDER — FLUTICASONE PROPIONATE 50 MCG/ACT NA SUSP
2.0000 | Freq: Every day | NASAL | 6 refills | Status: DC
Start: 2023-07-14 — End: 2023-09-22

## 2023-07-14 NOTE — Assessment & Plan Note (Signed)
 Presenting today for an acute visit endorsing persistent sinus congestion.  She has recently completed multiple rounds of Augmentin  for treatment of bacterial sinusitis.  Most recently, a sputum culture has been ordered.  We reviewed that it can take a prolonged period of time for symptoms to completely resolve despite completing antibiotics.  Will obtain culture today.  Recommend continued supportive care measures.  Fluticasone  nasal spray has been prescribed as recommended at previous appointments.

## 2023-07-14 NOTE — Progress Notes (Signed)
 Acute Office Visit  Subjective:     Patient ID: Sandra Riley, female    DOB: 1960-06-21, 63 y.o.   MRN: [id number]  Chief Complaint  Patient presents with   URI    Sore throat, runny nose, headache , off balance, ear ache    Sandra Riley presents today for an acute visit endorsing persistent sinus congestion and recent development of dizziness with a fall 2 days ago.  She has recent been treated for acute maxillary sinusitis with multiple rounds of Augmentin .  She continues to endorse sinus congestion and is currently taking over-the-counter allergy medications for symptom relief.  She reports a fall and hitting her head early Tuesday morning (6/10).  She describes dizziness preceding the fall.  She hit the left side of her head and reports loss of consciousness.  Her husband helped her stand up when she regained consciousness.  She has felt dizzy multiple times since this fall but denies any additional falls.  No prior history of vertigo.  She describes dizziness as being preceded by abrupt head movements.  Not associated with sudden changes in position.  Review of Systems  HENT:  Positive for congestion and sinus pain.   Neurological:  Positive for dizziness and loss of consciousness.      Objective:    BP (!) 144/80   Pulse 75   Ht 5' 2 (1.575 m)   Wt 114 lb 6.4 oz (51.9 kg)   SpO2 98%   BMI 20.92 kg/m   Physical Exam Constitutional:      General: She is not in acute distress.    Appearance: Normal appearance. She is not toxic-appearing.  HENT:     Head: Normocephalic and atraumatic.     Right Ear: External ear normal.     Left Ear: External ear normal.     Nose: Congestion and rhinorrhea present.     Mouth/Throat:     Mouth: Mucous membranes are moist.     Pharynx: Oropharynx is clear. No oropharyngeal exudate or posterior oropharyngeal erythema.   Eyes:     General: No scleral icterus.    Extraocular Movements: Extraocular movements intact.      Conjunctiva/sclera: Conjunctivae normal.     Pupils: Pupils are equal, round, and reactive to light.    Cardiovascular:     Rate and Rhythm: Normal rate and regular rhythm.     Pulses: Normal pulses.     Heart sounds: Normal heart sounds. No murmur heard.    No friction rub. No gallop.  Pulmonary:     Effort: Pulmonary effort is normal.     Breath sounds: Normal breath sounds. No wheezing, rhonchi or rales.  Abdominal:     General: Abdomen is flat. Bowel sounds are normal. There is no distension.     Palpations: Abdomen is soft.     Tenderness: There is no abdominal tenderness.   Musculoskeletal:        General: No swelling. Normal range of motion.     Cervical back: Normal range of motion.     Right lower leg: No edema.     Left lower leg: No edema.  Lymphadenopathy:     Cervical: No cervical adenopathy.   Skin:    General: Skin is warm and dry.     Capillary Refill: Capillary refill takes less than 2 seconds.     Coloration: Skin is not jaundiced.     Comments: Ecchymoses noted in right temporal region   Neurological:  General: No focal deficit present.     Mental Status: She is alert and oriented to person, place, and time.     Gait: Gait abnormal (Ambulates with a rollator).   Psychiatric:        Mood and Affect: Mood normal.        Behavior: Behavior normal.       Assessment & Plan:   Problem List Items Addressed This Visit       Maxillary sinusitis   Presenting today for an acute visit endorsing persistent sinus congestion.  She has recently completed multiple rounds of Augmentin  for treatment of bacterial sinusitis.  Most recently, a sputum culture has been ordered.  We reviewed that it can take a prolonged period of time for symptoms to completely resolve despite completing antibiotics.  Will obtain culture today.  Recommend continued supportive care measures.  Fluticasone  nasal spray has been prescribed as recommended at previous appointments.       Dizzinesses   Her additional concern today is dizziness with recent fall and resulting LOC as described above.  On exam she has ecchymoses noted in the right temporal region.  Dizziness occurs with ambulation and abrupt head movements.  We discussed obtaining CT head in the setting of recent fall with loss of consciousness but patient declines, noting that she has felt well over the last 2 days.  I suspect that her dizziness may be due to vertigo.  Will trial meclizine for symptom relief.  She will return to care if symptoms worsen or fail to improve.      Meds ordered this encounter  Medications   meclizine (ANTIVERT) 25 MG tablet    Sig: Take 1 tablet (25 mg total) by mouth 3 (three) times daily as needed for dizziness.    Dispense:  30 tablet    Refill:  2   fluticasone  (FLONASE ) 50 MCG/ACT nasal spray    Sig: Place 2 sprays into both nostrils daily.    Dispense:  16 g    Refill:  6    Return if symptoms worsen or fail to improve.  Tobi Fortes, MD

## 2023-07-14 NOTE — Patient Instructions (Signed)
 It was a pleasure to see you today.  Thank you for giving us  the opportunity to be involved in your care.  Below is a brief recap of your visit and next steps.  We will plan to see you again in 7/10.  Summary Try meclizine for dizziness relief Flonase  prescription sent today Complete sputum culture Follow up with Dr. Rodolph Clap 7/10

## 2023-07-14 NOTE — Assessment & Plan Note (Signed)
 Her additional concern today is dizziness with recent fall and resulting LOC as described above.  On exam she has ecchymoses noted in the right temporal region.  Dizziness occurs with ambulation and abrupt head movements.  We discussed obtaining CT head in the setting of recent fall with loss of consciousness but patient declines, noting that she has felt well over the last 2 days.  I suspect that her dizziness may be due to vertigo.  Will trial meclizine for symptom relief.  She will return to care if symptoms worsen or fail to improve.

## 2023-07-15 ENCOUNTER — Telehealth: Payer: Self-pay

## 2023-07-15 ENCOUNTER — Ambulatory Visit: Payer: Self-pay | Admitting: Family Medicine

## 2023-07-15 MED ORDER — POTASSIUM CHLORIDE CRYS ER 10 MEQ PO TBCR
10.0000 meq | EXTENDED_RELEASE_TABLET | Freq: Two times a day (BID) | ORAL | 3 refills | Status: DC
Start: 2023-07-15 — End: 2023-08-11

## 2023-07-15 NOTE — Telephone Encounter (Signed)
Will call when provider reviews.

## 2023-07-15 NOTE — Telephone Encounter (Signed)
 Copied from CRM [id number]. Topic: Clinical - Lab/Test Results >> Jul 15, 2023  9:17 AM Crispin Dolphin wrote: Reason for CRM: Patient called. Received a message about results. Let her know complete but not yet interpreted or noted by provider. Once they have been reviewed pt would like a call. She is having trouble accessing MyChart. Sent link to reset pw. Thank You

## 2023-07-18 DIAGNOSIS — J0100 Acute maxillary sinusitis, unspecified: Secondary | ICD-10-CM | POA: Diagnosis not present

## 2023-07-19 ENCOUNTER — Other Ambulatory Visit: Payer: Self-pay | Admitting: Internal Medicine

## 2023-07-20 ENCOUNTER — Ambulatory Visit: Payer: Self-pay | Admitting: Family Medicine

## 2023-07-20 LAB — GRAM STAIN W/SPUTUM CULT RFLX: White Blood Cells: NONE SEEN

## 2023-07-22 ENCOUNTER — Ambulatory Visit: Payer: Self-pay

## 2023-07-22 DIAGNOSIS — J029 Acute pharyngitis, unspecified: Secondary | ICD-10-CM | POA: Diagnosis not present

## 2023-07-22 DIAGNOSIS — Z681 Body mass index (BMI) 19 or less, adult: Secondary | ICD-10-CM | POA: Diagnosis not present

## 2023-07-22 DIAGNOSIS — R0981 Nasal congestion: Secondary | ICD-10-CM | POA: Diagnosis not present

## 2023-07-22 DIAGNOSIS — I10 Essential (primary) hypertension: Secondary | ICD-10-CM | POA: Diagnosis not present

## 2023-07-22 NOTE — Telephone Encounter (Signed)
 FYI Only or Action Required?: Action required by provider: update on patient condition.  Patient was last seen in primary care on 07/14/2023 by Tobi Fortes, MD. Called Nurse Triage reporting Sore Throat. Symptoms began several weeks ago. Interventions attempted: OTC medications: Tylenol . Symptoms are: gradually worsening.  Triage Disposition: See Physician Within 24 Hours  Patient/caregiver understands and will follow disposition?: Yes  Asking to be worked in today, may go to UC.      Copied from CRM [id number]. Topic: Clinical - Red Word Triage >> Jul 22, 2023  9:13 AM Hamp Levine R wrote: [facility] that prompted transfer to Nurse Triage: Patient states she has not been feeling well for the last two weeks and has pain in her throat and ear. States she gets dizzy and her balance feels off. Reason for Disposition  Earache also present  Answer Assessment - Initial Assessment Questions 1. ONSET: When did the throat start hurting? (Hours or days ago)      2 weeks 2. SEVERITY: How bad is the sore throat? (Scale 1-10; mild, moderate or severe)   - MILD (1-3):  Doesn't interfere with eating or normal activities.   - MODERATE (4-7): Interferes with eating some solids and normal activities.   - SEVERE (8-10):  Excruciating pain, interferes with most normal activities.   - SEVERE WITH DYSPHAGIA (10): Can't swallow liquids, drooling.     Moderate 3. STREP EXPOSURE: Has there been any exposure to strep within the past week? If Yes, ask: What type of contact occurred?      no 4.  VIRAL SYMPTOMS: Are there any symptoms of a cold, such as a runny nose, cough, hoarse voice or red eyes?      Ear pain 5. FEVER: Do you have a fever? If Yes, ask: What is your temperature, how was it measured, and when did it start?     no 6. PUS ON THE TONSILS: Is there pus on the tonsils in the back of your throat?     no 7. OTHER SYMPTOMS: Do you have any other symptoms? (e.g., difficulty breathing,  headache, rash)    Headache 8. PREGNANCY: Is there any chance you are pregnant? When was your last menstrual period?     no  Protocols used: Sore Throat-A-AH

## 2023-07-22 NOTE — Telephone Encounter (Signed)
 No availability in office. Advised UC

## 2023-07-26 ENCOUNTER — Encounter: Payer: Self-pay | Admitting: Internal Medicine

## 2023-07-26 ENCOUNTER — Ambulatory Visit (INDEPENDENT_AMBULATORY_CARE_PROVIDER_SITE_OTHER): Payer: Self-pay | Admitting: Internal Medicine

## 2023-07-26 VITALS — BP 138/86 | HR 63 | Temp 98.2°F | Resp 16 | Ht 62.0 in | Wt 112.8 lb

## 2023-07-26 DIAGNOSIS — Z9109 Other allergy status, other than to drugs and biological substances: Secondary | ICD-10-CM | POA: Insufficient documentation

## 2023-07-26 DIAGNOSIS — J0110 Acute frontal sinusitis, unspecified: Secondary | ICD-10-CM | POA: Diagnosis not present

## 2023-07-26 MED ORDER — LEVOCETIRIZINE DIHYDROCHLORIDE 5 MG PO TABS: 5.0000 mg | ORAL_TABLET | Freq: Every evening | ORAL | 3 refills | Status: DC

## 2023-07-26 MED ORDER — AZITHROMYCIN 250 MG PO TABS: ORAL_TABLET | ORAL | 0 refills | Status: AC

## 2023-07-26 NOTE — Patient Instructions (Signed)
 Please start taking Azithromycin  for acute sinusitis.  Please take Xyzal for allergies. Continue to use Flonase  for nasal congestion/allergies.  Please use sinus inhaler as needed for nasal congestion.

## 2023-07-26 NOTE — Assessment & Plan Note (Signed)
 Nasal congestion with postnasal drip, with normal ear exam - likely acute sinusitis Started empiric azithromycin  as she has persistent symptoms despite symptomatic treatment, has completed Augmentin  in the last month Advised to use Flonase  for nasal congestion Phenol throat spray as needed for sore throat, advised to perform warm water  gargling for sore throat Maintain adequate hydration If recurrent symptoms, will need ENT evaluation

## 2023-07-26 NOTE — Progress Notes (Addendum)
 Acute Office Visit  Subjective:    Patient ID: Sandra Riley, female    DOB: May 19, 1960, 63 y.o.   MRN: [id number]  Chief Complaint  Patient presents with   Sore Throat    Has been having off and on sore throat and left ear pain    HPI Patient is in today for complaint of nasal congestion, left ear fullness/pain and left-sided throat pain for the last 2 weeks.  She was given oral Augmentin  in the last month for acute sinusitis, which had improved her symptoms for few days.  She has been told of allergic rhinitis as well and has tried taking OTC antihistaminic without much relief.  Denies any fever or chills.  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  Past Medical History:  Diagnosis Date   Abnormal facial hair 05/11/2011   Anxiety and depression    GAD (generalized anxiety disorder)    Headache    Heart murmur    ECHO 06/15/22: SAM of anterior MV leaflet with dynamic LVOT gradient in setting of moderate symmetrical LVH and hyperdynamic LVF, Peak resting gradient mild at 24 mmHg   Hypertension    Insomnia    Psychotic disorder (HCC)    Seizure disorder (HCC)    stress related seizures. 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[facility])     Past Surgical History:  Procedure Laterality Date   ABDOMINAL HYSTERECTOMY     BREAST BIOPSY Left 08/24/2022   US  LT BREAST BX W LOC DEV 1ST LESION IMG BX SPEC US  GUIDE 08/24/2022 [facility]-ULTRASOUND   CESAREAN SECTION     CHOLECYSTECTOMY     COLONOSCOPY WITH PROPOFOL  N/A 02/15/2019   Procedure: COLONOSCOPY WITH PROPOFOL ;  Surgeon: Shaaron Lamar HERO, MD;  Location: [facility];  Service: Endoscopy;  Laterality: N/A;  2:45pm   POLYPECTOMY  02/15/2019   Procedure: POLYPECTOMY;  Surgeon: Shaaron Lamar HERO, MD;  Location: [facility];  Service: Endoscopy;;   TOTAL HIP ARTHROPLASTY Left 01/27/2022   Procedure: TOTAL HIP ARTHROPLASTY ANTERIOR APPROACH;  Surgeon: Jerri Kay HERO, MD;  Location: [facility] OR;  Service: Orthopedics;   Laterality: Left;   VAGUS NERVE STIMULATOR INSERTION Left 02/17/2023   Procedure: VAGAL NERVE STIMULATOR IMPLANT;  Surgeon: Lanis Pupa, MD;  Location: [facility] OR;  Service: Neurosurgery;  Laterality: Left;  3C    Family History  Problem Relation Age of Onset   Breast cancer Mother 16   Coronary artery disease Father    Stroke Father 39   Breast cancer Sister 78   Breast cancer Sister    Breast cancer Sister    Diabetes Brother    Heart attack Brother    Colon cancer Neg Hx     Social History   Socioeconomic History   Marital status: Married    Spouse name: Jerel   Number of children: 2   Years of education: 12th   Highest education level: 12th grade  Occupational History   Occupation: disabled  Tobacco Use   Smoking status: Never   Smokeless tobacco: Never  Vaping Use   Vaping status: Never Used  Substance and Sexual Activity   Alcohol use: No   Drug use: No   Sexual activity: Yes    Birth control/protection: Surgical  Other Topics Concern   Not on file  Social History Narrative   Right handed   Caffeine-1 cup daily   Lives with husband      Oldest son passed  away in 2023   Social Drivers of Health   Financial Resource Strain: Low Risk  (07/08/2022)   Overall Financial Resource Strain (CARDIA)    Difficulty of Paying Living Expenses: Not hard at all  Food Insecurity: No Food Insecurity (07/08/2022)   Hunger Vital Sign    Worried About Running Out of Food in the Last Year: Never true    Ran Out of Food in the Last Year: Never true  Transportation Needs: No Transportation Needs (07/08/2022)   PRAPARE - Administrator, Civil Service (Medical): No    Lack of Transportation (Non-Medical): No  Physical Activity: Inactive (07/08/2022)   Exercise Vital Sign    Days of Exercise per Week: 0 days    Minutes of Exercise per Session: 0 min  Stress: No Stress Concern Present (07/08/2022)   [employer] of Occupational Health - Occupational Stress  Questionnaire    Feeling of Stress : Not at all  Social Connections: Moderately Isolated (07/08/2022)   Social Connection and Isolation Panel    Frequency of Communication with Friends and Family: Three times a week    Frequency of Social Gatherings with Friends and Family: Twice a week    Attends Religious Services: Never    Database administrator or Organizations: No    Attends Banker Meetings: Never    Marital Status: Married  Catering manager Violence: Not At Risk (07/08/2022)   Humiliation, Afraid, Rape, and Kick questionnaire    Fear of Current or Ex-Partner: No    Emotionally Abused: No    Physically Abused: No    Sexually Abused: No    Outpatient Medications Prior to Visit  Medication Sig Dispense Refill   acetaminophen  (TYLENOL ) 500 MG tablet Take 1,000 mg by mouth every 6 (six) hours as needed for moderate pain.     alendronate  (FOSAMAX ) 35 MG tablet TAKE 1 TABLET BY MOUTH ONCE WEEKLY IN THE MORNING with full GLASS of water  ON an EMPTY stomach DO not lay down FOR 30 mins 5 tablet 5   amLODipine  (NORVASC ) 2.5 MG tablet TAKE 1 TABLET BY MOUTH DAILY 90 tablet 1   BIOTIN 5000 PO Take 5,000 mcg by mouth in the morning.     Calcium Carb-Cholecalciferol 1000-800 MG-UNIT TABS Take 2 tablets by mouth in the morning.     Camphor-Eucalyptus-Menthol  (VICKS VAPORUB EX) Apply 1 Application topically as needed (congestion.).     Cholecalciferol (VITAMIN D ) 50 MCG (2000 UT) CAPS Take 1 capsule by mouth in the morning.     clonazePAM  (KLONOPIN ) 1 MG tablet Take 1 tablet (1 mg total) by mouth 2 (two) times daily. 60 tablet 5   ferrous sulfate 325 (65 FE) MG tablet Take 325 mg by mouth in the morning.     fluticasone  (FLONASE ) 50 MCG/ACT nasal spray Place 2 sprays into both nostrils daily. 16 g 6   Humidifier MISC Large humidifier. 1 each 0   levETIRAcetam  (KEPPRA ) 1000 MG tablet Take 2 tablets (2,000 mg total) by mouth 2 (two) times daily. 360 tablet 3   meclizine  (ANTIVERT ) 25 MG  tablet Take 1 tablet (25 mg total) by mouth 3 (three) times daily as needed for dizziness. 30 tablet 2   metoprolol  tartrate (LOPRESSOR ) 25 MG tablet TAKE 1 TABLET BY MOUTH TWICE DAILY 180 tablet 1   mirtazapine  (REMERON ) 45 MG tablet TAKE 1 TABLET BY MOUTH AT BEDTIME 30 tablet 5   Multiple Vitamin (MULTIVITAMIN) tablet Take 1 tablet by mouth in the  morning.     PHENObarbital  (LUMINAL) 100 MG tablet Take 2 tablets (200 mg total) by mouth at bedtime. 180 tablet 3   potassium chloride  (KLOR-CON  M) 10 MEQ tablet Take 1 tablet (10 mEq total) by mouth 2 (two) times daily. 60 tablet 3   spironolactone  (ALDACTONE ) 25 MG tablet Take 1 tablet (25 mg total) by mouth daily. 30 tablet 3   amoxicillin -clavulanate (AUGMENTIN ) 875-125 MG tablet Take 1 tablet by mouth 2 (two) times daily. 12 tablet 0   Chlorphen-Phenyleph-APAP (ALLERGY MULTI-SYMPTOM) 2-5-325 MG TABS Take 2 tablets by mouth 2 (two) times daily.     MAGNESIUM  PO Take 500 mg by mouth in the morning. (Patient not taking: Reported on 07/26/2023)     zinc gluconate 50 MG tablet Take 50 mg by mouth in the morning. (Patient not taking: Reported on 07/26/2023)     ondansetron  (ZOFRAN -ODT) 4 MG disintegrating tablet Take 4 mg by mouth every 6 (six) hours. (Patient not taking: Reported on 07/26/2023)     No facility-administered medications prior to visit.    Allergies  Allergen Reactions   Aspirin     Allergic to non coated ASA Caused gastric ulcers    Nsaids Other (See Comments)    Directed by MD to not take.    Singulair  [Montelukast  Sodium] Other (See Comments)    Became sweaty and nuaseated    Review of Systems  Constitutional:  Positive for fatigue. Negative for chills.  HENT:  Positive for congestion, postnasal drip, sinus pressure, sinus pain and sore throat.        Left ear fullness  Eyes:  Negative for pain and discharge.  Respiratory:  Negative for cough and shortness of breath.   Cardiovascular:  Negative for chest pain and  palpitations.  Gastrointestinal:  Negative for abdominal pain, diarrhea, nausea and vomiting.  Endocrine: Negative for polydipsia and polyuria.  Genitourinary:  Negative for dysuria and hematuria.  Musculoskeletal:  Negative for neck pain and neck stiffness.  Skin:  Negative for rash.  Neurological:  Negative for dizziness and weakness.  Psychiatric/Behavioral:  Negative for agitation and behavioral problems.        Objective:    Physical Exam Vitals reviewed.  Constitutional:      General: She is not in acute distress.    Appearance: She is not diaphoretic.  HENT:     Head: Normocephalic and atraumatic.     Right Ear: Ear canal normal. There is no impacted cerumen.     Left Ear: Ear canal normal. There is no impacted cerumen.     Nose: No congestion.     Right Sinus: Frontal sinus tenderness present.     Left Sinus: Frontal sinus tenderness present.     Mouth/Throat:     Mouth: Mucous membranes are moist.     Pharynx: Posterior oropharyngeal erythema present.   Eyes:     General: No scleral icterus.    Extraocular Movements: Extraocular movements intact.    Cardiovascular:     Rate and Rhythm: Normal rate and regular rhythm.     Heart sounds: Normal heart sounds. No murmur heard. Pulmonary:     Breath sounds: Normal breath sounds. No wheezing or rales.   Musculoskeletal:     Cervical back: Neck supple. No tenderness.     Right lower leg: No edema.     Left lower leg: No edema.   Skin:    General: Skin is warm.     Findings: No rash.   Neurological:  General: No focal deficit present.     Mental Status: She is alert and oriented to person, place, and time.     Gait: Gait abnormal.   Psychiatric:        Mood and Affect: Mood normal.        Behavior: Behavior normal.     BP 138/86 (BP Location: Left Arm)   Pulse 63   Temp 98.2 F (36.8 C) (Oral)   Resp 16   Ht 5' 2 (1.575 m)   Wt 112 lb 12.8 oz (51.2 kg)   SpO2 95%   BMI 20.63 kg/m  Wt Readings  from Last 3 Encounters:  07/26/23 112 lb 12.8 oz (51.2 kg)  07/14/23 114 lb 6.4 oz (51.9 kg)  06/30/23 110 lb 1.9 oz (50 kg)        Assessment & Plan:   Problem List Items Addressed This Visit       Respiratory   Acute non-recurrent frontal sinusitis - Primary   Nasal congestion with postnasal drip, with normal ear exam - likely acute sinusitis Started empiric azithromycin  as she has persistent symptoms despite symptomatic treatment, has completed Augmentin  in the last month Advised to use Flonase  for nasal congestion Phenol throat spray as needed for sore throat, advised to perform warm water  gargling for sore throat Maintain adequate hydration If recurrent symptoms, will need ENT evaluation      Relevant Medications   azithromycin  (ZITHROMAX ) 250 MG tablet   levocetirizine (XYZAL) 5 MG tablet     Other   Environmental allergies   Her symptoms are suggestive of allergic rhinitis as well Started Zyrtec  Advised to use Flonase  as needed for nasal congestion/allergies      Relevant Medications   levocetirizine (XYZAL) 5 MG tablet     Meds ordered this encounter  Medications   azithromycin  (ZITHROMAX ) 250 MG tablet    Sig: Take 2 tablets on day 1, then 1 tablet daily on days 2 through 5    Dispense:  6 tablet    Refill:  0   levocetirizine (XYZAL) 5 MG tablet    Sig: Take 1 tablet (5 mg total) by mouth every evening.    Dispense:  30 tablet    Refill:  3     Daffney Greenly MARLA Blanch, MD

## 2023-07-26 NOTE — Assessment & Plan Note (Signed)
 Her symptoms are suggestive of allergic rhinitis as well Started Zyrtec  Advised to use Flonase  as needed for nasal congestion/allergies

## 2023-08-08 ENCOUNTER — Telehealth: Payer: Self-pay | Admitting: Family Medicine

## 2023-08-08 ENCOUNTER — Ambulatory Visit (HOSPITAL_COMMUNITY)

## 2023-08-08 NOTE — Telephone Encounter (Signed)
 Pt calling back, patient requesting to r/s appointment today. Attempted to transfer pt to imaging to r/s. Patient disconnected.   Please reach out to patient to r/s appointment.

## 2023-08-08 NOTE — Telephone Encounter (Signed)
 Copied from CRM [id number]. Topic: Appointments - Appointment Cancel/Reschedule >> Aug 08, 2023 10:40 AM Donee H wrote: Patient is wanting to cancel and reschedule test for mm aph screening today due to being sick. Would like to see if she can reschedule for next week. [phone]

## 2023-08-11 ENCOUNTER — Inpatient Hospital Stay (HOSPITAL_COMMUNITY)
Admission: EM | Admit: 2023-08-11 | Discharge: 2023-08-13 | DRG: 641 | Disposition: A | Discharge status: Home Health | Attending: Family Medicine | Admitting: Family Medicine

## 2023-08-11 ENCOUNTER — Encounter: Payer: Self-pay | Admitting: Family Medicine

## 2023-08-11 ENCOUNTER — Emergency Department (HOSPITAL_COMMUNITY)

## 2023-08-11 ENCOUNTER — Ambulatory Visit: Admitting: Family Medicine

## 2023-08-11 ENCOUNTER — Encounter (HOSPITAL_COMMUNITY): Payer: Self-pay

## 2023-08-11 ENCOUNTER — Other Ambulatory Visit: Payer: Self-pay

## 2023-08-11 VITALS — BP 121/82 | HR 101 | Resp 18 | Ht 62.0 in | Wt 106.0 lb

## 2023-08-11 DIAGNOSIS — Z823 Family history of stroke: Secondary | ICD-10-CM | POA: Diagnosis not present

## 2023-08-11 DIAGNOSIS — F411 Generalized anxiety disorder: Secondary | ICD-10-CM | POA: Diagnosis present

## 2023-08-11 DIAGNOSIS — I10 Essential (primary) hypertension: Secondary | ICD-10-CM | POA: Diagnosis present

## 2023-08-11 DIAGNOSIS — R634 Abnormal weight loss: Secondary | ICD-10-CM

## 2023-08-11 DIAGNOSIS — R824 Acetonuria: Secondary | ICD-10-CM | POA: Diagnosis present

## 2023-08-11 DIAGNOSIS — Z8249 Family history of ischemic heart disease and other diseases of the circulatory system: Secondary | ICD-10-CM | POA: Diagnosis not present

## 2023-08-11 DIAGNOSIS — F419 Anxiety disorder, unspecified: Secondary | ICD-10-CM | POA: Diagnosis present

## 2023-08-11 DIAGNOSIS — Z9049 Acquired absence of other specified parts of digestive tract: Secondary | ICD-10-CM

## 2023-08-11 DIAGNOSIS — Z803 Family history of malignant neoplasm of breast: Secondary | ICD-10-CM

## 2023-08-11 DIAGNOSIS — E871 Hypo-osmolality and hyponatremia: Secondary | ICD-10-CM | POA: Diagnosis present

## 2023-08-11 DIAGNOSIS — Z1152 Encounter for screening for COVID-19: Secondary | ICD-10-CM

## 2023-08-11 DIAGNOSIS — Z79899 Other long term (current) drug therapy: Secondary | ICD-10-CM | POA: Diagnosis not present

## 2023-08-11 DIAGNOSIS — Z886 Allergy status to analgesic agent status: Secondary | ICD-10-CM | POA: Diagnosis not present

## 2023-08-11 DIAGNOSIS — G40909 Epilepsy, unspecified, not intractable, without status epilepticus: Secondary | ICD-10-CM | POA: Diagnosis present

## 2023-08-11 DIAGNOSIS — H9202 Otalgia, left ear: Secondary | ICD-10-CM | POA: Diagnosis not present

## 2023-08-11 DIAGNOSIS — R400 Somnolence: Secondary | ICD-10-CM

## 2023-08-11 DIAGNOSIS — R718 Other abnormality of red blood cells: Secondary | ICD-10-CM | POA: Insufficient documentation

## 2023-08-11 DIAGNOSIS — E872 Acidosis, unspecified: Secondary | ICD-10-CM | POA: Diagnosis present

## 2023-08-11 DIAGNOSIS — R569 Unspecified convulsions: Secondary | ICD-10-CM | POA: Diagnosis not present

## 2023-08-11 DIAGNOSIS — Z888 Allergy status to other drugs, medicaments and biological substances status: Secondary | ICD-10-CM

## 2023-08-11 DIAGNOSIS — R7401 Elevation of levels of liver transaminase levels: Secondary | ICD-10-CM | POA: Diagnosis present

## 2023-08-11 DIAGNOSIS — J029 Acute pharyngitis, unspecified: Secondary | ICD-10-CM | POA: Diagnosis present

## 2023-08-11 DIAGNOSIS — Z96642 Presence of left artificial hip joint: Secondary | ICD-10-CM | POA: Diagnosis present

## 2023-08-11 DIAGNOSIS — R531 Weakness: Secondary | ICD-10-CM | POA: Diagnosis present

## 2023-08-11 DIAGNOSIS — Z7983 Long term (current) use of bisphosphonates: Secondary | ICD-10-CM

## 2023-08-11 DIAGNOSIS — D75838 Other thrombocytosis: Secondary | ICD-10-CM | POA: Diagnosis present

## 2023-08-11 DIAGNOSIS — R42 Dizziness and giddiness: Secondary | ICD-10-CM | POA: Diagnosis present

## 2023-08-11 DIAGNOSIS — M810 Age-related osteoporosis without current pathological fracture: Secondary | ICD-10-CM | POA: Diagnosis present

## 2023-08-11 DIAGNOSIS — E8729 Other acidosis: Secondary | ICD-10-CM | POA: Diagnosis not present

## 2023-08-11 DIAGNOSIS — E860 Dehydration: Secondary | ICD-10-CM | POA: Diagnosis present

## 2023-08-11 DIAGNOSIS — G40209 Localization-related (focal) (partial) symptomatic epilepsy and epileptic syndromes with complex partial seizures, not intractable, without status epilepticus: Secondary | ICD-10-CM | POA: Diagnosis not present

## 2023-08-11 DIAGNOSIS — R5381 Other malaise: Secondary | ICD-10-CM | POA: Diagnosis not present

## 2023-08-11 DIAGNOSIS — Z9181 History of falling: Secondary | ICD-10-CM

## 2023-08-11 DIAGNOSIS — D75839 Thrombocytosis, unspecified: Secondary | ICD-10-CM | POA: Insufficient documentation

## 2023-08-11 DIAGNOSIS — Z833 Family history of diabetes mellitus: Secondary | ICD-10-CM | POA: Diagnosis not present

## 2023-08-11 DIAGNOSIS — R296 Repeated falls: Secondary | ICD-10-CM

## 2023-08-11 DIAGNOSIS — F32A Depression, unspecified: Secondary | ICD-10-CM | POA: Diagnosis present

## 2023-08-11 LAB — CBC WITH DIFFERENTIAL/PLATELET
Abs Immature Granulocytes: 0.02 K/uL (ref 0.00–0.07)
Basophils Absolute: 0.1 K/uL (ref 0.0–0.1)
Basophils Relative: 1 %
Eosinophils Absolute: 0 K/uL (ref 0.0–0.5)
Eosinophils Relative: 0 %
HCT: 41.5 % (ref 36.0–46.0)
Hemoglobin: 14 g/dL (ref 12.0–15.0)
Immature Granulocytes: 0 %
Lymphocytes Relative: 48 %
Lymphs Abs: 2.8 K/uL (ref 0.7–4.0)
MCH: 34.1 pg — ABNORMAL HIGH (ref 26.0–34.0)
MCHC: 33.7 g/dL (ref 30.0–36.0)
MCV: 101.2 fL — ABNORMAL HIGH (ref 80.0–100.0)
Monocytes Absolute: 0.7 K/uL (ref 0.1–1.0)
Monocytes Relative: 12 %
Neutro Abs: 2.3 K/uL (ref 1.7–7.7)
Neutrophils Relative %: 39 %
Platelets: 401 K/uL — ABNORMAL HIGH (ref 150–400)
RBC: 4.1 MIL/uL (ref 3.87–5.11)
RDW: 12.9 % (ref 11.5–15.5)
WBC: 6 K/uL (ref 4.0–10.5)
nRBC: 0 % (ref 0.0–0.2)

## 2023-08-11 LAB — COMPREHENSIVE METABOLIC PANEL WITH GFR
ALT: 18 U/L (ref 0–44)
AST: 48 U/L — ABNORMAL HIGH (ref 15–41)
Albumin: 3.6 g/dL (ref 3.5–5.0)
Alkaline Phosphatase: 177 U/L — ABNORMAL HIGH (ref 38–126)
Anion gap: 18 — ABNORMAL HIGH (ref 5–15)
BUN: <5 mg/dL — ABNORMAL LOW (ref 8–23)
CO2: 14 mmol/L — ABNORMAL LOW (ref 22–32)
Calcium: 9 mg/dL (ref 8.9–10.3)
Chloride: 93 mmol/L — ABNORMAL LOW (ref 98–111)
Creatinine, Ser: 0.58 mg/dL (ref 0.44–1.00)
GFR, Estimated: >60 mL/min (ref >60)
Glucose, Bld: 79 mg/dL (ref 70–99)
Potassium: 3.9 mmol/L (ref 3.5–5.1)
Sodium: 125 mmol/L — ABNORMAL LOW (ref 135–145)
Total Bilirubin: 1 mg/dL (ref 0.0–1.2)
Total Protein: 7.6 g/dL (ref 6.5–8.1)

## 2023-08-11 LAB — URINALYSIS, ROUTINE W REFLEX MICROSCOPIC
Bacteria, UA: NONE SEEN
Bilirubin Urine: NEGATIVE
Glucose, UA: NEGATIVE mg/dL
Ketones, ur: 20 mg/dL — AB
Leukocytes,Ua: NEGATIVE
Nitrite: NEGATIVE
Protein, ur: NEGATIVE mg/dL
Specific Gravity, Urine: 1.003 — ABNORMAL LOW (ref 1.005–1.030)
pH: 6 (ref 5.0–8.0)

## 2023-08-11 LAB — RESP PANEL BY RT-PCR (RSV, FLU A&B, COVID)  RVPGX2
Influenza A by PCR: NEGATIVE
Influenza B by PCR: NEGATIVE
Resp Syncytial Virus by PCR: NEGATIVE
SARS Coronavirus 2 by RT PCR: NEGATIVE

## 2023-08-11 LAB — GROUP A STREP BY PCR: Group A Strep by PCR: NOT DETECTED

## 2023-08-11 LAB — LACTIC ACID, PLASMA: Lactic Acid, Venous: 0.8 mmol/L (ref 0.5–1.9)

## 2023-08-11 MED ORDER — LEVETIRACETAM 500 MG PO TABS
2000.0000 mg | ORAL_TABLET | Freq: Two times a day (BID) | ORAL | Status: DC
  Administered 2023-08-11 – 2023-08-12 (×2): 2000 mg via ORAL
  Filled 2023-08-11 (×2): qty 4

## 2023-08-11 MED ORDER — SODIUM CHLORIDE 0.9 % IV BOLUS
1000.0000 mL | Freq: Once | INTRAVENOUS | Status: AC
  Administered 2023-08-11: 1000 mL via INTRAVENOUS

## 2023-08-11 MED ORDER — METOPROLOL TARTRATE 25 MG PO TABS
25.0000 mg | ORAL_TABLET | Freq: Two times a day (BID) | ORAL | Status: DC
  Administered 2023-08-11 – 2023-08-13 (×4): 25 mg via ORAL
  Filled 2023-08-11 (×4): qty 1

## 2023-08-11 MED ORDER — MECLIZINE HCL 12.5 MG PO TABS: 25.0000 mg | ORAL_TABLET | Freq: Three times a day (TID) | ORAL | Status: DC | PRN

## 2023-08-11 MED ORDER — ONDANSETRON HCL 4 MG/2ML IJ SOLN: 4.0000 mg | Freq: Four times a day (QID) | INTRAMUSCULAR | Status: DC | PRN

## 2023-08-11 MED ORDER — PHENOBARBITAL 32.4 MG PO TABS
194.4000 mg | ORAL_TABLET | Freq: Every day | ORAL | Status: DC
  Administered 2023-08-11 – 2023-08-12 (×2): 194.4 mg via ORAL
  Filled 2023-08-11 (×2): qty 6

## 2023-08-11 MED ORDER — MENTHOL 3 MG MT LOZG: 1.0000 | LOZENGE | OROMUCOSAL | Status: DC | PRN

## 2023-08-11 MED ORDER — MIRTAZAPINE 30 MG PO TABS
45.0000 mg | ORAL_TABLET | Freq: Every day | ORAL | Status: DC
  Administered 2023-08-11 – 2023-08-12 (×2): 45 mg via ORAL
  Filled 2023-08-11: qty 3
  Filled 2023-08-11: qty 1

## 2023-08-11 MED ORDER — ACETAMINOPHEN 325 MG PO TABS
650.0000 mg | ORAL_TABLET | Freq: Four times a day (QID) | ORAL | Status: DC | PRN
  Administered 2023-08-12 – 2023-08-13 (×4): 650 mg via ORAL
  Filled 2023-08-11 (×4): qty 2

## 2023-08-11 MED ORDER — ENOXAPARIN SODIUM 40 MG/0.4ML IJ SOSY: 40.0000 mg | PREFILLED_SYRINGE | INTRAMUSCULAR | Status: DC

## 2023-08-11 MED ORDER — ONDANSETRON HCL 4 MG PO TABS: 4.0000 mg | ORAL_TABLET | Freq: Four times a day (QID) | ORAL | Status: DC | PRN

## 2023-08-11 MED ORDER — CLONAZEPAM 0.5 MG PO TABS
1.0000 mg | ORAL_TABLET | Freq: Two times a day (BID) | ORAL | Status: DC
  Administered 2023-08-11 – 2023-08-13 (×4): 1 mg via ORAL
  Filled 2023-08-11 (×4): qty 2

## 2023-08-11 MED ORDER — AMLODIPINE BESYLATE 5 MG PO TABS
2.5000 mg | ORAL_TABLET | Freq: Every day | ORAL | Status: DC
  Administered 2023-08-12 – 2023-08-13 (×2): 2.5 mg via ORAL
  Filled 2023-08-11 (×2): qty 1

## 2023-08-11 MED ORDER — ACETAMINOPHEN 500 MG PO TABS
1000.0000 mg | ORAL_TABLET | Freq: Once | ORAL | Status: AC
  Administered 2023-08-11: 1000 mg via ORAL
  Filled 2023-08-11: qty 2

## 2023-08-11 MED ORDER — ACETAMINOPHEN 650 MG RE SUPP: 650.0000 mg | Freq: Four times a day (QID) | RECTAL | Status: DC | PRN

## 2023-08-11 NOTE — H&P (Addendum)
 History and Physical    Patient: Sandra Riley FMW:[id number] DOB: [birth year] DOA: 08/11/2023 DOS: the patient was seen and examined on 08/11/2023 PCP: Antonetta Rollene BRAVO, MD  Patient coming from: Home  Chief Complaint:  Chief Complaint  Patient presents with   Weakness   HPI: Sandra Riley is a 63 y.o. female with medical history significant of hypertension, anxiety, depression, epilepsy who presents to the emergency department due to generalized weakness.  She complained of 1 week onset of sore throat with poor appetite and has had decreased food and fluid intake.  She endorsed not being able to keep any fluid down and feels dehydrated, she also told that she may have lost some weight.  ED Course:  In the emergency department, BP was 144/100, other vital signs were within normal range.  Workup in the ED showed normal CBC except for MCV of one 1.2 and platelets of 4 1.  BMP was significant for sodium of 125, chloride 93, bicarb of 14 AST 48, ALT 18, ALP 177, anion gap 18.  Urinalysis was positive for ketonuria.  Influenza A, B, SARS RSV, RSV was negative, group A strep by PCR was not detected. Chest x-ray showed no acute cardiopulmonary abnormality Patient was treated with Tylenol  and IV NS 1 L was provided.  Review of Systems: Review of systems as noted in the HPI. All other systems reviewed and are negative.   Past Medical History:  Diagnosis Date   Abnormal facial hair 05/11/2011   Anxiety and depression    GAD (generalized anxiety disorder)    Headache    Heart murmur    ECHO 06/15/22: SAM of anterior MV leaflet with dynamic LVOT gradient in setting of moderate symmetrical LVH and hyperdynamic LVF, Peak resting gradient mild at 24 mmHg   Hypertension    Insomnia    Psychotic disorder (HCC)    Seizure disorder (HCC)    stress related seizures. 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[facility])    Past Surgical History:  Procedure Laterality Date    ABDOMINAL HYSTERECTOMY     BREAST BIOPSY Left 08/24/2022   US  LT BREAST BX W LOC DEV 1ST LESION IMG BX SPEC US  GUIDE 08/24/2022 [facility]-ULTRASOUND   CESAREAN SECTION     CHOLECYSTECTOMY     COLONOSCOPY WITH PROPOFOL  N/A 02/15/2019   Procedure: COLONOSCOPY WITH PROPOFOL ;  Surgeon: Shaaron Lamar HERO, MD;  Location: [facility];  Service: Endoscopy;  Laterality: N/A;  2:45pm   POLYPECTOMY  02/15/2019   Procedure: POLYPECTOMY;  Surgeon: Shaaron Lamar HERO, MD;  Location: [facility];  Service: Endoscopy;;   TOTAL HIP ARTHROPLASTY Left 01/27/2022   Procedure: TOTAL HIP ARTHROPLASTY ANTERIOR APPROACH;  Surgeon: Jerri Kay HERO, MD;  Location: [facility] OR;  Service: Orthopedics;  Laterality: Left;   VAGUS NERVE STIMULATOR INSERTION Left 02/17/2023   Procedure: VAGAL NERVE STIMULATOR IMPLANT;  Surgeon: Lanis Pupa, MD;  Location: [facility] OR;  Service: Neurosurgery;  Laterality: Left;  3C    Social History:  reports that she has never smoked. She has never used smokeless tobacco. She reports that she does not drink alcohol and does not use drugs.   Allergies  Allergen Reactions   Aspirin Other (See Comments)    Allergic to non coated ASA Caused gastric ulcers    Nsaids Other (See Comments)    Directed by MD to not take.    Singulair  [Montelukast  Sodium] Nausea Only and Other (See Comments)    Became  sweaty    Family History  Problem Relation Age of Onset   Breast cancer Mother 40   Coronary artery disease Father    Stroke Father 53   Breast cancer Sister 70   Breast cancer Sister    Breast cancer Sister    Diabetes Brother    Heart attack Brother    Colon cancer Neg Hx      Prior to Admission medications   Medication Sig Start Date End Date Taking? Authorizing Provider  acetaminophen  (TYLENOL ) 500 MG tablet Take 1,000 mg by mouth every 6 (six) hours as needed for moderate pain.   Yes [provider]  alendronate  (FOSAMAX ) 35 MG tablet TAKE 1 TABLET BY MOUTH ONCE WEEKLY IN THE MORNING  with full GLASS of water  ON an EMPTY stomach DO not lay down FOR 30 mins 03/28/23  Yes Antonetta Rollene BRAVO, MD  amLODipine  (NORVASC ) 2.5 MG tablet TAKE 1 TABLET BY MOUTH DAILY 07/19/23  Yes Mallipeddi, Vishnu P, MD  Camphor-Eucalyptus-Menthol  (VICKS VAPORUB EX) Apply 1 Application topically as needed (congestion.).   Yes [provider]  clonazePAM  (KLONOPIN ) 1 MG tablet Take 1 tablet (1 mg total) by mouth 2 (two) times daily. 07/05/23  Yes Antonetta Rollene BRAVO, MD  fluticasone  (FLONASE ) 50 MCG/ACT nasal spray Place 2 sprays into both nostrils daily. 07/14/23  Yes Melvenia Manus BRAVO, MD  levETIRAcetam  (KEPPRA ) 1000 MG tablet Take 2 tablets (2,000 mg total) by mouth 2 (two) times daily. 11/18/22 11/13/23 Yes Camara, Amadou, MD  levocetirizine (XYZAL ) 5 MG tablet Take 1 tablet (5 mg total) by mouth every evening. Patient taking differently: Take 5 mg by mouth daily. Allergy multi-symptom relief 07/26/23  Yes Tobie Suzzane POUR, MD  meclizine  (ANTIVERT ) 25 MG tablet Take 1 tablet (25 mg total) by mouth 3 (three) times daily as needed for dizziness. 07/14/23  Yes Melvenia Manus BRAVO, MD  metoprolol  tartrate (LOPRESSOR ) 25 MG tablet TAKE 1 TABLET BY MOUTH TWICE DAILY 07/19/23  Yes Mallipeddi, Vishnu P, MD  mirtazapine  (REMERON ) 45 MG tablet TAKE 1 TABLET BY MOUTH AT BEDTIME 05/24/23  Yes Antonetta Rollene BRAVO, MD  Multiple Vitamin (MULTIVITAMIN) tablet Take 1 tablet by mouth in the morning.   Yes [provider]  PHENObarbital  (LUMINAL) 100 MG tablet Take 2 tablets (200 mg total) by mouth at bedtime. 03/10/23 03/04/24 Yes Onita Duos, MD  Humidifier MISC Large humidifier. 01/13/22   Antonetta Rollene BRAVO, MD    Physical Exam: BP (!) 150/88   Pulse (!) 101   Temp 98 F (36.7 C) (Oral)   Resp (!) 21   Ht 5' 2 (1.575 m)   Wt 48.1 kg   SpO2 100%   BMI 19.39 kg/m   General: 63 y.o. year-old female ill appearing, cachectic, but in no acute distress.  Alert and oriented x3. HEENT: NCAT, EOMI, dry mucous  membrane Neck: Supple, trachea medial Cardiovascular: Regular rate and rhythm with no rubs or gallops.  No thyromegaly or JVD noted.  No lower extremity edema. 2/4 pulses in all 4 extremities. Respiratory: Clear to auscultation with no wheezes or rales. Good inspiratory effort. Abdomen: Soft, nontender nondistended with normal bowel sounds x4 quadrants. Muskuloskeletal: No cyanosis, clubbing or edema noted bilaterally Neuro: CN II-XII intact, strength 5/5 x 4, sensation, reflexes intact Skin: No ulcerative lesions noted or rashes Psychiatry: Mood is appropriate for condition and setting          Labs on Admission:  Basic Metabolic Panel: Recent Labs  Lab 08/11/23 1501  NA 125*  K 3.9  CL 93*  CO2 14*  GLUCOSE 79  BUN <5*  CREATININE 0.58  CALCIUM 9.0   Liver Function Tests: Recent Labs  Lab 08/11/23 1501  AST 48*  ALT 18  ALKPHOS 177*  BILITOT 1.0  PROT 7.6  ALBUMIN 3.6   No results for input(s): LIPASE, AMYLASE in the last 168 hours. No results for input(s): AMMONIA in the last 168 hours. CBC: Recent Labs  Lab 08/11/23 1501  WBC 6.0  NEUTROABS 2.3  HGB 14.0  HCT 41.5  MCV 101.2*  PLT 401*   Cardiac Enzymes: No results for input(s): CKTOTAL, CKMB, CKMBINDEX, TROPONINI in the last 168 hours.  BNP (last 3 results) No results for input(s): BNP in the last 8760 hours.  ProBNP (last 3 results) No results for input(s): PROBNP in the last 8760 hours.  CBG: No results for input(s): GLUCAP in the last 168 hours.  Radiological Exams on Admission: DG Chest Portable 1 View Result Date: 08/11/2023 CLINICAL DATA:  malaise EXAM: PORTABLE CHEST - 1 VIEW COMPARISON:  January 29, 2022 FINDINGS: No focal airspace consolidation, pleural effusion, or pneumothorax. No cardiomegaly.No acute fracture or destructive lesion. Cholecystectomy clips. Vagal nerve stimulator device in the left chest with a single lead terminating in the neck. IMPRESSION: No acute  cardiopulmonary abnormality. Electronically Signed   By: Rogelia Myers M.D.   On: 08/11/2023 18:04    EKG: I independently viewed the EKG done and my findings are as followed:    Assessment/Plan Present on Admission:  Hyponatremia  Essential hypertension  Anxiety and depression  Principal Problem:   Hyponatremia Active Problems:   Anxiety and depression   Seizure (HCC)   Essential hypertension   Dizziness   Generalized weakness   High anion gap metabolic acidosis   Elevated MCV   Thrombocytosis   Transaminitis  Hyponatremia possibly secondary to dehydration Na 125 Continue gentle hydration Continue to monitor sodium with serial BMPs Urine osmolality, serum osmolality and urine sodium will be checked  Generalized weakness in the setting of above Continue management as described above Continue fall precaution Continue PT/OT eval and treat  High anion gap metabolic acidosis Anion gap 18, patient with no history of T2DM Continue IV hydration and continue to monitor for anion gap closure  Sore throat Group A strep by PCR was negative Continue Cepacol  Elevated MCV MCV 101.2; vitamin B12 and folate levels will be checked  Thrombocytosis possibly reactive Platelets 401; continue to monitor platelets with morning labs  Mild transaminitis possibly due to shock liver ALT 48, ALP 177 Continue to monitor liver enzymes  Essential hypertension Continue home amlodipine , metoprolol   Seizure Continue Keppra , phenobarbital   Dizziness Continue meclizine   Anxiety and depression Continue Klonopin  and Remeron    DVT prophylaxis: Lovenox   Code Status: Full code  Family Communication: Son at bedside  Consults: None  Severity of Illness: The appropriate patient status for this patient is INPATIENT. Inpatient status is judged to be reasonable and necessary in order to provide the required intensity of service to ensure the patient's safety. The patient's presenting  symptoms, physical exam findings, and initial radiographic and laboratory data in the context of their chronic comorbidities is felt to place them at high risk for further clinical deterioration. Furthermore, it is not anticipated that the patient will be medically stable for discharge from the hospital within 2 midnights of admission.   * I certify that at the point of admission it is my clinical judgment that the  patient will require inpatient hospital care spanning beyond 2 midnights from the point of admission due to high intensity of service, high risk for further deterioration and high frequency of surveillance required.*  Author: Linsay Vogt, DO 08/11/2023 11:02 PM  For on call review [email or web address].

## 2023-08-11 NOTE — ED Notes (Signed)
 ED Provider at bedside.

## 2023-08-11 NOTE — ED Provider Notes (Signed)
 [place] EMERGENCY DEPARTMENT AT [facility] Provider Note   CSN: [id number] Arrival date & time: 08/11/23  1357     Patient presents with: Weakness   Sandra Riley is a 63 y.o. female.  She has history of hypertension, seizures, osteoporosis.  She presents the ER today for evaluation of generalized weakness, states for the past 7 days she has had a sore throat, has not had an appetite as of able to eat or drink and is feeling very weak.  She is also lost some weight.     Weakness      Prior to Admission medications   Medication Sig Start Date End Date Taking? Authorizing Provider  acetaminophen  (TYLENOL ) 500 MG tablet Take 1,000 mg by mouth every 6 (six) hours as needed for moderate pain.   Yes [provider]  alendronate  (FOSAMAX ) 35 MG tablet TAKE 1 TABLET BY MOUTH ONCE WEEKLY IN THE MORNING with full GLASS of water  ON an EMPTY stomach DO not lay down FOR 30 mins 03/28/23  Yes Antonetta Rollene BRAVO, MD  amLODipine  (NORVASC ) 2.5 MG tablet TAKE 1 TABLET BY MOUTH DAILY 07/19/23  Yes Mallipeddi, Vishnu P, MD  Camphor-Eucalyptus-Menthol  (VICKS VAPORUB EX) Apply 1 Application topically as needed (congestion.).   Yes [provider]  clonazePAM  (KLONOPIN ) 1 MG tablet Take 1 tablet (1 mg total) by mouth 2 (two) times daily. 07/05/23  Yes Antonetta Rollene BRAVO, MD  fluticasone  (FLONASE ) 50 MCG/ACT nasal spray Place 2 sprays into both nostrils daily. 07/14/23  Yes Melvenia Manus BRAVO, MD  levETIRAcetam  (KEPPRA ) 1000 MG tablet Take 2 tablets (2,000 mg total) by mouth 2 (two) times daily. 11/18/22 11/13/23 Yes Camara, Amadou, MD  levocetirizine (XYZAL ) 5 MG tablet Take 1 tablet (5 mg total) by mouth every evening. Patient taking differently: Take 5 mg by mouth daily. Allergy multi-symptom relief 07/26/23  Yes Tobie Suzzane POUR, MD  meclizine  (ANTIVERT ) 25 MG tablet Take 1 tablet (25 mg total) by mouth 3 (three) times daily as needed for dizziness. 07/14/23  Yes Melvenia Manus BRAVO, MD  metoprolol  tartrate (LOPRESSOR ) 25 MG tablet TAKE 1 TABLET BY MOUTH TWICE DAILY 07/19/23  Yes Mallipeddi, Vishnu P, MD  mirtazapine  (REMERON ) 45 MG tablet TAKE 1 TABLET BY MOUTH AT BEDTIME 05/24/23  Yes Antonetta Rollene BRAVO, MD  Multiple Vitamin (MULTIVITAMIN) tablet Take 1 tablet by mouth in the morning.   Yes [provider]  PHENObarbital  (LUMINAL) 100 MG tablet Take 2 tablets (200 mg total) by mouth at bedtime. 03/10/23 03/04/24 Yes Onita Duos, MD  Humidifier MISC Large humidifier. 01/13/22   Antonetta Rollene BRAVO, MD    Allergies: Aspirin, Nsaids, and Singulair  [montelukast  sodium]    Review of Systems  Neurological:  Positive for weakness.    Updated Vital Signs BP 132/73   Pulse 97   Temp 98 F (36.7 C) (Oral)   Resp (!) 21   Ht 5' 2 (1.575 m)   Wt 48.1 kg   SpO2 100%   BMI 19.39 kg/m   Physical Exam Vitals and nursing note reviewed.  Constitutional:      General: She is not in acute distress.    Appearance: She is well-developed.  HENT:     Head: Normocephalic and atraumatic.     Right Ear: Tympanic membrane normal.     Left Ear: Tympanic membrane normal.     Nose: No congestion or rhinorrhea.     Mouth/Throat:     Mouth: Mucous membranes are moist.  Pharynx: No oropharyngeal exudate or posterior oropharyngeal erythema.  Eyes:     Conjunctiva/sclera: Conjunctivae normal.  Cardiovascular:     Rate and Rhythm: Normal rate and regular rhythm.     Heart sounds: No murmur heard. Pulmonary:     Effort: Pulmonary effort is normal. No respiratory distress.     Breath sounds: Normal breath sounds.  Abdominal:     Palpations: Abdomen is soft.     Tenderness: There is no abdominal tenderness. There is no right CVA tenderness, left CVA tenderness, guarding or rebound.  Musculoskeletal:        General: No swelling.     Cervical back: Normal range of motion and neck supple. No rigidity or tenderness.  Lymphadenopathy:     Cervical: No cervical  adenopathy.  Skin:    General: Skin is warm and dry.     Capillary Refill: Capillary refill takes less than 2 seconds.  Neurological:     General: No focal deficit present.     Mental Status: She is alert and oriented to person, place, and time.     Cranial Nerves: No cranial nerve deficit.     Sensory: No sensory deficit.     Motor: No weakness.  Psychiatric:        Mood and Affect: Mood normal.     (all labs ordered are listed, but only abnormal results are displayed) Labs Reviewed  CBC WITH DIFFERENTIAL/PLATELET - Abnormal; Notable for the following components:      Result Value   MCV 101.2 (*)    MCH 34.1 (*)    Platelets 401 (*)    All other components within normal limits  COMPREHENSIVE METABOLIC PANEL WITH GFR - Abnormal; Notable for the following components:   Sodium 125 (*)    Chloride 93 (*)    CO2 14 (*)    BUN <5 (*)    AST 48 (*)    Alkaline Phosphatase 177 (*)    Anion gap 18 (*)    All other components within normal limits  URINALYSIS, ROUTINE W REFLEX MICROSCOPIC - Abnormal; Notable for the following components:   Color, Urine STRAW (*)    Specific Gravity, Urine 1.003 (*)    Hgb urine dipstick MODERATE (*)    Ketones, ur 20 (*)    All other components within normal limits  RESP PANEL BY RT-PCR (RSV, FLU A&B, COVID)  [id number]  GROUP A STREP BY PCR  LACTIC ACID, PLASMA    EKG: None  Radiology: DG Chest Portable 1 View Result Date: 08/11/2023 CLINICAL DATA:  malaise EXAM: PORTABLE CHEST - 1 VIEW COMPARISON:  January 29, 2022 FINDINGS: No focal airspace consolidation, pleural effusion, or pneumothorax. No cardiomegaly.No acute fracture or destructive lesion. Cholecystectomy clips. Vagal nerve stimulator device in the left chest with a single lead terminating in the neck. IMPRESSION: No acute cardiopulmonary abnormality. Electronically Signed   By: Rogelia Myers M.D.   On: 08/11/2023 18:04     Procedures   Medications Ordered in the ED  sodium  chloride 0.9 % bolus 1,000 mL (0 mLs Intravenous Stopped 08/11/23 1754)  acetaminophen  (TYLENOL ) tablet 1,000 mg (1,000 mg Oral Given 08/11/23 1800)    Clinical Course as of 08/11/23 2002  Thu Aug 11, 2023  2001 Was having 1 week of decreased p.o. intake, sore throat and feeling generally weak, labs show sodium 125, she is given IV fluids and is able to tolerate p.o. but is still feeling generally weak, per nurse when they try  to go to the bathroom she still required a lot of assistance.  She has no abdominal pain, no UTI, no fever or chills, posterior pharynx is normal, strep is negative, remainder of ENT exam is normal.  Her headache and sore throat have resolved with Tylenol  but she still feels weak and is not safe to discharge at this time.  He does not feel comfortable going home, discussed with Dr. Adefeso for admission. [CB]    Clinical Course User Index [CB] Suellen Sherran LABOR, PA-C                                 Medical Decision Making Differential diagnosis includes but not limited to viral illness, strep pharyngitis, tonsillar abscess, electrolyte derangement, dehydration, other  Course: Patient has history of seizures, complaining of generalized weakness for the past week.  She relates this to having sore throat and feeling generally unwell which is caused her to have decreased p.o. intake.  She has been very off balance states she has fallen but has not hit her head, though states she did have a fall in May where she hit her head but no recent head injury.  No loss of consciousness, no breakthrough seizures.  States he compliant with all of her medications.  She is complaining of sore throat, palpation of her neck is unremarkable, posterior pharyngeal exam is normal with no swelling, no uvular deviation, no erythema or exudate.  Rapid strep is negative, COVID flu RSV is negative, basic labs ordered due to her generalized weakness which show sodium of 125 CO2 of 14, anion gap of 18,  denies alcohol, methanol intake, denies use of aspirin, Tylenol  or NSAIDs at home, she is not in DKA, likely due to dehydration.  She given IV fluids and is able to tolerate p.o. without difficulty but was very unsteady on her feet and still feels generally weak and does not feel comfortable going home.  I discussed with hospitalist for admission due to persistent generalized weakness in setting of electrolyte derangement with hyponatremia.  Discussed with Dr. Manfred who was agreeable with admission.  Amount and/or Complexity of Data Reviewed Labs: ordered. Decision-making details documented in ED Course. Radiology: ordered and independent interpretation performed.    Details: Chest x-ray shows no pulmonary edema or infiltrate, no pneumothorax  Risk OTC drugs. Decision regarding hospitalization.        Final diagnoses:  Hyponatremia  Generalized weakness  Dehydration    ED Discharge Orders     None          Suellen Sherran LABOR DEVONNA 08/12/23 [id number]    Cleotilde Rogue, MD 08/12/23 1624

## 2023-08-11 NOTE — ED Notes (Signed)
 Pt tolerated drinking water  w/o nausea or vomiting. EDP aware.

## 2023-08-11 NOTE — Assessment & Plan Note (Signed)
 Over 6 week h/o left ear and throat discomfort despite antibiotic treatment , refer ENT for eval

## 2023-08-11 NOTE — Assessment & Plan Note (Signed)
 Home safety reviewed, recent falls are due to poor oral intake with vomiting and loos e stools, needs ED eval, no injury to body reported

## 2023-08-11 NOTE — ED Triage Notes (Signed)
 Pt arrived via POV c/o loss of appetite and weakness X 10 days. Pt reports speaking with her PCP and being advised to seek evaluation in the ER. Pt reports recent weight loss as well.

## 2023-08-11 NOTE — Discharge Instructions (Addendum)
  [facility] [address] [place], [place]  [address]  *[facility] will contact you to schedule home physical & occupational therapy. [phone]      If don't hear from them within 2 days. Call the number listed. *

## 2023-08-11 NOTE — Progress Notes (Signed)
   Sandra Riley     MRN: [id number]      DOB: 11/25/1960  Chief Complaint  Patient presents with   Hypertension    6 week follow up    Sore Throat    Pt complains of left ear pain and sore throat x2 weeks. Denies cough, congestion, and fever     HPI Sandra Riley is here c/o vomiting x 1 for 10 days, nausea and poor appetite, soft loose stool x 10 days, once per day, no mucus or blood in stool. Feels increasingly weak, no fever or chills noted No urine symptoms C/o left ear pain , sore throat and tender neck glands x 2 weeks  ROS Denies recent fever or chills. Denies sinus pressure, nasal congestion,  c/o left ear pain and  sore throat. Denies chest congestion, productive cough or wheezing. Denies chest pains, palpitations and leg swelling  Denies dysuria, frequency, hesitancy or incontinence. C/o seizures x 2 in past 1 week C/o falls x 2 in past 1 week  PE  BP 121/82   Pulse (!) 101   Resp 18   Ht 5' 2 (1.575 m)   Wt 106 lb (48.1 kg)   SpO2 97%   BMI 19.39 kg/m   Patient ill appearing , drowsy  HEENT: No facial asymmetry, EOMI,     Neck supple .Tender left cervical adenoitis  Chest: Clear to auscultation bilaterally.  CVS: S1, S2 no murmurs, no S3.Regular rate.  ABD: Soft non tender.   Ext: No edema  MS: Adequate ROM spine, shoulders, hips and knees.  Skin: Intact, no ulcerations or rash noted.  Psych:  not anxious or depressed appearing.  CNS: CN 2-12 intact, no focal deficits noted.   Assessment & Plan  Partial epilepsy with impairment of consciousness (HCC) Uncontrolled reports 2 seizures in past 1 week  Drowsiness 10 day h/o increased drowsiness, with poor oral intake and falls, needs ED evaluation  Falls Home safety reviewed, recent falls are due to poor oral intake with vomiting and loos e stools, needs ED eval, no injury to body reported  Essential hypertension Controlled, no change in medication   Otalgia, left ear Over 6 week  h/o left ear and throat discomfort despite antibiotic treatment , refer ENT for eval  Weight loss 10 pound weight loss in past month with generalized weakness. V/D eed eval and possible admission

## 2023-08-11 NOTE — Assessment & Plan Note (Signed)
 Controlled, no change in medication

## 2023-08-11 NOTE — Assessment & Plan Note (Signed)
 10 day h/o increased drowsiness, with poor oral intake and falls, needs ED evaluation

## 2023-08-11 NOTE — Patient Instructions (Signed)
 F/U in 3 weeks  You need to go to th ED because of weakness, falls, poor intake for 10 days  I will refer you to ENT for evaluation of chronic left ear pain  Thanks for choosing [facility], we consider it a privelige to serve you.

## 2023-08-11 NOTE — Assessment & Plan Note (Signed)
 Uncontrolled reports 2 seizures in past 1 week

## 2023-08-11 NOTE — Assessment & Plan Note (Signed)
 10 pound weight loss in past month with generalized weakness. V/D eed eval and possible admission

## 2023-08-11 NOTE — ED Provider Notes (Incomplete)
 [place] EMERGENCY DEPARTMENT AT [facility] Provider Note   CSN: [id number] Arrival date & time: 08/11/23  1357     Patient presents with: Weakness   Sandra Riley is a 63 y.o. female.  {Add pertinent medical, surgical, social history, OB history to HPI:32947}  Weakness      Prior to Admission medications   Medication Sig Start Date End Date Taking? Authorizing Provider  acetaminophen  (TYLENOL ) 500 MG tablet Take 1,000 mg by mouth every 6 (six) hours as needed for moderate pain.    [provider]  alendronate  (FOSAMAX ) 35 MG tablet TAKE 1 TABLET BY MOUTH ONCE WEEKLY IN THE MORNING with full GLASS of water  ON an EMPTY stomach DO not lay down FOR 30 mins 03/28/23   Antonetta Rollene BRAVO, MD  amLODipine  (NORVASC ) 2.5 MG tablet TAKE 1 TABLET BY MOUTH DAILY 07/19/23   Mallipeddi, Vishnu P, MD  amoxicillin  (AMOXIL ) 500 MG capsule Take 500 mg by mouth 2 (two) times daily. 07/22/23   [provider]  BIOTIN 5000 PO Take 5,000 mcg by mouth in the morning.    [provider]  Calcium Carb-Cholecalciferol 1000-800 MG-UNIT TABS Take 2 tablets by mouth in the morning.    [provider]  Camphor-Eucalyptus-Menthol  (VICKS VAPORUB EX) Apply 1 Application topically as needed (congestion.).    [provider]  Cholecalciferol (VITAMIN D ) 50 MCG (2000 UT) CAPS Take 1 capsule by mouth in the morning.    [provider]  clonazePAM  (KLONOPIN ) 1 MG tablet Take 1 tablet (1 mg total) by mouth 2 (two) times daily. 07/05/23   Antonetta Rollene BRAVO, MD  ferrous sulfate 325 (65 FE) MG tablet Take 325 mg by mouth in the morning.    [provider]  fluticasone  (FLONASE ) 50 MCG/ACT nasal spray Place 2 sprays into both nostrils daily. 07/14/23   Melvenia Manus BRAVO, MD  Humidifier MISC Large humidifier. 01/13/22   Antonetta Rollene BRAVO, MD  levETIRAcetam  (KEPPRA ) 1000 MG tablet Take 2 tablets (2,000 mg total) by mouth 2 (two) times daily.  11/18/22 11/13/23  Camara, Amadou, MD  levocetirizine (XYZAL ) 5 MG tablet Take 1 tablet (5 mg total) by mouth every evening. 07/26/23   Tobie Suzzane POUR, MD  MAGNESIUM  PO Take 500 mg by mouth in the morning. Patient not taking: Reported on 08/11/2023    [provider]  meclizine  (ANTIVERT ) 25 MG tablet Take 1 tablet (25 mg total) by mouth 3 (three) times daily as needed for dizziness. 07/14/23   Melvenia Manus BRAVO, MD  metoprolol  tartrate (LOPRESSOR ) 25 MG tablet TAKE 1 TABLET BY MOUTH TWICE DAILY 07/19/23   Mallipeddi, Vishnu P, MD  mirtazapine  (REMERON ) 45 MG tablet TAKE 1 TABLET BY MOUTH AT BEDTIME 05/24/23   Antonetta Rollene BRAVO, MD  Multiple Vitamin (MULTIVITAMIN) tablet Take 1 tablet by mouth in the morning.    [provider]  PHENObarbital  (LUMINAL) 100 MG tablet Take 2 tablets (200 mg total) by mouth at bedtime. 03/10/23 03/04/24  Onita Duos, MD  potassium chloride  (KLOR-CON  M) 10 MEQ tablet Take 1 tablet (10 mEq total) by mouth 2 (two) times daily. 07/15/23   Antonetta Rollene BRAVO, MD  spironolactone  (ALDACTONE ) 25 MG tablet Take 1 tablet (25 mg total) by mouth daily. 06/30/23   Antonetta Rollene BRAVO, MD  zinc gluconate 50 MG tablet Take 50 mg by mouth in the morning. Patient not taking: Reported on 08/11/2023    [provider]    Allergies: Aspirin, Nsaids, and  Singulair  [montelukast  sodium]    Review of Systems  Neurological:  Positive for weakness.    Updated Vital Signs BP (!) 155/99   Pulse 97   Temp 98.1 F (36.7 C)   Resp 18   Ht 5' 2 (1.575 m)   Wt 48.1 kg   SpO2 100%   BMI 19.39 kg/m   Physical Exam  (all labs ordered are listed, but only abnormal results are displayed) Labs Reviewed  CBC WITH DIFFERENTIAL/PLATELET - Abnormal; Notable for the following components:      Result Value   MCV 101.2 (*)    MCH 34.1 (*)    Platelets 401 (*)    All other components within normal limits  COMPREHENSIVE METABOLIC PANEL WITH GFR - Abnormal; Notable for the  following components:   Sodium 125 (*)    Chloride 93 (*)    CO2 14 (*)    BUN <5 (*)    AST 48 (*)    Alkaline Phosphatase 177 (*)    Anion gap 18 (*)    All other components within normal limits    EKG: None  Radiology: No results found.  {Document cardiac monitor, telemetry assessment procedure when appropriate:32947} Procedures   Medications Ordered in the ED - No data to display    {Click here for ABCD2, HEART and other calculators REFRESH Note before signing:1}                              Medical Decision Making Amount and/or Complexity of Data Reviewed Labs: ordered.   ***  {Document critical care time when appropriate  Document review of labs and clinical decision tools ie CHADS2VASC2, etc  Document your independent review of radiology images and any outside records  Document your discussion with family members, caretakers and with consultants  Document social determinants of health affecting pt's care  Document your decision making why or why not admission, treatments were needed:32947:::1}   Final diagnoses:  None    ED Discharge Orders     None

## 2023-08-12 DIAGNOSIS — E871 Hypo-osmolality and hyponatremia: Secondary | ICD-10-CM | POA: Diagnosis not present

## 2023-08-12 LAB — COMPREHENSIVE METABOLIC PANEL WITH GFR
ALT: 20 U/L (ref 0–44)
AST: 51 U/L — ABNORMAL HIGH (ref 15–41)
Albumin: 3.1 g/dL — ABNORMAL LOW (ref 3.5–5.0)
Alkaline Phosphatase: 153 U/L — ABNORMAL HIGH (ref 38–126)
Anion gap: 9 (ref 5–15)
BUN: <5 mg/dL — ABNORMAL LOW (ref 8–23)
CO2: 18 mmol/L — ABNORMAL LOW (ref 22–32)
Calcium: 8.4 mg/dL — ABNORMAL LOW (ref 8.9–10.3)
Chloride: 106 mmol/L (ref 98–111)
Creatinine, Ser: 0.54 mg/dL (ref 0.44–1.00)
GFR, Estimated: >60 mL/min (ref >60)
Glucose, Bld: 127 mg/dL — ABNORMAL HIGH (ref 70–99)
Potassium: 3.5 mmol/L (ref 3.5–5.1)
Sodium: 133 mmol/L — ABNORMAL LOW (ref 135–145)
Total Bilirubin: 0.8 mg/dL (ref 0.0–1.2)
Total Protein: 6.6 g/dL (ref 6.5–8.1)

## 2023-08-12 LAB — HIV ANTIBODY (ROUTINE TESTING W REFLEX): HIV Screen 4th Generation wRfx: NONREACTIVE

## 2023-08-12 LAB — CBC
HCT: 39.1 % (ref 36.0–46.0)
Hemoglobin: 13 g/dL (ref 12.0–15.0)
MCH: 33.2 pg (ref 26.0–34.0)
MCHC: 33.2 g/dL (ref 30.0–36.0)
MCV: 100 fL (ref 80.0–100.0)
Platelets: 369 K/uL (ref 150–400)
RBC: 3.91 MIL/uL (ref 3.87–5.11)
RDW: 13.1 % (ref 11.5–15.5)
WBC: 6 K/uL (ref 4.0–10.5)
nRBC: 0 % (ref 0.0–0.2)

## 2023-08-12 LAB — MRSA NEXT GEN BY PCR, NASAL: MRSA by PCR Next Gen: NOT DETECTED

## 2023-08-12 LAB — PHOSPHORUS: Phosphorus: 1.6 mg/dL — ABNORMAL LOW (ref 2.5–4.6)

## 2023-08-12 LAB — SODIUM, URINE, RANDOM: Sodium, Ur: 11 mmol/L

## 2023-08-12 LAB — FOLATE: Folate: 3.4 ng/mL — ABNORMAL LOW (ref >5.9)

## 2023-08-12 LAB — VITAMIN B12: Vitamin B-12: 469 pg/mL (ref 180–914)

## 2023-08-12 LAB — MAGNESIUM: Magnesium: 2.1 mg/dL (ref 1.7–2.4)

## 2023-08-12 LAB — OSMOLALITY, URINE: Osmolality, Ur: 85 mosm/kg — ABNORMAL LOW (ref 300–900)

## 2023-08-12 LAB — OSMOLALITY: Osmolality: 291 mosm/kg (ref 275–295)

## 2023-08-12 MED ORDER — CHLORHEXIDINE GLUCONATE CLOTH 2 % EX PADS
6.0000 | MEDICATED_PAD | Freq: Every day | CUTANEOUS | Status: DC
  Administered 2023-08-12: 6 via TOPICAL

## 2023-08-12 MED ORDER — SODIUM CHLORIDE 0.9 % IV SOLN: INTRAVENOUS | Status: AC

## 2023-08-12 MED ORDER — LEVETIRACETAM 500 MG PO TABS
500.0000 mg | ORAL_TABLET | Freq: Two times a day (BID) | ORAL | Status: DC
  Administered 2023-08-12 – 2023-08-13 (×2): 500 mg via ORAL
  Filled 2023-08-12 (×2): qty 1

## 2023-08-12 MED ORDER — PHENOL 1.4 % MT LIQD
1.0000 | OROMUCOSAL | Status: DC | PRN
  Administered 2023-08-12: 1 via OROMUCOSAL
  Filled 2023-08-12: qty 177

## 2023-08-12 MED ORDER — ENSURE PLUS HIGH PROTEIN PO LIQD
237.0000 mL | Freq: Two times a day (BID) | ORAL | Status: DC
  Administered 2023-08-12 (×2): 237 mL via ORAL

## 2023-08-12 NOTE — Evaluation (Signed)
 Physical Therapy Evaluation Patient Details Name: Sandra Riley MRN: [id number] DOB: 08-May-1960 Today's Date: 08/12/2023  History of Present Illness  Sandra Riley is a 63 y.o. female with medical history significant of hypertension, anxiety, depression, epilepsy who presents to the emergency department due to generalized weakness.  She complained of 1 week onset of sore throat with poor appetite and has had decreased food and fluid intake.  She endorsed not being able to keep any fluid down and feels dehydrated, she also told that she may have lost some weight. (per MD)  Clinical Impression   Pt tolerated today's co-evaluation, well. Returned mobility and ambulation with unsteadiness and CGA throughout session with impulsive movements. Pt's baseline is independent with household and community ambulation using rollator. Currently, pt demonstrating significant safety and increased falls risks during ADLs and ambulation due to muscle weakness and balance deficits.   Based upon these deficits/impairments, patient will benefit from continued skilled physical therapy services during remainder of hospital stay and at the next recommended venue of care to address deficits and promote return to optimal function.                If plan is discharge home, recommend the following: A little help with walking and/or transfers;A little help with bathing/dressing/bathroom   Can travel by private vehicle        Equipment Recommendations None recommended by PT  Recommendations for Other Services       Functional Status Assessment Patient has had a recent decline in their functional status and demonstrates the ability to make significant improvements in function in a reasonable and predictable amount of time.     Precautions / Restrictions Precautions Precautions: Fall Recall of Precautions/Restrictions: Impaired Restrictions Weight Bearing Restrictions Per Provider Order: No       Mobility  Bed Mobility               General bed mobility comments: Pt seated in the chair to start the session. Patient Response: Cooperative  Transfers Overall transfer level: Needs assistance Equipment used: Rollator (4 wheels) Transfers: Sit to/from Stand, Bed to chair/wheelchair/BSC Sit to Stand: Supervision   Step pivot transfers: Supervision       General transfer comment: using rollator; supervision for safety    Ambulation/Gait Ambulation/Gait assistance: Contact guard assist Gait Distance (Feet): 75 Feet Assistive device: Rollator (4 wheels) Gait Pattern/deviations: WFL(Within Functional Limits), Staggering left, Staggering right, Trunk flexed       General Gait Details: impulsive during ambulation with constant cues for navigating environment. Pt wiht CGA with moderate unsteadiness.  Stairs            Wheelchair Mobility     Tilt Bed Tilt Bed Patient Response: Cooperative  Modified Rankin (Stroke Patients Only)       Balance Overall balance assessment: Needs assistance Sitting-balance support: No upper extremity supported, Feet supported Sitting balance-Leahy Scale: Good Sitting balance - Comments: seated in recliner   Standing balance support: Bilateral upper extremity supported, During functional activity Standing balance-Leahy Scale: Fair Standing balance comment: using rollator                             Pertinent Vitals/Pain Pain Assessment Pain Assessment: Faces Faces Pain Scale: Hurts a little bit Pain Location: head Pain Descriptors / Indicators: Headache Pain Intervention(s): Monitored during session    Home Living Family/patient expects to be discharged to:: Private residence Living Arrangements: Spouse/significant other;Children Available Help  at Discharge: Family;Available 24 hours/day Type of Home: Apartment Home Access: Level entry       Home Layout: One level Home Equipment: Rollator (4  wheels);Shower seat;Grab bars - tub/shower;Grab bars - toilet      Prior Function Prior Level of Function : Needs assist       Physical Assist : ADLs (physical)   ADLs (physical): IADLs Mobility Comments: Ambualtes with rollator; does not drive ADLs Comments: MOD I ADL; PRN assist IADL's.     Extremity/Trunk Assessment   Upper Extremity Assessment Upper Extremity Assessment: Defer to OT evaluation    Lower Extremity Assessment Lower Extremity Assessment: Generalized weakness;RLE deficits/detail;LLE deficits/detail RLE Deficits / Details: 4-/5 knee extension RLE Sensation: WNL LLE Deficits / Details: 4-/5 knee extension LLE Sensation: WNL    Cervical / Trunk Assessment Cervical / Trunk Assessment: Kyphotic  Communication   Communication Communication: No apparent difficulties    Cognition Arousal: Alert Behavior During Therapy: Impulsive                             Following commands: Intact       Cueing Cueing Techniques: Verbal cues, Tactile cues     General Comments      Exercises     Assessment/Plan    PT Assessment Patient needs continued PT services  PT Problem List Decreased strength;Decreased activity tolerance;Decreased balance;Decreased mobility       PT Treatment Interventions Gait training;Functional mobility training;Therapeutic activities;Therapeutic exercise;Balance training;Neuromuscular re-education    PT Goals (Current goals can be found in the Care Plan section)  Acute Rehab PT Goals Patient Stated Goal: home with family to assist PT Goal Formulation: With patient Time For Goal Achievement: 08/26/23 Potential to Achieve Goals: Good    Frequency Min 2X/week     Co-evaluation PT/OT/SLP Co-Evaluation/Treatment: Yes Reason for Co-Treatment: To address functional/ADL transfers PT goals addressed during session: Mobility/safety with mobility OT goals addressed during session: ADL's and self-care       AM-PAC PT 6  Clicks Mobility  Outcome Measure Help needed turning from your back to your side while in a flat bed without using bedrails?: None Help needed moving from lying on your back to sitting on the side of a flat bed without using bedrails?: None Help needed moving to and from a bed to a chair (including a wheelchair)?: A Little Help needed standing up from a chair using your arms (e.g., wheelchair or bedside chair)?: A Little Help needed to walk in hospital room?: A Little Help needed climbing 3-5 steps with a railing? : A Lot 6 Click Score: 19    End of Session Equipment Utilized During Treatment: Gait belt Activity Tolerance: Patient tolerated treatment well Patient left: in chair;with call bell/phone within reach Nurse Communication: Mobility status PT Visit Diagnosis: Unsteadiness on feet (R26.81);Muscle weakness (generalized) (M62.81)    Time: 9081-9068 PT Time Calculation (min) (ACUTE ONLY): 13 min   Charges:       PT General Charges $$ ACUTE PT VISIT: 1 Visit         Omega JONETTA Donna ALMETA, DPT [facility] [place] [phone] office  Omega JONETTA Donna 08/12/2023, 10:27 AM

## 2023-08-12 NOTE — Evaluation (Signed)
 Occupational Therapy Evaluation Patient Details Name: Sandra Riley MRN: [id number] DOB: Jun 01, 1960 Today's Date: 08/12/2023   History of Present Illness   Sandra Riley is a 63 y.o. female with medical history significant of hypertension, anxiety, depression, epilepsy who presents to the emergency department due to generalized weakness.  She complained of 1 week onset of sore throat with poor appetite and has had decreased food and fluid intake.  She endorsed not being able to keep any fluid down and feels dehydrated, she also told that she may have lost some weight. (per DO)     Clinical Impressions Pt agreeable to OT and PT co-evaluation. Pt demonstrated general B UE weakness with good functional use. Able to don and doff a sock without physical assist seated in the recliner. Pt reports 24/7 assist from spouse. Pt able to ambulate with supervision for safety. Pt seemed mildly impulsive but followed commands well. Pt left in the chair with call bell within reach. Pt is not recommended for further acute OT services and will be discharged to care of nursing staff and mobility team for remaining length of stay.      If plan is discharge home, recommend the following:   A little help with walking and/or transfers;A little help with bathing/dressing/bathroom;Assistance with cooking/housework;Assist for transportation;Help with stairs or ramp for entrance     Functional Status Assessment   Patient has had a recent decline in their functional status and demonstrates the ability to make significant improvements in function in a reasonable and predictable amount of time.     Equipment Recommendations   None recommended by OT             Precautions/Restrictions   Precautions Precautions: Fall Recall of Precautions/Restrictions: Impaired Restrictions Weight Bearing Restrictions Per Provider Order: No     Mobility Bed Mobility               General bed  mobility comments: Pt seated in the chair to start the session.    Transfers Overall transfer level: Needs assistance Equipment used: Rollator (4 wheels) Transfers: Sit to/from Stand, Bed to chair/wheelchair/BSC Sit to Stand: Supervision     Step pivot transfers: Supervision     General transfer comment: using rollator; supervision for safety      Balance Overall balance assessment: Needs assistance Sitting-balance support: No upper extremity supported, Feet supported Sitting balance-Leahy Scale: Good Sitting balance - Comments: seated in recliner   Standing balance support: Bilateral upper extremity supported, During functional activity Standing balance-Leahy Scale: Fair Standing balance comment: using rollator                           ADL either performed or assessed with clinical judgement   ADL Overall ADL's : Needs assistance/impaired     Grooming: Supervision/safety;Standing   Upper Body Bathing: Modified independent;Set up;Sitting   Lower Body Bathing: Set up;Sitting/lateral leans;Supervison/ safety   Upper Body Dressing : Modified independent;Set up;Sitting   Lower Body Dressing: Supervision/safety;Sitting/lateral leans;Set up Lower Body Dressing Details (indicate cue type and reason): able to doff and don sock without physical assist seated in the recliner Toilet Transfer: Supervision/safety;Rollator (4 wheels);Ambulation Toilet Transfer Details (indicate cue type and reason): simulated via ambulation in the room with rollator Toileting- Clothing Manipulation and Hygiene: Supervision/safety;Set up;Sitting/lateral lean       Functional mobility during ADLs: Supervision/safety;Rollator (4 wheels)       Vision Baseline Vision/History: 1 Wears glasses Ability to See  in Adequate Light: 1 Impaired Patient Visual Report: No change from baseline Vision Assessment?: No apparent visual deficits     Perception Perception: Not tested       Praxis  Praxis: Not tested       Pertinent Vitals/Pain Pain Assessment Pain Assessment: Faces Faces Pain Scale: Hurts a little bit Pain Location: head Pain Descriptors / Indicators: Headache Pain Intervention(s): Monitored during session     Extremity/Trunk Assessment Upper Extremity Assessment Upper Extremity Assessment: Generalized weakness   Lower Extremity Assessment Lower Extremity Assessment: Defer to PT evaluation   Cervical / Trunk Assessment Cervical / Trunk Assessment: Kyphotic   Communication Communication Communication: No apparent difficulties   Cognition Arousal: Alert Behavior During Therapy: Impulsive Cognition: No family/caregiver present to determine baseline             OT - Cognition Comments: Oreiented well to place, person, time, and president. Mildly impulsive trying to get out of the recliner before putting the leg rest down.                 Following commands: Intact       Cueing  General Comments   Cueing Techniques: Verbal cues;Tactile cues                 Home Living Family/patient expects to be discharged to:: Private residence Living Arrangements: Spouse/significant other;Children Available Help at Discharge: Family;Available 24 hours/day Type of Home: Apartment Home Access: Level entry     Home Layout: One level     Bathroom Shower/Tub: Producer, television/film/video: Handicapped height     Home Equipment: Rollator (4 wheels);Shower seat;Grab bars - tub/shower;Grab bars - toilet          Prior Functioning/Environment Prior Level of Function : Needs assist       Physical Assist : ADLs (physical)   ADLs (physical): IADLs Mobility Comments: Ambualtes with rollator; does not drive ADLs Comments: MOD I ADL; PRN assist IADL's.                            Co-evaluation PT/OT/SLP Co-Evaluation/Treatment: Yes Reason for Co-Treatment: To address functional/ADL transfers   OT goals addressed during  session: ADL's and self-care                       End of Session Equipment Utilized During Treatment: Rollator (4 wheels);Gait belt  Activity Tolerance: Patient tolerated treatment well Patient left: in chair;with call bell/phone within reach  OT Visit Diagnosis: Unsteadiness on feet (R26.81);Other abnormalities of gait and mobility (R26.89);Muscle weakness (generalized) (M62.81)                Time: 9081-9068 OT Time Calculation (min): 13 min Charges:  OT General Charges $OT Visit: 1 Visit OT Evaluation $OT Eval Low Complexity: 1 Low  Shykeem Resurreccion OT, MOT   Jayson Person 08/12/2023, 10:03 AM

## 2023-08-12 NOTE — TOC Initial Note (Signed)
 Transition of Care [facility]) - Initial/Assessment Note    Patient Details  Name: Sandra Riley MRN: [id number] Date of Birth: 09-Oct-1960  [facility] CM/SW Contact:    Noreen KATHEE Pinal, LCSWA Phone Number: 08/12/2023, 1:27 PM  Clinical Narrative:                  CSW spoke with patient at bedside and assessed her. Patient lives in the home with spouse . Patient shared that her husband helps her out and that she has someone who comes out for 4-5 hours through [place]. PT recommended for HHPT , patient agreeable and had no preference. Jennifer with [facility] able to offer HHPT and HHOT . TOC to follow.   Expected Discharge Plan: Home w Home Health Services Barriers to Discharge: Continued Medical Work up   Patient Goals and CMS Choice Patient states their goals for this hospitalization and ongoing recovery are:: return back home CMS Medicare.gov Compare Post Acute Care list provided to:: Patient Choice offered to / list presented to : Patient      Expected Discharge Plan and Services In-house Referral: Clinical Social Work   Post Acute Care Choice: Durable Medical Equipment Living arrangements for the past 2 months: Single Family Home                           HH Arranged: PT, OT HH Agency: [facility] Date [facility] Agency Contacted: 08/12/23 Time HH Agency Contacted: 1321 Representative spoke with at [facility] Agency: [place]  Prior Living Arrangements/Services Living arrangements for the past 2 months: Single Family Home Lives with:: Spouse Patient language and need for interpreter reviewed:: Yes Do you feel safe going back to the place where you live?: Yes      Need for Family Participation in Patient Care: Yes (Comment) Care giver support system in place?: No (comment) Current home services: Other (comment) (aides through [facility]) Criminal Activity/Legal Involvement Pertinent to Current Situation/Hospitalization: No - Comment as needed  Activities  of Daily Living   ADL Screening (condition at time of admission) Independently performs ADLs?: Yes (appropriate for developmental age) Is the patient deaf or have difficulty hearing?: No Does the patient have difficulty seeing, even when wearing glasses/contacts?: No Does the patient have difficulty concentrating, remembering, or making decisions?: No  Permission Sought/Granted      Share Information with NAME: [place]     Permission granted to share info w Relationship: Patient     Emotional Assessment Appearance:: Appears stated age Attitude/Demeanor/Rapport: Engaged Affect (typically observed): Accepting Orientation: : Oriented to Self, Oriented to Place, Oriented to  Time, Oriented to Situation Alcohol / Substance Use: Not Applicable Psych Involvement: No (comment)  Admission diagnosis:  Dehydration [E86.0] Hyponatremia [E87.1] Generalized weakness [R53.1] Patient Active Problem List   Diagnosis Date Noted   Hyponatremia 08/11/2023   Falls 08/11/2023   Drowsiness 08/11/2023   Otalgia, left ear 08/11/2023   Weight loss 08/11/2023   Generalized weakness 08/11/2023   High anion gap metabolic acidosis 08/11/2023   Elevated MCV 08/11/2023   Thrombocytosis 08/11/2023   Transaminitis 08/11/2023   Environmental allergies 07/26/2023   Dizziness 07/14/2023   Maxillary sinusitis 06/30/2023   Encounter for screening mammogram for malignant neoplasm of breast 06/30/2023   Acute non-recurrent frontal sinusitis 06/22/2023   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   Viral illness 04/22/2023   Encounter for Medicare annual examination with abnormal findings 12/27/2022   Unsteady gait 12/27/2022  Compression fracture of L4 lumbar vertebra, sequela 12/27/2022   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   Elevated alkaline phosphatase level 10/06/2022   Palpitations 08/31/2022   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   Murmur, cardiac 05/20/2022    Tachycardia 05/16/2022   Nausea 05/16/2022   Femoral neck fracture (HCC) 02/25/2022   Displaced fracture of base of neck of left femur, initial encounter for closed fracture (HCC) 01/26/2022   Depression, major, single episode, severe (HCC) 04/12/2021   Grief at loss of child 04/10/2021   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 - [place], [place] - [address] [place] [address] Phone: [phone] Fax: [phone]     Social Drivers of Health (SDOH) Social History: SDOH Screenings   Food Insecurity: No Food Insecurity (08/11/2023)  Housing: Low Risk  (08/11/2023)  Transportation Needs: No Transportation Needs (08/11/2023)  Utilities: Not At Risk (08/11/2023)  Alcohol Screen: Low Risk  (07/08/2022)  Depression (PHQ2-9): Medium Risk (08/11/2023)  Financial Resource Strain: Low Risk  (07/08/2022)  Physical Activity: Inactive (07/08/2022)  Social Connections: Socially Integrated (08/11/2023)  Stress: No Stress Concern Present (07/08/2022)  Tobacco Use: Low Risk  (08/11/2023)   SDOH Interventions:     Readmission Risk Interventions    08/12/2023   12:56 PM  Readmission Risk Prevention Plan  Medication Screening Complete  Transportation Screening Complete

## 2023-08-12 NOTE — Hospital Course (Addendum)
 Sandra Riley is a 63 y.o. female with medical history significant of hypertension, anxiety, depression, epilepsy who presents to the emergency department due to generalized weakness.  She complained of 1 week onset of sore throat with poor appetite and has had decreased food and fluid intake.  She endorsed not being able to keep any fluid down and feels dehydrated, she also told that she may have lost some weight.   ED Course:  In the emergency department, BP was 144/100, other vital signs were within normal range.  Workup in the ED showed normal CBC except for MCV of one 1.2 and platelets of 4 1.  BMP was significant for sodium of 125, chloride 93, bicarb of 14 AST 48, ALT 18, ALP 177, anion gap 18.  Urinalysis was positive for ketonuria.  Influenza A, B, SARS RSV, RSV was negative, group A strep by PCR was not detected. Chest x-ray showed no acute cardiopulmonary abnormality Patient was treated with Tylenol  and IV NS 1 L was     Assessment & Plan:   Principal Problem:   Hyponatremia Active Problems:   Anxiety and depression   Seizure (HCC)   Essential hypertension   Dizziness   Generalized weakness   High anion gap metabolic acidosis   Elevated MCV   Thrombocytosis   Transaminitis    Hyponatremia  Na 125 >>> 133  - Likely due to dehydration, (also on Keppra ) improved with IVF NS  -will continue gentle IV fluid hydration -Will monitor serum sodium level closely - Serum osmolality 291, serum sodium 125, urine osmolality 85 (low), urine sodium 11 Urine osmolality, serum osmolality and urine sodium will be checked   Generalized weakness in the setting of above Continue management as described above Continue fall precaution Continue PT/OT eval and treat   High anion gap metabolic acidosis Anion gap 18>> 9  - Lactic acid 0.8 patient with no history of T2DM Continue IV hydration    Sore throat Group A strep by PCR was negative Continue Cepacol   Elevated MCV MCV  101.2;  B12 normal at 469,   Thrombocytosis possibly reactive Platelets 401; continue to monitor platelets with morning labs   Mild transaminitis possibly due to shock liver ALT 48, ALP 177 Continue to monitor liver enzymes   Essential hypertension Continue home amlodipine , metoprolol    Seizure Continue Keppra , phenobarbital  Reducing dose of Keppra  due to hyponatremia   Dizziness Continue meclizine    Anxiety and depression Continue Klonopin  and Remero

## 2023-08-12 NOTE — Progress Notes (Signed)
 PROGRESS NOTE    Patient: Sandra Riley                            PCP: Antonetta Rollene BRAVO, MD                    DOB: 03-02-1960            DOA: 08/11/2023 FMW:[id number]             DOS: 08/12/2023, 12:49 PM   LOS: 1 day   Date of Service: The patient was seen and examined on 08/12/2023  Subjective:   The patient was seen and examined this morning. Hemodynamically stable. No issues overnight .  Brief Narrative:   Sandra Riley is a 63 y.o. female with medical history significant of hypertension, anxiety, depression, epilepsy who presents to the emergency department due to generalized weakness.  She complained of 1 week onset of sore throat with poor appetite and has had decreased food and fluid intake.  She endorsed not being able to keep any fluid down and feels dehydrated, she also told that she may have lost some weight.   ED Course:  In the emergency department, BP was 144/100, other vital signs were within normal range.  Workup in the ED showed normal CBC except for MCV of one 1.2 and platelets of 4 1.  BMP was significant for sodium of 125, chloride 93, bicarb of 14 AST 48, ALT 18, ALP 177, anion gap 18.  Urinalysis was positive for ketonuria.  Influenza A, B, SARS RSV, RSV was negative, group A strep by PCR was not detected. Chest x-ray showed no acute cardiopulmonary abnormality Patient was treated with Tylenol  and IV NS 1 L was     Assessment & Plan:   Principal Problem:   Hyponatremia Active Problems:   Anxiety and depression   Seizure (HCC)   Essential hypertension   Dizziness   Generalized weakness   High anion gap metabolic acidosis   Elevated MCV   Thrombocytosis   Transaminitis    Hyponatremia  Na 125 >>> 133  - Likely due to dehydration, (also on Keppra ) improved with IVF NS  -will continue gentle IV fluid hydration -Will monitor serum sodium level closely - Serum osmolality 291, serum sodium 125, urine osmolality 85 (low), urine sodium  11 Urine osmolality, serum osmolality and urine sodium will be checked   Generalized weakness in the setting of above Continue management as described above Continue fall precaution Continue PT/OT eval and treat   High anion gap metabolic acidosis Anion gap 18>> 9  - Lactic acid 0.8 patient with no history of T2DM Continue IV hydration    Sore throat Group A strep by PCR was negative Continue Cepacol   Elevated MCV MCV 101.2;  B12 normal at 469,   Thrombocytosis possibly reactive Platelets 401; continue to monitor platelets with morning labs   Mild transaminitis possibly due to shock liver ALT 48, ALP 177 Continue to monitor liver enzymes   Essential hypertension Continue home amlodipine , metoprolol    Seizure Continue Keppra , phenobarbital  Reducing dose of Keppra  due to hyponatremia   Dizziness Continue meclizine    Anxiety and depression Continue Klonopin  and Remero    ---------------------------------------------------------------------------------------------------------- Nutritional status:  The patient's BMI is: Body mass index is 18.87 kg/m. I agree with the assessment and plan as outlined  ------------------------------------------------------------------------------------------------------------------- DVT prophylaxis:  SCDs Start: 08/11/23 2234   Code Status:   Code  Status: Full Code  Family Communication: No family member present at bedside-  -Advance care planning has been discussed.   Admission status:   Status is: Inpatient Remains inpatient appropriate because: Needing IV fluid for hyponatremia, evaluation for generalized weakness   Disposition: From  - home             Planning for discharge in 1-2 days: to   Procedures:   No admission procedures for hospital encounter.   Antimicrobials:  Anti-infectives (From admission, onward)    None        Medication:   amLODipine   2.5 mg Oral Daily   Chlorhexidine  Gluconate Cloth  6  each Topical Q0600   clonazePAM   1 mg Oral BID   feeding supplement  237 mL Oral BID BM   levETIRAcetam   2,000 mg Oral BID   metoprolol  tartrate  25 mg Oral BID   mirtazapine   45 mg Oral QHS   PHENobarbital   194.4 mg Oral QHS    acetaminophen  **OR** acetaminophen , meclizine , menthol -cetylpyridinium, ondansetron  **OR** ondansetron  (ZOFRAN ) IV, phenol   Objective:   Vitals:   08/12/23 0729 08/12/23 0900 08/12/23 1000 08/12/23 1128  BP:  121/86 (!) 144/93   Pulse:      Resp:      Temp: (!) 97.5 F (36.4 C)   97.9 F (36.6 C)  TempSrc: Oral   Oral  SpO2:      Weight:      Height:        Intake/Output Summary (Last 24 hours) at 08/12/2023 1249 Last data filed at 08/12/2023 0347 Gross per 24 hour  Intake 151.83 ml  Output --  Net 151.83 ml   Filed Weights   08/11/23 1445 08/12/23 0145  Weight: 48.1 kg 46.8 kg     Physical examination:   Constitution:  Alert, cooperative, no distress,  Appears calm and comfortable  Psychiatric:   Normal and stable mood and affect, cognition intact,   HEENT:        Normocephalic, PERRL, otherwise with in Normal limits  Chest:         Chest symmetric Cardio vascular:  S1/S2, RRR, No murmure, No Rubs or Gallops  pulmonary: Clear to auscultation bilaterally, respirations unlabored, negative wheezes / crackles Abdomen: Soft, non-tender, non-distended, bowel sounds,no masses, no organomegaly Muscular skeletal:  Limited exam - in bed, global generalized - able to move all 4 extremities,   Neuro: CNII-XII intact. , normal motor and sensation, reflexes intact  Extremities: No pitting edema lower extremities, +2 pulses  Skin: Dry, warm to touch, negative for any Rashes, No open wounds Wounds: per nursing documentation   ------------------------------------------------------------------------------------------------------------------------------------------    LABs:     Latest Ref Rng & Units 08/12/2023    4:40 AM 08/11/2023    3:01 PM  02/11/2023    1:19 PM  CBC  WBC 4.0 - 10.5 K/uL 6.0  6.0  5.7   Hemoglobin 12.0 - 15.0 g/dL 86.9  85.9  85.5   Hematocrit 36.0 - 46.0 % 39.1  41.5  44.8   Platelets 150 - 400 K/uL 369  401  425       Latest Ref Rng & Units 08/12/2023    4:40 AM 08/11/2023    3:01 PM 07/14/2023    3:02 PM  CMP  Glucose 70 - 99 mg/dL 872  79  899   BUN 8 - 23 mg/dL <5  <5  2   Creatinine 0.44 - 1.00 mg/dL 9.45  9.41  9.44  Sodium 135 - 145 mmol/L 133  125  141   Potassium 3.5 - 5.1 mmol/L 3.5  3.9  3.2   Chloride 98 - 111 mmol/L 106  93  103   CO2 22 - 32 mmol/L 18  14  23    Calcium 8.9 - 10.3 mg/dL 8.4  9.0  9.2   Total Protein 6.5 - 8.1 g/dL 6.6  7.6  6.8   Total Bilirubin 0.0 - 1.2 mg/dL 0.8  1.0  0.2   Alkaline Phos 38 - 126 U/L 153  177  283   AST 15 - 41 U/L 51  48  33   ALT 0 - 44 U/L 20  18  22         Micro Results Recent Results (from the past 240 hours)  Resp panel by RT-PCR (RSV, Flu A&B, Covid) Anterior Nasal Swab     Status: None   Collection Time: 08/11/23  4:50 PM   Specimen: Anterior Nasal Swab  Result Value Ref Range Status   SARS Coronavirus 2 by RT PCR NEGATIVE NEGATIVE Final    Comment: (NOTE) SARS-CoV-2 target nucleic acids are NOT DETECTED.  The SARS-CoV-2 RNA is generally detectable in upper respiratory specimens during the acute phase of infection. The lowest concentration of SARS-CoV-2 viral copies this assay can detect is 138 copies/mL. A negative result does not preclude SARS-Cov-2 infection and should not be used as the sole basis for treatment or other patient management decisions. A negative result may occur with  improper specimen collection/handling, submission of specimen other than nasopharyngeal swab, presence of viral mutation(s) within the areas targeted by this assay, and inadequate number of viral copies(<138 copies/mL). A negative result must be combined with clinical observations, patient history, and epidemiological information. The expected  result is Negative.  Fact Sheet for Patients:  BloggerCourse.com  Fact Sheet for Healthcare Providers:  SeriousBroker.it  This test is no t yet approved or cleared by the [place]  FDA and  has been authorized for detection and/or diagnosis of SARS-CoV-2 by FDA under an Emergency Use Authorization (EUA). This EUA will remain  in effect (meaning this test can be used) for the duration of the COVID-19 declaration under Section 564(b)(1) of the Act, 21 U.S.C.section 360bbb-3(b)(1), unless the authorization is terminated  or revoked sooner.       Influenza A by PCR NEGATIVE NEGATIVE Final   Influenza B by PCR NEGATIVE NEGATIVE Final    Comment: (NOTE) The Xpert Xpress SARS-CoV-2/FLU/RSV plus assay is intended as an aid in the diagnosis of influenza from Nasopharyngeal swab specimens and should not be used as a sole basis for treatment. Nasal washings and aspirates are unacceptable for Xpert Xpress SARS-CoV-2/FLU/RSV testing.  Fact Sheet for Patients: BloggerCourse.com  Fact Sheet for Healthcare Providers: SeriousBroker.it  This test is not yet approved or cleared by the [place]  FDA and has been authorized for detection and/or diagnosis of SARS-CoV-2 by FDA under an Emergency Use Authorization (EUA). This EUA will remain in effect (meaning this test can be used) for the duration of the COVID-19 declaration under Section 564(b)(1) of the Act, 21 U.S.C. section 360bbb-3(b)(1), unless the authorization is terminated or revoked.     Resp Syncytial Virus by PCR NEGATIVE NEGATIVE Final    Comment: (NOTE) Fact Sheet for Patients: BloggerCourse.com  Fact Sheet for Healthcare Providers: SeriousBroker.it  This test is not yet approved or cleared by the [place]  FDA and has been authorized for detection and/or diagnosis of  SARS-CoV-2 by  FDA under an Emergency Use Authorization (EUA). This EUA will remain in effect (meaning this test can be used) for the duration of the COVID-19 declaration under Section 564(b)(1) of the Act, 21 U.S.C. section 360bbb-3(b)(1), unless the authorization is terminated or revoked.  Performed at [facility], [address], [place], [place] [address]   Group A Strep by PCR     Status: None   Collection Time: 08/11/23  6:40 PM   Specimen: Throat; Sterile Swab  Result Value Ref Range Status   Group A Strep by PCR NOT DETECTED NOT DETECTED Final    Comment: Performed at [facility], [address] [place], [place] [address]  MRSA Next Gen by PCR, Nasal     Status: None   Collection Time: 08/12/23  1:35 AM   Specimen: Nasal Mucosa; Nasal Swab  Result Value Ref Range Status   MRSA by PCR Next Gen NOT DETECTED NOT DETECTED Final    Comment: (NOTE) The GeneXpert MRSA Assay (FDA approved for NASAL specimens only), is one component of a comprehensive MRSA colonization surveillance program. It is not intended to diagnose MRSA infection nor to guide or monitor treatment for MRSA infections. Test performance is not FDA approved in patients less than 42 years old. Performed at [facility], [address], [place], [place] [address]     Radiology Reports DG Chest Portable 1 View Result Date: 08/11/2023 CLINICAL DATA:  malaise EXAM: PORTABLE CHEST - 1 VIEW COMPARISON:  January 29, 2022 FINDINGS: No focal airspace consolidation, pleural effusion, or pneumothorax. No cardiomegaly.No acute fracture or destructive lesion. Cholecystectomy clips. Vagal nerve stimulator device in the left chest with a single lead terminating in the neck. IMPRESSION: No acute cardiopulmonary abnormality. Electronically Signed   By: Rogelia Myers M.D.   On: 08/11/2023 18:04    SIGNED: Adriana DELENA Grams, MD, FHM. FAAFP. Jolynn Pack - [facility] Time spent - 55 min.  In seeing, evaluating and examining  the patient. Reviewing medical records, labs, drawn plan of care. [facility],  Pager (please use [email or web address] to page/ text) Please use Epic Secure Chat for non-urgent communication (7AM-7PM)  If 7PM-7AM, please contact night-coverage [email or web address], 08/12/2023, 12:49 PM

## 2023-08-13 DIAGNOSIS — E871 Hypo-osmolality and hyponatremia: Secondary | ICD-10-CM | POA: Diagnosis not present

## 2023-08-13 LAB — COMPREHENSIVE METABOLIC PANEL WITH GFR
ALT: 20 U/L (ref 0–44)
AST: 47 U/L — ABNORMAL HIGH (ref 15–41)
Albumin: 2.9 g/dL — ABNORMAL LOW (ref 3.5–5.0)
Alkaline Phosphatase: 145 U/L — ABNORMAL HIGH (ref 38–126)
Anion gap: 10 (ref 5–15)
BUN: <5 mg/dL — ABNORMAL LOW (ref 8–23)
CO2: 22 mmol/L (ref 22–32)
Calcium: 8.3 mg/dL — ABNORMAL LOW (ref 8.9–10.3)
Chloride: 107 mmol/L (ref 98–111)
Creatinine, Ser: 0.36 mg/dL — ABNORMAL LOW (ref 0.44–1.00)
GFR, Estimated: >60 mL/min (ref >60)
Glucose, Bld: 105 mg/dL — ABNORMAL HIGH (ref 70–99)
Potassium: 3 mmol/L — ABNORMAL LOW (ref 3.5–5.1)
Sodium: 139 mmol/L (ref 135–145)
Total Bilirubin: 0.7 mg/dL (ref 0.0–1.2)
Total Protein: 6.1 g/dL — ABNORMAL LOW (ref 6.5–8.1)

## 2023-08-13 MED ORDER — LEVETIRACETAM 500 MG PO TABS: 1000.0000 mg | ORAL_TABLET | Freq: Two times a day (BID) | ORAL | 0 refills | Status: DC

## 2023-08-13 MED ORDER — ENSURE PLUS HIGH PROTEIN PO LIQD: 237.0000 mL | Freq: Two times a day (BID) | ORAL | 0 refills | Status: AC

## 2023-08-13 MED ORDER — POTASSIUM CHLORIDE CRYS ER 20 MEQ PO TBCR
40.0000 meq | EXTENDED_RELEASE_TABLET | Freq: Once | ORAL | Status: AC
  Administered 2023-08-13: 40 meq via ORAL
  Filled 2023-08-13: qty 2

## 2023-08-13 NOTE — Plan of Care (Signed)
  Problem: Education: Goal: Knowledge of General Education information will improve Description: Including pain rating scale, medication(s)/side effects and non-pharmacologic comfort measures Outcome: Progressing   Problem: Clinical Measurements: Goal: Ability to maintain clinical measurements within normal limits will improve Outcome: Progressing   Problem: Safety: Goal: Ability to remain free from injury will improve Outcome: Progressing   

## 2023-08-13 NOTE — Discharge Summary (Signed)
 Physician Discharge Summary   Patient: Sandra Riley MRN: [id number] DOB: Jan 04, 1961  Admit date:     08/11/2023  Discharge date: 08/13/23  Discharge Physician: Adriana DELENA Grams   PCP: Antonetta Rollene BRAVO, MD   Recommendations at discharge:   Follow-up with the PCP in 1-2 weeks CMP in 5-7 days, results to PCP, monitor electrolytes Follow-up with neurologist in 1-2 weeks, continue adjusting Keppra  dose (may be contributing to her hyponatremia) Follow-up with home health, PT OT, fall precautions  Discharge Diagnoses: Principal Problem:   Hyponatremia Active Problems:   Anxiety and depression   Seizure (HCC)   Essential hypertension   Dizziness   Generalized weakness   High anion gap metabolic acidosis   Elevated MCV   Thrombocytosis   Transaminitis  Resolved Problems:   * No resolved hospital problems. *  Hospital Course: Sandra Riley is a 63 y.o. female with medical history significant of hypertension, anxiety, depression, epilepsy who presents to the emergency department due to generalized weakness.  She complained of 1 week onset of sore throat with poor appetite and has had decreased food and fluid intake.  She endorsed not being able to keep any fluid down and feels dehydrated, she also told that she may have lost some weight.   ED Course:  In the emergency department, BP was 144/100, other vital signs were within normal range.  Workup in the ED showed normal CBC except for MCV of one 1.2 and platelets of 4 1.  BMP was significant for sodium of 125, chloride 93, bicarb of 14 AST 48, ALT 18, ALP 177, anion gap 18.  Urinalysis was positive for ketonuria.  Influenza A, B, SARS RSV, RSV was negative, group A strep by PCR was not detected. Chest x-ray showed no acute cardiopulmonary abnormality Patient was treated with Tylenol  and IV NS 1 L was   Hyponatremia  Na 125 >>> 133 >>139 - Likely due to dehydration, (also on Keppra ) -Status post IV fluid  resuscitation -Much improved sodium levels  - Serum osmolality 291, serum sodium 125, urine osmolality 85 (low), urine sodium 11 Urine osmolality, serum osmolality and urine sodium will be checked   Generalized weakness in the setting of above Continue management as described above Continue fall precaution Continue PT/OT eval and treat-Home health PT has been arranged   High anion gap metabolic acidosis Resolved Anion gap 18>> 9  - Lactic acid 0.8 patient with no history of T2DM S/p IV hydration    Sore throat Group A strep by PCR was negative Continue Cepacol   Elevated MCV MCV 101.2;  B12 normal at 469,   Thrombocytosis possibly reactive Platelets 401; continue to monitor platelets with morning labs   Mild transaminitis possibly due to shock liver ALT 48, ALP 177 Continue to monitor liver enzymes   Essential hypertension Continue home amlodipine , metoprolol    Seizure Continue Keppra , phenobarbital  Reducing dose of Keppra  due to hyponatremia   Dizziness Continue meclizine -discontinued as it may be contributing to her generalized weaknesses, increased risk of falls   Anxiety and depression Continue Klonopin  and Remero   Disposition: Home health Diet recommendation:  Discharge Diet Orders (From admission, onward)     Start     Ordered   08/13/23 0000  Diet general        08/13/23 1015           Regular diet DISCHARGE MEDICATION: Allergies as of 08/13/2023       Reactions   Aspirin Other (See Comments)  Allergic to non coated ASA Caused gastric ulcers   Nsaids Other (See Comments)   Directed by MD to not take.    Singulair  [montelukast  Sodium] Nausea Only, Other (See Comments)   Became sweaty        Medication List     STOP taking these medications    levocetirizine 5 MG tablet Commonly known as: XYZAL    meclizine  25 MG tablet Commonly known as: ANTIVERT        TAKE these medications    acetaminophen  500 MG tablet Commonly known  as: TYLENOL  Take 1,000 mg by mouth every 6 (six) hours as needed for moderate pain.   alendronate  35 MG tablet Commonly known as: FOSAMAX  TAKE 1 TABLET BY MOUTH ONCE WEEKLY IN THE MORNING with full GLASS of water  ON an EMPTY stomach DO not lay down FOR 30 mins   amLODipine  2.5 MG tablet Commonly known as: NORVASC  TAKE 1 TABLET BY MOUTH DAILY   clonazePAM  1 MG tablet Commonly known as: KLONOPIN  Take 1 tablet (1 mg total) by mouth 2 (two) times daily.   feeding supplement Liqd Take 237 mLs by mouth 2 (two) times daily between meals.   fluticasone  50 MCG/ACT nasal spray Commonly known as: FLONASE  Place 2 sprays into both nostrils daily.   Humidifier Misc Large humidifier.   levETIRAcetam  500 MG tablet Commonly known as: KEPPRA  Take 2 tablets (1,000 mg total) by mouth 2 (two) times daily. What changed:  medication strength how much to take   metoprolol  tartrate 25 MG tablet Commonly known as: LOPRESSOR  TAKE 1 TABLET BY MOUTH TWICE DAILY   mirtazapine  45 MG tablet Commonly known as: REMERON  TAKE 1 TABLET BY MOUTH AT BEDTIME   multivitamin tablet Take 1 tablet by mouth in the morning.   PHENObarbital  100 MG tablet Commonly known as: LUMINAL Take 2 tablets (200 mg total) by mouth at bedtime.   VICKS VAPORUB EX Apply 1 Application topically as needed (congestion.).        Discharge Exam: Filed Weights   08/11/23 1445 08/12/23 0145  Weight: 48.1 kg 46.8 kg        General:  AAO x 3,  cooperative, no distress;   HEENT:  Normocephalic, PERRL, otherwise with in Normal limits   Neuro:  CNII-XII intact. , normal motor and sensation, reflexes intact   Lungs:   Clear to auscultation BL, Respirations unlabored,  No wheezes / crackles  Cardio:    S1/S2, RRR, No murmure, No Rubs or Gallops   Abdomen:  Soft, non-tender, bowel sounds active all four quadrants, no guarding or peritoneal signs.  Muscular  skeletal:  Limited exam -global generalized weaknesses - in  bed, able to move all 4 extremities,   2+ pulses,  symmetric, No pitting edema  Skin:  Dry, warm to touch, negative for any Rashes,  Wounds: Please see nursing documentation      Condition at discharge: good  The results of significant diagnostics from this hospitalization (including imaging, microbiology, ancillary and laboratory) are listed below for reference.   Imaging Studies: DG Chest Portable 1 View Result Date: 08/11/2023 CLINICAL DATA:  malaise EXAM: PORTABLE CHEST - 1 VIEW COMPARISON:  January 29, 2022 FINDINGS: No focal airspace consolidation, pleural effusion, or pneumothorax. No cardiomegaly.No acute fracture or destructive lesion. Cholecystectomy clips. Vagal nerve stimulator device in the left chest with a single lead terminating in the neck. IMPRESSION: No acute cardiopulmonary abnormality. Electronically Signed   By: Rogelia Myers M.D.   On: 08/11/2023 18:04  Microbiology: Results for orders placed or performed during the hospital encounter of 08/11/23  Resp panel by RT-PCR (RSV, Flu A&B, Covid) Anterior Nasal Swab     Status: None   Collection Time: 08/11/23  4:50 PM   Specimen: Anterior Nasal Swab  Result Value Ref Range Status   SARS Coronavirus 2 by RT PCR NEGATIVE NEGATIVE Final    Comment: (NOTE) SARS-CoV-2 target nucleic acids are NOT DETECTED.  The SARS-CoV-2 RNA is generally detectable in upper respiratory specimens during the acute phase of infection. The lowest concentration of SARS-CoV-2 viral copies this assay can detect is 138 copies/mL. A negative result does not preclude SARS-Cov-2 infection and should not be used as the sole basis for treatment or other patient management decisions. A negative result may occur with  improper specimen collection/handling, submission of specimen other than nasopharyngeal swab, presence of viral mutation(s) within the areas targeted by this assay, and inadequate number of viral copies(<138 copies/mL). A negative  result must be combined with clinical observations, patient history, and epidemiological information. The expected result is Negative.  Fact Sheet for Patients:  BloggerCourse.com  Fact Sheet for Healthcare Providers:  SeriousBroker.it  This test is no t yet approved or cleared by the [place]  FDA and  has been authorized for detection and/or diagnosis of SARS-CoV-2 by FDA under an Emergency Use Authorization (EUA). This EUA will remain  in effect (meaning this test can be used) for the duration of the COVID-19 declaration under Section 564(b)(1) of the Act, 21 U.S.C.section 360bbb-3(b)(1), unless the authorization is terminated  or revoked sooner.       Influenza A by PCR NEGATIVE NEGATIVE Final   Influenza B by PCR NEGATIVE NEGATIVE Final    Comment: (NOTE) The Xpert Xpress SARS-CoV-2/FLU/RSV plus assay is intended as an aid in the diagnosis of influenza from Nasopharyngeal swab specimens and should not be used as a sole basis for treatment. Nasal washings and aspirates are unacceptable for Xpert Xpress SARS-CoV-2/FLU/RSV testing.  Fact Sheet for Patients: BloggerCourse.com  Fact Sheet for Healthcare Providers: SeriousBroker.it  This test is not yet approved or cleared by the [place]  FDA and has been authorized for detection and/or diagnosis of SARS-CoV-2 by FDA under an Emergency Use Authorization (EUA). This EUA will remain in effect (meaning this test can be used) for the duration of the COVID-19 declaration under Section 564(b)(1) of the Act, 21 U.S.C. section 360bbb-3(b)(1), unless the authorization is terminated or revoked.     Resp Syncytial Virus by PCR NEGATIVE NEGATIVE Final    Comment: (NOTE) Fact Sheet for Patients: BloggerCourse.com  Fact Sheet for Healthcare Providers: SeriousBroker.it  This  test is not yet approved or cleared by the [place]  FDA and has been authorized for detection and/or diagnosis of SARS-CoV-2 by FDA under an Emergency Use Authorization (EUA). This EUA will remain in effect (meaning this test can be used) for the duration of the COVID-19 declaration under Section 564(b)(1) of the Act, 21 U.S.C. section 360bbb-3(b)(1), unless the authorization is terminated or revoked.  Performed at [facility], [address], [place], [place] [address]   Group A Strep by PCR     Status: None   Collection Time: 08/11/23  6:40 PM   Specimen: Throat; Sterile Swab  Result Value Ref Range Status   Group A Strep by PCR NOT DETECTED NOT DETECTED Final    Comment: Performed at [facility], [address] [place], [place] [address]  MRSA Next Gen by PCR, Nasal  Status: None   Collection Time: 08/12/23  1:35 AM   Specimen: Nasal Mucosa; Nasal Swab  Result Value Ref Range Status   MRSA by PCR Next Gen NOT DETECTED NOT DETECTED Final    Comment: (NOTE) The GeneXpert MRSA Assay (FDA approved for NASAL specimens only), is one component of a comprehensive MRSA colonization surveillance program. It is not intended to diagnose MRSA infection nor to guide or monitor treatment for MRSA infections. Test performance is not FDA approved in patients less than 39 years old. Performed at [facility], [address] [place], [place] [address]     Labs: CBC: Recent Labs  Lab 08/11/23 1501 08/12/23 0440  WBC 6.0 6.0  NEUTROABS 2.3  --   HGB 14.0 13.0  HCT 41.5 39.1  MCV 101.2* 100.0  PLT 401* 369   Basic Metabolic Panel: Recent Labs  Lab 08/11/23 1501 08/12/23 0440 08/13/23 0326  NA 125* 133* 139  K 3.9 3.5 3.0*  CL 93* 106 107  CO2 14* 18* 22  GLUCOSE 79 127* 105*  BUN <5* <5* <5*  CREATININE 0.58 0.54 0.36*  CALCIUM 9.0 8.4* 8.3*  MG  --  2.1  --   PHOS  --  1.6*  --    Liver Function Tests: Recent Labs  Lab 08/11/23 1501 08/12/23 0440 08/13/23 0326   AST 48* 51* 47*  ALT 18 20 20   ALKPHOS 177* 153* 145*  BILITOT 1.0 0.8 0.7  PROT 7.6 6.6 6.1*  ALBUMIN 3.6 3.1* 2.9*   CBG: No results for input(s): GLUCAP in the last 168 hours.  Discharge time spent: greater than 30 minutes.  Signed: Adriana DELENA Grams, MD [facility] 08/13/2023

## 2023-08-13 NOTE — Progress Notes (Signed)
 Patient discharged home today, transported home by husband. Belongings sent home with patient. Discharge summary went over with patient and spouse, both verbalized understanding.

## 2023-08-13 NOTE — TOC Transition Note (Signed)
 Transition of Care [facility]) - Discharge Note  Patient Details  Name: Sandra Riley MRN: [id number] Date of Birth: 1960/04/21  [facility] CM/SW Contact:  Nena LITTIE Coffee, RN Phone Number: 08/13/2023, 10:46 AM  Clinical Narrative:    Pt to dc home c/[facility] PT & OT. [facility] at [facility] has been updated.   Final next level of care: Home/Self Care Barriers to Discharge: Barriers Resolved  Patient Goals and CMS Choice Patient states their goals for this hospitalization and ongoing recovery are:: return back home CMS Medicare.gov Compare Post Acute Care list provided to:: Patient Choice offered to / list presented to : Patient    Discharge Placement   Discharge Plan and Services Additional resources added to the After Visit Summary for   In-house Referral: Clinical Social Work   Post Acute Care Choice: Durable Medical Equipment              HH Arranged: PT, OT HH Agency: [facility] Date [facility] Agency Contacted: 08/12/23 Time HH Agency Contacted: 1321 Representative spoke with at [place] Agency: [facility]  Social Drivers of Health (SDOH) Interventions SDOH Screenings   Food Insecurity: No Food Insecurity (08/11/2023)  Housing: Low Risk  (08/11/2023)  Transportation Needs: No Transportation Needs (08/11/2023)  Utilities: Not At Risk (08/11/2023)  Alcohol Screen: Low Risk  (07/08/2022)  Depression (PHQ2-9): Medium Risk (08/11/2023)  Financial Resource Strain: Low Risk  (07/08/2022)  Physical Activity: Inactive (07/08/2022)  Social Connections: Socially Integrated (08/11/2023)  Stress: No Stress Concern Present (07/08/2022)  Tobacco Use: Low Risk  (08/11/2023)   Readmission Risk Interventions    08/12/2023   12:56 PM  Readmission Risk Prevention Plan  Medication Screening Complete  Transportation Screening Complete

## 2023-08-15 ENCOUNTER — Telehealth: Payer: Self-pay

## 2023-08-15 ENCOUNTER — Telehealth: Payer: Self-pay | Admitting: Neurology

## 2023-08-15 NOTE — Telephone Encounter (Signed)
 Pt is asking for a call to discuss when Dr Gregg will cut off her VNS so she is able to have her mammogram on Wednesday this week, please call to discuss.

## 2023-08-15 NOTE — Transitions of Care (Post Inpatient/ED Visit) (Cosign Needed)
 08/15/2023  Name: Sandra Riley MRN: [id number] DOB: 1960/08/19  Today's TOC FU Call Status: Today's TOC FU Call Status:: Successful TOC FU Call Completed TOC FU Call Complete Date: 08/15/23 Patient's Name and Date of Birth confirmed.  Transition Care Management Follow-up Telephone Call Date of Discharge: 08/13/23 [facility] Type of Discharge: Inpatient Admission Primary Inpatient Discharge Diagnosis:: hypo-osmolality, hyponatremia, weakness How have you been since you were released from the hospital?: Better Any questions or concerns?: No  Items Reviewed: Did you receive and understand the discharge instructions provided?: Yes Medications obtained,verified, and reconciled?: Yes (Medications Reviewed) Any new allergies since your discharge?: No Dietary orders reviewed?: Yes Type of Diet Ordered:: regular diet Do you have support at home?: Yes People in Home [RPT]: spouse Name of Support/Comfort Primary Source: Terry Stranahan  Medications Reviewed Today: Medications Reviewed Today     Reviewed by Allyson Pleasant, CMA (Certified Medical Assistant) on 08/15/23 at [phone]  Med List Status: <None>   Medication Order Taking? Sig Documenting Provider Last Dose Status Informant  acetaminophen  (TYLENOL ) 500 MG tablet [id number] Yes Take 1,000 mg by mouth every 6 (six) hours as needed for moderate pain. [provider]  Active Self, Pharmacy Records  alendronate  (FOSAMAX ) 35 MG tablet [id number] Yes TAKE 1 TABLET BY MOUTH ONCE WEEKLY IN THE MORNING with full GLASS of water  ON an EMPTY stomach DO not lay down FOR 30 mins Antonetta Rollene BRAVO, MD  Active Self, Pharmacy Records  amLODipine  (NORVASC ) 2.5 MG tablet [id number] Yes TAKE 1 TABLET BY MOUTH DAILY Mallipeddi, Diannah SQUIBB, MD  Active Self, Pharmacy Records  Camphor-Eucalyptus-Menthol  [facility] EX) [id number] Yes Apply 1 Application topically as needed (congestion.). [provider]  Active Self,  Pharmacy Records  clonazePAM  (KLONOPIN ) 1 MG tablet [id number] Yes Take 1 tablet (1 mg total) by mouth 2 (two) times daily. Antonetta Rollene BRAVO, MD  Active Self, Pharmacy Records  feeding supplement (ENSURE PLUS HIGH PROTEIN) LIQD [id number] Yes Take 237 mLs by mouth 2 (two) times daily between meals. Willette Adriana LABOR, MD  Active   fluticasone  (FLONASE ) 50 MCG/ACT nasal spray [id number] Yes Place 2 sprays into both nostrils daily. Melvenia Manus BRAVO, MD  Active Self, Pharmacy Records  Humidifier MISC [id number] Yes Large humidifier. Antonetta Rollene BRAVO, MD  Active Self, Pharmacy Records  levETIRAcetam  (KEPPRA ) 500 MG tablet [id number] Yes Take 2 tablets (1,000 mg total) by mouth 2 (two) times daily. Shahmehdi, Seyed A, MD  Active   metoprolol  tartrate (LOPRESSOR ) 25 MG tablet [id number] Yes TAKE 1 TABLET BY MOUTH TWICE DAILY Mallipeddi, Vishnu P, MD  Active Self, Pharmacy Records  mirtazapine  (REMERON ) 45 MG tablet [id number] Yes TAKE 1 TABLET BY MOUTH AT BEDTIME Antonetta Rollene BRAVO, MD  Active Self, Pharmacy Records  Multiple Vitamin (MULTIVITAMIN) tablet [id number] Yes Take 1 tablet by mouth in the morning. [provider]  Active Self, Pharmacy Records  PHENObarbital  (LUMINAL) 100 MG tablet [id number] Yes Take 2 tablets (200 mg total) by mouth at bedtime. Onita Duos, MD  Active Self, Pharmacy Records            Home Care and Equipment/Supplies: Were Home Health Services Ordered?: Yes Name of Home Health Agency:: not listed Has Agency set up a time to come to your home?: No EMR reviewed for Home Health Orders: Orders present/patient has not received call (refer to CM for follow-up) Any new equipment or medical supplies ordered?: No  Functional Questionnaire: Do you need assistance with bathing/showering or dressing?: Yes  Do you need assistance with meal preparation?: Yes Do you need assistance with eating?: No Do you have difficulty maintaining continence: No Do you need assistance  with getting out of bed/getting out of a chair/moving?: No Do you have difficulty managing or taking your medications?: No  Follow up appointments reviewed: PCP Follow-up appointment confirmed?: Yes Date of PCP follow-up appointment?: 08/17/23 Follow-up Provider: Leita Longs FNP Specialist Hospital Follow-up appointment confirmed?: Yes Date of Specialist follow-up appointment?: 09/13/23 Follow-Up Specialty Provider:: Pastor Falling (sending message requesting sooner appt due to Keppra  adjustments during hospitalization) Do you need transportation to your follow-up appointment?: Yes Transportation Need Intervention Addressed By:: Other: (patient is able to get transportation services through her insurance for appts. She will call them to get transportation to appt to neurologist) Do you understand care options if your condition(s) worsen?: Yes-patient verbalized understanding   Stefano ORN, CMA  [facility] AWV Team Direct Dial: [phone]

## 2023-08-15 NOTE — Telephone Encounter (Signed)
 Call to patient, advised VNS does not need to be turned off for mammogram. Verified with Megan M with VNS as well.   Also was notified patient was in hospital recently and they changed her Keppra . Advised I will send to Dr. Gregg for review.

## 2023-08-17 ENCOUNTER — Ambulatory Visit

## 2023-08-17 ENCOUNTER — Ambulatory Visit (HOSPITAL_COMMUNITY)
Admission: RE | Admit: 2023-08-17 | Discharge: 2023-08-17 | Disposition: A | Discharge status: Home / Self Care | Source: Ambulatory Visit | Attending: Family Medicine | Admitting: Family Medicine

## 2023-08-17 VITALS — BP 113/74 | HR 82 | Ht 62.0 in | Wt 107.0 lb

## 2023-08-17 DIAGNOSIS — E871 Hypo-osmolality and hyponatremia: Secondary | ICD-10-CM

## 2023-08-17 DIAGNOSIS — J029 Acute pharyngitis, unspecified: Secondary | ICD-10-CM

## 2023-08-17 DIAGNOSIS — Z1231 Encounter for screening mammogram for malignant neoplasm of breast: Secondary | ICD-10-CM | POA: Diagnosis not present

## 2023-08-17 DIAGNOSIS — R296 Repeated falls: Secondary | ICD-10-CM | POA: Diagnosis not present

## 2023-08-17 DIAGNOSIS — G40209 Localization-related (focal) (partial) symptomatic epilepsy and epileptic syndromes with complex partial seizures, not intractable, without status epilepticus: Secondary | ICD-10-CM

## 2023-08-17 MED ORDER — AMOXICILLIN 500 MG PO CAPS: 500.0000 mg | ORAL_CAPSULE | Freq: Three times a day (TID) | ORAL | 0 refills | Status: AC

## 2023-08-17 NOTE — Progress Notes (Signed)
 Established Patient Office Visit  Subjective   Patient ID: Sandra Riley, female    DOB: Mar 15, 1960  Age: 63 y.o. MRN: [id number]  Chief Complaint  Patient presents with   Medical Management of Chronic Issues    Pt here for hosptial follow up. Also states needs a CMP and a homehealth refferal for pt/ot fall risk percautions     HPI Pt is following up from recent hospitalization at [facility] from 7/10-7/12/25 for the following:    Hyponatremia  Na 125 >>> 133 >>139 - Likely due to dehydration, (also on Keppra ) -Status post IV fluid resuscitation -Much improved sodium levels  - Serum osmolality 291, serum sodium 125, urine osmolality 85 (low), urine sodium 11 Urine osmolality, serum osmolality and urine sodium will be checked   Generalized weakness in the setting of above Continue management as described above Continue fall precaution Continue PT/OT eval and treat-Home health PT has been arranged   High anion gap metabolic acidosis Resolved Anion gap 18>> 9  - Lactic acid 0.8 patient with no history of T2DM S/p IV hydration    Sore throat Group A strep by PCR was negative Continue Cepacol   Elevated MCV MCV 101.2;  B12 normal at 469,   Thrombocytosis possibly reactive Platelets 401; continue to monitor platelets with morning labs   Mild transaminitis possibly due to shock liver ALT 48, ALP 177 Continue to monitor liver enzymes   Essential hypertension Continue home amlodipine , metoprolol    Seizure Continue Keppra , phenobarbital  Reducing dose of Keppra  due to hyponatremia   Dizziness Continue meclizine -discontinued as it may be contributing to her generalized weaknesses, increased risk of falls   Anxiety and depression Continue Klonopin  and Remero   Recommendations at discharge:  Follow-up with the PCP in 1-2 weeks CMP in 5-7 days, results to PCP, monitor electrolytes Follow-up with neurologist in 1-2 weeks, continue adjusting Keppra  dose (may be  contributing to her hyponatremia) Follow-up with home health, PT OT, fall precautions  Patient Active Problem List   Diagnosis Date Noted   Hyponatremia 08/11/2023   Falls 08/11/2023   Drowsiness 08/11/2023   Otalgia, left ear 08/11/2023   Weight loss 08/11/2023   Generalized weakness 08/11/2023   High anion gap metabolic acidosis 08/11/2023   Elevated MCV 08/11/2023   Thrombocytosis 08/11/2023   Transaminitis 08/11/2023   Environmental allergies 07/26/2023   Dizziness 07/14/2023   Maxillary sinusitis 06/30/2023   Encounter for screening mammogram for malignant neoplasm of breast 06/30/2023   Acute non-recurrent frontal sinusitis 06/22/2023   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   Viral illness 04/22/2023   Encounter for Medicare annual examination with abnormal findings 12/27/2022   Unsteady gait 12/27/2022   Compression fracture of L4 lumbar vertebra, sequela 12/27/2022   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   Elevated alkaline phosphatase level 10/06/2022   Palpitations 08/31/2022   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   Murmur, cardiac 05/20/2022   Tachycardia 05/16/2022   Nausea 05/16/2022   Femoral neck fracture (HCC) 02/25/2022   Displaced fracture of base of neck of left femur, initial encounter for closed fracture (HCC) 01/26/2022   Depression, major, single episode, severe (HCC) 04/12/2021   Grief at loss of child 04/10/2021   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   Essential hypertension 12/02/2019   Osteoporosis without current pathological fracture 04/27/2019   Vitamin D  deficiency 12/20/2010   Anxiety and depression 07/08/2009   ALLERGIC RHINITIS, SEASONAL 09/04/2007   Moderate mood  disorder (HCC) 02/21/2007   Seizure (HCC) 02/21/2007   Mixed insomnia 02/21/2007      ROS    Objective:     BP 113/74   Pulse 82   Ht 5' 2 (1.575 m)   Wt 107 lb 0.6 oz  (48.6 kg)   SpO2 96%   BMI 19.58 kg/m  BP Readings from Last 3 Encounters:  08/17/23 113/74  08/13/23 (!) 142/83  08/11/23 121/82   Wt Readings from Last 3 Encounters:  08/17/23 107 lb 0.6 oz (48.6 kg)  08/12/23 103 lb 2.8 oz (46.8 kg)  08/11/23 106 lb (48.1 kg)     Physical Exam Vitals and nursing note reviewed. Exam conducted with a chaperone present (pt's husband).  Constitutional:      Appearance: Normal appearance.  HENT:     Head: Normocephalic and atraumatic.     Right Ear: Tympanic membrane, ear canal and external ear normal.     Left Ear: Tympanic membrane, ear canal and external ear normal.     Nose: Nose normal.     Mouth/Throat:     Mouth: Mucous membranes are moist.     Pharynx: Oropharynx is clear.  Eyes:     Extraocular Movements: Extraocular movements intact.     Pupils: Pupils are equal, round, and reactive to light.  Cardiovascular:     Rate and Rhythm: Normal rate and regular rhythm.  Pulmonary:     Effort: Pulmonary effort is normal.     Breath sounds: Normal breath sounds.  Abdominal:     General: Abdomen is flat. Bowel sounds are normal.  Musculoskeletal:        General: Normal range of motion.     Cervical back: Normal range of motion and neck supple.  Skin:    General: Skin is warm and dry.  Neurological:     General: No focal deficit present.     Mental Status: She is alert and oriented to person, place, and time.  Psychiatric:        Mood and Affect: Mood normal.        Thought Content: Thought content normal.       The 10-year ASCVD risk score (Arnett DK, et al., 2019) is: 5.1%    Assessment & Plan:   Problem List Items Addressed This Visit       Nervous and Auditory   Partial epilepsy with impairment of consciousness (HCC) - Primary   Recent hospitalization records reviewed.   She has an upcoming appointment with neurology on 08/30/2023.   Order for home health was placed for PT/OT to help with fall prevention and  precautions.       Relevant Orders   Ambulatory referral to Home Health     Other   Hyponatremia   Recheck  BMP today.        Relevant Orders   CMP14+EGFR (Completed)   Falls   Recent hospitalization records reviewed.   She has an upcoming appointment with neurology on 08/30/2023.   Order for home health was placed for PT/OT to help with fall prevention and precautions.      Relevant Orders   Ambulatory referral to Home Health   Other Visit Diagnoses       Pharyngitis, unspecified etiology       Relevant Medications   amoxicillin  (AMOXIL ) 500 MG capsule       No follow-ups on file.    Leita Longs, FNP

## 2023-08-17 NOTE — Patient Instructions (Signed)
 Recommend Cepacol sore throat lozenges for throat pain.   Amoxicillin  sent to [facility].

## 2023-08-18 NOTE — Telephone Encounter (Signed)
 Will monitor her seizures frequency since they decreased the Keppra . I will see her as schedule on 7/29

## 2023-08-19 DIAGNOSIS — E8729 Other acidosis: Secondary | ICD-10-CM | POA: Diagnosis not present

## 2023-08-19 DIAGNOSIS — E871 Hypo-osmolality and hyponatremia: Secondary | ICD-10-CM | POA: Diagnosis not present

## 2023-08-19 DIAGNOSIS — R296 Repeated falls: Secondary | ICD-10-CM | POA: Diagnosis not present

## 2023-08-19 DIAGNOSIS — H9202 Otalgia, left ear: Secondary | ICD-10-CM | POA: Diagnosis not present

## 2023-08-19 DIAGNOSIS — I10 Essential (primary) hypertension: Secondary | ICD-10-CM | POA: Diagnosis not present

## 2023-08-19 DIAGNOSIS — Z96642 Presence of left artificial hip joint: Secondary | ICD-10-CM | POA: Diagnosis not present

## 2023-08-19 DIAGNOSIS — G43909 Migraine, unspecified, not intractable, without status migrainosus: Secondary | ICD-10-CM | POA: Diagnosis not present

## 2023-08-19 DIAGNOSIS — G4700 Insomnia, unspecified: Secondary | ICD-10-CM | POA: Diagnosis not present

## 2023-08-19 DIAGNOSIS — M810 Age-related osteoporosis without current pathological fracture: Secondary | ICD-10-CM | POA: Diagnosis not present

## 2023-08-19 DIAGNOSIS — J320 Chronic maxillary sinusitis: Secondary | ICD-10-CM | POA: Diagnosis not present

## 2023-08-19 DIAGNOSIS — Z79899 Other long term (current) drug therapy: Secondary | ICD-10-CM | POA: Diagnosis not present

## 2023-08-19 DIAGNOSIS — Z7983 Long term (current) use of bisphosphonates: Secondary | ICD-10-CM | POA: Diagnosis not present

## 2023-08-22 ENCOUNTER — Other Ambulatory Visit: Payer: Self-pay

## 2023-08-22 ENCOUNTER — Ambulatory Visit: Payer: Self-pay

## 2023-08-22 ENCOUNTER — Telehealth: Payer: Self-pay

## 2023-08-22 DIAGNOSIS — E871 Hypo-osmolality and hyponatremia: Secondary | ICD-10-CM

## 2023-08-22 NOTE — Assessment & Plan Note (Signed)
 Recheck BMP today.

## 2023-08-22 NOTE — Assessment & Plan Note (Signed)
 Recent hospitalization records reviewed.   She has an upcoming appointment with neurology on 08/30/2023.   Order for home health was placed for PT/OT to help with fall prevention and precautions.

## 2023-08-22 NOTE — Telephone Encounter (Signed)
 Verbal orders given to [facility]

## 2023-08-22 NOTE — Telephone Encounter (Signed)
 Copied from CRM [id number]. Topic: Clinical - Home Health Verbal Orders >> Aug 22, 2023 10:42 AM Emylou G wrote: Caller/Agency: Hadassah w/[facility] Callback Number: [phone] secured line Service Requested: Physical Therapy Frequency: 1w9  Any new concerns about the patient? No

## 2023-08-23 LAB — CMP14+EGFR
ALT: 23 IU/L (ref 0–32)
AST: 57 IU/L — ABNORMAL HIGH (ref 0–40)
Albumin: 3.8 g/dL — ABNORMAL LOW (ref 3.9–4.9)
Alkaline Phosphatase: 198 IU/L — ABNORMAL HIGH (ref 44–121)
BUN/Creatinine Ratio: 24 (ref 12–28)
BUN: 12 mg/dL (ref 8–27)
Bilirubin Total: 0.2 mg/dL (ref 0.0–1.2)
CO2: 15 mmol/L — AB (ref 20–29)
Calcium: 7.9 mg/dL — ABNORMAL LOW (ref 8.7–10.3)
Chloride: 97 mmol/L (ref 96–106)
Creatinine, Ser: 0.5 mg/dL — ABNORMAL LOW (ref 0.57–1.00)
Globulin, Total: 2.8 g/dL (ref 1.5–4.5)
Glucose: 107 mg/dL — ABNORMAL HIGH (ref 70–99)
Potassium: 4.5 mmol/L (ref 3.5–5.2)
Sodium: 131 mmol/L — ABNORMAL LOW (ref 134–144)
Total Protein: 6.6 g/dL (ref 6.0–8.5)
eGFR: 105 mL/min/1.73 (ref >59)

## 2023-08-24 DIAGNOSIS — E871 Hypo-osmolality and hyponatremia: Secondary | ICD-10-CM | POA: Diagnosis not present

## 2023-08-24 DIAGNOSIS — E8729 Other acidosis: Secondary | ICD-10-CM | POA: Diagnosis not present

## 2023-08-24 DIAGNOSIS — I10 Essential (primary) hypertension: Secondary | ICD-10-CM | POA: Diagnosis not present

## 2023-08-24 DIAGNOSIS — H9202 Otalgia, left ear: Secondary | ICD-10-CM | POA: Diagnosis not present

## 2023-08-24 DIAGNOSIS — G4700 Insomnia, unspecified: Secondary | ICD-10-CM | POA: Diagnosis not present

## 2023-08-24 DIAGNOSIS — R296 Repeated falls: Secondary | ICD-10-CM | POA: Diagnosis not present

## 2023-08-24 DIAGNOSIS — J320 Chronic maxillary sinusitis: Secondary | ICD-10-CM | POA: Diagnosis not present

## 2023-08-24 DIAGNOSIS — Z7983 Long term (current) use of bisphosphonates: Secondary | ICD-10-CM | POA: Diagnosis not present

## 2023-08-24 DIAGNOSIS — M810 Age-related osteoporosis without current pathological fracture: Secondary | ICD-10-CM | POA: Diagnosis not present

## 2023-08-24 DIAGNOSIS — Z79899 Other long term (current) drug therapy: Secondary | ICD-10-CM | POA: Diagnosis not present

## 2023-08-24 DIAGNOSIS — Z96642 Presence of left artificial hip joint: Secondary | ICD-10-CM | POA: Diagnosis not present

## 2023-08-24 DIAGNOSIS — G43909 Migraine, unspecified, not intractable, without status migrainosus: Secondary | ICD-10-CM | POA: Diagnosis not present

## 2023-08-25 ENCOUNTER — Ambulatory Visit: Payer: Self-pay

## 2023-08-25 MED ORDER — ONDANSETRON HCL 4 MG PO TABS: ORAL_TABLET | ORAL | 0 refills | Status: DC

## 2023-08-25 NOTE — Telephone Encounter (Signed)
 FYI Only or Action Required?: Action required by provider: clinical question for provider and update on patient condition.  Patient was last seen in primary care on 08/17/2023 by Bevely Doffing, FNP.  Called Nurse Triage reporting Vomiting and history hyponatremia .  Symptoms began yesterday.  Interventions attempted: Rest, hydration, or home remedies.  Symptoms are: gradually improving.  Triage Disposition: Home Care  Patient/caregiver understands and will follow disposition?: No, wishes to speak with PCP   Message from Harlene ORN sent at 08/25/2023  1:18 PM EDT  Patient has been vomiting. Has had low sodium levels   Reason for Disposition  MILD to MODERATE vomiting (e.g., 1 - 5 times / day)  Answer Assessment - Initial Assessment Questions 1. VOMITING SEVERITY: How many times have you vomited in the past 24 hours?       2. ONSET: When did the vomiting begin?      yesterday 3. FLUIDS: What fluids or food have you vomited up today? Have you been able to keep any fluids down?     Drinking gatoraid 4. ABDOMEN PAIN: Are your having any abdomen pain? If Yes : How bad is it and what does it feel like? (e.g., crampy, dull, intermittent, constant)      Denies  5. DIARRHEA: Is there any diarrhea? If Yes, ask: How many times today?      Denies  6. CONTACTS: Is there anyone else in the family with the same symptoms?      No 7. CAUSE: What do you think is causing your vomiting?     Low sodium 8. HYDRATION STATUS: Any signs of dehydration? (e.g., dry mouth [not only dry lips], too weak to stand) When did you last urinate?     Not dehydrated 9. OTHER SYMPTOMS: Do you have any other symptoms? (e.g., fever, headache, vertigo, vomiting blood or coffee grounds, recent head injury)     Denies   Additional info: 1) Has follow up appointment scheduled 08/30/23.  2) History of hyponatremia. She is inquiring if she should take sodium supplement? Would like Dr. Kit  input. Please follow up call.  3) Reports at last visit provider suggested nasal wash but she is unable to afford it, wondering if she can use something else for seasonal allergies.  Protocols used: Vomiting-A-AH

## 2023-08-25 NOTE — Telephone Encounter (Signed)
 I  have prescribed a limited amt of zofran  for vomiting, if perists needs to go to ED, let her know pls

## 2023-08-25 NOTE — Telephone Encounter (Signed)
Pt informed

## 2023-08-25 NOTE — Addendum Note (Signed)
 Addended by: ANTONETTA ROLLENE BRAVO on: 08/25/2023 02:46 PM   Modules accepted: Orders

## 2023-08-26 ENCOUNTER — Telehealth: Payer: Self-pay

## 2023-08-26 ENCOUNTER — Other Ambulatory Visit: Payer: Self-pay

## 2023-08-26 MED ORDER — ONDANSETRON HCL 4 MG PO TABS: ORAL_TABLET | ORAL | 0 refills | Status: DC

## 2023-08-26 NOTE — Telephone Encounter (Signed)
 Copied from CRM [id number]. Topic: Clinical - Prescription Issue >> Aug 25, 2023  4:01 PM Tiffany S wrote: Reason for CRM: Pharmacy does not have prescription for zofran  please resend

## 2023-08-26 NOTE — Telephone Encounter (Signed)
 RESENT

## 2023-08-29 ENCOUNTER — Other Ambulatory Visit: Payer: Self-pay | Admitting: Neurology

## 2023-08-30 ENCOUNTER — Other Ambulatory Visit: Payer: Self-pay | Admitting: Neurology

## 2023-08-30 ENCOUNTER — Other Ambulatory Visit: Payer: Self-pay | Admitting: Internal Medicine

## 2023-08-30 ENCOUNTER — Encounter: Payer: Self-pay | Admitting: Neurology

## 2023-08-30 ENCOUNTER — Ambulatory Visit (INDEPENDENT_AMBULATORY_CARE_PROVIDER_SITE_OTHER): Admitting: Neurology

## 2023-08-30 VITALS — BP 137/85 | HR 83 | Resp 14 | Ht 62.0 in | Wt 106.5 lb

## 2023-08-30 DIAGNOSIS — Z5181 Encounter for therapeutic drug level monitoring: Secondary | ICD-10-CM

## 2023-08-30 DIAGNOSIS — E871 Hypo-osmolality and hyponatremia: Secondary | ICD-10-CM

## 2023-08-30 DIAGNOSIS — G40219 Localization-related (focal) (partial) symptomatic epilepsy and epileptic syndromes with complex partial seizures, intractable, without status epilepticus: Secondary | ICD-10-CM | POA: Diagnosis not present

## 2023-08-30 DIAGNOSIS — Z9689 Presence of other specified functional implants: Secondary | ICD-10-CM | POA: Diagnosis not present

## 2023-08-30 DIAGNOSIS — H8110 Benign paroxysmal vertigo, unspecified ear: Secondary | ICD-10-CM

## 2023-08-30 MED ORDER — LEVETIRACETAM 500 MG PO TABS: 2000.0000 mg | ORAL_TABLET | Freq: Two times a day (BID) | ORAL | 3 refills | Status: DC

## 2023-08-30 NOTE — Progress Notes (Signed)
 [facility]  PATIENT: Sandra Riley DOB: 11/19/1960  REQUESTING CLINICIAN: Antonetta Rollene BRAVO, MD HISTORY FROM: Patient, husband and chart review  REASON FOR VISIT: Seizure disorder follow up    HISTORICAL  CHIEF COMPLAINT:  Chief Complaint  Patient presents with   Seizures    Rm13, husband present, Sz:pt stated that she would like to have keppra  raised back because since lowered feels like more frequent ha's, can't sleep well, loss of appetitive, sore throat x's several months and left ear pain   INTERVAL HISTORY 08/30/2023 Sandra Riley presents today for follow-up, she is accompanied by her husband.  Last visit was in May, at that time we continued her medications, and increased her VNS settings.  Since then, she told me she was was admitted to the hospital for hyponatremia.  However, she was told hyponatremia likely related to levetiracetam  and levetiracetam  was decreased from 2000 mg twice daily to 1000 mg twice daily.  Since the decrease of the levetiracetam , she has been having more headaches, and that she has been having more seizures.  She reports nocturnal seizures because she will wake up in the morning nauseated, feeling off balance and having bad headaches.  These were similar symptoms that she used to have following a seizure.  She also reported on 1 occasion she was unresponsive when husband was talking to her.  She is experiencing more headaches.  She would like to go back to her previous dose of Keppra .  She has been following up with her PCP, had subsequent sodium level checked and still showing hyponatremia.  She tells me that she drinks 2 Gatorade a day but reports poor appetite, not eating well. When it comes to the VNS, reports pain with magnet stimulation   INTERVAL HISTORY 06/06/2023:  Patient presents today for follow up. Last visit was in March, at that time, we continue the VNS titration, tolerated the procedure well. She tells me that she has  2 light seizures, last one a last week. Her main complaint at this time is throat pain due to seasonal allergies. She confirms with me that it is not related to her VNS.    INTERVAL HISTORY 04/19/2023:  Patient presents today for follow-up, last visit was in February 4.  Since then she has been doing well, she tells me that she had 2 light seizures, on February 15 and the last 1 on March 3.  With the seizures she feels lightheaded, dizzy and sometimes there is confusion.  She is tolerating the VNS settings well, also on swipe at least once a day.  No other complaints, no other concerns.   INTERVAL HISTORY 03/08/2023:  Patient presents today for follow up. She is accompanied by her husband. Last visit was in OctobeR, at that time we have increase her Phenobarbital  to 200 mg nightly and Keppra  to 2000 mg twice daily. She continued to have breakthrough seizures. She had her VNS implanted on January 16, reports one seizure since implantation of VNS, possibly due to missed medication.   INTERVAL HISTORY 11/18/2022 Patient presents today for follow-up, last visit was in January, since then, she tells me that she had her hip surgery but continues to have breakthrough seizures.  She reports at least 2 seizures during her sleep.  Husband is not aware of the seizures but patient tells me that when she wakes up in the morning, she feels tired, feels drowsy, weak and has a bad taste in her mouth and that is how she  knows she had a seizure.   Husband tells me about once a month she will have episode of behavioral arrest, unresponsiveness, looking like she is in a trance.  She is on phenobarbital  200 mg nightly Monday to Friday and 100 mg nightly Saturday and Sunday and Keppra  1500 mg twice daily but still having breakthrough seizure. Her routine EEG showed left temporal epileptiform discharges.    HISTORY OF PRESENT ILLNESS:  This is a 63 year old woman past medical history of seizure disorder, anxiety, depression,  osteoporosis, recent fall with hip fracture who is presenting to establish care.  Patient reports a long history of seizure diagnosed at the age of 31.  Initially she describes her seizures as generalized convulsion with tongue biting.  He was on phenobarbital  and Dilantin .  She reported her seizure was uncontrolled until she initially follow-up with Dr. Milton who added levetiracetam .  Since being on levetiracetam  and phenobarbital  she has not had any generalized convulsion and this is more than 20 years ago.  Now she is reporting seizure that she described as disorientation and losing track of time and confusion.  She still having those and they have been worse in the past year since her son died\.  Currently she is on phenobarbital  97.2 mg 2 tabs nightly and Keppra  1500 mg twice daily.  While in rehab, she was told that her phenobarbital  level was elevated therefore it was decreased to 1 tablet on Saturday and Sunday.  She denies any symptoms  associated with phenobarb toxicity and she reports that her last seizure that she described as disorientation and losing track of time was in December 2023 since then she has not had any additional seizures.  She is tolerating the medications very well and denies any side effect. She denies any seizure risk factors, denies any family history of seizures denies any injury from seizures. Her fall and hip fracture was not related to a seizure.   Handedness: Right handed   Onset: At the age of 80  Seizure Type: Disorientation, losing track of time, last convulsion more than 20 year ago   Current frequency: Last seiure was in Dec 2023  Any injuries from seizures: Tongue biting   Seizure risk factors: Denies   Previous ASMs: Phenobarbital , Levetiracetam , Dilantin ,   Currenty ASMs: Phenobarbital  200 mg nightly, and Levetiracetam  2000 mg twice daily  ASMs side effects: None   Brain Images: None available for review   Previous EEGs: EEG 2013: Normal     OTHER MEDICAL CONDITIONS: Seizure disorder, recent fall and hip fracture, Hypertension, osteoporosis, anxiety/depression   REVIEW OF SYSTEMS: Full 14 system review of systems performed and negative with exception of: As noted in the HPI   ALLERGIES: Allergies  Allergen Reactions   Aspirin Other (See Comments)    Allergic to non coated ASA Caused gastric ulcers    Nsaids Other (See Comments)    Directed by MD to not take.    Singulair  [Montelukast  Sodium] Nausea Only and Other (See Comments)    Became sweaty    HOME MEDICATIONS: Outpatient Medications Prior to Visit  Medication Sig Dispense Refill   acetaminophen  (TYLENOL ) 500 MG tablet Take 1,000 mg by mouth every 6 (six) hours as needed for moderate pain.     alendronate  (FOSAMAX ) 35 MG tablet TAKE 1 TABLET BY MOUTH ONCE WEEKLY IN THE MORNING with full GLASS of water  ON an EMPTY stomach DO not lay down FOR 30 mins 5 tablet 5   amLODipine  (NORVASC ) 2.5 MG tablet TAKE  1 TABLET BY MOUTH DAILY 90 tablet 1   Calcium Carbonate (CALCIUM 500 PO) Take 1 tablet by mouth daily.     Camphor-Eucalyptus-Menthol  (VICKS VAPORUB EX) Apply 1 Application topically as needed (congestion.).     clonazePAM  (KLONOPIN ) 1 MG tablet Take 1 tablet (1 mg total) by mouth 2 (two) times daily. 60 tablet 5   feeding supplement (ENSURE PLUS HIGH PROTEIN) LIQD Take 237 mLs by mouth 2 (two) times daily between meals. 14220 mL 0   fluticasone  (FLONASE ) 50 MCG/ACT nasal spray Place 2 sprays into both nostrils daily. 16 g 6   Humidifier MISC Large humidifier. 1 each 0   metoprolol  tartrate (LOPRESSOR ) 25 MG tablet TAKE 1 TABLET BY MOUTH TWICE DAILY 180 tablet 1   mirtazapine  (REMERON ) 45 MG tablet TAKE 1 TABLET BY MOUTH AT BEDTIME 30 tablet 5   Multiple Vitamin (MULTIVITAMIN) tablet Take 1 tablet by mouth in the morning.     ondansetron  (ZOFRAN ) 4 MG tablet TAKE ONE TABLET BY MOUTH TWICE DAILY AS NEEDED FOR VOMITING 10 tablet 0   PHENObarbital  (LUMINAL) 100 MG  tablet Take 2 tablets (200 mg total) by mouth at bedtime. 180 tablet 3   potassium chloride  (KLOR-CON  M) 10 MEQ tablet Take 10 mEq by mouth 2 (two) times daily.     levETIRAcetam  (KEPPRA ) 500 MG tablet Take 2 tablets (1,000 mg total) by mouth 2 (two) times daily. 120 tablet 0   No facility-administered medications prior to visit.    PAST MEDICAL HISTORY: Past Medical History:  Diagnosis Date   Abnormal facial hair 05/11/2011   Anxiety and depression    GAD (generalized anxiety disorder)    Headache    Heart murmur    ECHO 06/15/22: SAM of anterior MV leaflet with dynamic LVOT gradient in setting of moderate symmetrical LVH and hyperdynamic LVF, Peak resting gradient mild at 24 mmHg   Hypertension    Insomnia    Psychotic disorder (HCC)    Seizure disorder (HCC)    stress related seizures. 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[facility])     PAST SURGICAL HISTORY: Past Surgical History:  Procedure Laterality Date   ABDOMINAL HYSTERECTOMY     BREAST BIOPSY Left 08/24/2022   US  LT BREAST BX W LOC DEV 1ST LESION IMG BX SPEC US  GUIDE 08/24/2022 [facility]-ULTRASOUND   CESAREAN SECTION     CHOLECYSTECTOMY     COLONOSCOPY WITH PROPOFOL  N/A 02/15/2019   Procedure: COLONOSCOPY WITH PROPOFOL ;  Surgeon: Shaaron Lamar HERO, MD;  Location: [facility];  Service: Endoscopy;  Laterality: N/A;  2:45pm   POLYPECTOMY  02/15/2019   Procedure: POLYPECTOMY;  Surgeon: Shaaron Lamar HERO, MD;  Location: [facility];  Service: Endoscopy;;   TOTAL HIP ARTHROPLASTY Left 01/27/2022   Procedure: TOTAL HIP ARTHROPLASTY ANTERIOR APPROACH;  Surgeon: Jerri Kay HERO, MD;  Location: [facility] OR;  Service: Orthopedics;  Laterality: Left;   VAGUS NERVE STIMULATOR INSERTION Left 02/17/2023   Procedure: VAGAL NERVE STIMULATOR IMPLANT;  Surgeon: Lanis Pupa, MD;  Location: [facility] OR;  Service: Neurosurgery;  Laterality: Left;  3C    FAMILY HISTORY: Family History  Problem Relation Age of Onset   Breast cancer Mother  59   Coronary artery disease Father    Stroke Father 32   Breast cancer Sister 87   Breast cancer Sister    Breast cancer Sister    Diabetes Brother    Heart attack Brother    Colon cancer Neg Hx  SOCIAL HISTORY: Social History   Socioeconomic History   Marital status: Married    Spouse name: Jerel   Number of children: 2   Years of education: 12th   Highest education level: 12th grade  Occupational History   Occupation: disabled  Tobacco Use   Smoking status: Never   Smokeless tobacco: Never  Vaping Use   Vaping status: Never Used  Substance and Sexual Activity   Alcohol use: No   Drug use: No   Sexual activity: Yes    Birth control/protection: Surgical  Other Topics Concern   Not on file  Social History Narrative   Right handed   Caffeine-1 cup daily   Lives with husband      Oldest son passed away in Sep 13, 2021   Social Drivers of Health   Financial Resource Strain: Low Risk  (07/08/2022)   Overall Financial Resource Strain (CARDIA)    Difficulty of Paying Living Expenses: Not hard at all  Food Insecurity: No Food Insecurity (08/11/2023)   Hunger Vital Sign    Worried About Running Out of Food in the Last Year: Never true    Ran Out of Food in the Last Year: Never true  Transportation Needs: No Transportation Needs (08/11/2023)   PRAPARE - Administrator, Civil Service (Medical): No    Lack of Transportation (Non-Medical): No  Physical Activity: Inactive (07/08/2022)   Exercise Vital Sign    Days of Exercise per Week: 0 days    Minutes of Exercise per Session: 0 min  Stress: No Stress Concern Present (07/08/2022)   [employer] of Occupational Health - Occupational Stress Questionnaire    Feeling of Stress : Not at all  Social Connections: Socially Integrated (08/11/2023)   Social Connection and Isolation Panel    Frequency of Communication with Friends and Family: Three times a week    Frequency of Social Gatherings with Friends and Family: Once  a week    Attends Religious Services: More than 4 times per year    Active Member of [employer] or Organizations: Yes    Attends Banker Meetings: More than 4 times per year    Marital Status: Married  Catering manager Violence: Not At Risk (08/11/2023)   Humiliation, Afraid, Rape, and Kick questionnaire    Fear of Current or Ex-Partner: No    Emotionally Abused: No    Physically Abused: No    Sexually Abused: No    PHYSICAL EXAM  GENERAL EXAM/CONSTITUTIONAL: Vitals:  Vitals:   08/30/23 1515 08/30/23 1520  BP: (!) 147/87 137/85  Pulse: 83   Resp: 14   SpO2: 98%   Weight: 106 lb 8 oz (48.3 kg)   Height: 5' 2 (1.575 m)    Body mass index is 19.48 kg/m. Wt Readings from Last 3 Encounters:  08/30/23 106 lb 8 oz (48.3 kg)  08/17/23 107 lb 0.6 oz (48.6 kg)  08/12/23 103 lb 2.8 oz (46.8 kg)   Patient is in no distress; well developed, nourished and groomed; neck is supple  MUSCULOSKELETAL: Gait, strength, tone, movements noted in Neurologic exam below  NEUROLOGIC: MENTAL STATUS:     07/08/2022   11:02 AM  MMSE - Mini Mental State Exam  Not completed: Unable to complete   awake, alert, oriented to person, place and time recent and remote memory intact normal attention and concentration language fluent, comprehension intact, naming intact fund of knowledge appropriate  CRANIAL NERVE:  2nd, 3rd, 4th, 6th - Visual fields full to  confrontation, extraocular muscles intact, no nystagmus 5th - facial sensation symmetric 7th - facial strength symmetric 8th - hearing intact 9th - palate elevates symmetrically, uvula midline 11th - shoulder shrug symmetric 12th - tongue protrusion midline  MOTOR:  normal bulk and tone, at least antigravity in the BUE and BLEs  SENSORY:  normal and symmetric to light touch  COORDINATION:  finger-nose-finger, fine finger movements normal  GAIT/STATION:  Using a rollator     DIAGNOSTIC DATA (LABS, IMAGING, TESTING) - I  reviewed patient records, labs, notes, testing and imaging myself where available.  Lab Results  Component Value Date   WBC 6.0 08/12/2023   HGB 13.0 08/12/2023   HCT 39.1 08/12/2023   MCV 100.0 08/12/2023   PLT 369 08/12/2023      Component Value Date/Time   NA 131 (L) 08/17/2023 1141   K 4.5 08/17/2023 1141   CL 97 08/17/2023 1141   CO2 15 (L) 08/17/2023 1141   GLUCOSE 107 (H) 08/17/2023 1141   GLUCOSE 105 (H) 08/13/2023 0326   BUN 12 08/17/2023 1141   CREATININE 0.50 (L) 08/17/2023 1141   CREATININE 0.49 (L) 11/29/2019 0926   CALCIUM 7.9 (L) 08/17/2023 1141   PROT 6.6 08/17/2023 1141   ALBUMIN 3.8 (L) 08/17/2023 1141   AST 57 (H) 08/17/2023 1141   ALT 23 08/17/2023 1141   ALKPHOS 198 (H) 08/17/2023 1141   BILITOT 0.2 08/17/2023 1141   GFRNONAA >60 08/13/2023 0326   GFRNONAA 105 11/29/2019 0926   GFRAA 120 02/27/2020 1508   GFRAA 122 11/29/2019 0926   Lab Results  Component Value Date   CHOL 159 07/14/2023   HDL 59 07/14/2023   LDLCALC 84 07/14/2023   TRIG 88 07/14/2023   Lab Results  Component Value Date   HGBA1C 5.6 02/27/2020   Lab Results  Component Value Date   VITAMINB12 469 08/12/2023   Lab Results  Component Value Date   TSH 1.730 05/11/2022    EEG 2013: Normal   EEG 2024: Left temporal sharps    ASSESSMENT AND PLAN  63 y.o. year old female  with history of longstanding seizure disorder, anxiety/depression, recent fall with left hip fracture s/p surgery who is presenting for follow up.  Her recent EEG showed left temporal discharges. She continues to have breakthrough seizures despite phenobarbital  200 mg nightly and Levetiracetam  2000 mg twice daily. She is now status post VNS implantation on January 16 , 2025.  She was recently admitted to the hospital for hyponatremia.  Her Levetiracetam  was decreased and now she is having additional seizures.  My plan will be to increase the Levetiracetam  back to 2000 mg twice daily, advised her to increase  her salt intake, continue with Gatorade and if sodium level still low will consider salt tab. This was explained to the patient and she is comfortable with plan.  Due to pain with each magnet stimulation, I will decrease the VNS setting.  I will see her in 2 months for follow-up at that time, we will likely increase the VNS setting back up.  Advised her to contact me if she does have any additional breakthrough seizures or worsening her hyponatremia.      1. Therapeutic drug monitoring   2. Localization-related symptomatic epilepsy and epileptic syndromes with complex partial seizures, intractable, without status epilepticus (HCC)   3. S/P placement of VNS (vagus nerve stimulation) device   4. Hyponatremia      Patient Instructions  Increase levetiracetam  back to 2000 mg twice  daily Continue with phenobarbital  200 mg twice daily Will check a sodium level today, and in 2 weeks, and if still evidence of hyponatremia, will consider salt tab VNS settings changed today patient tolerated procedure very well Follow-up in 2 months or sooner if worse Please contact me for any additional questions or concerns.    Per [place]  DMV statutes, patients with seizures are not allowed to drive until they have been seizure-free for six months.  Other recommendations include using caution when using heavy equipment or power tools. Avoid working on ladders or at heights. Take showers instead of baths.  Do not swim alone.  Ensure the water  temperature is not too high on the home water  heater. Do not go swimming alone. Do not lock yourself in a room alone (i.e. bathroom). When caring for infants or small children, sit down when holding, feeding, or changing them to minimize risk of injury to the child in the event you have a seizure. Maintain good sleep hygiene. Avoid alcohol.  Also recommend adequate sleep, hydration, good diet and minimize stress.   During the Seizure  - First, ensure adequate  ventilation and place patients on the floor on their left side  Loosen clothing around the neck and ensure the airway is patent. If the patient is clenching the teeth, do not force the mouth open with any object as this can cause severe damage - Remove all items from the surrounding that can be hazardous. The patient may be oblivious to what's happening and may not even know what he or she is doing. If the patient is confused and wandering, either gently guide him/her away and block access to outside areas - Reassure the individual and be comforting - Call 911. In most cases, the seizure ends before EMS arrives. However, there are cases when seizures may last over 3 to 5 minutes. Or the individual may have developed breathing difficulties or severe injuries. If a pregnant patient or a person with diabetes develops a seizure, it is prudent to call an ambulance. - Finally, if the patient does not regain full consciousness, then call EMS. Most patients will remain confused for about 45 to 90 minutes after a seizure, so you must use judgment in calling for help. - Avoid restraints but make sure the patient is in a bed with padded side rails - Place the individual in a lateral position with the neck slightly flexed; this will help the saliva drain from the mouth and prevent the tongue from falling backward - Remove all nearby furniture and other hazards from the area - Provide verbal assurance as the individual is regaining consciousness - Provide the patient with privacy if possible - Call for help and start treatment as ordered by the caregiver   After the Seizure (Postictal Stage)  After a seizure, most patients experience confusion, fatigue, muscle pain and/or a headache. Thus, one should permit the individual to sleep. For the next few days, reassurance is essential. Being calm and helping reorient the person is also of importance.  Most seizures are painless and end spontaneously. Seizures are not  harmful to others but can lead to complications such as stress on the lungs, brain and the heart. Individuals with prior lung problems may develop labored breathing and respiratory distress.     Orders Placed This Encounter  Procedures   Basic Metabolic Panel   Basic Metabolic Panel   Levetiracetam  level   Vagal Nerve Stimulation    Meds ordered this encounter  Medications  levETIRAcetam  (KEPPRA ) 500 MG tablet    Sig: Take 4 tablets (2,000 mg total) by mouth 2 (two) times daily.    Dispense:  720 tablet    Refill:  3    Return in about 10 weeks (around 11/08/2023).  The patient's condition of epilepsy requires frequent monitoring and adjustments in the treatment plan, reflecting the ongoing complexity of care.  This provider is the continuing focal point for all needed services for this condition.   Pastor Falling, MD 08/30/2023, 8:25 PM  [facility] [address] [place], [place] [address] [phone]

## 2023-08-30 NOTE — Patient Instructions (Signed)
 Increase levetiracetam  back to 2000 mg twice daily Continue with phenobarbital  200 mg twice daily Will check a sodium level today, and in 2 weeks, and if still evidence of hyponatremia, will consider salt tab VNS settings changed today patient tolerated procedure very well Follow-up in 2 months or sooner if worse Please contact me for any additional questions or concerns.

## 2023-08-31 ENCOUNTER — Ambulatory Visit: Payer: Self-pay | Admitting: Neurology

## 2023-08-31 LAB — BASIC METABOLIC PANEL WITH GFR
BUN/Creatinine Ratio: 6 — ABNORMAL LOW (ref 12–28)
BUN: 3 mg/dL — ABNORMAL LOW (ref 8–27)
CO2: 20 mmol/L (ref 20–29)
Calcium: 9.9 mg/dL (ref 8.7–10.3)
Chloride: 89 mmol/L — ABNORMAL LOW (ref 96–106)
Creatinine, Ser: 0.53 mg/dL — ABNORMAL LOW (ref 0.57–1.00)
Glucose: 99 mg/dL (ref 70–99)
Potassium: 4.7 mmol/L (ref 3.5–5.2)
Sodium: 125 mmol/L — ABNORMAL LOW (ref 134–144)
eGFR: 103 mL/min/1.73 (ref >59)

## 2023-08-31 MED ORDER — SODIUM CHLORIDE 1 G PO TABS
1.0000 g | ORAL_TABLET | Freq: Two times a day (BID) | ORAL | 3 refills | Status: DC
Start: 2023-08-31 — End: 2023-11-09

## 2023-09-01 ENCOUNTER — Ambulatory Visit (INDEPENDENT_AMBULATORY_CARE_PROVIDER_SITE_OTHER): Admitting: Family Medicine

## 2023-09-01 ENCOUNTER — Encounter: Payer: Self-pay | Admitting: Family Medicine

## 2023-09-01 VITALS — BP 122/71 | HR 82 | Resp 16 | Ht 62.0 in | Wt 103.0 lb

## 2023-09-01 DIAGNOSIS — H9202 Otalgia, left ear: Secondary | ICD-10-CM | POA: Diagnosis not present

## 2023-09-01 DIAGNOSIS — R569 Unspecified convulsions: Secondary | ICD-10-CM

## 2023-09-01 DIAGNOSIS — I10 Essential (primary) hypertension: Secondary | ICD-10-CM

## 2023-09-01 DIAGNOSIS — E871 Hypo-osmolality and hyponatremia: Secondary | ICD-10-CM | POA: Diagnosis not present

## 2023-09-01 NOTE — Patient Instructions (Addendum)
 F/U in 4 months  Good you are doing better  Take salt tablets prescribed by Neurologist , pease start today  You will need repeat blood work to check your potassium I 2 weeks, your Neurologist will order the lab andmange your low sodium, reach out to him for directions  Keep appointment with Neurology  as set in October  ENT in [place] should be reaching out with an apointment for you regarding chronic left ear pain  Enjoy  ensure and keep eatingsmall amounts often  Thanks for choosing [facility], we consider it a privelige to serve you.

## 2023-09-04 ENCOUNTER — Encounter: Payer: Self-pay | Admitting: Family Medicine

## 2023-09-04 NOTE — Progress Notes (Signed)
   Trenity Pha     MRN: [id number]      DOB: 10-Feb-1960  Chief Complaint  Patient presents with   Hypertension    Follow up     HPI Ms. Dimaano is here for follow up and re-evaluation of chronic medical conditions, medication management and review of any available recent lab and radiology data.  Preventive health is updated, specifically  Cancer screening and Immunization.   Questions or concerns regarding consultations or procedures which the PT has had in the interim are  addressed.Saw neurology 2 days ago and Doc is addressing hyponatrmia Chronic left ear pain still waiting on appt with ENT ROS Denies recent fever or chills. Denies sinus pressure, nasal congestion, ear pain or sore throat. Denies chest congestion, productive cough or wheezing. Denies chest pains, palpitations and leg swelling Denies abdominal pain, nausea, vomiting,diarrhea or constipation.   Denies dysuria, frequency, hesitancy or incontinence. Denies joint pain, swelling and limitation in mobility. Denies skin break down or rash.   PE  BP 122/71   Pulse 82   Resp 16   Ht 5' 2 (1.575 m)   Wt 103 lb 0.6 oz (46.7 kg)   SpO2 98%   BMI 18.85 kg/m   Patient alert and oriented and in no cardiopulmonary distress.  HEENT: No facial asymmetry, EOMI,     Neck supple .  Chest: Clear to auscultation bilaterally.  CVS: S1, S2 no murmurs, no S3.Regular rate.  ABD: Soft non tender.   Ext: No edema  MS: Adequate ROM spine, shoulders, hips and knees.  Skin: Intact, no ulcerations or rash noted.  Psych: Good eye contact, normal affect. Memory intact not anxious or depressed appearing.  CNS: CN 2-12 intact, power,  normal throughout.no focal deficits noted.   Assessment & Plan  Otalgia, left ear Persistent , awaiting ENT eval  Seizure (HCC) Recurrent seizures , reported though less frequent, followed by Neurology  Hyponatremia Recurrent , Neurology now managing  Essential  hypertension Controlled, no change in medication

## 2023-09-04 NOTE — Assessment & Plan Note (Signed)
 Recurrent seizures , reported though less frequent, followed by Neurology

## 2023-09-04 NOTE — Assessment & Plan Note (Signed)
 Controlled, no change in medication

## 2023-09-04 NOTE — Assessment & Plan Note (Signed)
 Persistent , awaiting ENT eval

## 2023-09-04 NOTE — Assessment & Plan Note (Signed)
 Recurrent , Neurology now managing

## 2023-09-05 DIAGNOSIS — Z79899 Other long term (current) drug therapy: Secondary | ICD-10-CM | POA: Diagnosis not present

## 2023-09-05 DIAGNOSIS — R296 Repeated falls: Secondary | ICD-10-CM | POA: Diagnosis not present

## 2023-09-05 DIAGNOSIS — M810 Age-related osteoporosis without current pathological fracture: Secondary | ICD-10-CM | POA: Diagnosis not present

## 2023-09-05 DIAGNOSIS — H9202 Otalgia, left ear: Secondary | ICD-10-CM | POA: Diagnosis not present

## 2023-09-05 DIAGNOSIS — G43909 Migraine, unspecified, not intractable, without status migrainosus: Secondary | ICD-10-CM | POA: Diagnosis not present

## 2023-09-05 DIAGNOSIS — G4700 Insomnia, unspecified: Secondary | ICD-10-CM | POA: Diagnosis not present

## 2023-09-05 DIAGNOSIS — E8729 Other acidosis: Secondary | ICD-10-CM | POA: Diagnosis not present

## 2023-09-05 DIAGNOSIS — E871 Hypo-osmolality and hyponatremia: Secondary | ICD-10-CM | POA: Diagnosis not present

## 2023-09-05 DIAGNOSIS — I10 Essential (primary) hypertension: Secondary | ICD-10-CM | POA: Diagnosis not present

## 2023-09-05 DIAGNOSIS — J320 Chronic maxillary sinusitis: Secondary | ICD-10-CM | POA: Diagnosis not present

## 2023-09-05 DIAGNOSIS — Z96642 Presence of left artificial hip joint: Secondary | ICD-10-CM | POA: Diagnosis not present

## 2023-09-05 DIAGNOSIS — Z7983 Long term (current) use of bisphosphonates: Secondary | ICD-10-CM | POA: Diagnosis not present

## 2023-09-06 ENCOUNTER — Telehealth: Payer: Self-pay

## 2023-09-06 DIAGNOSIS — G43909 Migraine, unspecified, not intractable, without status migrainosus: Secondary | ICD-10-CM | POA: Diagnosis not present

## 2023-09-06 DIAGNOSIS — I10 Essential (primary) hypertension: Secondary | ICD-10-CM | POA: Diagnosis not present

## 2023-09-06 DIAGNOSIS — J320 Chronic maxillary sinusitis: Secondary | ICD-10-CM | POA: Diagnosis not present

## 2023-09-06 DIAGNOSIS — Z96642 Presence of left artificial hip joint: Secondary | ICD-10-CM | POA: Diagnosis not present

## 2023-09-06 DIAGNOSIS — R296 Repeated falls: Secondary | ICD-10-CM | POA: Diagnosis not present

## 2023-09-06 DIAGNOSIS — Z7983 Long term (current) use of bisphosphonates: Secondary | ICD-10-CM | POA: Diagnosis not present

## 2023-09-06 DIAGNOSIS — G4700 Insomnia, unspecified: Secondary | ICD-10-CM | POA: Diagnosis not present

## 2023-09-06 DIAGNOSIS — Z79899 Other long term (current) drug therapy: Secondary | ICD-10-CM | POA: Diagnosis not present

## 2023-09-06 DIAGNOSIS — M810 Age-related osteoporosis without current pathological fracture: Secondary | ICD-10-CM | POA: Diagnosis not present

## 2023-09-06 DIAGNOSIS — E8729 Other acidosis: Secondary | ICD-10-CM | POA: Diagnosis not present

## 2023-09-06 DIAGNOSIS — E871 Hypo-osmolality and hyponatremia: Secondary | ICD-10-CM | POA: Diagnosis not present

## 2023-09-06 DIAGNOSIS — H9202 Otalgia, left ear: Secondary | ICD-10-CM | POA: Diagnosis not present

## 2023-09-06 NOTE — Telephone Encounter (Signed)
 Left detailed vm for [facility]

## 2023-09-06 NOTE — Telephone Encounter (Signed)
 Copied from CRM [id number]. Topic: Clinical - Home Health Verbal Orders >> Sep 06, 2023 11:16 AM Emylou G wrote: Caller/Agency: [facility] Callback Number: [phone] secured line Service Requested: Occupational Therapy Frequency: 1w3 Any new concerns about the patient? Yes .. 142/92 when she was accessed.SABRA

## 2023-09-08 ENCOUNTER — Telehealth: Payer: Self-pay | Admitting: Family Medicine

## 2023-09-08 NOTE — Telephone Encounter (Signed)
Obtained.

## 2023-09-08 NOTE — Telephone Encounter (Signed)
 PCS Noted Copied Scanned Original in provider box Copy at front desk

## 2023-09-08 NOTE — Telephone Encounter (Signed)
In provider box!

## 2023-09-13 ENCOUNTER — Ambulatory Visit: Admitting: Neurology

## 2023-09-13 DIAGNOSIS — H9202 Otalgia, left ear: Secondary | ICD-10-CM | POA: Diagnosis not present

## 2023-09-13 DIAGNOSIS — J320 Chronic maxillary sinusitis: Secondary | ICD-10-CM | POA: Diagnosis not present

## 2023-09-13 DIAGNOSIS — G4700 Insomnia, unspecified: Secondary | ICD-10-CM | POA: Diagnosis not present

## 2023-09-13 DIAGNOSIS — E8729 Other acidosis: Secondary | ICD-10-CM | POA: Diagnosis not present

## 2023-09-13 DIAGNOSIS — G43909 Migraine, unspecified, not intractable, without status migrainosus: Secondary | ICD-10-CM | POA: Diagnosis not present

## 2023-09-13 DIAGNOSIS — M810 Age-related osteoporosis without current pathological fracture: Secondary | ICD-10-CM | POA: Diagnosis not present

## 2023-09-13 DIAGNOSIS — R296 Repeated falls: Secondary | ICD-10-CM | POA: Diagnosis not present

## 2023-09-13 DIAGNOSIS — Z7983 Long term (current) use of bisphosphonates: Secondary | ICD-10-CM | POA: Diagnosis not present

## 2023-09-13 DIAGNOSIS — E871 Hypo-osmolality and hyponatremia: Secondary | ICD-10-CM | POA: Diagnosis not present

## 2023-09-13 DIAGNOSIS — I10 Essential (primary) hypertension: Secondary | ICD-10-CM | POA: Diagnosis not present

## 2023-09-13 DIAGNOSIS — Z79899 Other long term (current) drug therapy: Secondary | ICD-10-CM | POA: Diagnosis not present

## 2023-09-13 DIAGNOSIS — Z96642 Presence of left artificial hip joint: Secondary | ICD-10-CM | POA: Diagnosis not present

## 2023-09-14 DIAGNOSIS — I10 Essential (primary) hypertension: Secondary | ICD-10-CM | POA: Diagnosis not present

## 2023-09-14 DIAGNOSIS — E8729 Other acidosis: Secondary | ICD-10-CM | POA: Diagnosis not present

## 2023-09-14 DIAGNOSIS — R296 Repeated falls: Secondary | ICD-10-CM | POA: Diagnosis not present

## 2023-09-14 DIAGNOSIS — G4700 Insomnia, unspecified: Secondary | ICD-10-CM | POA: Diagnosis not present

## 2023-09-14 DIAGNOSIS — E871 Hypo-osmolality and hyponatremia: Secondary | ICD-10-CM | POA: Diagnosis not present

## 2023-09-14 DIAGNOSIS — Z96642 Presence of left artificial hip joint: Secondary | ICD-10-CM | POA: Diagnosis not present

## 2023-09-14 DIAGNOSIS — J320 Chronic maxillary sinusitis: Secondary | ICD-10-CM | POA: Diagnosis not present

## 2023-09-14 DIAGNOSIS — H9202 Otalgia, left ear: Secondary | ICD-10-CM | POA: Diagnosis not present

## 2023-09-14 DIAGNOSIS — G43909 Migraine, unspecified, not intractable, without status migrainosus: Secondary | ICD-10-CM | POA: Diagnosis not present

## 2023-09-14 DIAGNOSIS — Z79899 Other long term (current) drug therapy: Secondary | ICD-10-CM | POA: Diagnosis not present

## 2023-09-14 DIAGNOSIS — M810 Age-related osteoporosis without current pathological fracture: Secondary | ICD-10-CM | POA: Diagnosis not present

## 2023-09-14 DIAGNOSIS — Z7983 Long term (current) use of bisphosphonates: Secondary | ICD-10-CM | POA: Diagnosis not present

## 2023-09-19 ENCOUNTER — Ambulatory Visit (INDEPENDENT_AMBULATORY_CARE_PROVIDER_SITE_OTHER): Admitting: Physician Assistant

## 2023-09-19 ENCOUNTER — Encounter (INDEPENDENT_AMBULATORY_CARE_PROVIDER_SITE_OTHER): Payer: Self-pay | Admitting: Physician Assistant

## 2023-09-19 VITALS — BP 143/84 | HR 73

## 2023-09-19 DIAGNOSIS — M542 Cervicalgia: Secondary | ICD-10-CM | POA: Diagnosis not present

## 2023-09-19 NOTE — Progress Notes (Unsigned)
 Dear Dr. Antonetta, Here is my assessment for our mutual patient, Sandra Riley. Thank you for allowing me the opportunity to care for your patient. Please do not hesitate to contact me should you have any other questions. Sincerely, Chyrl Cohen PA-C  Otolaryngology Clinic Note Referring provider: Dr. Antonetta HPI:  Sandra Riley is a 63 y.o. female kindly referred by Dr. Antonetta   The patient is a 63 year old female seen in our office for evaluation of left-sided neck pain.  The patient notes that over the last approximate 6 months she has had pain in the left side of her neck radiating up to the left side of her head.  She notes this is worse with movements of the neck, worse with palpation.  She also notes some painful swallowing, the pain does radiate into the left ear as well.  She denies any fever, no swelling.  In January 2025 she had VNS placed for epilepsy.  She feels soreness over the wire.  She reports she is a non-smoker, she has occasional hoarseness of her voice, she has decreased appetite and had some recent weight loss, no fevers, she denies any hearing deficits, no ringing in the ears.  She has been seen by her primary care, she was treated for sinus infection over the last several months and is also used Flonase  and daily antihistamine with no significant improvement in her symptoms.  Independent Review of Additional Tests or Records:  Primary care office visit note on 08/11/2023   PMH/Meds/All/SocHx/FamHx/ROS:   Past Medical History:  Diagnosis Date   Abnormal facial hair 05/11/2011   Anxiety and depression    GAD (generalized anxiety disorder)    Headache    Heart murmur    ECHO 06/15/22: SAM of anterior MV leaflet with dynamic LVOT gradient in setting of moderate symmetrical LVH and hyperdynamic LVF, Peak resting gradient mild at 24 mmHg   Hypertension    Insomnia    Psychotic disorder (HCC)    Seizure disorder (HCC)    stress related seizures. 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[facility])      Past Surgical History:  Procedure Laterality Date   ABDOMINAL HYSTERECTOMY     BREAST BIOPSY Left 08/24/2022   US  LT BREAST BX W LOC DEV 1ST LESION IMG BX SPEC US  GUIDE 08/24/2022 [facility]-ULTRASOUND   CESAREAN SECTION     CHOLECYSTECTOMY     COLONOSCOPY WITH PROPOFOL  N/A 02/15/2019   Procedure: COLONOSCOPY WITH PROPOFOL ;  Surgeon: Shaaron Lamar HERO, MD;  [facility];  Service: Endoscopy;  Laterality: N/A;  2:45pm   POLYPECTOMY  02/15/2019   Procedure: POLYPECTOMY;  Surgeon: Shaaron Lamar HERO, MD;  Location: [facility];  Service: Endoscopy;;   TOTAL HIP ARTHROPLASTY Left 01/27/2022   Procedure: TOTAL HIP ARTHROPLASTY ANTERIOR APPROACH;  Surgeon: Jerri Kay HERO, MD;  Location: [facility] OR;  Service: Orthopedics;  Laterality: Left;   VAGUS NERVE STIMULATOR INSERTION Left 02/17/2023   Procedure: VAGAL NERVE STIMULATOR IMPLANT;  Surgeon: Lanis Pupa, MD;  Location: [facility] OR;  Service: Neurosurgery;  Laterality: Left;  3C    Family History  Problem Relation Age of Onset   Breast cancer Mother 73   Coronary artery disease Father    Stroke Father 100   Breast cancer Sister 22   Breast cancer Sister    Breast cancer Sister    Diabetes Brother    Heart attack Brother    Colon cancer Neg Hx  Social Connections: Socially Integrated (08/11/2023)   Social Connection and Isolation Panel    Frequency of Communication with Friends and Family: Three times a week    Frequency of Social Gatherings with Friends and Family: Once a week    Attends Religious Services: More than 4 times per year    Active Member of [employer] or Organizations: Yes    Attends Engineer, structural: More than 4 times per year    Marital Status: Married      Current Outpatient Medications:    acetaminophen  (TYLENOL ) 500 MG tablet, Take 1,000 mg by mouth every 6 (six) hours as needed for moderate pain., Disp: , Rfl:    alendronate  (FOSAMAX ) 35 MG tablet, TAKE 1 TABLET  BY MOUTH ONCE WEEKLY IN THE MORNING with full GLASS of water  ON an EMPTY stomach DO not lay down FOR 30 mins, Disp: 5 tablet, Rfl: 5   amLODipine  (NORVASC ) 2.5 MG tablet, TAKE 1 TABLET BY MOUTH DAILY, Disp: 90 tablet, Rfl: 1   Calcium Carbonate (CALCIUM 500 PO), Take 1 tablet by mouth daily., Disp: , Rfl:    Camphor-Eucalyptus-Menthol  (VICKS VAPORUB EX), Apply 1 Application topically as needed (congestion.)., Disp: , Rfl:    clonazePAM  (KLONOPIN ) 1 MG tablet, Take 1 tablet (1 mg total) by mouth 2 (two) times daily., Disp: 60 tablet, Rfl: 5   fluticasone  (FLONASE ) 50 MCG/ACT nasal spray, Place 2 sprays into both nostrils daily., Disp: 16 g, Rfl: 6   Humidifier MISC, Large humidifier., Disp: 1 each, Rfl: 0   levETIRAcetam  (KEPPRA ) 500 MG tablet, Take 4 tablets (2,000 mg total) by mouth 2 (two) times daily., Disp: 720 tablet, Rfl: 3   metoprolol  tartrate (LOPRESSOR ) 25 MG tablet, TAKE 1 TABLET BY MOUTH TWICE DAILY, Disp: 180 tablet, Rfl: 1   mirtazapine  (REMERON ) 45 MG tablet, TAKE 1 TABLET BY MOUTH AT BEDTIME, Disp: 30 tablet, Rfl: 5   Multiple Vitamin (MULTIVITAMIN) tablet, Take 1 tablet by mouth in the morning., Disp: , Rfl:    ondansetron  (ZOFRAN ) 4 MG tablet, TAKE ONE TABLET BY MOUTH TWICE DAILY AS NEEDED FOR VOMITING, Disp: 10 tablet, Rfl: 0   PHENObarbital  (LUMINAL) 100 MG tablet, Take 2 tablets (200 mg total) by mouth at bedtime., Disp: 180 tablet, Rfl: 3   potassium chloride  (KLOR-CON  M) 10 MEQ tablet, Take 10 mEq by mouth 2 (two) times daily., Disp: , Rfl:    sodium chloride  1 g tablet, Take 1 tablet (1 g total) by mouth 2 (two) times daily with a meal., Disp: 60 tablet, Rfl: 3   Physical Exam:   BP (!) 143/84   Pulse 73   SpO2 98%   Pertinent Findings  CN II-XII intact Bilateral EAC clear and TM intact with well pneumatized middle ear spaces Weber 512: equal Rinne 512: AC > BC b/l  Anterior rhinoscopy: Septum midline; bilateral inferior turbinates with hypertrophy No lesions of  oral cavity/oropharynx Neck with tenderness to palpation of the left lateral soft tissue, sternocleidomastoid, left VNS wire extending up to the left temporal region no swelling, no palpable lymph nodes No respiratory distress or stridor  Seprately Identifiable Procedures:  Procedure Note Pre-procedure diagnosis:  neck pain, sore throat  Post-procedure diagnosis: Same Procedure: Transnasal Fiberoptic Laryngoscopy, CPT 68424 - Mod 25 Indication: see above Complications: None apparent EBL: 0 mL   The procedure was undertaken to further evaluate the patient's complaint of neck pain and sore throat , with mirror exam inadequate for appropriate examination due to gag reflex and poor  patient tolerance   Procedure:  Patient was identified as correct patient. Verbal consent was obtained. The nose was sprayed with oxymetazoline and 4% lidocaine . The The flexible laryngoscope was passed through the nose to view the nasal cavity, pharynx (oropharynx, hypopharynx) and larynx.  The larynx was examined at rest and during multiple phonatory tasks. Documentation was obtained and reviewed with patient. The scope was removed. The patient tolerated the procedure well.   Findings: The nasal cavity and nasopharynx did not reveal any masses or lesions, mucosa appeared to be without obvious lesions. The tongue base, pharyngeal walls, piriform sinuses, vallecula, epiglottis and postcricoid region are normal in appearance. The visualized portion of the subglottis and proximal trachea is widely patent. The vocal folds are mobile bilaterally.. There are no lesions on the free edge of the vocal folds nor elsewhere in the larynx worrisome for malignancy.          Impression & Plans:  Sandra Riley is a 63 y.o. female with the following   Neck pain-  This is a 63 year old female seen in our office for evaluation of left-sided neck pain.  She additionally notes sore throat and ear pain.  Uncertain etiology at this  time.  She has been treated with antibiotics, no infectious signs or symptoms on my exam today.  She denies any changes to her hearing.  She did have a VNS placed in January 2025 and does have some soreness around that site but this complaint she is presenting with extends beyond the VNS site.  Nasal endoscope showed no significant abnormalities.  I do think given the approximate 37-month history of ongoing pain in the neck with otherwise negative workup CT soft tissue neck with contrast is indicated to rule out any other significant etiology.  Once these results are available would like to see the patient back in the office for repeat evaluation, sooner as needed.  The patient verbalized understanding and agreement to today's plan had no further questions or concerns.   - f/u 2 to 3 weeks after CT soft tissue neck with contrast   Thank you for allowing me the opportunity to care for your patient. Please do not hesitate to contact me should you have any other questions.  Sincerely, Chyrl Cohen PA-C [facility] Phone: [phone] Fax: [phone]  09/19/2023, 1:04 PM

## 2023-09-19 NOTE — Patient Instructions (Signed)
 I have ordered an imaging study for you to complete prior to your next visit. Please call [facility] Scheduling at [phone] to schedule your imaging if you have not received a call within 24 hours. If you are unable to complete your imaging study prior to your next scheduled visit please call our office to let us  know.

## 2023-09-20 ENCOUNTER — Other Ambulatory Visit: Payer: Self-pay | Admitting: Neurology

## 2023-09-20 ENCOUNTER — Ambulatory Visit: Payer: Self-pay

## 2023-09-20 DIAGNOSIS — E8729 Other acidosis: Secondary | ICD-10-CM | POA: Diagnosis not present

## 2023-09-20 DIAGNOSIS — H9202 Otalgia, left ear: Secondary | ICD-10-CM | POA: Diagnosis not present

## 2023-09-20 DIAGNOSIS — Z79899 Other long term (current) drug therapy: Secondary | ICD-10-CM | POA: Diagnosis not present

## 2023-09-20 DIAGNOSIS — J320 Chronic maxillary sinusitis: Secondary | ICD-10-CM | POA: Diagnosis not present

## 2023-09-20 DIAGNOSIS — I10 Essential (primary) hypertension: Secondary | ICD-10-CM | POA: Diagnosis not present

## 2023-09-20 DIAGNOSIS — R296 Repeated falls: Secondary | ICD-10-CM | POA: Diagnosis not present

## 2023-09-20 DIAGNOSIS — G43909 Migraine, unspecified, not intractable, without status migrainosus: Secondary | ICD-10-CM | POA: Diagnosis not present

## 2023-09-20 DIAGNOSIS — E871 Hypo-osmolality and hyponatremia: Secondary | ICD-10-CM | POA: Diagnosis not present

## 2023-09-20 DIAGNOSIS — M810 Age-related osteoporosis without current pathological fracture: Secondary | ICD-10-CM | POA: Diagnosis not present

## 2023-09-20 DIAGNOSIS — Z96642 Presence of left artificial hip joint: Secondary | ICD-10-CM | POA: Diagnosis not present

## 2023-09-20 DIAGNOSIS — Z7983 Long term (current) use of bisphosphonates: Secondary | ICD-10-CM | POA: Diagnosis not present

## 2023-09-20 DIAGNOSIS — G4700 Insomnia, unspecified: Secondary | ICD-10-CM | POA: Diagnosis not present

## 2023-09-20 NOTE — Telephone Encounter (Signed)
 Chelsa is the only available provider- scheduled with her on Thursday. Please advise patient if she needs to be seen at [facility] before then. Thanks

## 2023-09-20 NOTE — Telephone Encounter (Signed)
 Requested Prescriptions   Pending Prescriptions Disp Refills   PHENObarbital  (LUMINAL) 100 MG tablet [Pharmacy Med Name: phenobarbital  100 mg tablet] 180 tablet 3    Sig: TAKE 2 TABLETS BY MOUTH AT BEDTIME   Last seen 08/30/23 Next appt 11/08/23 Dispenses   Dispensed Days Supply Quantity Provider Pharmacy  phenobarbital  100 mg tablet 08/31/2023 30 60 each Onita Duos, MD [facility], ...  PHENOBARBITAL   100 MG TABS 06/06/2023 90 180 tablet Onita Duos, MD [facility], ...  phenobarbital  100 mg tablet 06/06/2023 30 60 each Yan, Yijun, MD [facility] [place], ...  phenobarbital  100 mg tablet 03/11/2023 90 180 each Gregg Lek, MD [facility] [place], ...  phenobarbital  100 mg tablet 12/29/2022 90 180 each Gregg Lek, MD [facility] [place], ...  phenobarbital  100 mg tablet 10/10/2022 98 168 each Gregg Lek, MD [facility] - [place], .SABRASABRA

## 2023-09-20 NOTE — Telephone Encounter (Signed)
 FYI Only or Action Required?: Action required by provider: update on patient condition.  Patient was last seen in primary care on 09/01/2023 by Antonetta Rollene BRAVO, MD.  Called Nurse Triage reporting Hypertension.  Symptoms began yesterday.  Interventions attempted: OTC medications: acetaminophen .  Symptoms are: gradually improving.  Triage Disposition: See PCP Within 2 Weeks  Patient/caregiver understands and will follow disposition?: Yes    Copied from CRM [id number]. Topic: Clinical - Red Word Triage >> Sep 20, 2023 11:08 AM Gustabo D wrote: [facility] is calling is reporting high blood pressure 164/78 and a ongoing headache  Call back [phone] Reason for Disposition  [1] Systolic BP >= 130 OR Diastolic >= 80 AND [2] taking BP medications  Answer Assessment - Initial Assessment Questions 1. BLOOD PRESSURE: What is your blood pressure? Did you take at least two measurements 5 minutes apart?     164/78        new reading 147/76 HR 66 2. ONSET: When did you take your blood pressure?     This morning 3. HOW: How did you take your blood pressure? (e.g., automatic home BP monitor, visiting nurse)     Visiting nurse 4. HISTORY: Do you have a history of high blood pressure?     yes 5. MEDICINES: Are you taking any medicines for blood pressure? Have you missed any doses recently?     no 6. OTHER SYMPTOMS: Do you have any symptoms? (e.g., blurred vision, chest pain, difficulty breathing, headache, weakness)     headache  Protocols used: Blood Pressure - High-A-AH

## 2023-09-20 NOTE — Telephone Encounter (Signed)
 Spoke to pt, refused UC says her head is feeling better. Will come Thursday with medications

## 2023-09-22 ENCOUNTER — Ambulatory Visit: Admitting: Nurse Practitioner

## 2023-09-22 ENCOUNTER — Encounter: Payer: Self-pay | Admitting: Nurse Practitioner

## 2023-09-22 VITALS — BP 142/83 | HR 114 | Ht 62.0 in | Wt 102.0 lb

## 2023-09-22 DIAGNOSIS — F32A Depression, unspecified: Secondary | ICD-10-CM

## 2023-09-22 DIAGNOSIS — J301 Allergic rhinitis due to pollen: Secondary | ICD-10-CM | POA: Diagnosis not present

## 2023-09-22 DIAGNOSIS — F419 Anxiety disorder, unspecified: Secondary | ICD-10-CM

## 2023-09-22 DIAGNOSIS — R0981 Nasal congestion: Secondary | ICD-10-CM | POA: Diagnosis not present

## 2023-09-22 DIAGNOSIS — J0100 Acute maxillary sinusitis, unspecified: Secondary | ICD-10-CM | POA: Diagnosis not present

## 2023-09-22 MED ORDER — AMOXICILLIN-POT CLAVULANATE 875-125 MG PO TABS
1.0000 | ORAL_TABLET | Freq: Two times a day (BID) | ORAL | 0 refills | Status: DC
Start: 2023-09-22 — End: 2023-10-25

## 2023-09-22 MED ORDER — MIRTAZAPINE 45 MG PO TABS: 45.0000 mg | ORAL_TABLET | Freq: Every day | ORAL | 5 refills | Status: DC

## 2023-09-22 MED ORDER — CETIRIZINE HCL 10 MG PO TABS: 10.0000 mg | ORAL_TABLET | Freq: Every day | ORAL | 11 refills | Status: AC

## 2023-09-22 MED ORDER — FLUTICASONE PROPIONATE 50 MCG/ACT NA SUSP: 2.0000 | Freq: Every day | NASAL | 6 refills | Status: AC

## 2023-09-22 NOTE — Patient Instructions (Signed)
 1) Allergic rhinitis - fluticasone  NS and cetirizine  2) Sinusitis, maxillary - amoxicillin  with clavulanate 3) Refilled mirtazapine  4) Keep follow up with Dr. Antonetta in the future

## 2023-09-22 NOTE — Progress Notes (Signed)
 Established Patient Office Visit  Subjective:  Patient ID: Sandra Riley, female    DOB: 1960/07/17  Age: 63 y.o. MRN: [id number]  Chief Complaint  Patient presents with   Sinusitis    Sinus headache, runny nose, drainage , cough and ear pain     Patient here today for sinus concerns:  headaches, nasal congestion, ST and cough intermittent.  She has been taking OTC sinus and allergy medication that has a decongestant in it which may be elevating her BP.  Normally it is  130/70's and today it is 140's over 80's.  She is agreeable to stopping the decongestant and taking antihistamine and nasal spray.    Sinusitis    No other concerns at this time.   Past Medical History:  Diagnosis Date   Abnormal facial hair 05/11/2011   Anxiety and depression    GAD (generalized anxiety disorder)    Headache    Heart murmur    ECHO 06/15/22: SAM of anterior MV leaflet with dynamic LVOT gradient in setting of moderate symmetrical LVH and hyperdynamic LVF, Peak resting gradient mild at 24 mmHg   Hypertension    Insomnia    Psychotic disorder (HCC)    Seizure disorder (HCC)    stress related seizures. 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[facility])     Past Surgical History:  Procedure Laterality Date   ABDOMINAL HYSTERECTOMY     BREAST BIOPSY Left 08/24/2022   US  LT BREAST BX W LOC DEV 1ST LESION IMG BX SPEC US  GUIDE 08/24/2022 [facility]-ULTRASOUND   CESAREAN SECTION     CHOLECYSTECTOMY     COLONOSCOPY WITH PROPOFOL  N/A 02/15/2019   Procedure: COLONOSCOPY WITH PROPOFOL ;  Surgeon: Shaaron Lamar HERO, MD;  Location: [facility] ENDO SUITE;  Service: Endoscopy;  Laterality: N/A;  2:45pm   POLYPECTOMY  02/15/2019   Procedure: POLYPECTOMY;  Surgeon: Shaaron Lamar HERO, MD;  Location: [facility] ENDO SU[facility];  Service: Endoscopy;;   TOTAL HIP ARTHROPLASTY Left 01/27/2022   Procedure: TOTAL HIP ARTHROPLASTY ANTERIOR APPROACH;  Surgeon: Jerri Kay HERO, MD;  Location: [facility] OR;  Service: Orthopedics;  Laterality:  Left;   VAGUS NERVE STIMULATOR INSERTION Left 02/17/2023   Procedure: VAGAL NERVE STIMULATOR IMPLANT;  Surgeon: Lanis Pupa, MD;  Location: [facility] OR;  Service: Neurosurgery;  Laterality: Left;  3C    Social History   Socioeconomic History   Marital status: Married    Spouse name: Jerel   Number of children: 2   Years of education: 12th   Highest education level: 12th grade  Occupational History   Occupation: disabled  Tobacco Use   Smoking status: Never   Smokeless tobacco: Never  Vaping Use   Vaping status: Never Used  Substance and Sexual Activity   Alcohol use: No   Drug use: No   Sexual activity: Yes    Birth control/protection: Surgical  Other Topics Concern   Not on file  Social History Narrative   Right handed   Caffeine-1 cup daily   Lives with husband      Oldest son passed away in 08-Oct-2021   Social Drivers of Health   Financial Resource Strain: Low Risk  (07/08/2022)   Overall Financial Resource Strain (CARDIA)    Difficulty of Paying Living Expenses: Not hard at all  Food Insecurity: No Food Insecurity (08/11/2023)   Hunger Vital Sign    Worried About Running Out of Food in the Last Year: Never true    Ran Out of  Food in the Last Year: Never true  Transportation Needs: No Transportation Needs (08/11/2023)   PRAPARE - Administrator, Civil Service (Medical): No    Lack of Transportation (Non-Medical): No  Physical Activity: Inactive (07/08/2022)   Exercise Vital Sign    Days of Exercise per Week: 0 days    Minutes of Exercise per Session: 0 min  Stress: No Stress Concern Present (07/08/2022)   [employer] of Occupational Health - Occupational Stress Questionnaire    Feeling of Stress : Not at all  Social Connections: Socially Integrated (08/11/2023)   Social Connection and Isolation Panel    Frequency of Communication with Friends and Family: Three times a week    Frequency of Social Gatherings with Friends and Family: Once a week    Attends  Religious Services: More than 4 times per year    Active Member of [employer] or Organizations: Yes    Attends Engineer, structural: More than 4 times per year    Marital Status: Married  Catering manager Violence: Not At Risk (08/11/2023)   Humiliation, Afraid, Rape, and Kick questionnaire    Fear of Current or Ex-Partner: No    Emotionally Abused: No    Physically Abused: No    Sexually Abused: No    Family History  Problem Relation Age of Onset   Breast cancer Mother 65   Coronary artery disease Father    Stroke Father 35   Breast cancer Sister 24   Breast cancer Sister    Breast cancer Sister    Diabetes Brother    Heart attack Brother    Colon cancer Neg Hx     Allergies  Allergen Reactions   Aspirin Other (See Comments)    Allergic to non coated ASA Caused gastric ulcers    Nsaids Other (See Comments)    Directed by MD to not take.    Singulair  [Montelukast  Sodium] Nausea Only and Other (See Comments)    Became sweaty    Outpatient Medications Prior to Visit  Medication Sig   acetaminophen  (TYLENOL ) 500 MG tablet Take 1,000 mg by mouth every 6 (six) hours as needed for moderate pain.   alendronate  (FOSAMAX ) 35 MG tablet TAKE 1 TABLET BY MOUTH ONCE WEEKLY IN THE MORNING with full GLASS of water  ON an EMPTY stomach DO not lay down FOR 30 mins   amLODipine  (NORVASC ) 2.5 MG tablet TAKE 1 TABLET BY MOUTH DAILY   Calcium Carbonate (CALCIUM 500 PO) Take 1 tablet by mouth daily.   Camphor-Eucalyptus-Menthol  (VICKS VAPORUB EX) Apply 1 Application topically as needed (congestion.).   clonazePAM  (KLONOPIN ) 1 MG tablet Take 1 tablet (1 mg total) by mouth 2 (two) times daily.   fluticasone  (FLONASE ) 50 MCG/ACT nasal spray Place 2 sprays into both nostrils daily.   Humidifier MISC Large humidifier.   levETIRAcetam  (KEPPRA ) 500 MG tablet Take 4 tablets (2,000 mg total) by mouth 2 (two) times daily.   metoprolol  tartrate (LOPRESSOR ) 25 MG tablet TAKE 1 TABLET BY MOUTH TWICE  DAILY   mirtazapine  (REMERON ) 45 MG tablet TAKE 1 TABLET BY MOUTH AT BEDTIME   Multiple Vitamin (MULTIVITAMIN) tablet Take 1 tablet by mouth in the morning.   ondansetron  (ZOFRAN ) 4 MG tablet TAKE ONE TABLET BY MOUTH TWICE DAILY AS NEEDED FOR VOMITING   PHENObarbital  (LUMINAL) 100 MG tablet TAKE 2 TABLETS BY MOUTH AT BEDTIME   potassium chloride  (KLOR-CON  M) 10 MEQ tablet Take 10 mEq by mouth 2 (two) times daily.  sodium chloride  1 g tablet Take 1 tablet (1 g total) by mouth 2 (two) times daily with a meal.   No facility-administered medications prior to visit.    ROS     Objective:   BP (!) 142/83   Pulse (!) 114   Ht 5' 2 (1.575 m)   Wt 102 lb (46.3 kg)   SpO2 94%   BMI 18.66 kg/m   Vitals:   09/22/23 1319  BP: (!) 142/83  Pulse: (!) 114  Height: 5' 2 (1.575 m)  Weight: 102 lb (46.3 kg)  SpO2: 94%  BMI (Calculated): 18.65    Physical Exam Vitals and nursing note reviewed.  Constitutional:      Appearance: Normal appearance.  HENT:     Head: Normocephalic.     Right Ear: Tympanic membrane normal.     Left Ear: Tympanic membrane normal.     Nose: Nose normal.     Mouth/Throat:     Mouth: Mucous membranes are moist.  Cardiovascular:     Rate and Rhythm: Normal rate and regular rhythm.     Pulses: Normal pulses.     Heart sounds: Normal heart sounds.  Pulmonary:     Effort: Pulmonary effort is normal.     Breath sounds: Normal breath sounds.  Musculoskeletal:        General: Normal range of motion.     Cervical back: Normal range of motion and neck supple.  Skin:    General: Skin is warm and dry.  Neurological:     Mental Status: She is alert and oriented to person, place, and time.  Psychiatric:        Mood and Affect: Mood normal.        Behavior: Behavior normal.      No results found for any visits on 09/22/23.  Recent Results (from the past 2160 hours)  Lipid panel     Status: None   Collection Time: 07/14/23  3:02 PM  Result Value Ref  Range   Cholesterol, Total 159 100 - 199 mg/dL   Triglycerides 88 0 - 149 mg/dL   HDL 59 >60 mg/dL   VLDL Cholesterol Cal 16 5 - 40 mg/dL   LDL Chol Calc (NIH) 84 0 - 99 mg/dL   Chol/HDL Ratio 2.7 0.0 - 4.4 ratio    Comment:                                   T. Chol/HDL Ratio                                             Men  Women                               1/2 Avg.Risk  3.4    3.3                                   Avg.Risk  5.0    4.4                                2X Avg.Risk  9.6  7.1                                3X Avg.Risk 23.4   11.0   CMP14+EGFR     Status: Abnormal   Collection Time: 07/14/23  3:02 PM  Result Value Ref Range   Glucose 100 (H) 70 - 99 mg/dL   BUN 2 (L) 8 - 27 mg/dL   Creatinine, Ser 9.44 (L) 0.57 - 1.00 mg/dL   eGFR 897 >40 fO/fpw/8.26   BUN/Creatinine Ratio 4 (L) 12 - 28   Sodium 141 134 - 144 mmol/L   Potassium 3.2 (L) 3.5 - 5.2 mmol/L   Chloride 103 96 - 106 mmol/L   CO2 23 20 - 29 mmol/L   Calcium 9.2 8.7 - 10.3 mg/dL   Total Protein 6.8 6.0 - 8.5 g/dL   Albumin 3.9 3.9 - 4.9 g/dL   Globulin, Total 2.9 1.5 - 4.5 g/dL   Bilirubin Total 0.2 0.0 - 1.2 mg/dL   Alkaline Phosphatase 283 (H) 44 - 121 IU/L   AST 33 0 - 40 IU/L   ALT 22 0 - 32 IU/L  Gram Stain w/Sputum Cult Rflx     Status: None   Collection Time: 07/18/23  9:00 AM   Specimen: Sputum   SP  Result Value Ref Range   White Blood Cells None seen    Epithelial Cells Many    Result 1 Comment     Comment: Many gram positive cocci.   Gram Stain Evaluation Comment     Comment: This specimen is of poor quality and is unacceptable for routine bacterial culture.  Many squamous epithelial cells (>24/lpf) are seen on Gram stain evaluation, indicative of oropharyngeal contamination (saliva).  Notify laboratory within 48 hours if culture is still desired, or recollect if clinically indicated.   CBC with Differential     Status: Abnormal   Collection Time: 08/11/23  3:01 PM  Result Value  Ref Range   WBC 6.0 4.0 - 10.5 K/uL   RBC 4.10 3.87 - 5.11 MIL/uL   Hemoglobin 14.0 12.0 - 15.0 g/dL   HCT 58.4 63.9 - 53.9 %   MCV 101.2 (H) 80.0 - 100.0 fL   MCH 34.1 (H) 26.0 - 34.0 pg   MCHC 33.7 30.0 - 36.0 g/dL   RDW 87.0 88.4 - 84.4 %   Platelets 401 (H) 150 - 400 K/uL   nRBC 0.0 0.0 - 0.2 %   Neutrophils Relative % 39 %   Neutro Abs 2.3 1.7 - 7.7 K/uL   Lymphocytes Relative 48 %   Lymphs Abs 2.8 0.7 - 4.0 K/uL   Monocytes Relative 12 %   Monocytes Absolute 0.7 0.1 - 1.0 K/uL   Eosinophils Relative 0 %   Eosinophils Absolute 0.0 0.0 - 0.5 K/uL   Basophils Relative 1 %   Basophils Absolute 0.1 0.0 - 0.1 K/uL   Immature Granulocytes 0 %   Abs Immature Granulocytes 0.02 0.00 - 0.07 K/uL    Comment: Performed at [facility], [address], [place], [place] [address]  Comprehensive metabolic panel     Status: Abnormal   Collection Time: 08/11/23  3:01 PM  Result Value Ref Range   Sodium 125 (L) 135 - 145 mmol/L   Potassium 3.9 3.5 - 5.1 mmol/L   Chloride 93 (L) 98 - 111 mmol/L   CO2 14 (L) 22 - 32 mmol/L   Glucose, Bld 79  70 - 99 mg/dL    Comment: Glucose reference range applies only to samples taken after fasting for at least 8 hours.   BUN <5 (L) 8 - 23 mg/dL   Creatinine, Ser 9.41 0.44 - 1.00 mg/dL   Calcium 9.0 8.9 - 89.6 mg/dL   Total Protein 7.6 6.5 - 8.1 g/dL   Albumin 3.6 3.5 - 5.0 g/dL   AST 48 (H) 15 - 41 U/L   ALT 18 0 - 44 U/L   Alkaline Phosphatase 177 (H) 38 - 126 U/L   Total Bilirubin 1.0 0.0 - 1.2 mg/dL   GFR, Estimated >39 >39 mL/min    Comment: (NOTE) Calculated using the CKD-EPI Creatinine Equation (2021)    Anion gap 18 (H) 5 - 15    Comment: Performed at [facility], [address] [place], [place] [address]  Resp panel by RT-PCR (RSV, Flu A&B, Covid) Anterior Nasal Swab     Status: None   Collection Time: 08/11/23  4:50 PM   Specimen: Anterior Nasal Swab  Result Value Ref Range   SARS Coronavirus 2 by RT PCR NEGATIVE NEGATIVE    Comment:  (NOTE) SARS-CoV-2 target nucleic acids are NOT DETECTED.  The SARS-CoV-2 RNA is generally detectable in upper respiratory specimens during the acute phase of infection. The lowest concentration of SARS-CoV-2 viral copies this assay can detect is 138 copies/mL. A negative result does not preclude SARS-Cov-2 infection and should not be used as the sole basis for treatment or other patient management decisions. A negative result may occur with  improper specimen collection/handling, submission of specimen other than nasopharyngeal swab, presence of viral mutation(s) within the areas targeted by this assay, and inadequate number of viral copies(<138 copies/mL). A negative result must be combined with clinical observations, patient history, and epidemiological information. The expected result is Negative.  Fact Sheet for Patients:  BloggerCourse.com  Fact Sheet for Healthcare Providers:  SeriousBroker.it  This test is no t yet approved or cleared by the [place]  FDA and  has been authorized for detection and/or diagnosis of SARS-CoV-2 by FDA under an Emergency Use Authorization (EUA). This EUA will remain  in effect (meaning this test can be used) for the duration of the COVID-19 declaration under Section 564(b)(1) of the Act, 21 U.S.C.section 360bbb-3(b)(1), unless the authorization is terminated  or revoked sooner.       Influenza A by PCR NEGATIVE NEGATIVE   Influenza B by PCR NEGATIVE NEGATIVE    Comment: (NOTE) The Xpert Xpress SARS-CoV-2/FLU/RSV plus assay is intended as an aid in the diagnosis of influenza from Nasopharyngeal swab specimens and should not be used as a sole basis for treatment. Nasal washings and aspirates are unacceptable for Xpert Xpress SARS-CoV-2/FLU/RSV testing.  Fact Sheet for Patients: BloggerCourse.com  Fact Sheet for Healthcare  Providers: SeriousBroker.it  This test is not yet approved or cleared by the [place]  FDA and has been authorized for detection and/or diagnosis of SARS-CoV-2 by FDA under an Emergency Use Authorization (EUA). This EUA will remain in effect (meaning this test can be used) for the duration of the COVID-19 declaration under Section 564(b)(1) of the Act, 21 U.S.C. section 360bbb-3(b)(1), unless the authorization is terminated or revoked.     Resp Syncytial Virus by PCR NEGATIVE NEGATIVE    Comment: (NOTE) Fact Sheet for Patients: BloggerCourse.com  Fact Sheet for Healthcare Providers: SeriousBroker.it  This test is not yet approved or cleared by the [place]  FDA and has been authorized for detection and/or  diagnosis of SARS-CoV-2 by FDA under an Emergency Use Authorization (EUA). This EUA will remain in effect (meaning this test can be used) for the duration of the COVID-19 declaration under Section 564(b)(1) of the Act, 21 U.S.C. section 360bbb-3(b)(1), unless the authorization is terminated or revoked.  Performed at [facility], [address], [place], [place] [address]   Lactic acid, plasma     Status: None   Collection Time: 08/11/23  6:03 PM  Result Value Ref Range   Lactic Acid, Venous 0.8 0.5 - 1.9 mmol/L    Comment: Performed at [facility], [address], [place], [place] [address]  Urinalysis, Routine w reflex microscopic -Urine, Clean Catch     Status: Abnormal   Collection Time: 08/11/23  6:25 PM  Result Value Ref Range   Color, Urine STRAW (A) YELLOW   APPearance CLEAR CLEAR   Specific Gravity, Urine 1.003 (L) 1.005 - 1.030   pH 6.0 5.0 - 8.0   Glucose, UA NEGATIVE NEGATIVE mg/dL   Hgb urine dipstick MODERATE (A) NEGATIVE   Bilirubin Urine NEGATIVE NEGATIVE   Ketones, ur 20 (A) NEGATIVE mg/dL   Protein, ur NEGATIVE NEGATIVE mg/dL   Nitrite NEGATIVE NEGATIVE   Leukocytes,Ua  NEGATIVE NEGATIVE   RBC / HPF 0-5 0 - 5 RBC/hpf   WBC, UA 0-5 0 - 5 WBC/hpf   Bacteria, UA NONE SEEN NONE SEEN   Squamous Epithelial / HPF 0-5 0 - 5 /HPF   Mucus PRESENT     Comment: Performed at [facility], [address] [place], [place] [address]  Osmolality, urine     Status: Abnormal   Collection Time: 08/11/23  6:25 PM  Result Value Ref Range   Osmolality, Ur 85 (L) 300 - 900 mOsm/kg    Comment: Performed at [facility], [address], [place], [place] [address]  Sodium, urine, random     Status: None   Collection Time: 08/11/23  6:25 PM  Result Value Ref Range   Sodium, Ur 11 mmol/L    Comment: Performed at [facility], [address], [place], [place] [address]  Group A Strep by PCR     Status: None   Collection Time: 08/11/23  6:40 PM   Specimen: Throat; Sterile Swab  Result Value Ref Range   Group A Strep by PCR NOT DETECTED NOT DETECTED    Comment: Performed at [facility], [address], [place], [place] [address]  MRSA Next Gen by PCR, Nasal     Status: None   Collection Time: 08/12/23  1:35 AM   Specimen: Nasal Mucosa; Nasal Swab  Result Value Ref Range   MRSA by PCR Next Gen NOT DETECTED NOT DETECTED    Comment: (NOTE) The GeneXpert MRSA Assay (FDA approved for NASAL specimens only), is one component of a comprehensive MRSA colonization surveillance program. It is not intended to diagnose MRSA infection nor to guide or monitor treatment for MRSA infections. Test performance is not FDA approved in patients less than 17 years old. Performed at [facility], [address] [place], [place] [address]   Osmolality     Status: None   Collection Time: 08/12/23  4:40 AM  Result Value Ref Range   Osmolality 291 275 - 295 mOsm/kg    Comment: Performed at [facility], [address], [place], [place] [address]  Vitamin B12     Status: None   Collection Time: 08/12/23  4:40 AM  Result Value Ref Range  Vitamin B-12 469 180 - 914 pg/mL    Comment:  (NOTE) This assay is not validated for testing neonatal or myeloproliferative syndrome specimens for Vitamin B12 levels. Performed at [facility], [address] [place], [place] [address]   Folate     Status: Abnormal   Collection Time: 08/12/23  4:40 AM  Result Value Ref Range   Folate 3.4 (L) >5.9 ng/mL    Comment: Performed at [facility], [address] [place], [place] [address]  HIV Antibody (routine testing w rflx)     Status: None   Collection Time: 08/12/23  4:40 AM  Result Value Ref Range   HIV Screen 4th Generation wRfx Non Reactive Non Reactive    Comment: Performed at [facility], [address] [place], [place] [address]  Comprehensive metabolic panel     Status: Abnormal   Collection Time: 08/12/23  4:40 AM  Result Value Ref Range   Sodium 133 (L) 135 - 145 mmol/L    Comment: DELTA CHECK NOTED   Potassium 3.5 3.5 - 5.1 mmol/L   Chloride 106 98 - 111 mmol/L   CO2 18 (L) 22 - 32 mmol/L   Glucose, Bld 127 (H) 70 - 99 mg/dL    Comment: Glucose reference range applies only to samples taken after fasting for at least 8 hours.   BUN <5 (L) 8 - 23 mg/dL   Creatinine, Ser 9.45 0.44 - 1.00 mg/dL   Calcium 8.4 (L) 8.9 - 10.3 mg/dL   Total Protein 6.6 6.5 - 8.1 g/dL   Albumin 3.1 (L) 3.5 - 5.0 g/dL   AST 51 (H) 15 - 41 U/L   ALT 20 0 - 44 U/L   Alkaline Phosphatase 153 (H) 38 - 126 U/L   Total Bilirubin 0.8 0.0 - 1.2 mg/dL   GFR, Estimated >39 >39 mL/min    Comment: (NOTE) Calculated using the CKD-EPI Creatinine Equation (2021)    Anion gap 9 5 - 15    Comment: Performed at [facility], [address], [place], [place] [address]  CBC     Status: None   Collection Time: 08/12/23  4:40 AM  Result Value Ref Range   WBC 6.0 4.0 - 10.5 K/uL   RBC 3.91 3.87 - 5.11 MIL/uL   Hemoglobin 13.0 12.0 - 15.0 g/dL   HCT 60.8 63.9 - 53.9 %   MCV 100.0 80.0 - 100.0 fL   MCH 33.2 26.0 - 34.0 pg   MCHC 33.2 30.0 - 36.0 g/dL   RDW 86.8 88.4 - 84.4 %   Platelets 369 150 -  400 K/uL   nRBC 0.0 0.0 - 0.2 %    Comment: Performed at [facility], [address] [place], [place] [address]  Magnesium      Status: None   Collection Time: 08/12/23  4:40 AM  Result Value Ref Range   Magnesium  2.1 1.7 - 2.4 mg/dL    Comment: Performed at [facility], [address] [place], [place] [address]  Phosphorus     Status: Abnormal   Collection Time: 08/12/23  4:40 AM  Result Value Ref Range   Phosphorus 1.6 (L) 2.5 - 4.6 mg/dL    Comment: Performed at [facility], [address] [place], [place] [address]  Comprehensive metabolic panel with GFR     Status: Abnormal   Collection Time: 08/13/23  3:26 AM  Result Value Ref Range   Sodium 139 135 - 145 mmol/L   Potassium 3.0 (L) 3.5 -  5.1 mmol/L   Chloride 107 98 - 111 mmol/L   CO2 22 22 - 32 mmol/L   Glucose, Bld 105 (H) 70 - 99 mg/dL    Comment: Glucose reference range applies only to samples taken after fasting for at least 8 hours.   BUN <5 (L) 8 - 23 mg/dL   Creatinine, Ser 9.63 (L) 0.44 - 1.00 mg/dL   Calcium 8.3 (L) 8.9 - 10.3 mg/dL   Total Protein 6.1 (L) 6.5 - 8.1 g/dL   Albumin 2.9 (L) 3.5 - 5.0 g/dL   AST 47 (H) 15 - 41 U/L   ALT 20 0 - 44 U/L   Alkaline Phosphatase 145 (H) 38 - 126 U/L   Total Bilirubin 0.7 0.0 - 1.2 mg/dL   GFR, Estimated >39 >39 mL/min    Comment: (NOTE) Calculated using the CKD-EPI Creatinine Equation (2021)    Anion gap 10 5 - 15    Comment: Performed at [facility], [address], [place], [place] [address]  CMP14+EGFR     Status: Abnormal   Collection Time: 08/17/23 11:41 AM  Result Value Ref Range   Glucose 107 (H) 70 - 99 mg/dL   BUN 12 8 - 27 mg/dL   Creatinine, Ser 9.49 (L) 0.57 - 1.00 mg/dL   eGFR 894 >40 fO/fpw/8.26   BUN/Creatinine Ratio 24 12 - 28   Sodium 131 (L) 134 - 144 mmol/L   Potassium 4.5 3.5 - 5.2 mmol/L   Chloride 97 96 - 106 mmol/L   CO2 15 (L) 20 - 29 mmol/L   Calcium 7.9 (L) 8.7 - 10.3 mg/dL   Total Protein 6.6 6.0 - 8.5 g/dL   Albumin 3.8 (L) 3.9 -  4.9 g/dL   Globulin, Total 2.8 1.5 - 4.5 g/dL   Bilirubin Total 0.2 0.0 - 1.2 mg/dL   Alkaline Phosphatase 198 (H) 44 - 121 IU/L   AST 57 (H) 0 - 40 IU/L   ALT 23 0 - 32 IU/L  Basic Metabolic Panel     Status: Abnormal   Collection Time: 08/30/23  4:31 PM  Result Value Ref Range   Glucose 99 70 - 99 mg/dL   BUN 3 (L) 8 - 27 mg/dL   Creatinine, Ser 9.46 (L) 0.57 - 1.00 mg/dL   eGFR 896 >40 fO/fpw/8.26   BUN/Creatinine Ratio 6 (L) 12 - 28   Sodium 125 (L) 134 - 144 mmol/L   Potassium 4.7 3.5 - 5.2 mmol/L   Chloride 89 (L) 96 - 106 mmol/L   CO2 20 20 - 29 mmol/L   Calcium 9.9 8.7 - 10.3 mg/dL      Assessment & Plan:   1) Allergic rhinitis - fluticasone  NS and cetirizine  2) Maxillary sinusitis - augmentin  3) refilled mirtazapine   Problem List Items Addressed This Visit   None   No follow-ups on file.   Total time spent: 20 minutes  Neale Carpen, NP  09/22/2023   This document may have been prepared by [facility] Voice Recognition software and as such may include unintentional dictation errors.

## 2023-09-23 DIAGNOSIS — E871 Hypo-osmolality and hyponatremia: Secondary | ICD-10-CM | POA: Diagnosis not present

## 2023-09-23 DIAGNOSIS — G43909 Migraine, unspecified, not intractable, without status migrainosus: Secondary | ICD-10-CM | POA: Diagnosis not present

## 2023-09-23 DIAGNOSIS — G4700 Insomnia, unspecified: Secondary | ICD-10-CM | POA: Diagnosis not present

## 2023-09-23 DIAGNOSIS — E8729 Other acidosis: Secondary | ICD-10-CM | POA: Diagnosis not present

## 2023-09-23 DIAGNOSIS — I10 Essential (primary) hypertension: Secondary | ICD-10-CM | POA: Diagnosis not present

## 2023-09-23 DIAGNOSIS — Z96642 Presence of left artificial hip joint: Secondary | ICD-10-CM | POA: Diagnosis not present

## 2023-09-23 DIAGNOSIS — M810 Age-related osteoporosis without current pathological fracture: Secondary | ICD-10-CM | POA: Diagnosis not present

## 2023-09-23 DIAGNOSIS — J320 Chronic maxillary sinusitis: Secondary | ICD-10-CM | POA: Diagnosis not present

## 2023-09-23 DIAGNOSIS — R296 Repeated falls: Secondary | ICD-10-CM | POA: Diagnosis not present

## 2023-09-23 DIAGNOSIS — Z7983 Long term (current) use of bisphosphonates: Secondary | ICD-10-CM | POA: Diagnosis not present

## 2023-09-23 DIAGNOSIS — H9202 Otalgia, left ear: Secondary | ICD-10-CM | POA: Diagnosis not present

## 2023-09-23 DIAGNOSIS — Z79899 Other long term (current) drug therapy: Secondary | ICD-10-CM | POA: Diagnosis not present

## 2023-09-30 DIAGNOSIS — H9202 Otalgia, left ear: Secondary | ICD-10-CM | POA: Diagnosis not present

## 2023-09-30 DIAGNOSIS — I10 Essential (primary) hypertension: Secondary | ICD-10-CM | POA: Diagnosis not present

## 2023-09-30 DIAGNOSIS — E8729 Other acidosis: Secondary | ICD-10-CM | POA: Diagnosis not present

## 2023-09-30 DIAGNOSIS — Z7983 Long term (current) use of bisphosphonates: Secondary | ICD-10-CM | POA: Diagnosis not present

## 2023-09-30 DIAGNOSIS — G4700 Insomnia, unspecified: Secondary | ICD-10-CM | POA: Diagnosis not present

## 2023-09-30 DIAGNOSIS — Z79899 Other long term (current) drug therapy: Secondary | ICD-10-CM | POA: Diagnosis not present

## 2023-09-30 DIAGNOSIS — J320 Chronic maxillary sinusitis: Secondary | ICD-10-CM | POA: Diagnosis not present

## 2023-09-30 DIAGNOSIS — Z96642 Presence of left artificial hip joint: Secondary | ICD-10-CM | POA: Diagnosis not present

## 2023-09-30 DIAGNOSIS — G43909 Migraine, unspecified, not intractable, without status migrainosus: Secondary | ICD-10-CM | POA: Diagnosis not present

## 2023-09-30 DIAGNOSIS — R296 Repeated falls: Secondary | ICD-10-CM | POA: Diagnosis not present

## 2023-09-30 DIAGNOSIS — M810 Age-related osteoporosis without current pathological fracture: Secondary | ICD-10-CM | POA: Diagnosis not present

## 2023-10-04 ENCOUNTER — Telehealth: Payer: Self-pay

## 2023-10-04 NOTE — Telephone Encounter (Signed)
 Copied from CRM [id number]. Topic: Clinical - Medication Question >> Oct 04, 2023  9:27 AM Carlyon D wrote: Reason for CRM: pt would like a call back from Dr. Antonetta nurse in regards to medication and ensure she has been taking. Please reach out to pt.

## 2023-10-04 NOTE — Telephone Encounter (Signed)
 Pt called requesting ensure, pt was given what we had left

## 2023-10-07 ENCOUNTER — Telehealth: Payer: Self-pay | Admitting: Family Medicine

## 2023-10-07 DIAGNOSIS — Z79899 Other long term (current) drug therapy: Secondary | ICD-10-CM | POA: Diagnosis not present

## 2023-10-07 DIAGNOSIS — R296 Repeated falls: Secondary | ICD-10-CM | POA: Diagnosis not present

## 2023-10-07 DIAGNOSIS — Z7983 Long term (current) use of bisphosphonates: Secondary | ICD-10-CM | POA: Diagnosis not present

## 2023-10-07 DIAGNOSIS — G4700 Insomnia, unspecified: Secondary | ICD-10-CM | POA: Diagnosis not present

## 2023-10-07 DIAGNOSIS — M810 Age-related osteoporosis without current pathological fracture: Secondary | ICD-10-CM | POA: Diagnosis not present

## 2023-10-07 DIAGNOSIS — H9202 Otalgia, left ear: Secondary | ICD-10-CM | POA: Diagnosis not present

## 2023-10-07 DIAGNOSIS — E871 Hypo-osmolality and hyponatremia: Secondary | ICD-10-CM | POA: Diagnosis not present

## 2023-10-07 DIAGNOSIS — J320 Chronic maxillary sinusitis: Secondary | ICD-10-CM | POA: Diagnosis not present

## 2023-10-07 DIAGNOSIS — E8729 Other acidosis: Secondary | ICD-10-CM | POA: Diagnosis not present

## 2023-10-07 DIAGNOSIS — G43909 Migraine, unspecified, not intractable, without status migrainosus: Secondary | ICD-10-CM | POA: Diagnosis not present

## 2023-10-07 DIAGNOSIS — Z96642 Presence of left artificial hip joint: Secondary | ICD-10-CM | POA: Diagnosis not present

## 2023-10-07 DIAGNOSIS — I10 Essential (primary) hypertension: Secondary | ICD-10-CM | POA: Diagnosis not present

## 2023-10-07 NOTE — Telephone Encounter (Signed)
 Lvm to cb. Health Aide is not covered by Medicare which is what she has. Would need to contact social services to see if they can help

## 2023-10-07 NOTE — Telephone Encounter (Signed)
 Copied from CRM [id number]. Topic: Referral - Question >> Oct 06, 2023  5:03 PM DeAngela L wrote: Reason for CRM: patient calling to ask if she can have a referral for a home healthcare person come to her house and help with daily chores in the house   Pt num [phone] (M)

## 2023-10-10 ENCOUNTER — Ambulatory Visit (HOSPITAL_COMMUNITY)
Admission: RE | Admit: 2023-10-10 | Discharge: 2023-10-10 | Disposition: A | Discharge status: Home / Self Care | Source: Ambulatory Visit | Attending: Physician Assistant | Admitting: Physician Assistant

## 2023-10-10 ENCOUNTER — Encounter (HOSPITAL_COMMUNITY): Payer: Self-pay

## 2023-10-10 DIAGNOSIS — M542 Cervicalgia: Secondary | ICD-10-CM | POA: Diagnosis not present

## 2023-10-10 MED ORDER — IOHEXOL 300 MG/ML  SOLN
75.0000 mL | Freq: Once | INTRAMUSCULAR | Status: AC | PRN
  Administered 2023-10-10: 75 mL via INTRAVENOUS

## 2023-10-12 ENCOUNTER — Telehealth: Payer: Self-pay

## 2023-10-12 NOTE — Telephone Encounter (Signed)
 Copied from CRM [id number]. Topic: Clinical - Medication Question >> Oct 12, 2023 11:34 AM Precious C wrote: Reason for CRM: Pt called requesting a prescription for Covid test, also requesting a follow up confirmation call.

## 2023-10-13 DIAGNOSIS — Z79899 Other long term (current) drug therapy: Secondary | ICD-10-CM | POA: Diagnosis not present

## 2023-10-13 DIAGNOSIS — H9202 Otalgia, left ear: Secondary | ICD-10-CM | POA: Diagnosis not present

## 2023-10-13 DIAGNOSIS — Z96642 Presence of left artificial hip joint: Secondary | ICD-10-CM | POA: Diagnosis not present

## 2023-10-13 DIAGNOSIS — G4700 Insomnia, unspecified: Secondary | ICD-10-CM | POA: Diagnosis not present

## 2023-10-13 DIAGNOSIS — E8729 Other acidosis: Secondary | ICD-10-CM | POA: Diagnosis not present

## 2023-10-13 DIAGNOSIS — M810 Age-related osteoporosis without current pathological fracture: Secondary | ICD-10-CM | POA: Diagnosis not present

## 2023-10-13 DIAGNOSIS — Z7983 Long term (current) use of bisphosphonates: Secondary | ICD-10-CM | POA: Diagnosis not present

## 2023-10-13 DIAGNOSIS — I10 Essential (primary) hypertension: Secondary | ICD-10-CM | POA: Diagnosis not present

## 2023-10-13 DIAGNOSIS — E871 Hypo-osmolality and hyponatremia: Secondary | ICD-10-CM | POA: Diagnosis not present

## 2023-10-13 DIAGNOSIS — J320 Chronic maxillary sinusitis: Secondary | ICD-10-CM | POA: Diagnosis not present

## 2023-10-13 DIAGNOSIS — G43909 Migraine, unspecified, not intractable, without status migrainosus: Secondary | ICD-10-CM | POA: Diagnosis not present

## 2023-10-13 DIAGNOSIS — R296 Repeated falls: Secondary | ICD-10-CM | POA: Diagnosis not present

## 2023-10-14 ENCOUNTER — Other Ambulatory Visit: Payer: Self-pay | Admitting: Family Medicine

## 2023-10-14 DIAGNOSIS — M810 Age-related osteoporosis without current pathological fracture: Secondary | ICD-10-CM

## 2023-10-17 ENCOUNTER — Other Ambulatory Visit: Payer: Self-pay

## 2023-10-17 DIAGNOSIS — Z23 Encounter for immunization: Secondary | ICD-10-CM

## 2023-10-17 MED ORDER — SPIKEVAX 50 MCG/0.5ML IM SUSY: 0.5000 mL | PREFILLED_SYRINGE | Freq: Once | INTRAMUSCULAR | 0 refills | Status: AC

## 2023-10-17 NOTE — Telephone Encounter (Signed)
 Rx sent left vm informing pt rx was sent .

## 2023-10-18 ENCOUNTER — Telehealth (INDEPENDENT_AMBULATORY_CARE_PROVIDER_SITE_OTHER): Payer: Self-pay | Admitting: Physician Assistant

## 2023-10-18 NOTE — Telephone Encounter (Signed)
 Attempted to call the patient to discuss her CT results.  No answer I left a voicemail.

## 2023-10-24 ENCOUNTER — Telehealth (INDEPENDENT_AMBULATORY_CARE_PROVIDER_SITE_OTHER): Payer: Self-pay | Admitting: Physician Assistant

## 2023-10-24 NOTE — Telephone Encounter (Signed)
 Second attempt to discuss the CT results with the patient, left voicemail.

## 2023-10-25 ENCOUNTER — Ambulatory Visit: Attending: Internal Medicine | Admitting: Internal Medicine

## 2023-10-25 ENCOUNTER — Telehealth: Payer: Self-pay

## 2023-10-25 ENCOUNTER — Encounter: Payer: Self-pay | Admitting: Internal Medicine

## 2023-10-25 VITALS — BP 118/72 | HR 66 | Ht 62.0 in | Wt 100.8 lb

## 2023-10-25 DIAGNOSIS — I517 Cardiomegaly: Secondary | ICD-10-CM | POA: Diagnosis not present

## 2023-10-25 DIAGNOSIS — I479 Paroxysmal tachycardia, unspecified: Secondary | ICD-10-CM

## 2023-10-25 DIAGNOSIS — R002 Palpitations: Secondary | ICD-10-CM | POA: Diagnosis not present

## 2023-10-25 NOTE — Telephone Encounter (Signed)
 Copied from CRM #[id number]. Topic: Clinical - Lab/Test Results >> Oct 25, 2023  1:10 PM Dawna HERO wrote: Reason for CRM: pt calling about CT results

## 2023-10-25 NOTE — Patient Instructions (Signed)
 Medication Instructions:  Your physician recommends that you continue on your current medications as directed. Please refer to the Current Medication list given to you today.   Labwork: None  Testing/Procedures: Your physician has requested that you have an echocardiogram. Echocardiography is a painless test that uses sound waves to create images of your heart. It provides your doctor with information about the size and shape of your heart and how well your heart's chambers and valves are working. This procedure takes approximately one hour. There are no restrictions for this procedure. Please do NOT wear cologne, perfume, aftershave, or lotions (deodorant is allowed). Please arrive 15 minutes prior to your appointment time.  Please note: We ask at that you not bring children with you during ultrasound (echo/ vascular) testing. Due to room size and safety concerns, children are not allowed in the ultrasound rooms during exams. Our front office staff cannot provide observation of children in our lobby area while testing is being conducted. An adult accompanying a patient to their appointment will only be allowed in the ultrasound room at the discretion of the ultrasound technician under special circumstances. We apologize for any inconvenience.   Follow-Up: Your physician recommends that you schedule a follow-up appointment in: 1 year. You will receive a reminder call in about 8 months reminding you to schedule your appointment. If you don't receive this call, please contact our office.   Any Other Special Instructions Will Be Listed Below (If Applicable).  Thank you for choosing [facility]!      If you need a refill on your cardiac medications before your next appointment, please call your pharmacy.

## 2023-10-25 NOTE — Progress Notes (Signed)
 Cardiology Office Note  Date: 10/25/2023   ID: Micaiah, Remillard 10-08-1960, MRN [id number]  PCP:  Antonetta Rollene BRAVO, MD  Cardiologist:  Diannah SHAUNNA Maywood, MD Electrophysiologist:  None    History of Present Illness: Halie Gass is a 63 y.o. female known to have HTN, anxiety is here for follow-up visit.  Patient used to be active prior to the hip fracture in 01/2022, used to make 22 laps around her gym.  But after hip fracture, hip replacement surgery and undergoing physical therapy sessions, she is currently able to do 1 lap and slowly trying to catch her.  Not much active due to the hip fracture, walking with a walker.  Event monitor from 4/24 showed NSR ranging between 59 and 151 bpm with average HR 80 bpm, patient symptoms correlated with NSR (87 to 139 bpm) and ventricular ectopy. <1% PAC and <1% PVC burden. Echocardiogram from 5/24 showed SAM with a dynamic LVOT gradient in the setting of moderate symmetrical LVH and hyperdynamic LV function with peak resting gradient 24 mmHg, mild.  She was started on metoprolol .  She is here today for follow-up visit.  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  Patient underwent placement of left vagal nerve stimulator in January 2025.  Since then, she noticed having more shortness of breath compared to before.  She also endorses dizziness occurring frequently.  Vitals today remarkable for pulse rate 66 bpm.  No chest pain.  No syncope, leg swelling.  She does have occasional palpitations lasting for about few minutes.    Past Medical History:  Diagnosis Date   Abnormal facial hair 05/11/2011   Anxiety and depression    GAD (generalized anxiety disorder)    Headache    Heart murmur    ECHO 06/15/22: SAM of anterior MV leaflet with dynamic LVOT gradient in setting of moderate  symmetrical LVH and hyperdynamic LVF, Peak resting gradient mild at 24 mmHg   Hypertension    Insomnia    Psychotic disorder (HCC)    Seizure disorder (HCC)    stress related seizures. 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[facility])     Past Surgical History:  Procedure Laterality Date   ABDOMINAL HYSTERECTOMY     BREAST BIOPSY Left 08/24/2022   US  LT BREAST BX W LOC DEV 1ST LESION IMG BX SPEC US  GUIDE 08/24/2022 [facility]-ULTRASOUND   CESAREAN SECTION     CHOLECYSTECTOMY     COLONOSCOPY WITH PROPOFOL  N/A 02/15/2019   Procedure: COLONOSCOPY WITH PROPOFOL ;  Surgeon: Shaaron Lamar HERO, MD;  Location: [facility];  Service: Endoscopy;  Laterality: N/A;  2:45pm   POLYPECTOMY  02/15/2019   Procedure: POLYPECTOMY;  Surgeon: Shaaron Lamar HERO, MD;  Location: [facility];  Service: Endoscopy;;   TOTAL HIP ARTHROPLASTY Left 01/27/2022   Procedure: TOTAL HIP ARTHROPLASTY ANTERIOR APPROACH;  Surgeon: Jerri Kay HERO, MD;  Location: [facility] OR;  Service: Orthopedics;  Laterality: Left;   VAGUS NERVE STIMULATOR INSERTION Left 02/17/2023   Procedure: VAGAL NERVE STIMULATOR IMPLANT;  Surgeon: Lanis Pupa, MD;  Location: [facility] OR;  Service: Neurosurgery;  Laterality: Left;  3C    Current Outpatient Medications  Medication Sig Dispense Refill   acetaminophen  (TYLENOL ) 500 MG tablet Take 1,000 mg by mouth every 6 (six) hours as needed for moderate pain.     alendronate  (  FOSAMAX ) 35 MG tablet TAKE 1 TABLET BY MOUTH ONCE WEEKLY IN THE MORNING with full GLASS of water  ON an EMPTY stomach DO not lay down FOR 30 mins 5 tablet 5   amLODipine  (NORVASC ) 2.5 MG tablet TAKE 1 TABLET BY MOUTH DAILY 90 tablet 1   Calcium Carbonate (CALCIUM 500 PO) Take 1 tablet by mouth daily.     Camphor-Eucalyptus-Menthol  (VICKS VAPORUB EX) Apply 1 Application topically as needed (congestion.).     cetirizine  (ZYRTEC ) 10 MG tablet Take 1 tablet (10 mg total) by mouth daily. 30 tablet 11   clonazePAM  (KLONOPIN ) 1 MG tablet  Take 1 tablet (1 mg total) by mouth 2 (two) times daily. 60 tablet 5   fluticasone  (FLONASE ) 50 MCG/ACT nasal spray Place 2 sprays into both nostrils daily. 16 g 6   Humidifier MISC Large humidifier. 1 each 0   levETIRAcetam  (KEPPRA ) 500 MG tablet Take 4 tablets (2,000 mg total) by mouth 2 (two) times daily. 720 tablet 3   metoprolol  tartrate (LOPRESSOR ) 25 MG tablet TAKE 1 TABLET BY MOUTH TWICE DAILY 180 tablet 1   mirtazapine  (REMERON ) 45 MG tablet Take 1 tablet (45 mg total) by mouth at bedtime. 30 tablet 5   Multiple Vitamin (MULTIVITAMIN) tablet Take 1 tablet by mouth in the morning.     ondansetron  (ZOFRAN ) 4 MG tablet TAKE ONE TABLET BY MOUTH TWICE DAILY AS NEEDED FOR VOMITING 10 tablet 0   PHENObarbital  (LUMINAL) 100 MG tablet TAKE 2 TABLETS BY MOUTH AT BEDTIME 180 tablet 3   potassium chloride  (KLOR-CON  M) 10 MEQ tablet Take 10 mEq by mouth 2 (two) times daily.     sodium chloride  1 g tablet Take 1 tablet (1 g total) by mouth 2 (two) times daily with a meal. 60 tablet 3   No current facility-administered medications for this visit.   Allergies:  Aspirin, Nsaids, and Singulair  [montelukast  sodium]   Social History: The patient  reports that she has never smoked. She has never used smokeless tobacco. She reports that she does not drink alcohol and does not use drugs.   Family History: The patient's family history includes Breast cancer in her sister and sister; Breast cancer (age of onset: 62) in her mother; Breast cancer (age of onset: 56) in her sister; Coronary artery disease in her father; Diabetes in her brother; Heart attack in her brother; Stroke (age of onset: 64) in her father.   ROS:  Please see the history of present illness. Otherwise, complete review of systems is positive for none  All other systems are reviewed and negative.   Physical Exam: VS:  BP 118/72   Pulse 66   Ht 5' 2 (1.575 m)   Wt 100 lb 12.8 oz (45.7 kg)   SpO2 100%   BMI 18.44 kg/m , BMI Body mass  index is 18.44 kg/m.  Wt Readings from Last 3 Encounters:  10/25/23 100 lb 12.8 oz (45.7 kg)  09/22/23 102 lb (46.3 kg)  09/01/23 103 lb 0.6 oz (46.7 kg)    General: Patient appears comfortable at rest. HEENT: Conjunctiva and lids normal, oropharynx clear with moist mucosa. Neck: Supple, no elevated JVP or carotid bruits, no thyromegaly. Lungs: Clear to auscultation, nonlabored breathing at rest. Cardiac: Regular rate and rhythm, holosystolic murmur, Grade 3/6 Abdomen: Soft, nontender, no hepatomegaly, bowel sounds present, no guarding or rebound. Extremities: No pitting edema, distal pulses 2+. Skin: Warm and dry. Musculoskeletal: No kyphosis. Neuropsychiatric: Alert and oriented x3, affect grossly appropriate.  Recent  Labwork: 08/12/2023: Hemoglobin 13.0; Magnesium  2.1; Platelets 369 08/17/2023: ALT 23; AST 57 08/30/2023: BUN 3; Creatinine, Ser 0.53; Potassium 4.7; Sodium 125     Component Value Date/Time   CHOL 159 07/14/2023 1502   TRIG 88 07/14/2023 1502   HDL 59 07/14/2023 1502   CHOLHDL 2.7 07/14/2023 1502   CHOLHDL 2.8 05/15/2019 0949   VLDL 17 01/07/2016 1153   LDLCALC 84 07/14/2023 1502   LDLCALC 86 05/15/2019 0949     Assessment and Plan:  # Palpitations # Dizziness - Event monitor from 4/24 showed NSR ranging between 59 and 151 bpm with average HR 80 bpm, patient symptoms correlated with NSR (87 to 139 bpm) and ventricular ectopy. <1% PAC and <1% PVC burden.  - Patient has occasional palpitations and frequent dizziness.  Likely noncardiac.  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  - Continue metoprolol  tartrate 25 mg twice daily. - Update echocardiogram.  # HTN, controlled: Continue amlodipine  2.5 mg once daily, metoprolol  tartrate 25 mg twice daily.  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   I have spent a total of 30 minutes with patient reviewing chart, EKGs, labs and examining patient as well as establishing an assessment and plan that was discussed with the patient.   Medication Adjustments/Labs and Tests Ordered: Current medicines are reviewed at length with the patient today.  Concerns regarding medicines are outlined above.   Tests Ordered: Orders Placed This Encounter  Procedures   EKG 12-Lead    Medication Changes: No orders of the defined types were placed in this encounter.   Disposition:  Follow up 1 year  Signed Willie Loy Arleta Maywood, MD, 10/25/2023 9:50 AM    [facility] [address] [place], [place], [place] [address]

## 2023-10-31 ENCOUNTER — Telehealth (INDEPENDENT_AMBULATORY_CARE_PROVIDER_SITE_OTHER): Payer: Self-pay | Admitting: Physician Assistant

## 2023-10-31 ENCOUNTER — Telehealth (INDEPENDENT_AMBULATORY_CARE_PROVIDER_SITE_OTHER): Payer: Self-pay

## 2023-10-31 NOTE — Telephone Encounter (Signed)
 I spoke with Sandra Riley today about her CT results, reassuring no significant abnormalities noted.  Unclear etiology of neck pain, no alarming findings.  She does have some discomfort along the VNG region, she may speak with the surgeon who placed this to see if this could be a source of her ongoing symptoms.  She was given return precautions.  She verbalized understanding and agreement to today's plan.

## 2023-10-31 NOTE — Telephone Encounter (Signed)
Thanks I spoke with her

## 2023-10-31 NOTE — Telephone Encounter (Signed)
 Patient left a message requesting her CT results

## 2023-11-01 ENCOUNTER — Telehealth: Payer: Self-pay | Admitting: Neurology

## 2023-11-01 NOTE — Telephone Encounter (Signed)
 Patient called to confirm appointment.

## 2023-11-08 ENCOUNTER — Encounter: Payer: Self-pay | Admitting: Neurology

## 2023-11-08 ENCOUNTER — Ambulatory Visit (INDEPENDENT_AMBULATORY_CARE_PROVIDER_SITE_OTHER): Admitting: Neurology

## 2023-11-08 VITALS — BP 130/76 | HR 79 | Ht 62.0 in | Wt 98.5 lb

## 2023-11-08 DIAGNOSIS — E871 Hypo-osmolality and hyponatremia: Secondary | ICD-10-CM | POA: Diagnosis not present

## 2023-11-08 DIAGNOSIS — Z5181 Encounter for therapeutic drug level monitoring: Secondary | ICD-10-CM

## 2023-11-08 DIAGNOSIS — Z9689 Presence of other specified functional implants: Secondary | ICD-10-CM

## 2023-11-08 DIAGNOSIS — G40219 Localization-related (focal) (partial) symptomatic epilepsy and epileptic syndromes with complex partial seizures, intractable, without status epilepticus: Secondary | ICD-10-CM

## 2023-11-08 NOTE — Patient Instructions (Signed)
 Continue phenobarbital  200 mg nightly Continue with Keppra  2000 mg twice daily Continue to swipe her VNS twice daily Will obtain lab today including sodium level.  I will contact you to go over the result Follow-up in 2 months or sooner if worse.

## 2023-11-08 NOTE — Progress Notes (Signed)
 [facility]  PATIENT: Sandra Riley DOB: 04/13/1960  REQUESTING CLINICIAN: Antonetta Rollene BRAVO, MD HISTORY FROM: Patient, husband and chart review  REASON FOR VISIT: Seizure disorder follow up    HISTORICAL  CHIEF COMPLAINT:  Chief Complaint  Patient presents with   Seizures    Rm13, Husband present, sz: last sz occurred 2 nights ago. Pt denied missing sz med.     INTERVAL HISTORY 11/08/2023 Patient presents today for follow-up, she is accompanied by her husband.  Last visit was in July, since then she tells me she has been doing okay, continues to have seizures, last 1 was 4 days ago.  She also reports nocturnal seizure that she described as waking up feeling tired, waking up with a headache and feels weak for the entire day. The husband did not report generalized convulsion during sleep.   She is compliant with her medications including phenobarbital  200 mg at night and Keppra  2000 mg twice daily.  Her last sodium was low at 125, I did prescribe her salt tablet 1 g to take twice daily but she did not pick up the medication.  She tells me that she is using some over-the-counter salt tablets. She continues to Swipe her VNS daily, still reports cough.   She also report this past week she has been dealing with diarrhea, nausea vomiting, she went to urgent care but did not see her PCP.  She tells me during urgent care visit she did not have any blood work done   INTERVAL HISTORY 08/30/2023 Sandra Riley presents today for follow-up, she is accompanied by her husband.  Last visit was in May, at that time we continued her medications, and increased her VNS settings.  Since then, she told me she was was admitted to the hospital for hyponatremia.  However, she was told hyponatremia likely related to levetiracetam  and levetiracetam  was decreased from 2000 mg twice daily to 1000 mg twice daily.  Since the decrease of the levetiracetam , she has been having more headaches, and  that she has been having more seizures.  She reports nocturnal seizures because she will wake up in the morning nauseated, feeling off balance and having bad headaches.  These were similar symptoms that she used to have following a seizure.  She also reported on 1 occasion she was unresponsive when husband was talking to her.  She is experiencing more headaches.  She would like to go back to her previous dose of Keppra .  She has been following up with her PCP, had subsequent sodium level checked and still showing hyponatremia.  She tells me that she drinks 2 Gatorade a day but reports poor appetite, not eating well. When it comes to the VNS, reports pain with magnet stimulation   INTERVAL HISTORY 06/06/2023:  Patient presents today for follow up. Last visit was in March, at that time, we continue the VNS titration, tolerated the procedure well. She tells me that she has 2 light seizures, last one a last week. Her main complaint at this time is throat pain due to seasonal allergies. She confirms with me that it is not related to her VNS.    INTERVAL HISTORY 04/19/2023:  Patient presents today for follow-up, last visit was in February 4.  Since then she has been doing well, she tells me that she had 2 light seizures, on February 15 and the last 1 on March 3.  With the seizures she feels lightheaded, dizzy and sometimes there is confusion.  She  is tolerating the VNS settings well, also on swipe at least once a day.  No other complaints, no other concerns.   INTERVAL HISTORY 03/08/2023:  Patient presents today for follow up. She is accompanied by her husband. Last visit was in OctobeR, at that time we have increase her Phenobarbital  to 200 mg nightly and Keppra  to 2000 mg twice daily. She continued to have breakthrough seizures. She had her VNS implanted on January 16, reports one seizure since implantation of VNS, possibly due to missed medication.   INTERVAL HISTORY 11/18/2022 Patient presents today for  follow-up, last visit was in January, since then, she tells me that she had her hip surgery but continues to have breakthrough seizures.  She reports at least 2 seizures during her sleep.  Husband is not aware of the seizures but patient tells me that when she wakes up in the morning, she feels tired, feels drowsy, weak and has a bad taste in her mouth and that is how she knows she had a seizure.   Husband tells me about once a month she will have episode of behavioral arrest, unresponsiveness, looking like she is in a trance.  She is on phenobarbital  200 mg nightly Monday to Friday and 100 mg nightly Saturday and Sunday and Keppra  1500 mg twice daily but still having breakthrough seizure. Her routine EEG showed left temporal epileptiform discharges.    HISTORY OF PRESENT ILLNESS:  This is a 63 year old woman past medical history of seizure disorder, anxiety, depression, osteoporosis, recent fall with hip fracture who is presenting to establish care.  Patient reports a long history of seizure diagnosed at the age of 63.  Initially she describes her seizures as generalized convulsion with tongue biting.  He was on phenobarbital  and Dilantin .  She reported her seizure was uncontrolled until she initially follow-up with Sandra Riley who added levetiracetam .  Since being on levetiracetam  and phenobarbital  she has not had any generalized convulsion and this is more than 20 years ago.  Now she is reporting seizure that she described as disorientation and losing track of time and confusion.  She still having those and they have been worse in the past year since her son died\.  Currently she is on phenobarbital  97.2 mg 2 tabs nightly and Keppra  1500 mg twice daily.  While in rehab, she was told that her phenobarbital  level was elevated therefore it was decreased to 1 tablet on Saturday and Sunday.  She denies any symptoms  associated with phenobarb toxicity and she reports that her last seizure that she described as  disorientation and losing track of time was in December 2023 since then she has not had any additional seizures.  She is tolerating the medications very well and denies any side effect. She denies any seizure risk factors, denies any family history of seizures denies any injury from seizures. Her fall and hip fracture was not related to a seizure.   Handedness: Right handed   Onset: At the age of 23  Seizure Type: Disorientation, losing track of time, last convulsion more than 20 year ago   Current frequency: Last seiure was in Dec 2023  Any injuries from seizures: Tongue biting   Seizure risk factors: Denies   Previous ASMs: Phenobarbital , Levetiracetam , Dilantin ,   Currenty ASMs: Phenobarbital  200 mg nightly, and Levetiracetam  2000 mg twice daily  ASMs side effects: None   Brain Images: None available for review   Previous EEGs: EEG 2013: Normal    OTHER MEDICAL CONDITIONS: Seizure  disorder, recent fall and hip fracture, Hypertension, osteoporosis, anxiety/depression   REVIEW OF SYSTEMS: Full 14 system review of systems performed and negative with exception of: As noted in the HPI   ALLERGIES: Allergies  Allergen Reactions   Aspirin Other (See Comments)    Allergic to non coated ASA Caused gastric ulcers    Nsaids Other (See Comments)    Directed by MD to not take.    Singulair  [Montelukast  Sodium] Nausea Only and Other (See Comments)    Became sweaty    HOME MEDICATIONS: Outpatient Medications Prior to Visit  Medication Sig Dispense Refill   acetaminophen  (TYLENOL ) 500 MG tablet Take 1,000 mg by mouth every 6 (six) hours as needed for moderate pain.     alendronate  (FOSAMAX ) 35 MG tablet TAKE 1 TABLET BY MOUTH ONCE WEEKLY IN THE MORNING with full GLASS of water  ON an EMPTY stomach DO not lay down FOR 30 mins 5 tablet 5   amLODipine  (NORVASC ) 2.5 MG tablet TAKE 1 TABLET BY MOUTH DAILY 90 tablet 1   amoxicillin -clavulanate (AUGMENTIN ) 875-125 MG tablet Take 1 tablet  by mouth 2 (two) times daily.     azelastine (ASTELIN) 0.1 % nasal spray Place 2 sprays into both nostrils 2 (two) times daily.     Calcium Carbonate (CALCIUM 500 PO) Take 1 tablet by mouth daily.     Camphor-Eucalyptus-Menthol  (VICKS VAPORUB EX) Apply 1 Application topically as needed (congestion.).     cetirizine  (ZYRTEC ) 10 MG tablet Take 1 tablet (10 mg total) by mouth daily. 30 tablet 11   clonazePAM  (KLONOPIN ) 1 MG tablet Take 1 tablet (1 mg total) by mouth 2 (two) times daily. 60 tablet 5   fluticasone  (FLONASE ) 50 MCG/ACT nasal spray Place 2 sprays into both nostrils daily. 16 g 6   Humidifier MISC Large humidifier. 1 each 0   levETIRAcetam  (KEPPRA ) 500 MG tablet Take 4 tablets (2,000 mg total) by mouth 2 (two) times daily. 720 tablet 3   metoprolol  tartrate (LOPRESSOR ) 25 MG tablet TAKE 1 TABLET BY MOUTH TWICE DAILY 180 tablet 1   mirtazapine  (REMERON ) 45 MG tablet Take 1 tablet (45 mg total) by mouth at bedtime. 30 tablet 5   Multiple Vitamin (MULTIVITAMIN) tablet Take 1 tablet by mouth in the morning.     ondansetron  (ZOFRAN ) 4 MG tablet TAKE ONE TABLET BY MOUTH TWICE DAILY AS NEEDED FOR VOMITING 10 tablet 0   PHENObarbital  (LUMINAL) 100 MG tablet TAKE 2 TABLETS BY MOUTH AT BEDTIME 180 tablet 3   potassium chloride  (KLOR-CON  M) 10 MEQ tablet Take 10 mEq by mouth 2 (two) times daily.     sodium chloride  1 g tablet Take 1 tablet (1 g total) by mouth 2 (two) times daily with a meal. 60 tablet 3   No facility-administered medications prior to visit.    PAST MEDICAL HISTORY: Past Medical History:  Diagnosis Date   Abnormal facial hair 05/11/2011   Anxiety and depression    GAD (generalized anxiety disorder)    Headache    Heart murmur    ECHO 06/15/22: SAM of anterior MV leaflet with dynamic LVOT gradient in setting of moderate symmetrical LVH and hyperdynamic LVF, Peak resting gradient mild at 24 mmHg   Hypertension    Insomnia    Psychotic disorder (HCC)    Seizure disorder  (HCC)    stress related seizures. 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(HCC)     PAST SURGICAL HISTORY: Past Surgical History:  Procedure Laterality Date   ABDOMINAL HYSTERECTOMY     BREAST BIOPSY Left 08/24/2022   US  LT BREAST BX W LOC DEV 1ST LESION IMG BX SPEC US  GUIDE 08/24/2022 [facility]UL[facility]   CESAREAN SECTION     CHOLECYSTECTOMY     COLONOSCOPY WITH PROPOFOL  N/A 02/15/2019   Procedure: COLONOSCOPY WITH PROPOFOL ;  Surgeon: Shaaron Lamar HERO, MD;  Location: [facility];  Service: Endoscopy;  Laterality: N/A;  2:45pm   POLYPECTOMY  02/15/2019   Procedure: POLYPECTOMY;  Surgeon: Shaaron Lamar HERO, MD;  Location: [facility];  Service: Endoscopy;;   TOTAL HIP ARTHROPLASTY Left 01/27/2022   Procedure: TOTAL HIP ARTHROPLASTY ANTERIOR APPROACH;  Surgeon: Jerri Kay HERO, MD;  Location: [facility] OR;  Service: Orthopedics;  Laterality: Left;   VAGUS NERVE STIMULATOR INSERTION Left 02/17/2023   Procedure: VAGAL NERVE STIMULATOR IMPLANT;  Surgeon: Lanis Pupa, MD;  Location: [facility] OR;  Service: Neurosurgery;  Laterality: Left;  3C    FAMILY HISTORY: Family History  Problem Relation Age of Onset   Breast cancer Mother 73   Coronary artery disease Father    Stroke Father 47   Breast cancer Sister 84   Breast cancer Sister    Breast cancer Sister    Diabetes Brother    Heart attack Brother    Colon cancer Neg Hx     SOCIAL HISTORY: Social History   Socioeconomic History   Marital status: Married    Spouse name: Jerel   Number of children: 2   Years of education: 12th   Highest education level: 12th grade  Occupational History   Occupation: disabled  Tobacco Use   Smoking status: Never   Smokeless tobacco: Never  Vaping Use   Vaping status: Never Used  Substance and Sexual Activity   Alcohol use: No   Drug use: No   Sexual activity: Yes    Birth control/protection: Surgical  Other Topics Concern   Not on file  Social History Narrative   Right handed    Caffeine-1 cup daily   Lives with husband      Oldest son passed away in 12/03/2021   Social Drivers of Health   Financial Resource Strain: Low Risk  (07/08/2022)   Overall Financial Resource Strain (CARDIA)    Difficulty of Paying Living Expenses: Not hard at all  Food Insecurity: No Food Insecurity (08/11/2023)   Hunger Vital Sign    Worried About Running Out of Food in the Last Year: Never true    Ran Out of Food in the Last Year: Never true  Transportation Needs: No Transportation Needs (08/11/2023)   PRAPARE - Administrator, Civil Service (Medical): No    Lack of Transportation (Non-Medical): No  Physical Activity: Inactive (07/08/2022)   Exercise Vital Sign    Days of Exercise per Week: 0 days    Minutes of Exercise per Session: 0 min  Stress: No Stress Concern Present (07/08/2022)   [employer] of Occupational Health - Occupational Stress Questionnaire    Feeling of Stress : Not at all  Social Connections: Socially Integrated (08/11/2023)   Social Connection and Isolation Panel    Frequency of Communication with Friends and Family: Three times a week    Frequency of Social Gatherings with Friends and Family: Once a week    Attends Religious Services: More than 4 times per year    Active Member of [employer] or Organizations: Yes    Attends Banker Meetings: More than 4 times per  year    Marital Status: Married  Catering manager Violence: Not At Risk (08/11/2023)   Humiliation, Afraid, Rape, and Kick questionnaire    Fear of Current or Ex-Partner: No    Emotionally Abused: No    Physically Abused: No    Sexually Abused: No    PHYSICAL EXAM  GENERAL EXAM/CONSTITUTIONAL: Vitals:  Vitals:   11/08/23 1527  BP: 130/76  Pulse: 79  SpO2: 98%  Weight: 98 lb 8 oz (44.7 kg)  Height: 5' 2 (1.575 m)   Body mass index is 18.02 kg/m. Wt Readings from Last 3 Encounters:  11/08/23 98 lb 8 oz (44.7 kg)  10/25/23 100 lb 12.8 oz (45.7 kg)  09/22/23 102 lb (46.3  kg)   Patient is in no distress; well developed, nourished and groomed; neck is supple  MUSCULOSKELETAL: Gait, strength, tone, movements noted in Neurologic exam below  NEUROLOGIC: MENTAL STATUS:     07/08/2022   11:02 AM  MMSE - Mini Mental State Exam  Not completed: Unable to complete   awake, alert, oriented to person, place and time recent and remote memory intact normal attention and concentration language fluent, comprehension intact, naming intact fund of knowledge appropriate  CRANIAL NERVE:  2nd, 3rd, 4th, 6th - Visual fields full to confrontation, extraocular muscles intact, no nystagmus 5th - facial sensation symmetric 7th - facial strength symmetric 8th - hearing intact 9th - palate elevates symmetrically, uvula midline 11th - shoulder shrug symmetric 12th - tongue protrusion midline  MOTOR:  normal bulk and tone, at least antigravity in the BUE and BLEs  SENSORY:  normal and symmetric to light touch  COORDINATION:  finger-nose-finger, fine finger movements normal  GAIT/STATION:  Using a rollator     DIAGNOSTIC DATA (LABS, IMAGING, TESTING) - I reviewed patient records, labs, notes, testing and imaging myself where available.  Lab Results  Component Value Date   WBC 6.0 08/12/2023   HGB 13.0 08/12/2023   HCT 39.1 08/12/2023   MCV 100.0 08/12/2023   PLT 369 08/12/2023      Component Value Date/Time   NA 125 (L) 08/30/2023 1631   K 4.7 08/30/2023 1631   CL 89 (L) 08/30/2023 1631   CO2 20 08/30/2023 1631   GLUCOSE 99 08/30/2023 1631   GLUCOSE 105 (H) 08/13/2023 0326   BUN 3 (L) 08/30/2023 1631   CREATININE 0.53 (L) 08/30/2023 1631   CREATININE 0.49 (L) 11/29/2019 0926   CALCIUM 9.9 08/30/2023 1631   PROT 6.6 08/17/2023 1141   ALBUMIN 3.8 (L) 08/17/2023 1141   AST 57 (H) 08/17/2023 1141   ALT 23 08/17/2023 1141   ALKPHOS 198 (H) 08/17/2023 1141   BILITOT 0.2 08/17/2023 1141   GFRNONAA >60 08/13/2023 0326   GFRNONAA 105 11/29/2019 0926    GFRAA 120 02/27/2020 1508   GFRAA 122 11/29/2019 0926   Lab Results  Component Value Date   CHOL 159 07/14/2023   HDL 59 07/14/2023   LDLCALC 84 07/14/2023   TRIG 88 07/14/2023   Lab Results  Component Value Date   HGBA1C 5.6 02/27/2020   Lab Results  Component Value Date   VITAMINB12 469 08/12/2023   Lab Results  Component Value Date   TSH 1.730 05/11/2022    EEG 2013: Normal   EEG 2024: Left temporal sharps    ASSESSMENT AND PLAN  63 y.o. year old female  with history of longstanding seizure disorder, anxiety/depression, recent fall with left hip fracture s/p surgery who is presenting for follow up.  Her recent EEG showed left temporal discharges. She continues to have breakthrough seizures despite phenobarbital  200 mg nightly and Levetiracetam  2000 mg twice daily. She is now status post VNS implantation on January 16 , 2025.  She continues to report nocturnal seizure.  Plan will be for patient to continue current medications and to continue cycling her VNS twice daily. For hyponatremia, I will recheck the sodium level today and if low will ask her to take the prescribed salt tablet.  Advised her to contact me for any other questions or concerns, otherwise I will see her in 2 months for follow-up or sooner if worse.  At next visit plan will be to increase her VNS settings    1. Therapeutic drug monitoring   2. Localization-related symptomatic epilepsy and epileptic syndromes with complex partial seizures, intractable, without status epilepticus (HCC)   3. S/P placement of VNS (vagus nerve stimulation) device   4. Hyponatremia      Patient Instructions  Continue phenobarbital  200 mg nightly Continue with Keppra  2000 mg twice daily Continue to swipe her VNS twice daily Will obtain lab today including sodium level.  I will contact you to go over the result Follow-up in 2 months or sooner if worse.   Per [place] statutes, patients with seizures are not  allowed to drive until they have been seizure-free for six months.  Other recommendations include using caution when using heavy equipment or power tools. Avoid working on ladders or at heights. Take showers instead of baths.  Do not swim alone.  Ensure the water  temperature is not too high on the home water  heater. Do not go swimming alone. Do not lock yourself in a room alone (i.e. bathroom). When caring for infants or small children, sit down when holding, feeding, or changing them to minimize risk of injury to the child in the event you have a seizure. Maintain good sleep hygiene. Avoid alcohol.  Also recommend adequate sleep, hydration, good diet and minimize stress.   During the Seizure  - First, ensure adequate ventilation and place patients on the floor on their left side  Loosen clothing around the neck and ensure the airway is patent. If the patient is clenching the teeth, do not force the mouth open with any object as this can cause severe damage - Remove all items from the surrounding that can be hazardous. The patient may be oblivious to what's happening and may not even know what he or she is doing. If the patient is confused and wandering, either gently guide him/her away and block access to outside areas - Reassure the individual and be comforting - Call 911. In most cases, the seizure ends before EMS arrives. However, there are cases when seizures may last over 3 to 5 minutes. Or the individual may have developed breathing difficulties or severe injuries. If a pregnant patient or a person with diabetes develops a seizure, it is prudent to call an ambulance. - Finally, if the patient does not regain full consciousness, then call EMS. Most patients will remain confused for about 45 to 90 minutes after a seizure, so you must use judgment in calling for help. - Avoid restraints but make sure the patient is in a bed with padded side rails - Place the individual in a lateral position with the  neck slightly flexed; this will help the saliva drain from the mouth and prevent the tongue from falling backward - Remove all nearby furniture and other hazards from the area -  Provide verbal assurance as the individual is regaining consciousness - Provide the patient with privacy if possible - Call for help and start treatment as ordered by the caregiver   After the Seizure (Postictal Stage)  After a seizure, most patients experience confusion, fatigue, muscle pain and/or a headache. Thus, one should permit the individual to sleep. For the next few days, reassurance is essential. Being calm and helping reorient the person is also of importance.  Most seizures are painless and end spontaneously. Seizures are not harmful to others but can lead to complications such as stress on the lungs, brain and the heart. Individuals with prior lung problems may develop labored breathing and respiratory distress.     Orders Placed This Encounter  Procedures   CBC with Differential/Platelets   CMP   C-reactive Protein   Sedimentation Rate    No orders of the defined types were placed in this encounter.   Return in about 11 weeks (around 01/24/2024).  The patient's condition of epilepsy requires frequent monitoring and adjustments in the treatment plan, reflecting the ongoing complexity of care.  This provider is the continuing focal point for all needed services for this condition.   Pastor Falling, MD 11/08/2023, 5:08 PM  [facility] [address], [address] [place], [place] [address] [phone]

## 2023-11-09 ENCOUNTER — Ambulatory Visit: Payer: Self-pay | Admitting: Neurology

## 2023-11-09 ENCOUNTER — Other Ambulatory Visit: Payer: Self-pay | Admitting: Family Medicine

## 2023-11-09 ENCOUNTER — Other Ambulatory Visit: Payer: Self-pay | Admitting: Neurology

## 2023-11-09 DIAGNOSIS — E876 Hypokalemia: Secondary | ICD-10-CM

## 2023-11-09 LAB — CBC WITH DIFFERENTIAL/PLATELET
Basophils Absolute: 0.1 x10E3/uL (ref 0.0–0.2)
Basos: 1 %
EOS (ABSOLUTE): 0 x10E3/uL (ref 0.0–0.4)
Eos: 0 %
Hematocrit: 38 % (ref 34.0–46.6)
Hemoglobin: 13.4 g/dL (ref 11.1–15.9)
Immature Grans (Abs): 0 x10E3/uL (ref 0.0–0.1)
Immature Granulocytes: 0 %
Lymphocytes Absolute: 2.3 x10E3/uL (ref 0.7–3.1)
Lymphs: 33 %
MCH: 35.3 pg — ABNORMAL HIGH (ref 26.6–33.0)
MCHC: 35.3 g/dL (ref 31.5–35.7)
MCV: 100 fL — ABNORMAL HIGH (ref 79–97)
Monocytes Absolute: 1 x10E3/uL — ABNORMAL HIGH (ref 0.1–0.9)
Monocytes: 14 %
Neutrophils Absolute: 3.6 x10E3/uL (ref 1.4–7.0)
Neutrophils: 51 %
Platelets: 564 x10E3/uL — ABNORMAL HIGH (ref 150–450)
RBC: 3.8 x10E6/uL (ref 3.77–5.28)
RDW: 11 % — ABNORMAL LOW (ref 11.7–15.4)
WBC: 7.1 x10E3/uL (ref 3.4–10.8)

## 2023-11-09 LAB — COMPREHENSIVE METABOLIC PANEL WITH GFR
ALT: 16 IU/L (ref 0–32)
AST: 26 IU/L (ref 0–40)
Albumin: 4.1 g/dL (ref 3.9–4.9)
Alkaline Phosphatase: 205 IU/L — ABNORMAL HIGH (ref 49–135)
BUN/Creatinine Ratio: 5 — ABNORMAL LOW (ref 12–28)
BUN: 2 mg/dL — ABNORMAL LOW (ref 8–27)
Bilirubin Total: 0.2 mg/dL (ref 0.0–1.2)
CO2: 23 mmol/L (ref 20–29)
Calcium: 10.2 mg/dL (ref 8.7–10.3)
Chloride: 89 mmol/L — ABNORMAL LOW (ref 96–106)
Creatinine, Ser: 0.42 mg/dL — ABNORMAL LOW (ref 0.57–1.00)
Globulin, Total: 3.4 g/dL (ref 1.5–4.5)
Glucose: 103 mg/dL — ABNORMAL HIGH (ref 70–99)
Potassium: 4.9 mmol/L (ref 3.5–5.2)
Sodium: 125 mmol/L — ABNORMAL LOW (ref 134–144)
Total Protein: 7.5 g/dL (ref 6.0–8.5)
eGFR: 108 mL/min/1.73 (ref >59)

## 2023-11-09 LAB — C-REACTIVE PROTEIN: CRP: 2 mg/L (ref 0–10)

## 2023-11-09 LAB — SEDIMENTATION RATE: Sed Rate: 5 mm/h (ref 0–40)

## 2023-11-09 MED ORDER — SODIUM CHLORIDE 1 G PO TABS: 1.0000 g | ORAL_TABLET | Freq: Two times a day (BID) | ORAL | 3 refills | Status: AC

## 2023-11-10 ENCOUNTER — Ambulatory Visit: Attending: Internal Medicine

## 2023-11-10 DIAGNOSIS — I517 Cardiomegaly: Secondary | ICD-10-CM | POA: Diagnosis not present

## 2023-11-10 LAB — ECHOCARDIOGRAM COMPLETE
AR max vel: 2.58 cm2
AV Peak grad: 9.7 mmHg
Ao pk vel: 1.56 m/s
Area-P 1/2: 2.87 cm2
Calc EF: 70.2 %
S' Lateral: 1.6 cm
Single Plane A2C EF: 67 %
Single Plane A4C EF: 71.3 %

## 2023-11-11 ENCOUNTER — Ambulatory Visit: Payer: Self-pay | Admitting: Internal Medicine

## 2023-11-14 NOTE — Telephone Encounter (Signed)
-----   Message from Vishnu P Mallipeddi sent at 11/11/2023 11:58 AM EDT ----- Normal LV function, severe LVH of the septal segment, small LVOT gradient (12 mmHg at rest, 10 mmHg with Valsalva), G1 DD with elevated LVEDP, CVP 3 mmHg, normal RV function, mild MR.  No changes in  the plan.  Continue metoprolol  at the current dose. ----- Message ----- From: Interface, [employer] One Seven Sent: 11/10/2023   1:39 PM EDT To: Vishnu P Mallipeddi, MD

## 2023-11-14 NOTE — Telephone Encounter (Signed)
 The patient has been notified of the result and verbalized understanding.  All questions (if any) were answered. Littie CHRISTELLA Croak, CMA 11/14/2023 3:27 PM

## 2023-11-15 ENCOUNTER — Ambulatory Visit: Payer: Self-pay

## 2023-11-15 NOTE — Telephone Encounter (Signed)
 Pt informed, verbalized understanding.

## 2023-11-15 NOTE — Telephone Encounter (Signed)
 Patient reports three days of uncontrolled diarrhea-states she is going 4 to 5 times a day. Reports nausea but no vomiting. States she is drinking liquid but eating very little. Endorses feeling unbalanced and a headache as well. Patient states feeling unbalanced started in the last couple of days but reports she is having difficulty going to the grocery store due to feeling unbalanced. Patient is recommended to the ED due to symptoms but patient refused. Patient is wanting medication to help with the diarrhea. Reports use of Pepto Bismol but no improvement in symptoms. Asking for a follow up call from office  FYI Only or Action Required?: Action required by provider: clinical question for provider.  Patient was last seen in primary care on 09/22/2023 by Glennon Sand, NP.  Called Nurse Triage reporting Dizziness, Headache, and Diarrhea.  Symptoms began several days ago.  Interventions attempted: OTC medications: Pepto Bismol and Rest, hydration, or home remedies.  Symptoms are: unchanged.  Triage Disposition: Go to ED Now (or PCP Triage)  Patient/caregiver understands and will follow disposition?: No, refuses disposition  Copied from CRM [id number]. Topic: Clinical - Red Word Triage >> Nov 15, 2023  9:27 AM Tiffany B wrote: Red Word that prompted transfer to Nurse Triage: Patient experiencing uncontrolled diarrhea for 3 days a sore throat and headache and feeling unbalanced. Reason for Disposition  Patient sounds very sick or weak to the triager  Patient sounds very sick or weak to the triager  Patient sounds very sick or weak to the triager  Answer Assessment - Initial Assessment Questions 1. DIARRHEA SEVERITY: How bad is the diarrhea? How many more stools have you had in the past 24 hours than normal?      Going 4-5 times a day 2. ONSET: When did the diarrhea begin?      Started on Saturday 3. STOOL DESCRIPTION:  How loose or watery is the diarrhea? What is the stool color?  Is there any blood or mucous in the stool?     Loose and watery-no blood or mucous 4. VOMITING: Are you also vomiting? If Yes, ask: How many times in the past 24 hours?      No vomiting 5. ABDOMEN PAIN: Are you having any abdomen pain? If Yes, ask: What does it feel like? (e.g., crampy, dull, intermittent, constant)      no 6. ABDOMEN PAIN SEVERITY: If present, ask: How bad is the pain?  (e.g., Scale 1-10; mild, moderate, or severe)     NA 7. ORAL INTAKE: If vomiting, Have you been able to drink liquids? How much liquids have you had in the past 24 hours?     Yes has been able to keep liquids down 8. HYDRATION: Any signs of dehydration? (e.g., dry mouth [not just dry lips], too weak to stand, dizziness, new weight loss) When did you last urinate?     Urinated just recently. Dizziness and weakness 9. EXPOSURE: Have you traveled to a foreign country recently? Have you been exposed to anyone with diarrhea? Could you have eaten any food that was spoiled?     no 10. ANTIBIOTIC USE: Are you taking antibiotics now or have you taken antibiotics in the past 2 months?       no 11. OTHER SYMPTOMS: Do you have any other symptoms? (e.g., fever, blood in stool)       nausea  Answer Assessment - Initial Assessment Questions 1. DESCRIPTION: Describe your dizziness.     Reports feeling unbalanced  2. LIGHTHEADED: Do you  feel lightheaded? (e.g., somewhat faint, woozy, weak upon standing)     Weak upon standing 3. VERTIGO: Do you feel like either you or the room is spinning or tilting? (i.e., vertigo)     no 4. SEVERITY: How bad is it?  Do you feel like you are going to faint? Can you stand and walk?     Mild 5. ONSET:  When did the dizziness begin?     yesterday 6. AGGRAVATING FACTORS: Does anything make it worse? (e.g., standing, change in head position)     With walking 7. HEART RATE: Can you tell me your heart rate? How many beats in 15 seconds?   (Note: Not all patients can do this.)       NA 8. CAUSE: What do you think is causing the dizziness? (e.g., decreased fluids or food, diarrhea, emotional distress, heat exposure, new medicine, sudden standing, vomiting; unknown)     unsure 9. RECURRENT SYMPTOM: Have you had dizziness before? If Yes, ask: When was the last time? What happened that time?     yes 10. OTHER SYMPTOMS: Do you have any other symptoms? (e.g., fever, chest pain, vomiting, diarrhea, bleeding)       diarrhea  Answer Assessment - Initial Assessment Questions 1. LOCATION: Where does it hurt?      Forehead area and the back of head 2. ONSET: When did the headache start? (e.g., minutes, hours, days)      Started this morning 3. PATTERN: Does the pain come and go, or has it been constant since it started?     Comes and goes 4. SEVERITY: How bad is the pain? and What does it keep you from doing?  (e.g., Scale 1-10; mild, moderate, or severe)     7 out of 10 5. RECURRENT SYMPTOM: Have you ever had headaches before? If Yes, ask: When was the last time? and What happened that time?      yes 6. CAUSE: What do you think is causing the headache?     unsure 7. MIGRAINE: Have you been diagnosed with migraine headaches? If Yes, ask: Is this headache similar?      no 8. HEAD INJURY: Has there been any recent injury to your head?      no 9. OTHER SYMPTOMS: Do you have any other symptoms? (e.g., fever, stiff neck, eye pain, sore throat, cold symptoms)     Sore throat  Protocols used: Diarrhea-A-AH, Dizziness - Lightheadedness-A-AH, Headache-A-AH

## 2023-12-05 ENCOUNTER — Other Ambulatory Visit: Payer: Self-pay | Admitting: Internal Medicine

## 2023-12-05 DIAGNOSIS — Z9109 Other allergy status, other than to drugs and biological substances: Secondary | ICD-10-CM

## 2023-12-08 ENCOUNTER — Telehealth: Payer: Self-pay

## 2023-12-08 NOTE — Telephone Encounter (Signed)
 Copied from CRM #[id number]. Topic: Appointments - Appointment Scheduling >> Dec 08, 2023  3:30 PM Antony RAMAN wrote: Patient calling to make an appointment only with her pcp but she's booked far out, her symptoms are losing weight and losing apetite, she asked me to send a msg to the doc to let her know. Did not schedule

## 2023-12-09 ENCOUNTER — Other Ambulatory Visit: Payer: Self-pay | Admitting: Internal Medicine

## 2023-12-09 ENCOUNTER — Other Ambulatory Visit: Payer: Self-pay | Admitting: Family Medicine

## 2023-12-09 DIAGNOSIS — H8110 Benign paroxysmal vertigo, unspecified ear: Secondary | ICD-10-CM

## 2023-12-09 DIAGNOSIS — Z9109 Other allergy status, other than to drugs and biological substances: Secondary | ICD-10-CM

## 2023-12-09 NOTE — Telephone Encounter (Signed)
 Added wait list if cancelation call get patient in sooner

## 2023-12-10 ENCOUNTER — Encounter (HOSPITAL_COMMUNITY): Payer: Self-pay

## 2023-12-10 ENCOUNTER — Emergency Department (HOSPITAL_COMMUNITY)

## 2023-12-10 ENCOUNTER — Other Ambulatory Visit: Payer: Self-pay

## 2023-12-10 ENCOUNTER — Emergency Department (HOSPITAL_COMMUNITY): Admission: EM | Admit: 2023-12-10 | Discharge: 2023-12-10 | Disposition: A | Discharge status: Home / Self Care

## 2023-12-10 DIAGNOSIS — R1110 Vomiting, unspecified: Secondary | ICD-10-CM | POA: Insufficient documentation

## 2023-12-10 DIAGNOSIS — E871 Hypo-osmolality and hyponatremia: Secondary | ICD-10-CM | POA: Insufficient documentation

## 2023-12-10 DIAGNOSIS — R112 Nausea with vomiting, unspecified: Secondary | ICD-10-CM | POA: Diagnosis present

## 2023-12-10 DIAGNOSIS — R1111 Vomiting without nausea: Secondary | ICD-10-CM

## 2023-12-10 LAB — URINALYSIS, ROUTINE W REFLEX MICROSCOPIC
Bacteria, UA: NONE SEEN
Bilirubin Urine: NEGATIVE
Glucose, UA: NEGATIVE mg/dL
Ketones, ur: NEGATIVE mg/dL
Nitrite: NEGATIVE
Protein, ur: NEGATIVE mg/dL
Specific Gravity, Urine: 1.008 (ref 1.005–1.030)
pH: 6 (ref 5.0–8.0)

## 2023-12-10 LAB — COMPREHENSIVE METABOLIC PANEL WITH GFR
ALT: 22 U/L (ref 0–44)
AST: 34 U/L (ref 15–41)
Albumin: 4.1 g/dL (ref 3.5–5.0)
Alkaline Phosphatase: 189 U/L — ABNORMAL HIGH (ref 38–126)
Anion gap: 10 (ref 5–15)
BUN: <5 mg/dL — ABNORMAL LOW (ref 8–23)
CO2: 25 mmol/L (ref 22–32)
Calcium: 9.2 mg/dL (ref 8.9–10.3)
Chloride: 88 mmol/L — ABNORMAL LOW (ref 98–111)
Creatinine, Ser: 0.36 mg/dL — ABNORMAL LOW (ref 0.44–1.00)
GFR, Estimated: >60 mL/min (ref >60)
Glucose, Bld: 99 mg/dL (ref 70–99)
Potassium: 4 mmol/L (ref 3.5–5.1)
Sodium: 122 mmol/L — ABNORMAL LOW (ref 135–145)
Total Bilirubin: 0.4 mg/dL (ref 0.0–1.2)
Total Protein: 7.2 g/dL (ref 6.5–8.1)

## 2023-12-10 LAB — BASIC METABOLIC PANEL WITH GFR
Anion gap: 7 (ref 5–15)
BUN: <5 mg/dL — ABNORMAL LOW (ref 8–23)
CO2: 30 mmol/L (ref 22–32)
Calcium: 9.4 mg/dL (ref 8.9–10.3)
Chloride: 95 mmol/L — ABNORMAL LOW (ref 98–111)
Creatinine, Ser: 0.35 mg/dL — ABNORMAL LOW (ref 0.44–1.00)
GFR, Estimated: >60 mL/min (ref >60)
Glucose, Bld: 92 mg/dL (ref 70–99)
Potassium: 4.5 mmol/L (ref 3.5–5.1)
Sodium: 132 mmol/L — ABNORMAL LOW (ref 135–145)

## 2023-12-10 LAB — CBC
HCT: 35.2 % — ABNORMAL LOW (ref 36.0–46.0)
Hemoglobin: 12.5 g/dL (ref 12.0–15.0)
MCH: 34.9 pg — ABNORMAL HIGH (ref 26.0–34.0)
MCHC: 35.5 g/dL (ref 30.0–36.0)
MCV: 98.3 fL (ref 80.0–100.0)
Platelets: 431 K/uL — ABNORMAL HIGH (ref 150–400)
RBC: 3.58 MIL/uL — ABNORMAL LOW (ref 3.87–5.11)
RDW: 10.8 % — ABNORMAL LOW (ref 11.5–15.5)
WBC: 5.6 K/uL (ref 4.0–10.5)
nRBC: 0 % (ref 0.0–0.2)

## 2023-12-10 LAB — LIPASE, BLOOD: Lipase: 14 U/L (ref 11–51)

## 2023-12-10 MED ORDER — IOHEXOL 300 MG/ML  SOLN
75.0000 mL | Freq: Once | INTRAMUSCULAR | Status: AC | PRN
  Administered 2023-12-10: 75 mL via INTRAVENOUS

## 2023-12-10 MED ORDER — ONDANSETRON HCL 4 MG PO TABS: 4.0000 mg | ORAL_TABLET | Freq: Three times a day (TID) | ORAL | 0 refills | Status: AC | PRN

## 2023-12-10 MED ORDER — ONDANSETRON HCL 4 MG/2ML IJ SOLN
4.0000 mg | Freq: Once | INTRAMUSCULAR | Status: AC | PRN
  Administered 2023-12-10: 4 mg via INTRAVENOUS
  Filled 2023-12-10: qty 2

## 2023-12-10 MED ORDER — ACETAMINOPHEN 325 MG PO TABS
650.0000 mg | ORAL_TABLET | Freq: Once | ORAL | Status: AC
  Administered 2023-12-10: 650 mg via ORAL
  Filled 2023-12-10: qty 2

## 2023-12-10 MED ORDER — ACETAMINOPHEN 500 MG PO TABS: 1000.0000 mg | ORAL_TABLET | Freq: Once | ORAL | Status: DC

## 2023-12-10 MED ORDER — LACTATED RINGERS IV BOLUS
1000.0000 mL | Freq: Once | INTRAVENOUS | Status: AC
  Administered 2023-12-10: 1000 mL via INTRAVENOUS

## 2023-12-10 NOTE — ED Provider Notes (Signed)
 [place] EMERGENCY DEPARTMENT AT [facility] Provider Note   CSN: [id number] Arrival date & time: 12/10/23  9063     Patient presents with: Emesis and Back Pain   Sandra Riley is a 63 y.o. female.   63 year old female presents for evaluation of abdominal pain nausea and vomiting.  States has been vomiting for a week and a half and has had significant weight loss.  States she is unable to keep anything down.  States she has some vague abdominal pain that is diffuse and cramping.  Emesis and diarrhea as well.  Denies any other symptoms or concerns.   Emesis Associated symptoms: abdominal pain   Associated symptoms: no arthralgias, no chills, no cough, no fever and no sore throat   Back Pain Associated symptoms: abdominal pain   Associated symptoms: no chest pain, no dysuria and no fever        Prior to Admission medications   Medication Sig Start Date End Date Taking? Authorizing Provider  ondansetron  (ZOFRAN ) 4 MG tablet Take 1 tablet (4 mg total) by mouth every 8 (eight) hours as needed for up to 4 days. 12/10/23 12/14/23 Yes Natasja Niday L, DO  acetaminophen  (TYLENOL ) 500 MG tablet Take 1,000 mg by mouth every 6 (six) hours as needed for moderate pain.    [provider]  alendronate  (FOSAMAX ) 35 MG tablet TAKE 1 TABLET BY MOUTH ONCE WEEKLY IN THE MORNING with full GLASS of water  ON an EMPTY stomach DO not lay down FOR 30 mins 10/14/23   Antonetta Rollene BRAVO, MD  amLODipine  (NORVASC ) 2.5 MG tablet TAKE 1 TABLET BY MOUTH DAILY 07/19/23   Mallipeddi, Vishnu P, MD  amoxicillin -clavulanate (AUGMENTIN ) 875-125 MG tablet Take 1 tablet by mouth 2 (two) times daily. 11/07/23   [provider]  azelastine (ASTELIN) 0.1 % nasal spray Place 2 sprays into both nostrils 2 (two) times daily. 11/07/23   [provider]  Calcium Carbonate (CALCIUM 500 PO) Take 1 tablet by mouth daily.    [provider]  Camphor-Eucalyptus-Menthol  (VICKS VAPORUB  EX) Apply 1 Application topically as needed (congestion.).    [provider]  cetirizine  (ZYRTEC ) 10 MG tablet Take 1 tablet (10 mg total) by mouth daily. 09/22/23   Glennon Sand, NP  clonazePAM  (KLONOPIN ) 1 MG tablet Take 1 tablet (1 mg total) by mouth 2 (two) times daily. 07/05/23   Antonetta Rollene BRAVO, MD  fluticasone  (FLONASE ) 50 MCG/ACT nasal spray Place 2 sprays into both nostrils daily. 09/22/23   Glennon Sand, NP  Humidifier MISC Large humidifier. 01/13/22   Antonetta Rollene BRAVO, MD  levETIRAcetam  (KEPPRA ) 500 MG tablet Take 4 tablets (2,000 mg total) by mouth 2 (two) times daily. 08/30/23 08/24/24  Camara, Amadou, MD  metoprolol  tartrate (LOPRESSOR ) 25 MG tablet TAKE 1 TABLET BY MOUTH TWICE DAILY 07/19/23   Mallipeddi, Vishnu P, MD  mirtazapine  (REMERON ) 45 MG tablet Take 1 tablet (45 mg total) by mouth at bedtime. 09/22/23   Boswell, Chelsa, NP  Multiple Vitamin (MULTIVITAMIN) tablet Take 1 tablet by mouth in the morning.    [provider]  ondansetron  (ZOFRAN ) 4 MG tablet TAKE ONE TABLET BY MOUTH TWICE DAILY AS NEEDED FOR VOMITING 08/26/23   Antonetta Rollene BRAVO, MD  PHENObarbital  (LUMINAL) 100 MG tablet TAKE 2 TABLETS BY MOUTH AT BEDTIME 09/20/23   Ines Onetha NOVAK, MD  potassium chloride  (KLOR-CON  M) 10 MEQ tablet TAKE 1 TABLET BY MOUTH TWICE DAILY 11/09/23   Tobie Suzzane POUR, MD  sodium chloride  1 g tablet Take 1 tablet (1 g total) by mouth 2 (two) times daily with a meal. 11/09/23   Gregg Lek, MD    Allergies: Aspirin, Nsaids, and Singulair  [montelukast  sodium]    Review of Systems  Constitutional:  Negative for chills and fever.  HENT:  Negative for ear pain and sore throat.   Eyes:  Negative for pain and visual disturbance.  Respiratory:  Negative for cough and shortness of breath.   Cardiovascular:  Negative for chest pain and palpitations.  Gastrointestinal:  Positive for abdominal pain and vomiting.  Genitourinary:  Negative for dysuria and hematuria.   Musculoskeletal:  Positive for back pain. Negative for arthralgias.  Skin:  Negative for color change and rash.  Neurological:  Negative for seizures and syncope.  All other systems reviewed and are negative.   Updated Vital Signs BP (!) 172/81   Pulse 70   Temp 98.1 F (36.7 C) (Oral)   Resp 13   Ht 5' 2 (1.575 m)   Wt 40.8 kg   SpO2 98%   BMI 16.46 kg/m   Physical Exam Vitals and nursing note reviewed.  Constitutional:      General: She is not in acute distress.    Appearance: Normal appearance. She is well-developed. She is not ill-appearing.  HENT:     Head: Normocephalic and atraumatic.  Eyes:     Conjunctiva/sclera: Conjunctivae normal.  Cardiovascular:     Rate and Rhythm: Normal rate and regular rhythm.     Heart sounds: No murmur heard. Pulmonary:     Effort: Pulmonary effort is normal. No respiratory distress.     Breath sounds: Normal breath sounds.  Abdominal:     Palpations: Abdomen is soft.     Tenderness: There is no abdominal tenderness.  Musculoskeletal:        General: No swelling.     Cervical back: Neck supple.  Skin:    General: Skin is warm and dry.     Capillary Refill: Capillary refill takes less than 2 seconds.  Neurological:     Mental Status: She is alert.  Psychiatric:        Mood and Affect: Mood normal.     (all labs ordered are listed, but only abnormal results are displayed) Labs Reviewed  COMPREHENSIVE METABOLIC PANEL WITH GFR - Abnormal; Notable for the following components:      Result Value   Sodium 122 (*)    Chloride 88 (*)    BUN <5 (*)    Creatinine, Ser 0.36 (*)    Alkaline Phosphatase 189 (*)    All other components within normal limits  CBC - Abnormal; Notable for the following components:   RBC 3.58 (*)    HCT 35.2 (*)    MCH 34.9 (*)    RDW 10.8 (*)    Platelets 431 (*)    All other components within normal limits  URINALYSIS, ROUTINE W REFLEX MICROSCOPIC - Abnormal; Notable for the following components:    Color, Urine COLORLESS (*)    Hgb urine dipstick MODERATE (*)    Leukocytes,Ua TRACE (*)    All other components within normal limits  BASIC METABOLIC PANEL WITH GFR - Abnormal; Notable for the following components:   Sodium 132 (*)    Chloride 95 (*)    BUN <5 (*)    Creatinine, Ser 0.35 (*)    All other components within normal limits  LIPASE, BLOOD    EKG: EKG Interpretation Date/Time:  Saturday December 10 2023 10:59:59 EST Ventricular Rate:  68 PR Interval:  210 QRS Duration:  84 QT Interval:  408 QTC Calculation: 434 R Axis:   56  Text Interpretation: Sinus rhythm LAE, consider biatrial enlargement Left ventricular hypertrophy Compared with prior EKG from 10/25/2023 Confirmed by Gennaro Bouchard ([id number]) on 12/10/2023 11:06:39 AM  Radiology: CT ABDOMEN PELVIS W CONTRAST Result Date: 12/10/2023 EXAM: CT ABDOMEN AND PELVIS WITH CONTRAST 12/10/2023 10:48:22 AM TECHNIQUE: CT of the abdomen and pelvis was performed with the administration of 75 mL of iohexol  (OMNIPAQUE ) 300 MG/ML solution. Multiplanar reformatted images are provided for review. Automated exposure control, iterative reconstruction, and/or weight-based adjustment of the mA/kV was utilized to reduce the radiation dose to as low as reasonably achievable. COMPARISON: MR abdomen dated 10/28/2022. CLINICAL HISTORY: abd pain, vomiting, weight loss FINDINGS: LOWER CHEST: Heart size is enlarged. The visualized lung bases are clear. LIVER: There is no focal liver lesion. GALLBLADDER AND BILE DUCTS: Status post cholecystectomy. Fusiform dilatation of the common bile duct measures up to 1.2 cm. On the previous examination this measured 0.8 cm. No calcified stone identified along the course of the common bile duct. Moderate intrahepatic bile duct dilatation identified which is increased from the previous exam. SPLEEN: The spleen is within normal limits in size and appearance. PANCREAS: Pancreas is normal in size and contour without  focal lesion or ductal dilatation. ADRENAL GLANDS: Normal size and morphology bilaterally. No nodule, thickening, or hemorrhage. No periadrenal stranding. KIDNEYS, URETERS AND BLADDER: No stones in the kidneys or ureters. No hydronephrosis. No perinephric or periureteral stranding. Mild diffuse bladder distention. No focal bladder abnormality noted. GI AND BOWEL: Stomach demonstrates no acute abnormality. The appendix is visualized and normal in caliber, without wall thickening, periappendiceal inflammation, or fluid. Enteric contrast material noted within nondilated colon up to the rectum. There is no bowel obstruction. PERITONEUM AND RETROPERITONEUM: No free fluid or fluid collections. No free air. VASCULATURE: Aorta is normal in caliber. Aortic atherosclerosis. LYMPH NODES: No lymphadenopathy. REPRODUCTIVE ORGANS: Status post hysterectomy. No adnexal mass. BONES AND SOFT TISSUES: Status post left hip arthroplasty. Superior endplate deformity involving the L4 vertebra appears similar to the previous exam. No acute or suspicious osseous findings. No focal soft tissue abnormality. IMPRESSION: 1. Progressive fusiform dilatation of the common bile duct measuring up to 1.2 cm, previously 0.8 cm, without calcified choledocholithiasis identified. 2. In the setting of clinical signs and symptoms of biliary obstruction, consider further evaluation with MRCP. In the absence of clinical signs of biliary obstruction, this is favored to represent post-. cholecystectomy physiology. 3. Progressive moderate intrahepatic bile duct dilatation. Electronically signed by: Waddell Calk MD 12/10/2023 11:13 AM EST RP Workstation: [id number]     .Critical Care  Performed by: Gennaro Bouchard CROME, DO Authorized by: Gennaro Bouchard CROME, DO   Critical care provider statement:    Critical care time (minutes):  30   Critical care time was exclusive of:  Separately billable procedures and treating other patients and teaching time    Critical care was necessary to treat or prevent imminent or life-threatening deterioration of the following conditions:  Dehydration and endocrine crisis   Critical care was time spent personally by me on the following activities:  Development of treatment plan with patient or surrogate, discussions with consultants, evaluation of patient's response to treatment, examination of patient, obtaining history from patient or surrogate, review of old charts, re-evaluation of patient's condition, ordering and review of laboratory studies, ordering and review of radiographic studies and  ordering and performing treatments and interventions   Care discussed with comment:  GI consultant    Medications Ordered in the ED  ondansetron  (ZOFRAN ) injection 4 mg (4 mg Intravenous Given 12/10/23 1011)  lactated ringers  bolus 1,000 mL (0 mLs Intravenous Stopped 12/10/23 1337)  iohexol  (OMNIPAQUE ) 300 MG/ML solution 75 mL (75 mLs Intravenous Contrast Given 12/10/23 1036)  acetaminophen  (TYLENOL ) tablet 650 mg (650 mg Oral Given 12/10/23 1327)                                    Medical Decision Making Cardiac monitor interpretation: Sinus rhythm, no ectopy  Patient here for ongoing nausea and vomiting for a week and a half.  CT scan fairly unremarkable except for some dilated CBD.  She also has some dilation but this is larger.  She is not having any abdominal pain or tenderness.  Alk phos is about the same as always.  Hyponatremic which is lower than her usual at 122.  She was given fluid resuscitation with improvement in her symptoms as well as her sodium on recheck.  I spoke with Dr. Cindie, GI, as CT recommended possible MRCP if patient has clinical signs of choledocholithiasis.  She does not have clinical signs of this and labs are fairly unchanged.  She was able to tolerate p.o. here without difficulty and is stable to be discharged home.  I spoke with her and offered her admission but she declined.  She will just  plan to follow-up and will return if anything gets worse.  She is on salt tabs at home.  I advised her to continue this and follow-up with primary care next week and otherwise return to the ER for new or worsening symptoms.  She feels comfortable being discharged.  Problems Addressed: Hyponatremia: chronic illness or injury with exacerbation, progression, or side effects of treatment Vomiting without nausea, unspecified vomiting type: acute illness or injury  Amount and/or Complexity of Data Reviewed External Data Reviewed: notes.    Details: Prior labs and outpatient records reviewed patient has a history of hyponatremia for which she takes salt tablets Labs: ordered. Decision-making details documented in ED Course.    Details: Ordered and reviewed by me patient was found to have hyponatremia at 122 and alk phos is about the same as usual.  No other acute abnormality is noted Radiology: ordered and independent interpretation performed. Decision-making details documented in ED Course.    Details: Ordered and reviewed by me  CT abdomen pelvis: Shows possible dilation of her CBD but no obvious stone.    Risk OTC drugs. Prescription drug management. Drug therapy requiring intensive monitoring for toxicity. Decision regarding hospitalization. Risk Details: Dr. Cindie - GI -I spoke with him regarding possible need for MRCP and we do that capability here although they cannot do it over the weekend.  Clinically I do not think the patient needs this at this time as she is not having any abdominal pain or alk phos symptoms and evaluation is no other lab abnormalities     Final diagnoses:  Hyponatremia  Vomiting without nausea, unspecified vomiting type    ED Discharge Orders          Ordered    ondansetron  (ZOFRAN ) 4 MG tablet  Every 8 hours PRN        12/10/23 1334               Lovely Kerins  L, DO 12/10/23 1410

## 2023-12-10 NOTE — ED Triage Notes (Signed)
 Patient come in POV from home, for vomiting unable to keep anything down, having pain to lower back and losing weight last weight was 90lbs.

## 2023-12-10 NOTE — Discharge Instructions (Signed)
 Use your Zofran  as needed for nausea and vomiting.  Call your primary care doctor Monday make a follow-up appointment.  Return to the ER for any new or worsening symptoms.

## 2023-12-12 ENCOUNTER — Other Ambulatory Visit: Payer: Self-pay

## 2023-12-12 ENCOUNTER — Other Ambulatory Visit: Payer: Self-pay | Admitting: Family Medicine

## 2023-12-12 ENCOUNTER — Telehealth: Payer: Self-pay | Admitting: Family Medicine

## 2023-12-12 DIAGNOSIS — R634 Abnormal weight loss: Secondary | ICD-10-CM

## 2023-12-12 DIAGNOSIS — R112 Nausea with vomiting, unspecified: Secondary | ICD-10-CM

## 2023-12-12 DIAGNOSIS — R748 Abnormal levels of other serum enzymes: Secondary | ICD-10-CM

## 2023-12-12 DIAGNOSIS — K838 Other specified diseases of biliary tract: Secondary | ICD-10-CM

## 2023-12-12 DIAGNOSIS — E559 Vitamin D deficiency, unspecified: Secondary | ICD-10-CM

## 2023-12-12 NOTE — Telephone Encounter (Signed)
 Lvm to cb

## 2023-12-12 NOTE — Telephone Encounter (Signed)
 Pls let pt know that I am aware she was seen in the   ED this weekend and needs a  follow-up appointment this Friday in the  office with me, pls add her to the schedule between 9:30 and 11 am She  should get stat labs drawn at the hospital in the morning prior to coming to the office so that I can follow-up with her sodium, pls order stat cmp and EGFR, also TSh and vit D levels , not stat dx   is n/v, low sodium, weight loss , low vit D to be done at te hospital this Friday ?? Pals ask Also let her know I am trying to get an appt with GI asap. Needs to bring all meds to visit, thanks!

## 2023-12-12 NOTE — Telephone Encounter (Signed)
 FYI, called patient had a message scheduled for Friday 11/14 @ 9:20 am

## 2023-12-14 ENCOUNTER — Encounter: Payer: Self-pay | Admitting: Gastroenterology

## 2023-12-16 ENCOUNTER — Other Ambulatory Visit (HOSPITAL_COMMUNITY)
Admission: RE | Admit: 2023-12-16 | Discharge: 2023-12-16 | Disposition: A | Discharge status: Home / Self Care | Source: Ambulatory Visit | Attending: Family Medicine | Admitting: Family Medicine

## 2023-12-16 ENCOUNTER — Ambulatory Visit (INDEPENDENT_AMBULATORY_CARE_PROVIDER_SITE_OTHER): Admitting: Family Medicine

## 2023-12-16 ENCOUNTER — Ambulatory Visit: Payer: Self-pay | Admitting: Family Medicine

## 2023-12-16 ENCOUNTER — Encounter: Payer: Self-pay | Admitting: Family Medicine

## 2023-12-16 VITALS — BP 130/72 | HR 64 | Resp 16 | Ht 62.0 in | Wt 99.0 lb

## 2023-12-16 DIAGNOSIS — I10 Essential (primary) hypertension: Secondary | ICD-10-CM | POA: Diagnosis not present

## 2023-12-16 DIAGNOSIS — F322 Major depressive disorder, single episode, severe without psychotic features: Secondary | ICD-10-CM

## 2023-12-16 DIAGNOSIS — R110 Nausea: Secondary | ICD-10-CM | POA: Diagnosis not present

## 2023-12-16 DIAGNOSIS — E871 Hypo-osmolality and hyponatremia: Secondary | ICD-10-CM

## 2023-12-16 DIAGNOSIS — Z09 Encounter for follow-up examination after completed treatment for conditions other than malignant neoplasm: Secondary | ICD-10-CM

## 2023-12-16 DIAGNOSIS — E559 Vitamin D deficiency, unspecified: Secondary | ICD-10-CM | POA: Insufficient documentation

## 2023-12-16 DIAGNOSIS — E441 Mild protein-calorie malnutrition: Secondary | ICD-10-CM

## 2023-12-16 DIAGNOSIS — R112 Nausea with vomiting, unspecified: Secondary | ICD-10-CM | POA: Diagnosis present

## 2023-12-16 DIAGNOSIS — G40309 Generalized idiopathic epilepsy and epileptic syndromes, not intractable, without status epilepticus: Secondary | ICD-10-CM

## 2023-12-16 LAB — COMPREHENSIVE METABOLIC PANEL WITH GFR
ALT: 24 U/L (ref 0–44)
AST: 33 U/L (ref 15–41)
Albumin: 4.6 g/dL (ref 3.5–5.0)
Alkaline Phosphatase: 197 U/L — ABNORMAL HIGH (ref 38–126)
Anion gap: 9 (ref 5–15)
BUN: 8 mg/dL (ref 8–23)
CO2: 29 mmol/L (ref 22–32)
Calcium: 9.9 mg/dL (ref 8.9–10.3)
Chloride: 94 mmol/L — ABNORMAL LOW (ref 98–111)
Creatinine, Ser: 0.39 mg/dL — ABNORMAL LOW (ref 0.44–1.00)
GFR, Estimated: >60 mL/min (ref >60)
Glucose, Bld: 87 mg/dL (ref 70–99)
Potassium: 5 mmol/L (ref 3.5–5.1)
Sodium: 131 mmol/L — ABNORMAL LOW (ref 135–145)
Total Bilirubin: 0.2 mg/dL (ref 0.0–1.2)
Total Protein: 8 g/dL (ref 6.5–8.1)

## 2023-12-16 LAB — VITAMIN D 25 HYDROXY (VIT D DEFICIENCY, FRACTURES): Vit D, 25-Hydroxy: 38.8 ng/mL (ref 30–100)

## 2023-12-16 LAB — TSH: TSH: 1.46 u[IU]/mL (ref 0.350–4.500)

## 2023-12-16 MED ORDER — MEGESTROL ACETATE 20 MG PO TABS: 20.0000 mg | ORAL_TABLET | Freq: Every day | ORAL | 3 refills | Status: AC

## 2023-12-16 MED ORDER — ONDANSETRON HCL 4 MG PO TABS: ORAL_TABLET | ORAL | 1 refills | Status: DC

## 2023-12-16 NOTE — Patient Instructions (Addendum)
   Please cancel 01/05/2024 and reschedule f/u end January please  New for appetite is megace  20 mg daily  Nausea med is prescribed 4 times weekly if needed  Labs today right after you leave here at the hospital  we will call with results by early afternoon  Please contact your insurance company to send paperwork for help with cleaning please speak with your case worker/ social worker for help with this, you dO need help and I understand that you are at risk of losing your housing if you are unable to get and keep home clean  Nurse pls give Ensure plus three cans daily, chocolate flavor  Thanks for choosing [facility], we consider it a privelige to serve you.

## 2023-12-19 ENCOUNTER — Encounter: Payer: Self-pay | Admitting: Family Medicine

## 2023-12-19 DIAGNOSIS — E441 Mild protein-calorie malnutrition: Secondary | ICD-10-CM | POA: Insufficient documentation

## 2023-12-19 NOTE — Assessment & Plan Note (Signed)
 Chronic , persistent , zofran  as needed, limit to 3 times / week

## 2023-12-19 NOTE — Assessment & Plan Note (Signed)
 Controlled, no change in medication

## 2023-12-19 NOTE — Progress Notes (Signed)
   Sandra Riley     MRN: [id number]      DOB: [birth year]  Chief Complaint  Patient presents with   Follow-up    ER follow up 11/08. Vomiting and hypoatremia    HPI Sandra Riley is here for follow up of ED visit on 11/08 for acute vomiting and low sodium Reports poor appetite , denies fever or chills, no other close contact has similar problem  Feels better than when she was in the ED Still reports occasional seizures ROS Denies recent fever or chills. Denies sinus pressure, nasal congestion, ear pain or sore throat. Denies chest congestion, productive cough or wheezing. Denies chest pains, palpitations and leg swelling .   Denies dysuria, frequency, hesitancy or incontinence. Denies joint pain, swelling and limitation in mobility. Denies headaches, seizures, numbness, or tingling. C/o   mild depression, anxiety or insomnia. Denies skin break down or rash.   PE  BP 130/72   Pulse 64   Resp 16   Wt 99 lb (44.9 kg)   SpO2 99%   BMI 18.11 kg/m   Patient alert and oriented and in no cardiopulmonary distress.  HEENT: No facial asymmetry, EOMI,     Neck supple .  Chest: Clear to auscultation bilaterally.  CVS: S1, S2 no murmurs, no S3.Regular rate.  ABD: Soft non tender.   Ext: No edema  MS: Adequate ROM spine, shoulders, hips and knees.  Skin: Intact, no ulcerations or rash noted.  Psych: Good eye contact, normal affect. Memory intact not anxious or depressed appearing.  CNS: CN 2-12 intact, power,  normal throughout.no focal deficits noted.   Assessment & Plan  Hyponatremia Improved and stable , lab repeated on day of visit  Nausea Chronic , persistent , zofran  as needed, limit to 3 times / week  Essential hypertension Controlled, no change in medication   Generalized convulsive epilepsy (HCC) Improved, stay on medication and f/u with neurology  Depression, major, single episode, severe (HCC) Controlled, no change in medication   Malnutrition  of mild degree Start megace  20 mg daily  Encounter for examination following treatment at hospital Patient in for follow up of recent ED visit Discharge summary, and laboratory and radiology data are reviewed, and any questions or concerns  are discussed. Specific issues requiring follow up are specifically addressed.

## 2023-12-19 NOTE — Assessment & Plan Note (Signed)
 Improved and stable , lab repeated on day of visit

## 2023-12-19 NOTE — Assessment & Plan Note (Signed)
Patient in for follow up of recent ED visit. Discharge summary, and laboratory and radiology data are reviewed, and any questions or concerns  are discussed. Specific issues requiring follow up are specifically addressed.  

## 2023-12-19 NOTE — Assessment & Plan Note (Signed)
 Start megace  20 mg daily

## 2023-12-19 NOTE — Assessment & Plan Note (Signed)
 Improved, stay on medication and f/u with neurology

## 2023-12-20 ENCOUNTER — Telehealth: Payer: Self-pay | Admitting: Family Medicine

## 2023-12-20 NOTE — Telephone Encounter (Signed)
 Spoke with patient to schedule her AWV.  She stated that she spoke with the nurse this past Friday about getting help with her apartment and has not heard back from anyone.   She would like to know the status .  Thank you,  Sandra Riley,  AMB Clinical Support [facility] AWV Program Direct Dial ??[phone]

## 2023-12-22 ENCOUNTER — Telehealth: Payer: Self-pay

## 2023-12-22 NOTE — Telephone Encounter (Signed)
 Copied from CRM #[id number]. Topic: General - Other >> Dec 22, 2023 10:49 AM Joesph NOVAK wrote: Reason for CRM: Patient spoke with a nurse about getting help with her apartment (house keeping) and would like for someone to fu with her in regards to this matter. >> Dec 22, 2023 10:52 AM Joesph B wrote: She stated a form needs to be filled out, spoke with a nurse and she said she will take care of it.

## 2023-12-23 NOTE — Telephone Encounter (Signed)
 Done

## 2023-12-27 ENCOUNTER — Other Ambulatory Visit: Payer: Self-pay

## 2023-12-27 ENCOUNTER — Telehealth: Payer: Self-pay

## 2023-12-27 DIAGNOSIS — R32 Unspecified urinary incontinence: Secondary | ICD-10-CM

## 2023-12-27 MED ORDER — UNABLE TO FIND: 5 refills | Status: DC

## 2023-12-27 NOTE — Telephone Encounter (Signed)
 Printed and faxed to [facility]

## 2023-12-27 NOTE — Telephone Encounter (Signed)
 Copied from CRM #[id number]. Topic: Clinical - Order For Equipment >> Dec 27, 2023  4:35 PM Tobias CROME wrote: Reason for CRM: Patient requesting prescription to be sent for diapers. Patient confirmed with insurance that they would be covered.   Requesting callback: [phone]

## 2023-12-29 ENCOUNTER — Other Ambulatory Visit: Payer: Self-pay | Admitting: Internal Medicine

## 2023-12-29 ENCOUNTER — Other Ambulatory Visit: Payer: Self-pay | Admitting: Family Medicine

## 2023-12-29 DIAGNOSIS — E876 Hypokalemia: Secondary | ICD-10-CM

## 2024-01-03 ENCOUNTER — Encounter (INDEPENDENT_AMBULATORY_CARE_PROVIDER_SITE_OTHER): Payer: Self-pay | Admitting: *Deleted

## 2024-01-04 ENCOUNTER — Other Ambulatory Visit: Payer: Self-pay | Admitting: Family Medicine

## 2024-01-05 ENCOUNTER — Ambulatory Visit: Admitting: Family Medicine

## 2024-01-05 ENCOUNTER — Ambulatory Visit

## 2024-01-09 ENCOUNTER — Telehealth: Payer: Self-pay | Admitting: Family Medicine

## 2024-01-09 NOTE — Telephone Encounter (Unsigned)
 Copied from CRM #[id number]. Topic: Clinical - Medication Refill >> Jan 09, 2024 10:19 AM Berwyn MATSU wrote: Medication: clonazePAM  (KLONOPIN ) 1 MG tablet   Has the patient contacted their pharmacy? Yes (Agent: If no, request that the patient contact the pharmacy for the refill. If patient does not wish to contact the pharmacy document the reason why and proceed with request.) (Agent: If yes, when and what did the pharmacy advise?)  This is the patient's preferred pharmacy:  [facility] - [place], [place] - [address] [place] [address] Phone: [phone] Fax: [phone]  Is this the correct pharmacy for this prescription? Yes If no, delete pharmacy and type the correct one.   Has the prescription been filled recently? Yes  Is the patient out of the medication? Yes  Has the patient been seen for an appointment in the last year OR does the patient have an upcoming appointment? Yes  Can we respond through MyChart? no  Agent: Please be advised that Rx refills may take up to 3 business days. We ask that you follow-up with your pharmacy.

## 2024-01-12 ENCOUNTER — Telehealth: Payer: Self-pay

## 2024-01-12 ENCOUNTER — Other Ambulatory Visit: Payer: Self-pay

## 2024-01-12 MED ORDER — CLONAZEPAM 1 MG PO TABS: 1.0000 mg | ORAL_TABLET | Freq: Two times a day (BID) | ORAL | 5 refills | Status: DC

## 2024-01-12 NOTE — Telephone Encounter (Signed)
Verbal given to pharmacy.

## 2024-01-12 NOTE — Telephone Encounter (Signed)
 Copied from CRM #[id number]. Topic: Clinical - Medication Question >> Jan 12, 2024  9:43 AM Delon DASEN wrote: Reason for CRM: clonazePAM  (KLONOPIN ) 1 MG tablet - patient still waiting for refill

## 2024-01-23 ENCOUNTER — Encounter: Payer: Self-pay | Admitting: Gastroenterology

## 2024-01-23 ENCOUNTER — Ambulatory Visit (INDEPENDENT_AMBULATORY_CARE_PROVIDER_SITE_OTHER): Admitting: Gastroenterology

## 2024-01-23 VITALS — BP 127/76 | HR 61 | Temp 98.6°F | Ht 62.0 in | Wt 100.4 lb

## 2024-01-23 DIAGNOSIS — R748 Abnormal levels of other serum enzymes: Secondary | ICD-10-CM | POA: Diagnosis not present

## 2024-01-23 DIAGNOSIS — K839 Disease of biliary tract, unspecified: Secondary | ICD-10-CM

## 2024-01-23 DIAGNOSIS — R112 Nausea with vomiting, unspecified: Secondary | ICD-10-CM | POA: Insufficient documentation

## 2024-01-23 DIAGNOSIS — R634 Abnormal weight loss: Secondary | ICD-10-CM | POA: Diagnosis not present

## 2024-01-23 DIAGNOSIS — E871 Hypo-osmolality and hyponatremia: Secondary | ICD-10-CM | POA: Diagnosis not present

## 2024-01-23 MED ORDER — PANTOPRAZOLE SODIUM 40 MG PO TBEC: 40.0000 mg | DELAYED_RELEASE_TABLET | Freq: Every day | ORAL | 0 refills | Status: AC

## 2024-01-23 NOTE — Progress Notes (Signed)
 "    GI Office Note    Referring Provider: Antonetta Rollene BRAVO, MD Primary Care Physician:  Antonetta Rollene BRAVO, MD  Primary Gastroenterologist: Ozell Hollingshead, MD   Chief Complaint   Chief Complaint  Patient presents with   Weight Loss    States that she has had some weight loss and nausea and vomiting.    History of Present Illness   Sandra Riley is a 63 y.o. female presenting today at the request of Dr. Antonetta for N/V, weight loss, elevated alk phos, dilated bile duct. Patient last seen 10/2022. Since we last saw her she required left vagal nerve stimulator (02/2023) due to medically refractory epilepsy and Keppra  dose decreased due to hyponatremia thought to be Keppra  induced.   She has history of chronically elevated alk phos and dilated bile duct, evaluated in 2024.   Patient hospitalized last month.  She has chronic hyponatremia. Has been intermittent over past couple of years but persistent since July 2025. Sodium as low as 122.   Discussed the use of AI scribe software for clinical note transcription with the patient, who gave verbal consent to proceed.  History of Present Illness   Since her last visit in 2024, she has had multiple hospitalizations. She has had issues with hyponatremia, seizures not maintained by medication requiring vagal nerve stimulator in 02/2023. She denies having any GI symptoms after her VNS. She was hospitalized in 12/2023. She has had significant N/V around that time. She does not recall when symptoms first started. Overall she lost from 125 pounds (06/2023) to as low as 90 pounds. She has been able to gain back to 100 pounds and stable for several weeks. During her work up, imaging has shown progressive bile duct dilation since 2024. An MRI in 2024 showed no obstruction with stones or mass.   Currently she tolerates only small amounts of food at a time. She is drinking two Ensure per day. Megestrol  was started after a November 2025  hospitalization as an appetite stimulant. Uses zofran  when needed but does not find helpful.   She denies heartburn, esophageal burning, dysphagia, abdominal pain after eating, and blood or black stools. At times she stops eating when she feels food is not going down and anticipates vomiting, which she relates to increased phlegm and mucus in her stomach. Bowel movements are reported as normal.  She has new persistent hiccups that interfere with sleep and require her to sleep in a recliner.    She avoids aspirin powders or NSAIDs, advised years ago to not use due to concern for ulcers. She takes Tylenol  for pain.      Wt Readings from Last 20 Encounters:  01/23/24 100 lb 6.4 oz (45.5 kg)  12/16/23 99 lb (44.9 kg)  12/10/23 90 lb (40.8 kg)  11/08/23 98 lb 8 oz (44.7 kg)  10/25/23 100 lb 12.8 oz (45.7 kg)  09/22/23 102 lb (46.3 kg)  09/01/23 103 lb 0.6 oz (46.7 kg)  08/30/23 106 lb 8 oz (48.3 kg)  08/17/23 107 lb 0.6 oz (48.6 kg)  08/12/23 103 lb 2.8 oz (46.8 kg)  08/11/23 106 lb (48.1 kg)  07/26/23 112 lb 12.8 oz (51.2 kg)  07/14/23 114 lb 6.4 oz (51.9 kg)  06/30/23 110 lb 1.9 oz (50 kg)  06/22/23 117 lb 12.8 oz (53.4 kg)  06/06/23 121 lb (54.9 kg)  04/22/23 120 lb 1.9 oz (54.5 kg)  03/08/23 121 lb 14.6 oz (55.3 kg)  02/17/23 122 lb (55.3 kg)  02/11/23 121 lb (54.9 kg)     Prior Data     Results      Latest Ref Rng & Units 12/16/2023   10:26 AM 12/10/2023   10:11 AM 11/08/2023    4:03 PM  Hepatic Function  Total Protein 6.5 - 8.1 g/dL 8.0  7.2  7.5   Albumin 3.5 - 5.0 g/dL 4.6  4.1  4.1   AST 15 - 41 U/L 33  34  26   ALT 0 - 44 U/L 24  22  16    Alk Phosphatase 38 - 126 U/L 197  189  205   Total Bilirubin 0.0 - 1.2 mg/dL 0.2  0.4  0.2       Latest Ref Rng & Units 12/16/2023   10:26 AM 12/10/2023   12:37 PM 12/10/2023   10:11 AM  BMP  Glucose 70 - 99 mg/dL 87  92  99   BUN 8 - 23 mg/dL 8  <5  <5   Creatinine 0.44 - 1.00 mg/dL 9.60  9.64  9.63   Sodium 135 -  145 mmol/L 131  132  122   Potassium 3.5 - 5.1 mmol/L 5.0  4.5  4.0   Chloride 98 - 111 mmol/L 94  95  88   CO2 22 - 32 mmol/L 29  30  25    Calcium 8.9 - 10.3 mg/dL 9.9  9.4  9.2       Latest Ref Rng & Units 12/10/2023   10:11 AM 11/08/2023    4:03 PM 08/12/2023    4:40 AM  CBC  WBC 4.0 - 10.5 K/uL 5.6  7.1  6.0   Hemoglobin 12.0 - 15.0 g/dL 87.4  86.5  86.9   Hematocrit 36.0 - 46.0 % 35.2  38.0  39.1   Platelets 150 - 400 K/uL 431  564  369    TSH 1.460 12/2023 Folate 3.05 August 2023 B12 460 10 August 2023  November 2024: LFTs normal except for alk phos 179, GGT 320, TTG IgA less than 2, IgG 1680, IgA 235, IgM 96, smooth muscle antibody IgG 13 (negative), iron 55, TIBC 342, iron sat 16, ferritin 46, hep B surface antigen nonreactive  September 2024: Mitochondrial antibody less than 20, GGT 379, alk phos isoenzymes with liver fraction 77, bone fraction 23, alk phos 210  HCV Ab neg 2017  RUQ U/S: with hypoechoic material in CBD. CBD 9.48mm. Fatty liver. Consider MRCP.  MRCP with and without contrast September 2024: IMPRESSION: 1. Status post cholecystectomy. 2. Mild dilatation of the common bile duct measuring up to 0.8 cm in caliber, duct tapering smoothly to the ampulla. No biliary ductal filling defect on today's examination. Findings are most consistent with benign postoperative biliary ductal dilatation. 3. Cardiomegaly.  CT abdomen pelvis with contrast December 10, 2023: IMPRESSION: 1. Progressive fusiform dilatation of the common bile duct measuring up to 1.2 cm, previously 0.8 cm, without calcified choledocholithiasis identified. 2. In the setting of clinical signs and symptoms of biliary obstruction, consider further evaluation with MRCP. In the absence of clinical signs of biliary obstruction, this is favored to represent post-. cholecystectomy physiology. 3. Progressive moderate intrahepatic bile duct dilatation.  Colonoscopy 02/2019: -diverticulosis -one 5mm polyp at  hepatic flexure removed -next colonoscopy five years.   Medications   Current Outpatient Medications  Medication Sig Dispense Refill   acetaminophen  (TYLENOL ) 500 MG tablet Take 1,000 mg by mouth every 6 (six) hours as needed for moderate pain.  alendronate  (FOSAMAX ) 35 MG tablet TAKE 1 TABLET BY MOUTH ONCE WEEKLY IN THE MORNING with full GLASS of water  ON an EMPTY stomach DO not lay down FOR 30 mins 5 tablet 5   amLODipine  (NORVASC ) 2.5 MG tablet TAKE 1 TABLET BY MOUTH DAILY 90 tablet 3   azelastine (ASTELIN) 0.1 % nasal spray Place 2 sprays into both nostrils 2 (two) times daily.     Calcium Carbonate (CALCIUM 500 PO) Take 1 tablet by mouth daily.     Camphor-Eucalyptus-Menthol  (VICKS VAPORUB EX) Apply 1 Application topically as needed (congestion.).     cetirizine  (ZYRTEC ) 10 MG tablet Take 1 tablet (10 mg total) by mouth daily. 30 tablet 11   clonazePAM  (KLONOPIN ) 1 MG tablet Take 1 tablet (1 mg total) by mouth 2 (two) times daily. 60 tablet 5   fluticasone  (FLONASE ) 50 MCG/ACT nasal spray Place 2 sprays into both nostrils daily. 16 g 6   Humidifier MISC Large humidifier. 1 each 0   levETIRAcetam  (KEPPRA ) 1000 MG tablet Take 2,000 mg by mouth 2 (two) times daily.     megestrol  (MEGACE ) 20 MG tablet Take 1 tablet (20 mg total) by mouth daily. 30 tablet 3   metoprolol  tartrate (LOPRESSOR ) 25 MG tablet TAKE 1 TABLET BY MOUTH TWICE DAILY 180 tablet 3   mirtazapine  (REMERON ) 45 MG tablet Take 1 tablet (45 mg total) by mouth at bedtime. 30 tablet 5   Multiple Vitamin (MULTIVITAMIN) tablet Take 1 tablet by mouth in the morning.     ondansetron  (ZOFRAN ) 4 MG tablet TAKE 1 TABLET BY MOUTH 3 TO 4 TIMES WEEKLY AS NEEDED FOR NAUSEA 16 tablet 1   PHENObarbital  (LUMINAL) 100 MG tablet TAKE 2 TABLETS BY MOUTH AT BEDTIME 180 tablet 3   potassium chloride  (KLOR-CON  M) 10 MEQ tablet TAKE 1 TABLET BY MOUTH TWICE DAILY 60 tablet 1   sodium chloride  1 g tablet Take 1 tablet (1 g total) by mouth 2 (two)  times daily with a meal. 60 tablet 3   UNABLE TO FIND Med Name: Adult Diapers Size Small 4 Box 5   No current facility-administered medications for this visit.    Allergies   Allergies as of 01/23/2024 - Review Complete 01/23/2024  Allergen Reaction Noted   Aspirin Other (See Comments) 02/21/2007   Nsaids Other (See Comments) 01/26/2022   Singulair  [montelukast  sodium] Nausea Only and Other (See Comments) 03/30/2016     Past Medical History   Past Medical History:  Diagnosis Date   Abnormal facial hair 05/11/2011   Anxiety and depression    GAD (generalized anxiety disorder)    Headache    Heart murmur    ECHO 06/15/22: SAM of anterior MV leaflet with dynamic LVOT gradient in setting of moderate symmetrical LVH and hyperdynamic LVF, Peak resting gradient mild at 24 mmHg   Hypertension    Insomnia    Psychotic disorder (HCC)    Seizure disorder (HCC)    stress related seizures. 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[facility])     Past Surgical History   Past Surgical History:  Procedure Laterality Date   ABDOMINAL HYSTERECTOMY     BREAST BIOPSY Left 08/24/2022   US  LT BREAST BX W LOC DEV 1ST LESION IMG BX SPEC US  GUIDE 08/24/2022 [facility]-ULTRASOUND   CESAREAN SECTION     CHOLECYSTECTOMY     COLONOSCOPY WITH PROPOFOL  N/A 02/15/2019   Procedure: COLONOSCOPY WITH PROPOFOL ;  Surgeon: Shaaron Lamar HERO, MD;  Location: [facility]  ENDO SUITE;  Service: Endoscopy;  Laterality: N/A;  2:45pm   POLYPECTOMY  02/15/2019   Procedure: POLYPECTOMY;  Surgeon: Shaaron Lamar HERO, MD;  Location: [facility] ENDO SUITE;  Service: Endoscopy;;   TOTAL HIP ARTHROPLASTY Left 01/27/2022   Procedure: TOTAL HIP ARTHROPLASTY ANTERIOR APPROACH;  Surgeon: Jerri Kay HERO, MD;  Location: [facility] OR;  Service: Orthopedics;  Laterality: Left;   VAGUS NERVE STIMULATOR INSERTION Left 02/17/2023   Procedure: VAGAL NERVE STIMULATOR IMPLANT;  Surgeon: Lanis Pupa, MD;  Location: [facility] OR;  Service: Neurosurgery;  Laterality:  Left;  3C    Past Family History   Family History  Problem Relation Age of Onset   Breast cancer Mother 75   Coronary artery disease Father    Stroke Father 30   Breast cancer Sister 28   Breast cancer Sister    Breast cancer Sister    Diabetes Brother    Heart attack Brother    Colon cancer Neg Hx     Past Social History   Social History   Socioeconomic History   Marital status: Married    Spouse name: Jerel   Number of children: 2   Years of education: 12th   Highest education level: 12th grade  Occupational History   Occupation: disabled  Tobacco Use   Smoking status: Never   Smokeless tobacco: Never  Vaping Use   Vaping status: Never Used  Substance and Sexual Activity   Alcohol use: No   Drug use: No   Sexual activity: Yes    Birth control/protection: Surgical  Other Topics Concern   Not on file  Social History Narrative   Right handed   Caffeine-1 cup daily   Lives with husband      Oldest son passed away in 02-10-22   Social Drivers of Health   Tobacco Use: Low Risk (01/23/2024)   Patient History    Smoking Tobacco Use: Never    Smokeless Tobacco Use: Never    Passive Exposure: Not on file  Financial Resource Strain: Low Risk (07/08/2022)   Overall Financial Resource Strain (CARDIA)    Difficulty of Paying Living Expenses: Not hard at all  Food Insecurity: No Food Insecurity (08/11/2023)   Epic    Worried About Radiation Protection Practitioner of Food in the Last Year: Never true    Ran Out of Food in the Last Year: Never true  Transportation Needs: No Transportation Needs (08/11/2023)   Epic    Lack of Transportation (Medical): No    Lack of Transportation (Non-Medical): No  Physical Activity: Inactive (07/08/2022)   Exercise Vital Sign    Days of Exercise per Week: 0 days    Minutes of Exercise per Session: 0 min  Stress: No Stress Concern Present (07/08/2022)   [employer] of Occupational Health - Occupational Stress Questionnaire    Feeling of Stress : Not at  all  Social Connections: Socially Integrated (08/11/2023)   Social Connection and Isolation Panel    Frequency of Communication with Friends and Family: Three times a week    Frequency of Social Gatherings with Friends and Family: Once a week    Attends Religious Services: More than 4 times per year    Active Member of [employer] or Organizations: Yes    Attends Banker Meetings: More than 4 times per year    Marital Status: Married  Catering Manager Violence: Not At Risk (08/11/2023)   Epic    Fear of Current or Ex-Partner: No    Emotionally Abused:  No    Physically Abused: No    Sexually Abused: No  Depression (PHQ2-9): Low Risk (12/16/2023)   Depression (PHQ2-9)    PHQ-2 Score: 4  Alcohol Screen: Low Risk (07/08/2022)   Alcohol Screen    Last Alcohol Screening Score (AUDIT): 0  Housing: Low Risk (08/11/2023)   Epic    Unable to Pay for Housing in the Last Year: No    Number of Times Moved in the Last Year: 0    Homeless in the Last Year: No  Utilities: Not At Risk (08/11/2023)   Epic    Threatened with loss of utilities: No  Health Literacy: Not on file    Review of Systems   General: Negative for  fever, chills, fatigue, weakness.see hpi ENT: Negative for hoarseness, difficulty swallowing , nasal congestion. CV: Negative for chest pain, angina, palpitations, dyspnea on exertion, peripheral edema.  Respiratory: Negative for dyspnea at rest, dyspnea on exertion, cough, sputum, wheezing.  GI: See history of present illness. GU:  Negative for dysuria, hematuria, urinary incontinence, urinary frequency, nocturnal urination.  Endo: Negative for unusual weight change.     Physical Exam   BP 127/76   Pulse 61   Temp 98.6 F (37 C) (Oral)   Ht 5' 2 (1.575 m)   Wt 100 lb 6.4 oz (45.5 kg)   SpO2 97%   BMI 18.36 kg/m    General: thin,  well-developed in no acute distress.  Eyes: No icterus. Mouth: Oropharyngeal mucosa moist and pink   Lungs: Clear to auscultation  bilaterally.  Heart: Regular rate and rhythm, no murmurs rubs or gallops.  Abdomen: Bowel sounds are normal, nontender, nondistended, no hepatosplenomegaly or masses,  no abdominal bruits or hernia , no rebound or guarding.  Rectal: not performed Extremities: No lower extremity edema. No clubbing or deformities. Neuro: Alert and oriented x 4   Skin: Warm and dry, no jaundice.   Psych: Alert and cooperative, normal mood and affect.  Labs   See above Imaging Studies   No results found.  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 She had dilated CBD of 8mm on MRCP 10/2022 with no signs of mass/stones/obstruction. Now 12mm on CT. She is status post cholecystectomy. No postprandial abdominal pain. She has had N/v as outlined. Alk phos chronically elevated but other LFTs normal. - Ordered comprehensive blood work, PBC profile, iron/tibc/ferritin, cbc.  -Deferred repeat MRI until blood work results are available; will consider MRI or upper endoscopy based on findings.   Unintentional weight loss with nausea and vomiting Subacute unintentional weight loss with prior severe malnutrition, ongoing risk due to intermittent nausea and poor oral intake. Managed with nutritional supplements and appetite stimulant. - Reviewed nutritional strategies including Ensure, and small frequent meals. - Confirmed ongoing use of megestrol  as appetite stimulant. - Will consider upper endoscopy if blood work unrevealing or suggests gastric pathology. - Provided guidance on constipation risk with high-protein supplements.  Hyponatremia Chronic hyponatremia, likely multifactorial including possible medication effect but cannot rule out adrenal insufficiency, requires further diagnostic evaluation. - Ordered fasting morning cortisol level to assess adrenal function.   Hiccups    New onset persistent singultus, etiology unclear, possibly related to gastrointestinal or metabolic disturbances, interfering with sleep. - Pantoprazole  40mg  daily  for acid supression therapy to assess benefit in reducing hiccups. - Will reassess symptoms after therapy initiation and review of blood work.  Sonny RAMAN. Ezzard, MHS, PA-C [facility]  "

## 2024-01-23 NOTE — Patient Instructions (Signed)
 Please complete your labs, you have to fast (nothing to eat or drink after midnight) and go to lab between 8-10am. You can complete next week if you wish.  Once results are back, we will decide next step. You may need repeat MRI of your liver and/or an upper endoscopy.  Start pantoprazole  40mg  daily before breakfast to see if helps your hiccups and nausea. RX sent to [facility].

## 2024-01-24 ENCOUNTER — Encounter: Payer: Self-pay | Admitting: Neurology

## 2024-01-24 ENCOUNTER — Ambulatory Visit: Admitting: Neurology

## 2024-01-24 VITALS — BP 138/85 | HR 84 | Ht 62.0 in | Wt 101.5 lb

## 2024-01-24 DIAGNOSIS — Z9689 Presence of other specified functional implants: Secondary | ICD-10-CM | POA: Diagnosis not present

## 2024-01-24 DIAGNOSIS — R221 Localized swelling, mass and lump, neck: Secondary | ICD-10-CM | POA: Diagnosis not present

## 2024-01-24 DIAGNOSIS — G40219 Localization-related (focal) (partial) symptomatic epilepsy and epileptic syndromes with complex partial seizures, intractable, without status epilepticus: Secondary | ICD-10-CM | POA: Diagnosis not present

## 2024-01-24 DIAGNOSIS — E871 Hypo-osmolality and hyponatremia: Secondary | ICD-10-CM

## 2024-01-24 NOTE — Addendum Note (Signed)
 Addended byBETHA GREGG LEK on: 01/24/2024 03:53 PM   Modules accepted: Orders

## 2024-01-24 NOTE — Progress Notes (Signed)
 "  [facility]  PATIENT: Sandra Riley DOB: [birth year]  REQUESTING CLINICIAN: Antonetta Rollene BRAVO, MD HISTORY FROM: Patient, husband and chart review  REASON FOR VISIT: Seizure disorder follow up    HISTORICAL  CHIEF COMPLAINT:  Chief Complaint  Patient presents with   Follow-up    Room 13 With husband Therapeutic drug monitoring, Localization-related symptomatic epilepsy and epileptic syndromes with complex partial seizures, intractable, without status epilepticus,S/P placement of VNS (vagus nerve stimulation) device    INTERVAL HISTORY 01/24/2024 Patient presents today for follow-up, she is accompanied by her husband.  Since last visit in October, she tells me that she has on average 2 seizures per month.  She is compliant with her medications.  She described her seizures are as word-finding difficulty, aphasia followed by tiredness and sleepiness.  Her most recent EEG showed left temporal epileptiform discharges.  She tells me that she still experiences occasional pain with the VNS.  On top of that, she does also report a small mass  left side of her neck.   INTERVAL HISTORY 11/08/2023 Patient presents today for follow-up, she is accompanied by her husband.  Last visit was in July, since then she tells me she has been doing okay, continues to have seizures, last 1 was 4 days ago.  She also reports nocturnal seizure that she described as waking up feeling tired, waking up with a headache and feels weak for the entire day. The husband did not report generalized convulsion during sleep.   She is compliant with her medications including phenobarbital  200 mg at night and Keppra  2000 mg twice daily.  Her last sodium was low at 125, I did prescribe her salt tablet 1 g to take twice daily but she did not pick up the medication.  She tells me that she is using some over-the-counter salt tablets. She continues to Swipe her VNS daily, still reports cough.   She also  report this past week she has been dealing with diarrhea, nausea vomiting, she went to urgent care but did not see her PCP.  She tells me during urgent care visit she did not have any blood work done   INTERVAL HISTORY 08/30/2023 Sandra Riley presents today for follow-up, she is accompanied by her husband.  Last visit was in May, at that time we continued her medications, and increased her VNS settings.  Since then, she told me she was was admitted to the hospital for hyponatremia.  However, she was told hyponatremia likely related to levetiracetam  and levetiracetam  was decreased from 2000 mg twice daily to 1000 mg twice daily.  Since the decrease of the levetiracetam , she has been having more headaches, and that she has been having more seizures.  She reports nocturnal seizures because she will wake up in the morning nauseated, feeling off balance and having bad headaches.  These were similar symptoms that she used to have following a seizure.  She also reported on 1 occasion she was unresponsive when husband was talking to her.  She is experiencing more headaches.  She would like to go back to her previous dose of Keppra .  She has been following up with her PCP, had subsequent sodium level checked and still showing hyponatremia.  She tells me that she drinks 2 Gatorade a day but reports poor appetite, not eating well. When it comes to the VNS, reports pain with magnet stimulation   INTERVAL HISTORY 06/06/2023:  Patient presents today for follow up. Last visit was in March, at  that time, we continue the VNS titration, tolerated the procedure well. She tells me that she has 2 light seizures, last one a last week. Her main complaint at this time is throat pain due to seasonal allergies. She confirms with me that it is not related to her VNS.    INTERVAL HISTORY 04/19/2023:  Patient presents today for follow-up, last visit was in February 4.  Since then she has been doing well, she tells me that she had 2  light seizures, on February 15 and the last 1 on March 3.  With the seizures she feels lightheaded, dizzy and sometimes there is confusion.  She is tolerating the VNS settings well, also on swipe at least once a day.  No other complaints, no other concerns.   INTERVAL HISTORY 03/08/2023:  Patient presents today for follow up. She is accompanied by her husband. Last visit was in OctobeR, at that time we have increase her Phenobarbital  to 200 mg nightly and Keppra  to 2000 mg twice daily. She continued to have breakthrough seizures. She had her VNS implanted on January 16, reports one seizure since implantation of VNS, possibly due to missed medication.   INTERVAL HISTORY 11/18/2022 Patient presents today for follow-up, last visit was in January, since then, she tells me that she had her hip surgery but continues to have breakthrough seizures.  She reports at least 2 seizures during her sleep.  Husband is not aware of the seizures but patient tells me that when she wakes up in the morning, she feels tired, feels drowsy, weak and has a bad taste in her mouth and that is how she knows she had a seizure.   Husband tells me about once a month she will have episode of behavioral arrest, unresponsiveness, looking like she is in a trance.  She is on phenobarbital  200 mg nightly Monday to Friday and 100 mg nightly Saturday and Sunday and Keppra  1500 mg twice daily but still having breakthrough seizure. Her routine EEG showed left temporal epileptiform discharges.    HISTORY OF PRESENT ILLNESS:  This is a 63 year old woman past medical history of seizure disorder, anxiety, depression, osteoporosis, recent fall with hip fracture who is presenting to establish care.  Patient reports a long history of seizure diagnosed at the age of 37.  Initially she describes her seizures as generalized convulsion with tongue biting.  He was on phenobarbital  and Dilantin .  She reported her seizure was uncontrolled until she initially  follow-up with Dr. Milton who added levetiracetam .  Since being on levetiracetam  and phenobarbital  she has not had any generalized convulsion and this is more than 20 years ago.  Now she is reporting seizure that she described as disorientation and losing track of time and confusion.  She still having those and they have been worse in the past year since her son died\.  Currently she is on phenobarbital  97.2 mg 2 tabs nightly and Keppra  1500 mg twice daily.  While in rehab, she was told that her phenobarbital  level was elevated therefore it was decreased to 1 tablet on Saturday and Sunday.  She denies any symptoms  associated with phenobarb toxicity and she reports that her last seizure that she described as disorientation and losing track of time was in December 2023 since then she has not had any additional seizures.  She is tolerating the medications very well and denies any side effect. She denies any seizure risk factors, denies any family history of seizures denies any injury from seizures.  Her fall and hip fracture was not related to a seizure.   Handedness: Right handed   Onset: At the age of 37  Seizure Type: Disorientation, losing track of time, last convulsion more than 20 year ago   Current frequency: Last seiure was in Dec 2023  Any injuries from seizures: Tongue biting   Seizure risk factors: Denies   Previous ASMs: Phenobarbital , Levetiracetam , Dilantin ,   Currenty ASMs: Phenobarbital  200 mg nightly, and Levetiracetam  2000 mg twice daily  ASMs side effects: None   Brain Images: None available for review   Previous EEGs: Left temporal epileptiform discharges   OTHER MEDICAL CONDITIONS: Seizure disorder, recent fall and hip fracture, Hypertension, osteoporosis, anxiety/depression   REVIEW OF SYSTEMS: Full 14 system review of systems performed and negative with exception of: As noted in the HPI   ALLERGIES: Allergies  Allergen Reactions   Aspirin Other (See Comments)     Allergic to non coated ASA Caused gastric ulcers    Nsaids Other (See Comments)    Directed by MD to not take.    Singulair  [Montelukast  Sodium] Nausea Only and Other (See Comments)    Became sweaty    HOME MEDICATIONS: Outpatient Medications Prior to Visit  Medication Sig Dispense Refill   acetaminophen  (TYLENOL ) 500 MG tablet Take 1,000 mg by mouth every 6 (six) hours as needed for moderate pain.     alendronate  (FOSAMAX ) 35 MG tablet TAKE 1 TABLET BY MOUTH ONCE WEEKLY IN THE MORNING with full GLASS of water  ON an EMPTY stomach DO not lay down FOR 30 mins 5 tablet 5   amLODipine  (NORVASC ) 2.5 MG tablet TAKE 1 TABLET BY MOUTH DAILY 90 tablet 3   azelastine (ASTELIN) 0.1 % nasal spray Place 2 sprays into both nostrils 2 (two) times daily.     Calcium Carbonate (CALCIUM 500 PO) Take 1 tablet by mouth daily.     Camphor-Eucalyptus-Menthol  (VICKS VAPORUB EX) Apply 1 Application topically as needed (congestion.).     cetirizine  (ZYRTEC ) 10 MG tablet Take 1 tablet (10 mg total) by mouth daily. 30 tablet 11   clonazePAM  (KLONOPIN ) 1 MG tablet Take 1 tablet (1 mg total) by mouth 2 (two) times daily. 60 tablet 5   fluticasone  (FLONASE ) 50 MCG/ACT nasal spray Place 2 sprays into both nostrils daily. 16 g 6   Humidifier MISC Large humidifier. 1 each 0   levETIRAcetam  (KEPPRA ) 1000 MG tablet Take 2,000 mg by mouth 2 (two) times daily.     megestrol  (MEGACE ) 20 MG tablet Take 1 tablet (20 mg total) by mouth daily. 30 tablet 3   metoprolol  tartrate (LOPRESSOR ) 25 MG tablet TAKE 1 TABLET BY MOUTH TWICE DAILY 180 tablet 3   mirtazapine  (REMERON ) 45 MG tablet Take 1 tablet (45 mg total) by mouth at bedtime. 30 tablet 5   Multiple Vitamin (MULTIVITAMIN) tablet Take 1 tablet by mouth in the morning.     ondansetron  (ZOFRAN ) 4 MG tablet TAKE 1 TABLET BY MOUTH 3 TO 4 TIMES WEEKLY AS NEEDED FOR NAUSEA 16 tablet 1   pantoprazole  (PROTONIX ) 40 MG tablet Take 1 tablet (40 mg total) by mouth daily before  breakfast. 90 tablet 0   PHENObarbital  (LUMINAL) 100 MG tablet TAKE 2 TABLETS BY MOUTH AT BEDTIME 180 tablet 3   potassium chloride  (KLOR-CON  M) 10 MEQ tablet TAKE 1 TABLET BY MOUTH TWICE DAILY 60 tablet 1   sodium chloride  1 g tablet Take 1 tablet (1 g total) by mouth 2 (two) times daily with  a meal. 60 tablet 3   UNABLE TO FIND Med Name: Adult Diapers Size Small 4 Box 5   No facility-administered medications prior to visit.    PAST MEDICAL HISTORY: Past Medical History:  Diagnosis Date   Abnormal facial hair 05/11/2011   Anxiety and depression    GAD (generalized anxiety disorder)    Headache    Heart murmur    ECHO 06/15/22: SAM of anterior MV leaflet with dynamic LVOT gradient in setting of moderate symmetrical LVH and hyperdynamic LVF, Peak resting gradient mild at 24 mmHg   Hypertension    Insomnia    Psychotic disorder (HCC)    Seizure disorder (HCC)    stress related seizures. 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[facility])     PAST SURGICAL HISTORY: Past Surgical History:  Procedure Laterality Date   ABDOMINAL HYSTERECTOMY     BREAST BIOPSY Left 08/24/2022   US  LT BREAST BX W LOC DEV 1ST LESION IMG BX SPEC US  GUIDE 08/24/2022 [facility]-ULTRASOUND   CESAREAN SECTION     CHOLECYSTECTOMY     COLONOSCOPY WITH PROPOFOL  N/A 02/15/2019   Procedure: COLONOSCOPY WITH PROPOFOL ;  Surgeon: Shaaron Lamar HERO, MD;  Location: [facility];  Service: Endoscopy;  Laterality: N/A;  2:45pm   POLYPECTOMY  02/15/2019   Procedure: POLYPECTOMY;  Surgeon: Shaaron Lamar HERO, MD;  Location: [facility];  Service: Endoscopy;;   TOTAL HIP ARTHROPLASTY Left 01/27/2022   Procedure: TOTAL HIP ARTHROPLASTY ANTERIOR APPROACH;  Surgeon: Jerri Kay HERO, MD;  Location: [facility] OR;  Service: Orthopedics;  Laterality: Left;   VAGUS NERVE STIMULATOR INSERTION Left 02/17/2023   Procedure: VAGAL NERVE STIMULATOR IMPLANT;  Surgeon: Lanis Pupa, MD;  Location: [facility] OR;  Service: Neurosurgery;  Laterality:  Left;  3C    FAMILY HISTORY: Family History  Problem Relation Age of Onset   Breast cancer Mother 107   Coronary artery disease Father    Stroke Father 40   Breast cancer Sister 54   Breast cancer Sister    Breast cancer Sister    Diabetes Brother    Heart attack Brother    Colon cancer Neg Hx     SOCIAL HISTORY: Social History   Socioeconomic History   Marital status: Married    Spouse name: Jerel   Number of children: 2   Years of education: 12th   Highest education level: 12th grade  Occupational History   Occupation: disabled  Tobacco Use   Smoking status: Never   Smokeless tobacco: Never  Vaping Use   Vaping status: Never Used  Substance and Sexual Activity   Alcohol use: No   Drug use: No   Sexual activity: Yes    Birth control/protection: Surgical  Other Topics Concern   Not on file  Social History Narrative   Right handed   Caffeine-1 cup daily   Lives with husband      Oldest son passed away in February 17, 2022   Social Drivers of Health   Tobacco Use: Low Risk (01/24/2024)   Patient History    Smoking Tobacco Use: Never    Smokeless Tobacco Use: Never    Passive Exposure: Not on file  Financial Resource Strain: Low Risk (07/08/2022)   Overall Financial Resource Strain (CARDIA)    Difficulty of Paying Living Expenses: Not hard at all  Food Insecurity: No Food Insecurity (08/11/2023)   Epic    Worried About Radiation Protection Practitioner of Food in the Last Year: Never true  Ran Out of Food in the Last Year: Never true  Transportation Needs: No Transportation Needs (08/11/2023)   Epic    Lack of Transportation (Medical): No    Lack of Transportation (Non-Medical): No  Physical Activity: Inactive (07/08/2022)   Exercise Vital Sign    Days of Exercise per Week: 0 days    Minutes of Exercise per Session: 0 min  Stress: No Stress Concern Present (07/08/2022)   [employer] of Occupational Health - Occupational Stress Questionnaire    Feeling of Stress : Not at all  Social  Connections: Socially Integrated (08/11/2023)   Social Connection and Isolation Panel    Frequency of Communication with Friends and Family: Three times a week    Frequency of Social Gatherings with Friends and Family: Once a week    Attends Religious Services: More than 4 times per year    Active Member of Clubs or Organizations: Yes    Attends Banker Meetings: More than 4 times per year    Marital Status: Married  Catering Manager Violence: Not At Risk (08/11/2023)   Epic    Fear of Current or Ex-Partner: No    Emotionally Abused: No    Physically Abused: No    Sexually Abused: No  Depression (PHQ2-9): Low Risk (12/16/2023)   Depression (PHQ2-9)    PHQ-2 Score: 4  Alcohol Screen: Low Risk (07/08/2022)   Alcohol Screen    Last Alcohol Screening Score (AUDIT): 0  Housing: Low Risk (08/11/2023)   Epic    Unable to Pay for Housing in the Last Year: No    Number of Times Moved in the Last Year: 0    Homeless in the Last Year: No  Utilities: Not At Risk (08/11/2023)   Epic    Threatened with loss of utilities: No  Health Literacy: Not on file    PHYSICAL EXAM  GENERAL EXAM/CONSTITUTIONAL: Vitals:  Vitals:   01/24/24 1515  BP: 138/85  Pulse: 84  SpO2: 93%  Weight: 101 lb 8 oz (46 kg)  Height: 5' 2 (1.575 m)   Body mass index is 18.56 kg/m. Wt Readings from Last 3 Encounters:  01/24/24 101 lb 8 oz (46 kg)  01/23/24 100 lb 6.4 oz (45.5 kg)  12/16/23 99 lb (44.9 kg)   Patient is in no distress; well developed, nourished and groomed; neck is supple  MUSCULOSKELETAL: Gait, strength, tone, movements noted in Neurologic exam below  NEUROLOGIC: MENTAL STATUS:     07/08/2022   11:02 AM  MMSE - Mini Mental State Exam  Not completed: Unable to complete   awake, alert, oriented to person, place and time recent and remote memory intact normal attention and concentration language fluent, comprehension intact, naming intact fund of knowledge  appropriate  CRANIAL NERVE:  2nd, 3rd, 4th, 6th - Visual fields full to confrontation, extraocular muscles intact, no nystagmus 5th - facial sensation symmetric 7th - facial strength symmetric 8th - hearing intact 9th - palate elevates symmetrically, uvula midline 11th - shoulder shrug symmetric 12th - tongue protrusion midline  MOTOR:  normal bulk and tone, at least antigravity in the BUE and BLEs  SENSORY:  normal and symmetric to light touch  GAIT/STATION:  Normal   DIAGNOSTIC DATA (LABS, IMAGING, TESTING) - I reviewed patient records, labs, notes, testing and imaging myself where available.  Lab Results  Component Value Date   WBC 5.6 12/10/2023   HGB 12.5 12/10/2023   HCT 35.2 (L) 12/10/2023   MCV 98.3 12/10/2023  PLT 431 (H) 12/10/2023      Component Value Date/Time   NA 131 (L) 12/16/2023 1026   NA 125 (L) 11/08/2023 1603   K 5.0 12/16/2023 1026   CL 94 (L) 12/16/2023 1026   CO2 29 12/16/2023 1026   GLUCOSE 87 12/16/2023 1026   BUN 8 12/16/2023 1026   BUN 2 (L) 11/08/2023 1603   CREATININE 0.39 (L) 12/16/2023 1026   CREATININE 0.49 (L) 11/29/2019 0926   CALCIUM 9.9 12/16/2023 1026   PROT 8.0 12/16/2023 1026   PROT 7.5 11/08/2023 1603   ALBUMIN 4.6 12/16/2023 1026   ALBUMIN 4.1 11/08/2023 1603   AST 33 12/16/2023 1026   ALT 24 12/16/2023 1026   ALKPHOS 197 (H) 12/16/2023 1026   BILITOT 0.2 12/16/2023 1026   BILITOT 0.2 11/08/2023 1603   GFRNONAA >60 12/16/2023 1026   GFRNONAA 105 11/29/2019 0926   GFRAA 120 02/27/2020 1508   GFRAA 122 11/29/2019 0926   Lab Results  Component Value Date   CHOL 159 07/14/2023   HDL 59 07/14/2023   LDLCALC 84 07/14/2023   TRIG 88 07/14/2023   Lab Results  Component Value Date   HGBA1C 5.6 02/27/2020   Lab Results  Component Value Date   VITAMINB12 469 08/12/2023   Lab Results  Component Value Date   TSH 1.460 12/16/2023    EEG 2013: Normal   EEG 2024: Left temporal sharps    ASSESSMENT AND  PLAN  63 y.o. year old female  with history of longstanding seizure disorder, anxiety/depression, recent fall with left hip fracture s/p surgery who is presenting for follow up.  Her recent EEG showed left temporal discharges. She continues to have breakthrough seizures described as word finding difficulty and aphasia followed by tiredness despite phenobarbital  200 mg nightly and Levetiracetam  2000 mg twice daily. She is now status post VNS implantation on January 16 , 2025.  She continues to report seizures but no convulsions.  Plan will be for patient to continue current medications, at next visit, if she continues to complain of seizures, we will add Depakote For hyponatremia, her most recent sodium level was still low at 131.  I did advise to continue the salt tablet and to follow-up with PCP.  I will see her in 6 months for follow-up if worse.     1. Localization-related symptomatic epilepsy and epileptic syndromes with complex partial seizures, intractable, without status epilepticus (HCC)   2. S/P placement of VNS (vagus nerve stimulation) device   3. Hyponatremia       Patient Instructions  Continue current antiseizure medications   Phenobarbital  200 mg nightly   Levetiracetam  2000 mg twice daily Continue the full tablet Will obtain a neck ultrasound due to neck mass At next visit in 6 months, if she continued to have seizures we will likely add Depakote (weight gain side effect) and increase her VNS settings Return sooner if worse   Per [place] statutes, patients with seizures are not allowed to drive until they have been seizure-free for six months.  Other recommendations include using caution when using heavy equipment or power tools. Avoid working on ladders or at heights. Take showers instead of baths.  Do not swim alone.  Ensure the water  temperature is not too high on the home water  heater. Do not go swimming alone. Do not lock yourself in a room alone (i.e.  bathroom). When caring for infants or small children, sit down when holding, feeding, or changing them to minimize  risk of injury to the child in the event you have a seizure. Maintain good sleep hygiene. Avoid alcohol.  Also recommend adequate sleep, hydration, good diet and minimize stress.   During the Seizure  - First, ensure adequate ventilation and place patients on the floor on their left side  Loosen clothing around the neck and ensure the airway is patent. If the patient is clenching the teeth, do not force the mouth open with any object as this can cause severe damage - Remove all items from the surrounding that can be hazardous. The patient may be oblivious to what's happening and may not even know what he or she is doing. If the patient is confused and wandering, either gently guide him/her away and block access to outside areas - Reassure the individual and be comforting - Call 911. In most cases, the seizure ends before EMS arrives. However, there are cases when seizures may last over 3 to 5 minutes. Or the individual may have developed breathing difficulties or severe injuries. If a pregnant patient or a person with diabetes develops a seizure, it is prudent to call an ambulance. - Finally, if the patient does not regain full consciousness, then call EMS. Most patients will remain confused for about 45 to 90 minutes after a seizure, so you must use judgment in calling for help. - Avoid restraints but make sure the patient is in a bed with padded side rails - Place the individual in a lateral position with the neck slightly flexed; this will help the saliva drain from the mouth and prevent the tongue from falling backward - Remove all nearby furniture and other hazards from the area - Provide verbal assurance as the individual is regaining consciousness - Provide the patient with privacy if possible - Call for help and start treatment as ordered by the caregiver   After the Seizure  (Postictal Stage)  After a seizure, most patients experience confusion, fatigue, muscle pain and/or a headache. Thus, one should permit the individual to sleep. For the next few days, reassurance is essential. Being calm and helping reorient the person is also of importance.  Most seizures are painless and end spontaneously. Seizures are not harmful to others but can lead to complications such as stress on the lungs, brain and the heart. Individuals with prior lung problems may develop labored breathing and respiratory distress.     No orders of the defined types were placed in this encounter.   No orders of the defined types were placed in this encounter.   Return in about 6 months (around 07/24/2024).  The patient's condition of epilepsy requires frequent monitoring and adjustments in the treatment plan, reflecting the ongoing complexity of care.  This provider is the continuing focal point for all needed services for this condition.   Pastor Falling, MD 01/24/2024, 3:48 PM  [facility] [address] [place], [place] [address] [phone]  "

## 2024-01-24 NOTE — Patient Instructions (Addendum)
 Continue current antiseizure medications   Phenobarbital  200 mg nightly   Levetiracetam  2000 mg twice daily Continue the full tablet Will obtain a neck ultrasound due to neck mass At next visit in 6 months, if she continued to have seizures we will likely add Depakote (weight gain side effect) and increase her VNS settings Return sooner if worse

## 2024-02-02 ENCOUNTER — Encounter: Payer: Self-pay | Admitting: Gastroenterology

## 2024-02-03 ENCOUNTER — Other Ambulatory Visit: Payer: Self-pay

## 2024-02-03 ENCOUNTER — Ambulatory Visit (INDEPENDENT_AMBULATORY_CARE_PROVIDER_SITE_OTHER)

## 2024-02-03 ENCOUNTER — Telehealth: Payer: Self-pay

## 2024-02-03 VITALS — BP 120/68 | HR 68 | Resp 12 | Ht 62.0 in | Wt 102.8 lb

## 2024-02-03 DIAGNOSIS — R32 Unspecified urinary incontinence: Secondary | ICD-10-CM

## 2024-02-03 DIAGNOSIS — Z780 Asymptomatic menopausal state: Secondary | ICD-10-CM | POA: Diagnosis not present

## 2024-02-03 DIAGNOSIS — R150 Incomplete defecation: Secondary | ICD-10-CM

## 2024-02-03 DIAGNOSIS — Z1211 Encounter for screening for malignant neoplasm of colon: Secondary | ICD-10-CM

## 2024-02-03 DIAGNOSIS — Z0000 Encounter for general adult medical examination without abnormal findings: Secondary | ICD-10-CM

## 2024-02-03 MED ORDER — UNABLE TO FIND: 5 refills | Status: AC

## 2024-02-03 MED ORDER — MOIST WIPES FLUSHABLE MISC: 5 refills | Status: AC

## 2024-02-03 NOTE — Progress Notes (Signed)
 "  HM Addressed: DEXA scheduled Referral sent to GI for colonoscopy Chief Complaint  Patient presents with   Medicare Wellness     Subjective:   Sandra Riley is a 64 y.o. female who presents for a Medicare Annual Wellness Visit.  Visit info / Clinical Intake: Medicare Wellness Visit Type:: Subsequent Annual Wellness Visit Persons participating in visit and providing information:: patient Medicare Wellness Visit Mode:: In-person (required for WTM) Interpreter Needed?: No Pre-visit prep was completed: yes AWV questionnaire completed by patient prior to visit?: no Living arrangements:: lives with spouse/significant other Patient's Overall Health Status Rating: (!) fair Typical amount of pain: some Does pain affect daily life?: no Are you currently prescribed opioids?: no  Dietary Habits and Nutritional Risks How many meals a day?: 2 Eats fruit and vegetables daily?: yes Most meals are obtained by: preparing own meals; having others provide food In the last 2 weeks, have you had any of the following?: none Diabetic:: no  Functional Status Activities of Daily Living (to include ambulation/medication): (!) Needs Assist Feeding: Independent Dressing/Grooming: Independent Bathing: Independent with device- listed below Toileting: Independent Transfer: Independent with device- listed below Ambulation: Independent with device- listed below Home Assistive Devices/Equipment: Walker (specify Type) (rollator) Medication Administration: Independent Home Management (perform basic housework or laundry): Needs assistance (comment) (patient has a home health aid starting on 02/13/2024) Manage your own finances?: (!) no Primary transportation is: family / friends Concerns about vision?: no *vision screening is required for WTM* Concerns about hearing?: no  Fall Screening Falls in the past year?: 1 Number of falls in past year: 1 Was there an injury with Fall?: 0 Fall Risk Category  Calculator: 2 Patient Fall Risk Level: Moderate Fall Risk  Fall Risk Patient at Risk for Falls Due to: History of fall(s); Impaired balance/gait; Impaired mobility Fall risk Follow up: Falls evaluation completed; Education provided; Falls prevention discussed  Home and Transportation Safety: All rugs have non-skid backing?: yes All stairs or steps have railings?: N/A, no stairs Grab bars in the bathtub or shower?: yes Have non-skid surface in bathtub or shower?: yes Good home lighting?: yes Regular seat belt use?: yes Hospital stays in the last year:: (!) yes How many hospital stays:: 2 Reason: vagal nerve stimulator implant, dizziness + seizures  Cognitive Assessment Difficulty concentrating, remembering, or making decisions? : no Will 6CIT or Mini Cog be Completed: yes What year is it?: 0 points What month is it?: 0 points Give patient an address phrase to remember (5 components): [address] [place] About what time is it?: 0 points Count backwards from 20 to 1: 0 points Say the months of the year in reverse: 0 points Repeat the address phrase from earlier: 0 points 6 CIT Score: 0 points  Advance Directives (For Healthcare) Does Patient Have a Medical Advance Directive?: No Would patient like information on creating a medical advance directive?: Yes (MAU/Ambulatory/Procedural Areas - Information given)  Reviewed/Updated  Reviewed/Updated: Reviewed All (Medical, Surgical, Family, Medications, Allergies, Care Teams, Patient Goals)    Allergies (verified) Aspirin, Nsaids, and Singulair  [montelukast  sodium]   Current Medications (verified) Outpatient Encounter Medications as of 02/03/2024  Medication Sig   acetaminophen  (TYLENOL ) 500 MG tablet Take 1,000 mg by mouth every 6 (six) hours as needed for moderate pain.   alendronate  (FOSAMAX ) 35 MG tablet TAKE 1 TABLET BY MOUTH ONCE WEEKLY IN THE MORNING with full GLASS of water  ON an EMPTY stomach DO not lay down FOR 30 mins    amLODipine  (  NORVASC ) 2.5 MG tablet TAKE 1 TABLET BY MOUTH DAILY   azelastine (ASTELIN) 0.1 % nasal spray Place 2 sprays into both nostrils 2 (two) times daily.   Calcium Carbonate (CALCIUM 500 PO) Take 1 tablet by mouth daily.   Camphor-Eucalyptus-Menthol  (VICKS VAPORUB EX) Apply 1 Application topically as needed (congestion.).   cetirizine  (ZYRTEC ) 10 MG tablet Take 1 tablet (10 mg total) by mouth daily.   clonazePAM  (KLONOPIN ) 1 MG tablet Take 1 tablet (1 mg total) by mouth 2 (two) times daily.   fluticasone  (FLONASE ) 50 MCG/ACT nasal spray Place 2 sprays into both nostrils daily.   Humidifier MISC Large humidifier.   levETIRAcetam  (KEPPRA ) 1000 MG tablet Take 2,000 mg by mouth 2 (two) times daily.   megestrol  (MEGACE ) 20 MG tablet Take 1 tablet (20 mg total) by mouth daily.   metoprolol  tartrate (LOPRESSOR ) 25 MG tablet TAKE 1 TABLET BY MOUTH TWICE DAILY   mirtazapine  (REMERON ) 45 MG tablet Take 1 tablet (45 mg total) by mouth at bedtime.   Multiple Vitamin (MULTIVITAMIN) tablet Take 1 tablet by mouth in the morning.   ondansetron  (ZOFRAN ) 4 MG tablet TAKE 1 TABLET BY MOUTH 3 TO 4 TIMES WEEKLY AS NEEDED FOR NAUSEA   pantoprazole  (PROTONIX ) 40 MG tablet Take 1 tablet (40 mg total) by mouth daily before breakfast.   PHENObarbital  (LUMINAL) 100 MG tablet TAKE 2 TABLETS BY MOUTH AT BEDTIME   potassium chloride  (KLOR-CON  M) 10 MEQ tablet TAKE 1 TABLET BY MOUTH TWICE DAILY   sodium chloride  1 g tablet Take 1 tablet (1 g total) by mouth 2 (two) times daily with a meal.   UNABLE TO FIND Med Name: Adult Diapers Size Small   No facility-administered encounter medications on file as of 02/03/2024.    History: Past Medical History:  Diagnosis Date   Abnormal facial hair 05/11/2011   Anxiety and depression    GAD (generalized anxiety disorder)    Headache    Heart murmur    ECHO 06/15/22: SAM of anterior MV leaflet with dynamic LVOT gradient in setting of moderate symmetrical LVH and  hyperdynamic LVF, Peak resting gradient mild at 24 mmHg   Hypertension    Insomnia    Psychotic disorder (HCC)    Seizure disorder (HCC)    stress related seizures. 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[facility])    Past Surgical History:  Procedure Laterality Date   ABDOMINAL HYSTERECTOMY     BREAST BIOPSY Left 08/24/2022   US  LT BREAST BX W LOC DEV 1ST LESION IMG BX SPEC US  GUIDE 08/24/2022 [facility]-ULTRASOUND   CESAREAN SECTION     CHOLECYSTECTOMY     COLONOSCOPY WITH PROPOFOL  N/A 02/15/2019   Procedure: COLONOSCOPY WITH PROPOFOL ;  Surgeon: Shaaron Lamar HERO, MD;  Location[facility];  Service: Endoscopy;  Laterality: N/A;  2:45pm   POLYPECTOMY  02/15/2019   Procedure: POLYPECTOMY;  Surgeon: Shaaron Lamar HERO, MD;  Location: [facility];  Service: Endoscopy;;   TOTAL HIP ARTHROPLASTY Left 01/27/2022   Procedure: TOTAL HIP ARTHROPLASTY ANTERIOR APPROACH;  Surgeon: Jerri Kay HERO, MD;  Location: [facility] OR;  Service: Orthopedics;  Laterality: Left;   VAGUS NERVE STIMULATOR INSERTION Left 02/17/2023   Procedure: VAGAL NERVE STIMULATOR IMPLANT;  Surgeon: Lanis Pupa, MD;  Location: [facility] OR;  Service: Neurosurgery;  Laterality: Left;  3C   Family History  Problem Relation Age of Onset   Breast cancer Mother 20   Coronary artery disease Father  Stroke Father 72   Breast cancer Sister 24   Breast cancer Sister    Breast cancer Sister    Diabetes Brother    Heart attack Brother    Colon cancer Neg Hx    Social History   Occupational History   Occupation: disabled  Tobacco Use   Smoking status: Never   Smokeless tobacco: Never  Vaping Use   Vaping status: Never Used  Substance and Sexual Activity   Alcohol use: No   Drug use: No   Sexual activity: Yes    Birth control/protection: Surgical   Tobacco Counseling Counseling given: Yes  SDOH Screenings   Food Insecurity: No Food Insecurity (02/03/2024)  Housing: Low Risk (02/03/2024)  Transportation Needs: No  Transportation Needs (02/03/2024)  Utilities: Not At Risk (02/03/2024)  Alcohol Screen: Low Risk (07/08/2022)  Depression (PHQ2-9): Low Risk (02/03/2024)  Financial Resource Strain: Low Risk (07/08/2022)  Physical Activity: Inactive (02/03/2024)  Social Connections: Moderately Integrated (02/03/2024)  Stress: No Stress Concern Present (02/03/2024)  Tobacco Use: Low Risk (02/03/2024)  Health Literacy: Adequate Health Literacy (02/03/2024)   See flowsheets for full screening details  Depression Screen PHQ 2 & 9 Depression Scale- Over the past 2 weeks, how often have you been bothered by any of the following problems? Little interest or pleasure in doing things: 0 Feeling down, depressed, or hopeless (PHQ Adolescent also includes...irritable): 1 (just down about the way the world is) PHQ-2 Total Score: 1 Trouble falling or staying asleep, or sleeping too much: 0 Feeling tired or having little energy: 0 Poor appetite or overeating (PHQ Adolescent also includes...weight loss): 0 Feeling bad about yourself - or that you are a failure or have let yourself or your family down: 0 Trouble concentrating on things, such as reading the newspaper or watching television (PHQ Adolescent also includes...like school work): 0 Moving or speaking so slowly that other people could have noticed. Or the opposite - being so fidgety or restless that you have been moving around a lot more than usual: 0 Thoughts that you would be better off dead, or of hurting yourself in some way: 0 PHQ-9 Total Score: 1 If you checked off any problems, how difficult have these problems made it for you to do your work, take care of things at home, or get along with other people?: Not difficult at all  Depression Treatment Depression Interventions/Treatment : Counseling     Goals Addressed               This Visit's Progress     I want to improve my health (pt-stated)               Objective:    Today's Vitals   02/03/24 0903  BP:  120/68  Pulse: 68  Resp: 12  SpO2: 99%  Weight: 102 lb 12.8 oz (46.6 kg)  Height: 5' 2 (1.575 m)   Body mass index is 18.8 kg/m.  Hearing/Vision screen Hearing Screening - Comments:: Patient denies any hearing difficulties.   Vision Screening - Comments:: Patient does not have an eye doctor. A list of eye doctors has been provided to the patient.   Immunizations and Health Maintenance Health Maintenance  Topic Date Due   Bone Density Scan  01/04/2021   Colonoscopy  02/15/2024   COVID-19 Vaccine (8 - Moderna risk 2025-26 season) 05/04/2024   Mammogram  08/16/2024   Medicare Annual Wellness (AWV)  09/03/2024   Cervical Cancer Screening (HPV/Pap Cotest)  10/21/2025   DTaP/Tdap/Td (4 -  Td or Tdap) 08/18/2032   Pneumococcal Vaccine: 50+ Years  Completed   Influenza Vaccine  Completed   Hepatitis C Screening  Completed   HIV Screening  Completed   Zoster Vaccines- Shingrix  Completed   Hepatitis B Vaccines 19-59 Average Risk  Aged Out   Meningococcal B Vaccine  Aged Out        Assessment/Plan:  This is a routine wellness examination for [facility].  Patient Care Team: Antonetta Rollene BRAVO, MD as PCP - General Frances, Ozell RAMAN, LCSW as [facility] Care Management (Licensed Clinical Social Worker) Gregg Lek, MD as Consulting Physician (Neurology) Stacia Diannah SQUIBB, MD as Consulting Physician (Cardiology) Palmer Reyes RIGGERS as Physician Assistant (Otolaryngology) Ezzard Sonny RAMAN, PA-C as Physician Assistant (Gastroenterology)  I have personally reviewed and noted the following in the patients chart:   Medical and social history Use of alcohol, tobacco or illicit drugs  Current medications and supplements including opioid prescriptions. Functional ability and status Nutritional status Physical activity Advanced directives List of other physicians Hospitalizations, surgeries, and ER visits in previous 12 months Vitals Screenings to include  cognitive, depression, and falls Referrals and appointments  Orders Placed This Encounter  Procedures   DG Bone Density    Standing Status:   Future    Expiration Date:   02/02/2025    Reason for Exam (SYMPTOM  OR DIAGNOSIS REQUIRED):   post menopausal estrogen deficient    Preferred imaging location?:   [facility]   Ambulatory referral to Gastroenterology    Referral Priority:   Routine    Referral Type:   Consultation    Referral Reason:   Specialty Services Required    Number of Visits Requested:   1   In addition, I have reviewed and discussed with patient certain preventive protocols, quality metrics, and best practice recommendations. A written personalized care plan for preventive services as well as general preventive health recommendations were provided to patient.   Ruperto Kiernan, CMA   02/03/2024   Return for [facility] Wellness Visit w/ Wellness Nurse on Wednesday February 06, 2025 at 8:40 am.  After Visit Summary: (In Person-Printed) AVS printed and given to the patient   "

## 2024-02-03 NOTE — Telephone Encounter (Signed)
 Was sent 11/25. Resent to [facility]

## 2024-02-03 NOTE — Telephone Encounter (Signed)
 Copied from CRM [id number]. Topic: General - Other >> Feb 03, 2024 10:25 AM Rosaria BRAVO wrote: Reason for CRM: Hayley called from [facility] reporting that the patient states she was instructed to have the pharmacy contact her PCP for Adult Pull ups. Please advise   [facility] [place] [facility], [place] - [address] [place] [address] Phone: [phone] Fax: [phone]

## 2024-02-03 NOTE — Patient Instructions (Addendum)
 Sandra Riley,  Thank you for taking the time for your Medicare Wellness Visit. I appreciate your continued commitment to your health goals. Please review the care plan we discussed, and feel free to reach out if I can assist you further.  Please note that Annual Wellness Visits do not include a physical exam. Some assessments may be limited, especially if the visit was conducted virtually. If needed, we may recommend an in-person follow-up with your provider.  Ongoing Care Seeing your primary care provider every 3 to 6 months helps us  monitor your health and provide consistent, personalized care.   1 year follow up for Medicare well visit: Wednesday February 06, 2025 @ 8:40 am with medicare wellness nurse in office  Referrals If a referral was made during today's visit and you haven't received any updates within two weeks, please contact the referred provider directly to check on the status.  Bone Density Appointment: February 27, 2024 at 12:30 pm at [facility]  STOP YOUR CALCIUM MEDICATION AND MULTI VITAMINS 48 HOURS BEFORE YOUR SCREENING  Orders Placed This Encounter  Procedures   DG Bone Density    Standing Status:   Future    Expiration Date:   02/02/2025    Reason for Exam (SYMPTOM  OR DIAGNOSIS REQUIRED):   post menopausal estrogen deficient    Preferred imaging location?:   [facility]   Ambulatory referral to Gastroenterology    Referral Priority:   Routine    Referral Type:   Consultation    Referral Reason:   Specialty Services Required    Number of Visits Requested:   1   Meds ordered this encounter  Medications   Incontinence Supply Disposable (MOIST WIPES FLUSHABLE) MISC    Sig: Use wipes after bowel movements for hygiene    Dispense:  1 each    Refill:  5   Recommended Screenings:  Health Maintenance  Topic Date Due   Osteoporosis screening with Bone Density Scan  01/04/2021   Colon Cancer Screening  02/15/2024   COVID-19 Vaccine (8 - Moderna risk 2025-26  season) 05/04/2024   Breast Cancer Screening  08/16/2024   Medicare Annual Wellness Visit  09/03/2024   Pap with HPV screening  10/21/2025   DTaP/Tdap/Td vaccine (4 - Td or Tdap) 08/18/2032   Pneumococcal Vaccine for age over 5  Completed   Flu Shot  Completed   Hepatitis C Screening  Completed   HIV Screening  Completed   Zoster (Shingles) Vaccine  Completed   Hepatitis B Vaccine  Aged Out   Meningitis B Vaccine  Aged Out       02/03/2024    9:06 AM  Advanced Directives  Does Patient Have a Medical Advance Directive? No  Would patient like information on creating a medical advance directive? Yes (MAU/Ambulatory/Procedural Areas - Information given)    Vision: Annual vision screenings are recommended for early detection of glaucoma, cataracts, and diabetic retinopathy. These exams can also reveal signs of chronic conditions such as diabetes and high blood pressure.  Dental: Annual dental screenings help detect early signs of oral cancer, gum disease, and other conditions linked to overall health, including heart disease and diabetes.  Please see the attached documents for additional preventive care recommendations.

## 2024-02-07 ENCOUNTER — Encounter (INDEPENDENT_AMBULATORY_CARE_PROVIDER_SITE_OTHER): Payer: Self-pay | Admitting: *Deleted

## 2024-02-08 ENCOUNTER — Ambulatory Visit: Payer: Self-pay | Admitting: Gastroenterology

## 2024-02-08 DIAGNOSIS — R748 Abnormal levels of other serum enzymes: Secondary | ICD-10-CM

## 2024-02-08 DIAGNOSIS — R112 Nausea with vomiting, unspecified: Secondary | ICD-10-CM

## 2024-02-08 DIAGNOSIS — E871 Hypo-osmolality and hyponatremia: Secondary | ICD-10-CM

## 2024-02-08 DIAGNOSIS — R634 Abnormal weight loss: Secondary | ICD-10-CM

## 2024-02-09 ENCOUNTER — Other Ambulatory Visit: Payer: Self-pay

## 2024-02-09 DIAGNOSIS — E875 Hyperkalemia: Secondary | ICD-10-CM

## 2024-02-09 DIAGNOSIS — E538 Deficiency of other specified B group vitamins: Secondary | ICD-10-CM

## 2024-02-09 DIAGNOSIS — R718 Other abnormality of red blood cells: Secondary | ICD-10-CM

## 2024-02-14 LAB — COMPREHENSIVE METABOLIC PANEL WITH GFR
ALT: 24 IU/L (ref 0–32)
AST: 24 IU/L (ref 0–40)
Albumin: 4.4 g/dL (ref 3.9–4.9)
Alkaline Phosphatase: 132 IU/L (ref 49–135)
BUN/Creatinine Ratio: 16 (ref 12–28)
BUN: 8 mg/dL (ref 8–27)
Bilirubin Total: 0.2 mg/dL (ref 0.0–1.2)
CO2: 21 mmol/L (ref 20–29)
Calcium: 10 mg/dL (ref 8.7–10.3)
Chloride: 100 mmol/L (ref 96–106)
Creatinine, Ser: 0.51 mg/dL — ABNORMAL LOW (ref 0.57–1.00)
Globulin, Total: 2.9 g/dL (ref 1.5–4.5)
Glucose: 88 mg/dL (ref 70–99)
Potassium: 5.4 mmol/L — ABNORMAL HIGH (ref 3.5–5.2)
Sodium: 134 mmol/L (ref 134–144)
Total Protein: 7.3 g/dL (ref 6.0–8.5)
eGFR: 104 mL/min/1.73

## 2024-02-14 LAB — CBC WITH DIFFERENTIAL/PLATELET
Basophils Absolute: 0.1 x10E3/uL (ref 0.0–0.2)
Basos: 1 %
EOS (ABSOLUTE): 0.1 x10E3/uL (ref 0.0–0.4)
Eos: 2 %
Hematocrit: 35.8 % (ref 34.0–46.6)
Hemoglobin: 12.1 g/dL (ref 11.1–15.9)
Immature Grans (Abs): 0 x10E3/uL (ref 0.0–0.1)
Immature Granulocytes: 0 %
Lymphocytes Absolute: 3.2 x10E3/uL — ABNORMAL HIGH (ref 0.7–3.1)
Lymphs: 56 %
MCH: 34.2 pg — ABNORMAL HIGH (ref 26.6–33.0)
MCHC: 33.8 g/dL (ref 31.5–35.7)
MCV: 101 fL — ABNORMAL HIGH (ref 79–97)
Monocytes Absolute: 0.8 x10E3/uL (ref 0.1–0.9)
Monocytes: 15 %
Neutrophils Absolute: 1.5 x10E3/uL (ref 1.4–7.0)
Neutrophils: 26 %
Platelets: 413 x10E3/uL (ref 150–450)
RBC: 3.54 x10E6/uL — ABNORMAL LOW (ref 3.77–5.28)
RDW: 11.3 % — ABNORMAL LOW (ref 11.7–15.4)
WBC: 5.7 x10E3/uL (ref 3.4–10.8)

## 2024-02-14 LAB — PRIMARY BILIARY CHOLANGITIS PR
Anti-GP-210 Ab (RDL): <20 U
Anti-Mitochondrial Ab by IFA: <1:20 {titer}
Anti-Mitochondrial M2 Ab (RDL): <20 U
Anti-Nuclear Ab by IFA (RDL): NEGATIVE
Anti-SP-100 Ab (RDL): <20 U
Anti-Smooth Muscle Ab by IFA: 1:40 {titer} — AB

## 2024-02-14 LAB — IRON,TIBC AND FERRITIN PANEL
Ferritin: 85 ng/mL (ref 15–150)
Iron Saturation: 28 % (ref 15–55)
Iron: 82 ug/dL (ref 27–139)
Total Iron Binding Capacity: 294 ug/dL (ref 250–450)
UIBC: 212 ug/dL (ref 118–369)

## 2024-02-14 LAB — CORTISOL-AM, BLOOD: Cortisol - AM: 14.6 ug/dL (ref 6.2–19.4)

## 2024-02-21 ENCOUNTER — Encounter: Payer: Self-pay | Admitting: Family Medicine

## 2024-02-21 ENCOUNTER — Ambulatory Visit: Admitting: Family Medicine

## 2024-02-21 ENCOUNTER — Other Ambulatory Visit (HOSPITAL_COMMUNITY): Payer: Self-pay | Admitting: Family Medicine

## 2024-02-21 VITALS — BP 189/84 | HR 75 | Resp 16 | Ht 62.0 in | Wt 103.0 lb

## 2024-02-21 DIAGNOSIS — F32A Depression, unspecified: Secondary | ICD-10-CM

## 2024-02-21 DIAGNOSIS — Z13220 Encounter for screening for lipoid disorders: Secondary | ICD-10-CM | POA: Diagnosis not present

## 2024-02-21 DIAGNOSIS — Z0001 Encounter for general adult medical examination with abnormal findings: Secondary | ICD-10-CM

## 2024-02-21 DIAGNOSIS — R42 Dizziness and giddiness: Secondary | ICD-10-CM

## 2024-02-21 DIAGNOSIS — Z1231 Encounter for screening mammogram for malignant neoplasm of breast: Secondary | ICD-10-CM

## 2024-02-21 DIAGNOSIS — E559 Vitamin D deficiency, unspecified: Secondary | ICD-10-CM | POA: Diagnosis not present

## 2024-02-21 DIAGNOSIS — F419 Anxiety disorder, unspecified: Secondary | ICD-10-CM

## 2024-02-21 DIAGNOSIS — I10 Essential (primary) hypertension: Secondary | ICD-10-CM | POA: Diagnosis not present

## 2024-02-21 MED ORDER — AMLODIPINE BESYLATE 5 MG PO TABS: 5.0000 mg | ORAL_TABLET | Freq: Every day | ORAL | 2 refills | Status: AC

## 2024-02-21 MED ORDER — CLONAZEPAM 1 MG PO TABS: 1.0000 mg | ORAL_TABLET | Freq: Two times a day (BID) | ORAL | 5 refills | Status: AC

## 2024-02-21 MED ORDER — MIRTAZAPINE 45 MG PO TABS: 45.0000 mg | ORAL_TABLET | Freq: Every day | ORAL | 5 refills | Status: AC

## 2024-02-21 NOTE — Progress Notes (Signed)
" ° ° °  Sandra Riley     MRN: [id number]      DOB: 12-25-1960  Chief Complaint  Patient presents with   Annual Exam    HPI: Patient is in for annual physical exam. Uncontrolled blood pressure is addressed  Recent labs,  are reviewed. Immunization is reviewed , and  updated if needed.   PE: BP (!) 189/84   Pulse 75   Resp 16   Ht 5' 2 (1.575 m)   Wt 103 lb 0.6 oz (46.7 kg)   SpO2 96%   BMI 18.85 kg/m   Pleasant  female, alert and oriented x 3. HEENT No facial trauma or asymetry. Sinuses non tender.  Extra occullar muscles intact.. External ears normal, . Neck: supple, no adenopathy,JVD or thyromegaly.No bruits.  Chest: Clear to ascultation bilaterally.No crackles or wheezes. Non tender to palpation   Cardiovascular system; Heart sounds normal,  S1 and  S2 ,no S3.  No murmur, or thrill.  Peripheral pulses normal.  Abdomen: Soft, non tender, no organomegaly or masses. No bruits. Bowel sounds normal. No guarding, tenderness or rebound.      Musculoskeletal exam: Full ROM of spine, hips , shoulders and knees. No deformity ,swelling or crepitus noted. No muscle wasting or atrophy.   Neurologic: Cranial nerves 2 to 12 intact. Power, tone ,sensation  normal throughout. No disturbance in gait. No tremor.  Skin: Intact, no ulceration, erythema , scaling or rash noted. Pigmentation normal throughout  Psych; Normal mood and affect. Judgement and concentration normal   Assessment & Plan:  Encounter for Medicare annual examination with abnormal findings Annual exam as documented. Immunization and cancer screening needs are specifically addressed at this visit.   Essential hypertension Uncontrolled, inc amlodipine  to 5 mg daily, nurse BP check in 3 weeks DASH diet and commitment to daily physical activity for a minimum of 30 minutes discussed and encouraged, as a part of hypertension management. The importance of attaining a healthy weight is also  discussed.     02/21/2024    1:19 PM 02/03/2024    9:03 AM 01/24/2024    3:15 PM 01/23/2024   10:30 AM 01/23/2024   10:27 AM 12/16/2023    9:52 AM 12/16/2023    9:11 AM  BP/Weight  Systolic BP 189 120 138 127 162 130 142  Diastolic BP 84 68 85 76 83 72 81  Wt. (Lbs) 103.04 102.8 101.5  100.4  99  BMI 18.85 kg/m2 18.8 kg/m2 18.56 kg/m2  18.36 kg/m2  18.11 kg/m2      "

## 2024-02-21 NOTE — Patient Instructions (Addendum)
 F/U in 2 months  Annual exam with Dr Tobie in 1 year  CMP , folic acid , B12, lipid today add to recent labs  Please schedule July 17 or after mammogram at checkout  Nurse bP check 03/06/2024 please schedule  Nurse print letter for colonoscopy and give to pt to complete NEED colonoscopy  Higher dose amlodipine   starting today, 5 mg amlodipine  once daily, message is sent today, take another amlodipine  2.5 mg tablet today and starting tomorrow  Ensure coupon or sample please give pt if we have Thanks for choosing [facility], we consider it a privelige to serve you.

## 2024-02-22 ENCOUNTER — Ambulatory Visit: Payer: Self-pay | Admitting: Family Medicine

## 2024-02-22 LAB — CMP14+EGFR
ALT: 23 IU/L (ref 0–32)
AST: 27 IU/L (ref 0–40)
Albumin: 4.4 g/dL (ref 3.9–4.9)
Alkaline Phosphatase: 141 IU/L — ABNORMAL HIGH (ref 49–135)
BUN/Creatinine Ratio: 10 — ABNORMAL LOW (ref 12–28)
BUN: 4 mg/dL — ABNORMAL LOW (ref 8–27)
Bilirubin Total: 0.2 mg/dL (ref 0.0–1.2)
CO2: 18 mmol/L — ABNORMAL LOW (ref 20–29)
Calcium: 9.5 mg/dL (ref 8.7–10.3)
Chloride: 99 mmol/L (ref 96–106)
Creatinine, Ser: 0.42 mg/dL — ABNORMAL LOW (ref 0.57–1.00)
Globulin, Total: 2.8 g/dL (ref 1.5–4.5)
Glucose: 96 mg/dL (ref 70–99)
Potassium: 4.7 mmol/L (ref 3.5–5.2)
Sodium: 129 mmol/L — ABNORMAL LOW (ref 134–144)
Total Protein: 7.2 g/dL (ref 6.0–8.5)
eGFR: 108 mL/min/1.73

## 2024-02-22 LAB — LIPID PANEL
Chol/HDL Ratio: 2.5 ratio (ref 0.0–4.4)
Cholesterol, Total: 156 mg/dL (ref 100–199)
HDL: 62 mg/dL
LDL Chol Calc (NIH): 76 mg/dL (ref 0–99)
Triglycerides: 100 mg/dL (ref 0–149)
VLDL Cholesterol Cal: 18 mg/dL (ref 5–40)

## 2024-02-22 LAB — FOLATE: Folate: 8.1 ng/mL

## 2024-02-22 LAB — VITAMIN B12: Vitamin B-12: 455 pg/mL (ref 232–1245)

## 2024-02-26 ENCOUNTER — Encounter: Payer: Self-pay | Admitting: Family Medicine

## 2024-02-26 ENCOUNTER — Other Ambulatory Visit: Payer: Self-pay | Admitting: Family Medicine

## 2024-02-26 DIAGNOSIS — E876 Hypokalemia: Secondary | ICD-10-CM

## 2024-02-26 NOTE — Assessment & Plan Note (Addendum)
 Uncontrolled, inc amlodipine  to 5 mg daily, nurse BP check in 3 weeks DASH diet and commitment to daily physical activity for a minimum of 30 minutes discussed and encouraged, as a part of hypertension management. The importance of attaining a healthy weight is also discussed.     02/21/2024    1:19 PM 02/03/2024    9:03 AM 01/24/2024    3:15 PM 01/23/2024   10:30 AM 01/23/2024   10:27 AM 12/16/2023    9:52 AM 12/16/2023    9:11 AM  BP/Weight  Systolic BP 189 120 138 127 162 130 142  Diastolic BP 84 68 85 76 83 72 81  Wt. (Lbs) 103.04 102.8 101.5  100.4  99  BMI 18.85 kg/m2 18.8 kg/m2 18.56 kg/m2  18.36 kg/m2  18.11 kg/m2

## 2024-02-26 NOTE — Assessment & Plan Note (Signed)
 Annual exam as documented. . Immunization and cancer screening needs are specifically addressed at this visit.

## 2024-02-27 ENCOUNTER — Other Ambulatory Visit (HOSPITAL_COMMUNITY)

## 2024-03-06 ENCOUNTER — Ambulatory Visit: Payer: Self-pay

## 2024-03-06 ENCOUNTER — Telehealth: Payer: Self-pay | Admitting: Neurology

## 2024-03-07 ENCOUNTER — Other Ambulatory Visit: Payer: Self-pay

## 2024-03-07 MED ORDER — PHENOBARBITAL 100 MG PO TABS: 200.0000 mg | ORAL_TABLET | Freq: Every day | ORAL | 0 refills | Status: DC

## 2024-03-07 MED ORDER — PHENOBARBITAL 100 MG PO TABS: 200.0000 mg | ORAL_TABLET | Freq: Every day | ORAL | 3 refills | Status: AC

## 2024-03-07 NOTE — Telephone Encounter (Addendum)
 Patient called to check on status of refill. Patient said called the pharmacy and they have not received the refill for   PHENObarbital  (LUMINAL) 100 MG tablet send to [facility]  Patient said do not have any more medication, pharmacy said if do not get a new prescription for refill will have to pay for the medication heart feeling like going jump out of my chest. Would like a call back as soon as

## 2024-03-07 NOTE — Telephone Encounter (Signed)
 Last visit: 01/24/24 Next visit: not scheduled

## 2024-03-07 NOTE — Telephone Encounter (Signed)
 Pt called to request call from MD pt stated Pharmacy informed Pt to call office to follow up about medication refill , Informed Pt that  [facility] is working on getting medication filled PHENObarbital  (LUMINAL) 100 MG tablet

## 2024-03-07 NOTE — Telephone Encounter (Signed)
 Last visit: 01/24/2024 Next visit: No scheduled

## 2024-03-07 NOTE — Addendum Note (Signed)
 Addended by: Ginna Schuur D on: 03/07/2024 08:10 AM   Modules accepted: Orders

## 2024-03-12 ENCOUNTER — Other Ambulatory Visit (HOSPITAL_COMMUNITY)

## 2024-03-28 ENCOUNTER — Ambulatory Visit: Admitting: Family Medicine

## 2024-04-17 ENCOUNTER — Ambulatory Visit: Payer: Self-pay | Admitting: Family Medicine

## 2024-08-17 ENCOUNTER — Ambulatory Visit (HOSPITAL_COMMUNITY)

## 2024-08-28 ENCOUNTER — Ambulatory Visit: Admitting: Neurology

## 2025-02-06 ENCOUNTER — Ambulatory Visit: Payer: Self-pay

## 2025-02-22 ENCOUNTER — Encounter: Payer: Self-pay | Admitting: Family Medicine
